# Patient Record
Sex: Male | Born: 1935 | Race: White | Hispanic: No | Marital: Married | Smoking: Former smoker
Health system: Southern US, Community
[De-identification: ages and names within clinical notes are randomized; demographics above are authoritative.]

## PROBLEM LIST (undated history)

## (undated) DIAGNOSIS — I10 Essential (primary) hypertension: Secondary | ICD-10-CM

## (undated) DIAGNOSIS — M1990 Unspecified osteoarthritis, unspecified site: Secondary | ICD-10-CM

## (undated) DIAGNOSIS — I499 Cardiac arrhythmia, unspecified: Secondary | ICD-10-CM

## (undated) DIAGNOSIS — I718 Aortic aneurysm of unspecified site, ruptured: Secondary | ICD-10-CM

## (undated) HISTORY — PX: OTHER SURGICAL HISTORY: SHX169

---

## 2021-08-22 ENCOUNTER — Emergency Department (HOSPITAL_COMMUNITY): Payer: Medicare Other

## 2021-08-22 ENCOUNTER — Inpatient Hospital Stay (HOSPITAL_COMMUNITY)
Admission: EM | Admit: 2021-08-22 | Discharge: 2021-08-24 | DRG: 065 | Disposition: A | Discharge status: Home Health | Payer: Medicare Other | Attending: Family Medicine | Admitting: Family Medicine

## 2021-08-22 ENCOUNTER — Ambulatory Visit (HOSPITAL_COMMUNITY)
Admission: EM | Admit: 2021-08-22 | Discharge: 2021-08-22 | Disposition: A | Discharge status: Home / Self Care | Payer: Medicare Other | Attending: Family Medicine | Admitting: Family Medicine

## 2021-08-22 ENCOUNTER — Encounter (HOSPITAL_COMMUNITY): Payer: Self-pay | Admitting: Emergency Medicine

## 2021-08-22 ENCOUNTER — Other Ambulatory Visit: Payer: Self-pay

## 2021-08-22 DIAGNOSIS — I6340 Cerebral infarction due to embolism of unspecified cerebral artery: Principal | ICD-10-CM | POA: Diagnosis present

## 2021-08-22 DIAGNOSIS — I10 Essential (primary) hypertension: Secondary | ICD-10-CM | POA: Diagnosis present

## 2021-08-22 DIAGNOSIS — E785 Hyperlipidemia, unspecified: Secondary | ICD-10-CM | POA: Diagnosis present

## 2021-08-22 DIAGNOSIS — R29701 NIHSS score 1: Secondary | ICD-10-CM | POA: Diagnosis present

## 2021-08-22 DIAGNOSIS — Z8673 Personal history of transient ischemic attack (TIA), and cerebral infarction without residual deficits: Secondary | ICD-10-CM

## 2021-08-22 DIAGNOSIS — Z7901 Long term (current) use of anticoagulants: Secondary | ICD-10-CM

## 2021-08-22 DIAGNOSIS — N400 Enlarged prostate without lower urinary tract symptoms: Secondary | ICD-10-CM | POA: Diagnosis present

## 2021-08-22 DIAGNOSIS — Z886 Allergy status to analgesic agent status: Secondary | ICD-10-CM

## 2021-08-22 DIAGNOSIS — I639 Cerebral infarction, unspecified: Secondary | ICD-10-CM | POA: Diagnosis present

## 2021-08-22 DIAGNOSIS — R479 Unspecified speech disturbances: Secondary | ICD-10-CM | POA: Diagnosis not present

## 2021-08-22 DIAGNOSIS — Z9109 Other allergy status, other than to drugs and biological substances: Secondary | ICD-10-CM

## 2021-08-22 DIAGNOSIS — Y92009 Unspecified place in unspecified non-institutional (private) residence as the place of occurrence of the external cause: Secondary | ICD-10-CM

## 2021-08-22 DIAGNOSIS — W010XXA Fall on same level from slipping, tripping and stumbling without subsequent striking against object, initial encounter: Secondary | ICD-10-CM | POA: Diagnosis present

## 2021-08-22 DIAGNOSIS — G252 Other specified forms of tremor: Secondary | ICD-10-CM | POA: Diagnosis present

## 2021-08-22 DIAGNOSIS — I480 Paroxysmal atrial fibrillation: Secondary | ICD-10-CM | POA: Diagnosis present

## 2021-08-22 DIAGNOSIS — R4701 Aphasia: Secondary | ICD-10-CM | POA: Diagnosis present

## 2021-08-22 DIAGNOSIS — S0990XA Unspecified injury of head, initial encounter: Secondary | ICD-10-CM

## 2021-08-22 DIAGNOSIS — Z8679 Personal history of other diseases of the circulatory system: Secondary | ICD-10-CM

## 2021-08-22 DIAGNOSIS — H02401 Unspecified ptosis of right eyelid: Secondary | ICD-10-CM | POA: Diagnosis present

## 2021-08-22 DIAGNOSIS — M170 Bilateral primary osteoarthritis of knee: Secondary | ICD-10-CM | POA: Diagnosis present

## 2021-08-22 DIAGNOSIS — G8191 Hemiplegia, unspecified affecting right dominant side: Secondary | ICD-10-CM | POA: Diagnosis present

## 2021-08-22 DIAGNOSIS — R296 Repeated falls: Secondary | ICD-10-CM | POA: Diagnosis present

## 2021-08-22 DIAGNOSIS — Z79899 Other long term (current) drug therapy: Secondary | ICD-10-CM

## 2021-08-22 HISTORY — DX: Aortic aneurysm of unspecified site, ruptured: I71.8

## 2021-08-22 HISTORY — DX: Unspecified osteoarthritis, unspecified site: M19.90

## 2021-08-22 HISTORY — DX: Essential (primary) hypertension: I10

## 2021-08-22 HISTORY — DX: Cardiac arrhythmia, unspecified: I49.9

## 2021-08-22 LAB — CBC WITH DIFFERENTIAL/PLATELET
Abs Immature Granulocytes: 0.03 10*3/uL (ref 0.00–0.07)
Basophils Absolute: 0.1 10*3/uL (ref 0.0–0.1)
Basophils Relative: 1 %
Eosinophils Absolute: 0.2 10*3/uL (ref 0.0–0.5)
Eosinophils Relative: 3 %
HCT: 39.8 % (ref 39.0–52.0)
Hemoglobin: 13.1 g/dL (ref 13.0–17.0)
Immature Granulocytes: 1 %
Lymphocytes Relative: 16 %
Lymphs Abs: 1.1 10*3/uL (ref 0.7–4.0)
MCH: 31.9 pg (ref 26.0–34.0)
MCHC: 32.9 g/dL (ref 30.0–36.0)
MCV: 96.8 fL (ref 80.0–100.0)
Monocytes Absolute: 0.6 10*3/uL (ref 0.1–1.0)
Monocytes Relative: 9 %
Neutro Abs: 4.6 10*3/uL (ref 1.7–7.7)
Neutrophils Relative %: 70 %
Platelets: 192 10*3/uL (ref 150–400)
RBC: 4.11 MIL/uL — ABNORMAL LOW (ref 4.22–5.81)
RDW: 13.1 % (ref 11.5–15.5)
WBC: 6.5 10*3/uL (ref 4.0–10.5)
nRBC: 0 % (ref 0.0–0.2)

## 2021-08-22 LAB — BASIC METABOLIC PANEL
Anion gap: 11 (ref 5–15)
BUN: 27 mg/dL — ABNORMAL HIGH (ref 8–23)
CO2: 27 mmol/L (ref 22–32)
Calcium: 10.8 mg/dL — ABNORMAL HIGH (ref 8.9–10.3)
Chloride: 102 mmol/L (ref 98–111)
Creatinine, Ser: 1.23 mg/dL (ref 0.61–1.24)
GFR, Estimated: 55 mL/min — ABNORMAL LOW (ref >60)
Glucose, Bld: 104 mg/dL — ABNORMAL HIGH (ref 70–99)
Potassium: 4.7 mmol/L (ref 3.5–5.1)
Sodium: 140 mmol/L (ref 135–145)

## 2021-08-22 NOTE — ED Notes (Signed)
Patient is being discharged from the Urgent Care and sent to the Emergency Department via wheelchair . Per Dr Tracie Harrier, patient is in need of higher level of care due to neuro symptoms. Patient is aware and verbalizes understanding of plan of care.  ?Vitals:  ? 08/22/21 1500  ?BP: (!) 129/53  ?Pulse: (!) 58  ?Resp: 18  ?Temp: (!) 97.5 ?F (36.4 ?C)  ?SpO2: 100%  ?  ?

## 2021-08-22 NOTE — ED Triage Notes (Signed)
Pt just moved here from [name] and does not have complete medical records as of yet.  Does take a pill for "fluid" and takes xarelto.  Had a fall approximately 5 days ago hitting head and 3 days ago began having expressive aphasia per family.   ?

## 2021-08-22 NOTE — ED Provider Notes (Signed)
?[facility] ?Provider Note ? ? ?CSN: [id number] ?Arrival date & time: 08/22/21  1533 ? ?  ? ?History ? ?Chief Complaint  ?Patient presents with  ? Fall  ? ? ?Zachary Underwood is a 88 y.o. male. ? ?88 yo M with a cc of episodes where he speaks gibberish and has some difficulty with coordination with his right hand.  This been going on for about 4 to 5 days now.  Has had multiple episodes.  His wife thinks that it is due to increased stress and states that usually it happens when he is in a stressful moment.  The difficulty with speech usually lasts for about a minute or so and then resolves.  He is also noticed that he has trouble with right hand coordination that last a bit longer has some trouble with gripping things and is dropped a few things.  Had an episode the day after the fall and then since then has had them a little bit more frequently.  Not having any symptoms currently. ? ? ?Fall ? ? ?  ? ?Home Medications ?Prior to Admission medications   ?Medication Sig Start Date End Date Taking? Authorizing Provider  ?CALCIUM PO Take 300 mg by mouth daily.   Yes [provider]  ?diazepam (VALIUM) 5 MG tablet Take 2.5 mg by mouth daily.   Yes [provider]  ?Ferrous Sulfate (IRON PO) Take 25 mg by mouth daily.   Yes [provider]  ?ibuprofen (ADVIL) 200 MG tablet Take 200 mg by mouth every 6 (six) hours as needed for mild pain (or "to thin the blood").   Yes [provider]  ?metolazone (ZAROXOLYN) 2.5 MG tablet Take 2.5 mg by mouth daily.   Yes [provider]  ?OVER THE COUNTER MEDICATION Take 5 mg by mouth See admin instructions. Stator 5 mg - Take 5 mg by mouth at bedtime   Yes [provider]  ?OVER THE COUNTER MEDICATION Take 8 mg by mouth See admin instructions. Candor 8 mg- Take 8 mg by mouth once a day   Yes [provider]  ?OVER THE COUNTER MEDICATION Take 10 mg by mouth See admin instructions. Lercapress  10 mg- Take 10 mg by mouth once a day   Yes [provider]  ?tamsulosin (FLOMAX) 0.4 MG CAPS capsule Take 0.4 mg by mouth at bedtime.   Yes [provider]  ?XARELTO 15 MG TABS tablet Take 15 mg by mouth in the morning.   Yes [provider]  ?   ? ?Allergies    ?Other and Tylenol [acetaminophen]   ? ?Review of Systems   ?Review of Systems ? ?Physical Exam ?Updated Vital Signs ?BP (!) 147/72   Pulse (!) 45   Temp 98 ?F (36.7 ?C) (Oral)   Resp 16   SpO2 97%  ?Physical Exam ?Vitals and nursing note reviewed.  ?Constitutional:   ?   Appearance: He is well-developed.  ?HENT:  ?   Head: Normocephalic and atraumatic.  ?Eyes:  ?   Pupils: Pupils are equal, round, and reactive to light.  ?Neck:  ?   Vascular: No JVD.  ?Cardiovascular:  ?   Rate and Rhythm: Normal rate and regular rhythm.  ?   Heart sounds: No murmur heard. ?  No friction rub. No gallop.  ?Pulmonary:  ?   Effort: No respiratory distress.  ?   Breath sounds: No wheezing.  ?Abdominal:  ?   General: There  is no distension.  ?   Tenderness: There is no abdominal tenderness. There is no guarding or rebound.  ?Musculoskeletal:     ?   General: Normal range of motion.  ?   Cervical back: Normal range of motion and neck supple.  ?Skin: ?   Coloration: Skin is not pale.  ?   Findings: No rash.  ?Neurological:  ?   Mental Status: He is alert and oriented to person, place, and time.  ?   GCS: GCS eye subscore is 4. GCS verbal subscore is 5. GCS motor subscore is 6.  ?   Sensory: Sensation is intact.  ?   Motor: Motor function is intact.  ?   Coordination: Coordination is intact.  ?   Comments: Anisocoria.  Left lower extremity weakness compared to right likely secondary due to pain.  Otherwise benign neurologic exam.  ?Psychiatric:     ?   Behavior: Behavior normal.  ? ? ?ED Results / Procedures / Treatments   ?Labs ?(all labs ordered are listed, but only abnormal results are displayed) ?Labs Reviewed  ?BASIC METABOLIC PANEL - Abnormal;  Notable for the following components:  ?    Result Value  ? Glucose, Bld 104 (*)   ? BUN 27 (*)   ? Calcium 10.8 (*)   ? GFR, Estimated 55 (*)   ? All other components within normal limits  ?CBC WITH DIFFERENTIAL/PLATELET - Abnormal; Notable for the following components:  ? RBC 4.11 (*)   ? All other components within normal limits  ? ? ?EKG ?None ? ?Radiology ?CT Head Wo Contrast ? ?Result Date: 08/22/2021 ?CLINICAL DATA:  [place] 5 days ago, anticoagulated, altered level of consciousness EXAM: CT HEAD WITHOUT CONTRAST TECHNIQUE: Contiguous axial images were obtained from the base of the skull through the vertex without intravenous contrast. RADIATION DOSE REDUCTION: This exam was performed according to the departmental dose-optimization program which includes automated exposure control, adjustment of the mA and/or kV according to patient size and/or use of iterative reconstruction technique. COMPARISON:  None Available. FINDINGS: Brain: No acute infarct or hemorrhage. Encephalomalacia left cerebellar hemisphere consistent with prior infarct. Lateral ventricles and midline structures are unremarkable. No acute extra-axial fluid collections. No mass effect. Diffuse cerebral atrophy is age appropriate. Vascular: No hyperdense vessel or unexpected calcification. Skull: Normal. Negative for fracture or focal lesion. Sinuses/Orbits: No acute finding. Other: None. IMPRESSION: 1. No acute intracranial process. Electronically Signed   By: Randa Ngo M.D.   On: 08/22/2021 17:00  ? ?MR BRAIN WO CONTRAST ? ?Result Date: 08/22/2021 ?CLINICAL DATA:  Fall.  Altered mental status. EXAM: MRI HEAD WITHOUT CONTRAST TECHNIQUE: Multiplanar, multiecho pulse sequences of the brain and surrounding structures were obtained without intravenous contrast. COMPARISON:  None Available. FINDINGS: Brain: Multiple small foci of acute/early subacute ischemia within the anterior left parietal lobe. There is an old left cerebellar infarct. No acute or  chronic hemorrhage. Normal white matter signal. Generalized volume loss. The midline structures are normal. Vascular: Major flow voids are preserved. Skull and upper cervical spine: Normal calvarium and skull base. Visualized upper cervical spine and soft tissues are normal. Sinuses/Orbits:No paranasal sinus fluid levels or advanced mucosal thickening. No mastoid or middle ear effusion. Normal orbits. IMPRESSION: 1. Multiple small foci of acute/early subacute ischemia within the anterior left parietal lobe. No hemorrhage or mass effect. 2. Old left cerebellar infarct. Electronically Signed   By: Ulyses Jarred M.D.   On: 08/22/2021 21:50   ? ?Procedures ?Procedures  ? ? ?  Medications Ordered in ED ?Medications - No data to display ? ?ED Course/ Medical Decision Making/ A&P ?  ?                        ?Medical Decision Making ?Amount and/or Complexity of Data Reviewed ?Radiology: ordered. ? ?Risk ?Decision regarding hospitalization. ? ? ?88 yo M with a chief complaints of difficulty speaking and right upper extremity coordination difficulties.  This has been off and on since he fell about 5 days ago.  Basic blood work performed here without significant anemia no significant electrolyte abnormality.  CT of the head negative for acute intracranial hemorrhage. ? ?Patient is recently moved from [name].  Has a history of hypertension hyperlipidemia.  Has a history of A-fib is on Xarelto.  He has had an echocardiogram reportedly recently that was unremarkable. ? ?His story is somewhat concerning for TIA.  I discussed case with Dr.  Leonel Ramsay, neurology he recommended a CT angiogram of the head and neck and an MRI of the brain.  If these are negative for acute pathology then he thought the patient could likely follow-up as an outpatient. ? ?MRI is resulted and shows multiple acute to subacute infarcts. ? ?We will discuss with medicine for admission. ? ?The patients results and plan were reviewed and discussed.   ?Any x-rays  performed were independently reviewed by myself.  ? ?Differential diagnosis were considered with the presenting HPI. ? ?Medications - No data to display ? ?Vitals:  ? 08/22/21 1850 08/22/21 1945 05/02/2

## 2021-08-22 NOTE — ED Provider Triage Note (Signed)
Emergency Medicine Provider Triage Evaluation Note ? ?Zachary Underwood , a 88 y.o. male  was evaluated in triage.  Family member.  Pt complains of recent fall 7 days ago, hit head, no LOC.  Intermittent expressive aphasia starting 2 days after the fall.  No other neurodeficits or complaints at this time.  States patient understands he is experiencing the aphasia and becomes frustrated.  Patient's family member notes that with administration of small dose of Xanax, the patient's symptoms seem to relieve themselves.  Is concerned about a intracranial bleed.  Denies fever, facial asymmetry, abnormal eye movements.  Notes chronic disequilibrium. ? ?Review of Systems  ?Positive: As above ?Negative: As above ? ?Physical Exam  ?BP 126/75 (BP Location: Right Arm)   Pulse (!) 51   Temp 98 ?F (36.7 ?C) (Oral)   Resp 14   SpO2 98%  ?Gen:   Awake, no distress   ?Resp:  Normal effort CTAB ?MSK:   Moves extremities without difficulty ?Other:  Strength, coordination, sensation appears grossly intact.  Vision grossly aligned appropriately.  Radial and PT pulses 2+ bilaterally.  Overall neuro exam unremarkable.  No facial asymmetry.  No expressive aphasia. ? ?Medical Decision Making  ?Medically screening exam initiated at 3:57 PM.  Appropriate orders placed.  Zachary Underwood was informed that the remainder of the evaluation will be completed by another provider, this initial triage assessment does not replace that evaluation, and the importance of remaining in the ED until their evaluation is complete. ? ?Labs, imaging ordered ?  ?Prince Rome, PA-C ?[id number] ? ?

## 2021-08-22 NOTE — ED Provider Notes (Addendum)
?  [facility] ? ? ?[id number] ?08/22/21 Arrival Time: [id number] ? ?ASSESSMENT & PLAN: ? ?1. Speech disturbance, unspecified type   ?2. Acute head injury without loss of consciousness, initial encounter   ? ?Cannot r/o intracranial insult here. Speech is normal here. Discussed. Agrees to ED evaluation. By private vehicle. Stable at d/c. ? ?Reviewed expectations re: course of current medical issues. Questions answered. ?Outlined signs and symptoms indicating need for more acute intervention. ?Patient verbalized understanding. ?After Visit Summary given. ? ? ?SUBJECTIVE: ?History from patient and family. ?Zachary Underwood is a 88 y.o. male who reports fall approx 4 d ago; did hit head; witnessed; no LOC. Two days later he and wife report him "speaking gibberish"; transient; no specific aphasia. Lasts sev minutes then resolves. Otherwise no neurological symptoms. No HA or visual changes. Wife reports speech abnormality has become more frequent yest and today. Ambulatory. No extremity sensation changes or weakness. No h/o stroke or TIA. ?Normal PO intake without n/v/d. ? ?Recently moved here from [name]. ? ?Does take "low-dose" Xanax for stress; is out.  ? ?Social History  ? ?Substance and Sexual Activity  ?Alcohol Use Never  ? ?Social History  ? ?Tobacco Use  ?Smoking Status Never  ?Smokeless Tobacco Never  ? ? ? ?OBJECTIVE: ? ?Vitals:  ? 08/22/21 1500  ?BP: (!) 129/53  ?Pulse: (!) 58  ?Resp: 18  ?Temp: (!) 97.5 ?F (36.4 ?C)  ?TempSrc: Oral  ?SpO2: 100%  ?  ?General appearance: alert; no distress ?Eyes: PERRLA; EOMI; conjunctiva normal ?HENT: normocephalic; atraumatic;  oral mucosa normal ?Neck: supple with FROM ?Lungs: unlabored ?Heart: regular ?Extremities: no cyanosis or edema; symmetrical with no gross deformities ?Skin: warm and dry ?Neurologic: normal gait; CT 2-12 grossly intact; normal extremity strength and sensation; speech is clear with slurring of words ?Psychological: alert and cooperative; normal mood and  affect ? ?No Known Allergies ? ?Past Medical History:  ?Diagnosis Date  ? Arthritis   ? Cardiac arrhythmia   ? Hypertension   ? 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 ?Social History  ? ?Socioeconomic History  ? Marital status: Married  ?  Spouse name: Not on file  ? Number of children: Not on file  ? Years of education: Not on file  ? Highest education level: Not on file  ?Occupational History  ? Not on file  ?Tobacco Use  ? Smoking status: Never  ? Smokeless tobacco: Never  ?Vaping Use  ? Vaping Use: Never used  ?Substance and Sexual Activity  ? Alcohol use: Never  ? Drug use: Never  ? Sexual activity: Not on file  ?Other Topics Concern  ? Not on file  ?Social History Narrative  ? Not on file  ? ?Social Determinants of Health  ? ?Financial Resource Strain: Not on file  ?Food Insecurity: Not on file  ?Transportation Needs: Not on file  ?Physical Activity: Not on file  ?Stress: Not on file  ?Social Connections: Not on file  ?Intimate Partner Violence: Not on file  ? ?History reviewed. No pertinent family history. ?Past Surgical History:  ?Procedure Laterality Date  ? open heart surgery    ? ruptured aortic aneurysm repair    ? ? ? ?  ?Vanessa Kick, MD ?08/22/21 1540 ? ?  ?Vanessa Kick, MD ?08/22/21 1542 ? ?

## 2021-08-22 NOTE — ED Triage Notes (Addendum)
Onset 4 days ago of noticing he is speaking "gibberish".  Patient states he is fully aware when he is doing this.  Patient has moved here approximately one week ago.  Have not secured pcp in area as of yet.  Patient has noticed he has been dropping items recently.  Feeling weaker than usual ? ?Patient had a fall one week ago.  Family member rep[orts patient did hit his head.  Patient is on blood thinners-xarelto ?

## 2021-08-22 NOTE — H&P (Addendum)
Family Medicine Teaching Service ?Hospital Admission History and Physical ?Service Pager: [phone] ? ?Patient name: Zachary Underwood Medical record number: [id number] ?Date of birth: May 10, 1927 Age: 88 y.o. Gender: male ? ?Primary Care Provider: Pcp, No ?Consultants: Neurology ?Code Status: Full code ?Preferred Emergency Contact: spouse, Dr. Mikeal Hawthorne, [phone]  ? ?Chief Complaint: aphasia and right hand weakness ? ?Zachary Underwood is a 88 y.o. male presenting with aphasia and right hand weakness, found to have acute/subacute strokes. PMH is significant for atrial fibrillation, h/o AAA s/p repair, osteoarthritis, HTN, HLD, and possibly CHF. ? ?Acute/ Subacute Strokes ?Patient presents with aphasia and weakness that began after fall at home. On admission, labs were unremarkable with a GFR 55, BUN of 27, WBC 6.5, Hgb 13.1. Patient had CT head that showed no acute intracranial process. MRI brain showing multiple small foci of acute/early subacute ischemia within the anterior left parietal lobe, old left cerebellar infarct, and no hemorrhage or mass effect. CT angiogram showed no intracranial arterial occlusion or high-grade stenosis, with bilateral carotid bifurcation atherosclerosis resulting in 50 to 65% stenosis of the proximal internal carotid arteries bilaterally.  On physical exam patient had mild right eye ptosis, reported as chronic, intact sensation and strength grossly, with no focal neurological deficits appreciated.  Given presentation and MRI findings patient has suffered a stroke.  Etiology of stroke unknown at this time, though he does have risk factors such as a h/o HLD, HTN, and atrial fibrillation, though on full dose anticoagulation.  Neurology consulted in the ED and following. ?- Admit to FPTS, attending Dr. Sherren Mocha McDiarmid ?- Neurology following appreciate recs ?- Vitals per unit ?- Neurochecks every 4 hours ?- Continuous cardiac monitoring ?- AM TSH, A1c, lipid panel,  BMP ?- Echocardiogram ?- EKG ?- Fall precautions ?- Up with assistance ?- PT, OT, SLP ?- Diet vegetarian or Kosher ?- DVT ppx with full dose anticoagulation xarelto ? ?Atrial fibrillation ?Patient with history of atrial fibrillation, treated with Xarelto.  Patient heart rate normal and regular on exam, low concern for A-fib at this time. Patient reports that he has not missed any doses of xarelto. We will continue home meds. ?- Continue Xarelto  ?- AM EKG ? ?Possible Heart Failure ?Patient with history of hospitalization for pitting leg edema and follows with cardiology. History from patient unclear, but appears as though patient may have heart failure, as at one point he took Lasix equivalent diuretic and is now taking metolazone. Patient has notable 3/6 murmur in RUSB with crescendo, concern for AS, though reports no prodrome or dizziness with exertion.  ?- Echocardiogram ?- Continue metolazone ?- Continuous cardiac monitoring ? ?HTN, H/o AAA ?Patient with hx of ruptured AAA in 2011, and HTN. Patient taking calcium channel blocker called lercanidipine, ARB called candesartan, and combined ACE inhibitor/statin called stator.  Will consider starting hospital formulary alternatives, with CCB, and either ACE or ARB. Will allow for permissive hypertension at this time and re-evaluate the use of home antihypertensives later.  ?-Consider starting formulary alternative to home meds ?-Continue to monitor BP ? ?HLD ?Patient on combined statin/ACE for home med. Unsure ASCVD risk, will obtain lipid panel ?-Lipid panel ?-Consider statin formulary alternative ? ?BPH ?Unclear medical history, however patient taking Flomax as home med. ?-Continue Flomax ? ?OA ?Patient with hx of OA in right knee. Will continue to monitor.  ? ?FEN/GI: Vegetarian diet ?Prophylaxis: xarelto full anticoagulation ? ?Disposition: Admit to FPTS, med telemetry ? ?History of Present Illness:  Zachary Remme  Underwood is a 88 y.o. male presenting with aphasia and  right hand weakness, found to have acute/subacute strokes. He is right hand dominant. ? ?6 days ago patient was carrying too many items across the threshold and lost his balance and fell. He denied any dizziness or symptoms prior to fall. He hit his head and injured his left shoulder and right leg, soreness has gone away. Two days after the fall his wife noted he slept a lot. The fourth day he started having aphasia and speaking gibberish for about 10-30 seconds, then was able to speak normally. He was aware his speech was not normal. He was coherent and then began having word searching difficulty again. 10 minutes later he returned to his normal self. On Day 5 (yesterday), he had a repeat of the limited episode of gibberish, he was again aware of the abnormality. He also noticed yesterday that his right hand was weak and he had trouble gripping objects in his right hand. In the last two days he has had bilateral leg weakness that is new that made it difficult for him to climb stairs. He has left knee OA that was noticeable for about the last 6 months. He uses betamethasone cream for his left knee OA.  ? 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 He and his wife moved from [name] to [place] in the last week. They are vegetarian/Kosher. They do not yet have a car, they have an appointment to establish care on May 11th at [facility].  ? ?He is not a smoker, he drinks 1 to 2 sips of wine at shabbas dinner, no recreational drugs. No n/v/d, no chest pain, no fevers.  ? ?Review Of Systems: Per HPI with the following additions:  ? ?Review of Systems  ?Constitutional:  Negative for fever.  ?Cardiovascular:  Negative for chest pain.  ?Gastrointestinal:  Positive for constipation. Negative for diarrhea, nausea and vomiting.  ?All other systems reviewed and are negative.  ? ?There are no problems to display for this patient. ? ? ?Past Medical History: ?Past Medical History:  ?Diagnosis Date   ? Arthritis   ? Cardiac arrhythmia   ? Hypertension   ? 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 ? ?Past Surgical History: ?Past Surgical History:  ?Procedure Laterality Date  ? open heart surgery    ? ruptured aortic aneurysm repair    ? ?Surgery: tonsillectomy, b/l hernia repair, 2011 ruptured AAA,  ?Social History: ?Social History  ? ?Tobacco Use  ? Smoking status: Never  ? Smokeless tobacco: Never  ?Vaping Use  ? Vaping Use: Never used  ?Substance Use Topics  ? Alcohol use: Never  ? Drug use: Never  ? ?Additional social history: see above   ?Please also refer to relevant sections of EMR. ? ?Family History: ?No family history on file. ? ? ?Allergies and Medications: ?Allergies  ?Allergen Reactions  ? Other Other (See Comments) and Hypertension  ?  Fusid- Legs became very swollen  ? Tylenol [Acetaminophen] Hives  ? ?No current facility-administered medications on file prior to encounter.  ? ?Current Outpatient Medications on File Prior to Encounter  ?Medication Sig Dispense Refill  ? CALCIUM PO Take 300 mg by mouth daily.    ? diazepam (VALIUM) 5 MG tablet Take 2.5 mg by mouth daily.    ? Ferrous Sulfate (IRON PO) Take 25 mg by mouth daily.    ? ibuprofen (ADVIL) 200 MG tablet Take 200 mg by mouth every 6 (  six) hours as needed for mild pain (or "to thin the blood").    ? metolazone (ZAROXOLYN) 2.5 MG tablet Take 2.5 mg by mouth daily.    ? OVER THE COUNTER MEDICATION Take 5 mg by mouth See admin instructions. Stator 5 mg - Take 5 mg by mouth at bedtime    ? OVER THE COUNTER MEDICATION Take 8 mg by mouth See admin instructions. Candor 8 mg- Take 8 mg by mouth once a day    ? OVER THE COUNTER MEDICATION Take 10 mg by mouth See admin instructions. Lercapress 10 mg- Take 10 mg by mouth once a day    ? tamsulosin (FLOMAX) 0.4 MG CAPS capsule Take 0.4 mg by mouth at bedtime.    ? XARELTO 15 MG TABS tablet Take 15 mg by mouth in the morning.    ? ? ?Objective: ?BP (!) 139/58   Pulse 75   Temp 98 ?F (36.7 ?C) (Oral)   Resp  13   SpO2 100%  ?Exam: ?General: Elderly, frail, polite, NAD, white male ?Eyes: EOMI, clear conjunctivo- ?ENTM: MMM ?Neck: Soft, no cervical lymphadenopathy ?Cardiovascular: Regular rate, 3 out of 6 syst

## 2021-08-23 ENCOUNTER — Emergency Department (HOSPITAL_COMMUNITY): Payer: Medicare Other

## 2021-08-23 ENCOUNTER — Observation Stay (HOSPITAL_BASED_OUTPATIENT_CLINIC_OR_DEPARTMENT_OTHER): Payer: Medicare Other

## 2021-08-23 ENCOUNTER — Encounter (HOSPITAL_COMMUNITY): Payer: Self-pay | Admitting: Family Medicine

## 2021-08-23 DIAGNOSIS — I6389 Other cerebral infarction: Secondary | ICD-10-CM | POA: Diagnosis not present

## 2021-08-23 DIAGNOSIS — I6340 Cerebral infarction due to embolism of unspecified cerebral artery: Secondary | ICD-10-CM

## 2021-08-23 DIAGNOSIS — I4891 Unspecified atrial fibrillation: Secondary | ICD-10-CM | POA: Diagnosis not present

## 2021-08-23 DIAGNOSIS — I639 Cerebral infarction, unspecified: Secondary | ICD-10-CM | POA: Diagnosis not present

## 2021-08-23 DIAGNOSIS — I700 Atherosclerosis of aorta: Secondary | ICD-10-CM | POA: Insufficient documentation

## 2021-08-23 DIAGNOSIS — I4819 Other persistent atrial fibrillation: Secondary | ICD-10-CM | POA: Insufficient documentation

## 2021-08-23 HISTORY — DX: Cerebral infarction, unspecified: I63.9

## 2021-08-23 LAB — BASIC METABOLIC PANEL
Anion gap: 10 (ref 5–15)
BUN: 25 mg/dL — ABNORMAL HIGH (ref 8–23)
CO2: 24 mmol/L (ref 22–32)
Calcium: 10 mg/dL (ref 8.9–10.3)
Chloride: 102 mmol/L (ref 98–111)
Creatinine, Ser: 1.22 mg/dL (ref 0.61–1.24)
GFR, Estimated: 56 mL/min — ABNORMAL LOW (ref >60)
Glucose, Bld: 98 mg/dL (ref 70–99)
Potassium: 3.5 mmol/L (ref 3.5–5.1)
Sodium: 136 mmol/L (ref 135–145)

## 2021-08-23 LAB — LIPID PANEL
Cholesterol: 110 mg/dL (ref 0–200)
HDL: 36 mg/dL — ABNORMAL LOW (ref >40)
LDL Cholesterol: 54 mg/dL (ref 0–99)
Total CHOL/HDL Ratio: 3.1 RATIO
Triglycerides: 100 mg/dL (ref <150)
VLDL: 20 mg/dL (ref 0–40)

## 2021-08-23 LAB — ECHOCARDIOGRAM COMPLETE
AR max vel: 2.3 cm2
AV Area VTI: 2.25 cm2
AV Area mean vel: 2.09 cm2
AV Mean grad: 21.6 mmHg
AV Peak grad: 40.6 mmHg
Ao pk vel: 3.19 m/s
Area-P 1/2: 3.03 cm2
Calc EF: 76.7 %
MV VTI: 2.96 cm2
P 1/2 time: 802 msec
S' Lateral: 1.5 cm
Single Plane A2C EF: 68.4 %
Single Plane A4C EF: 80.1 %

## 2021-08-23 LAB — HEMOGLOBIN A1C
Hgb A1c MFr Bld: 5.4 % (ref 4.8–5.6)
Mean Plasma Glucose: 108.28 mg/dL

## 2021-08-23 LAB — TSH: TSH: 3.062 u[IU]/mL (ref 0.350–4.500)

## 2021-08-23 MED ORDER — ATORVASTATIN CALCIUM 40 MG PO TABS
40.0000 mg | ORAL_TABLET | Freq: Every day | ORAL | Status: DC
  Administered 2021-08-23: 40 mg via ORAL
  Filled 2021-08-23: qty 1

## 2021-08-23 MED ORDER — APIXABAN 5 MG PO TABS
5.0000 mg | ORAL_TABLET | Freq: Two times a day (BID) | ORAL | Status: DC
  Administered 2021-08-24: 5 mg via ORAL
  Filled 2021-08-23: qty 1

## 2021-08-23 MED ORDER — METOLAZONE 2.5 MG PO TABS
2.5000 mg | ORAL_TABLET | Freq: Every day | ORAL | Status: DC
  Administered 2021-08-23: 2.5 mg via ORAL
  Filled 2021-08-23: qty 1

## 2021-08-23 MED ORDER — IOHEXOL 350 MG/ML SOLN
75.0000 mL | Freq: Once | INTRAVENOUS | Status: AC | PRN
  Administered 2021-08-23: 75 mL via INTRAVENOUS

## 2021-08-23 MED ORDER — TAMSULOSIN HCL 0.4 MG PO CAPS
0.4000 mg | ORAL_CAPSULE | Freq: Every day | ORAL | Status: DC
  Administered 2021-08-23: 0.4 mg via ORAL
  Filled 2021-08-23: qty 1

## 2021-08-23 MED ORDER — DIAZEPAM 2 MG PO TABS: 2.0000 mg | ORAL_TABLET | Freq: Every evening | ORAL | Status: DC | PRN

## 2021-08-23 MED ORDER — RIVAROXABAN 15 MG PO TABS
15.0000 mg | ORAL_TABLET | Freq: Every day | ORAL | Status: DC
  Administered 2021-08-23: 15 mg via ORAL
  Filled 2021-08-23 (×2): qty 1

## 2021-08-23 MED ORDER — DIAZEPAM 5 MG PO TABS: 2.5000 mg | ORAL_TABLET | Freq: Every day | ORAL | Status: DC

## 2021-08-23 NOTE — Progress Notes (Signed)
STROKE TEAM PROGRESS NOTE  ? ?SUBJECTIVE (INTERVAL HISTORY) ?His male friend is at the bedside.  Overall his condition is completely resolved.  Patient stated that 2 years ago he had 2 episodes of expressive aphasia lasting 5 minutes each.  Yesterday he had again 2 episode of expressive aphasia lasting 45 minutes to 1-hour.  Episodes associate with right hand weakness and clumsiness lasted about the same time period.  Now all resolved.  He takes Xarelto daily without missing doses. ? ? ?OBJECTIVE ?Temp:  [98 ?F (36.7 ?C)-98.2 ?F (36.8 ?C)] 98.2 ?F (36.8 ?C) (05/03 1347) ?Pulse Rate:  [43-83] 61 (05/03 1400) ?Resp:  [11-22] 14 (05/03 1400) ?BP: (109-152)/(48-96) 124/57 (05/03 1400) ?SpO2:  [85 %-100 %] 97 % (05/03 1400) ?Weight:  [83.9 kg] 83.9 kg (05/03 1400) ? ?No results for input(s): GLUCAP in the last 168 hours. ?Recent Labs  ?Lab 08/22/21 ?1616 08/23/21 ?5027  ?NA 140 136  ?K 4.7 3.5  ?CL 102 102  ?CO2 27 24  ?GLUCOSE 104* 98  ?BUN 27* 25*  ?CREATININE 1.23 1.22  ?CALCIUM 10.8* 10.0  ? ?No results for input(s): AST, ALT, ALKPHOS, BILITOT, PROT, ALBUMIN in the last 168 hours. ?Recent Labs  ?Lab 08/22/21 ?1616  ?WBC 6.5  ?NEUTROABS 4.6  ?HGB 13.1  ?HCT 39.8  ?MCV 96.8  ?PLT 192  ? ?No results for input(s): CKTOTAL, CKMB, CKMBINDEX, TROPONINI in the last 168 hours. ?No results for input(s): LABPROT, INR in the last 72 hours. ?No results for input(s): COLORURINE, LABSPEC, PHURINE, GLUCOSEU, HGBUR, BILIRUBINUR, KETONESUR, PROTEINUR, UROBILINOGEN, NITRITE, LEUKOCYTESUR in the last 72 hours. ? ?Invalid input(s): APPERANCEUR  ?   ?Component Value Date/Time  ? CHOL 110 08/23/2021 0552  ? TRIG 100 08/23/2021 0552  ? HDL 36 (L) 08/23/2021 0552  ? CHOLHDL 3.1 08/23/2021 0552  ? VLDL 20 08/23/2021 0552  ? LDLCALC 54 08/23/2021 0552  ? ?Lab Results  ?Component Value Date  ? HGBA1C 5.4 08/23/2021  ? ?No results found for: LABOPIA, COCAINSCRNUR, LABBENZ, AMPHETMU, THCU, LABBARB  ?No results for input(s): ETH in the last 168  hours. ? ?I have personally reviewed the radiological images below and agree with the radiology interpretations. ? ?CT ANGIO HEAD NECK W WO CM ? ?Result Date: 08/23/2021 ?CLINICAL DATA:  Recent fall.  Expressive aphasia. EXAM: CT ANGIOGRAPHY HEAD AND NECK TECHNIQUE: Multidetector CT imaging of the head and neck was performed using the standard protocol during bolus administration of intravenous contrast. Multiplanar CT image reconstructions and MIPs were obtained to evaluate the vascular anatomy. Carotid stenosis measurements (when applicable) are obtained utilizing NASCET criteria, using the distal internal carotid diameter as the denominator. RADIATION DOSE REDUCTION: This exam was performed according to the departmental dose-optimization program which includes automated exposure control, adjustment of the mA and/or kV according to patient size and/or use of iterative reconstruction technique. CONTRAST:  58mL OMNIPAQUE IOHEXOL 350 MG/ML SOLN COMPARISON:  Brain MRI 08/22/2021 FINDINGS: CTA NECK FINDINGS SKELETON: There is no bony spinal canal stenosis. No lytic or blastic lesion. OTHER NECK: Normal pharynx, larynx and major salivary glands. No cervical lymphadenopathy. Unremarkable thyroid gland. UPPER CHEST: No pneumothorax or pleural effusion. No nodules or masses. AORTIC ARCH: There is calcific atherosclerosis of the aortic arch. Ascending thoracic aorta measures 3.9 cm. Conventional 3 vessel aortic branching pattern. The visualized proximal subclavian arteries are widely patent. RIGHT CAROTID SYSTEM: No dissection, occlusion or aneurysm. There is calcified atherosclerosis extending into the proximal ICA, resulting in 50% stenosis. LEFT CAROTID SYSTEM: No dissection, occlusion  or aneurysm. There is calcified atherosclerosis extending into the proximal ICA, resulting in 65% stenosis. VERTEBRAL ARTERIES: Right dominant configuration. Both origins are clearly patent. There is no dissection, occlusion or flow-limiting  stenosis to the skull base (V1-V3 segments). CTA HEAD FINDINGS POSTERIOR CIRCULATION: --Vertebral arteries: Normal V4 segments. --Inferior cerebellar arteries: Normal. --Basilar artery: Normal. --Superior cerebellar arteries: Normal. --Posterior cerebral arteries (PCA): Normal. ANTERIOR CIRCULATION: --Intracranial internal carotid arteries: Atherosclerotic calcification of the internal carotid arteries at the skull base without hemodynamically significant stenosis. --Anterior cerebral arteries (ACA): Normal. Both A1 segments are present. Patent anterior communicating artery (a-comm). --Middle cerebral arteries (MCA): Normal. VENOUS SINUSES: As permitted by contrast timing, patent. ANATOMIC VARIANTS: None Review of the MIP images confirms the above findings. IMPRESSION: 1. No intracranial arterial occlusion or high-grade stenosis. 2. Bilateral carotid bifurcation atherosclerosis resulting in 50-65% stenosis of the proximal internal carotid arteries bilaterally. Aortic Atherosclerosis (ICD10-I70.0). Electronically Signed   By: Deatra Robinson M.D.   On: 08/23/2021 00:51  ? ?CT Head Wo Contrast ? ?Result Date: 08/22/2021 ?CLINICAL DATA:  Larey Seat 5 days ago, anticoagulated, altered level of consciousness EXAM: CT HEAD WITHOUT CONTRAST TECHNIQUE: Contiguous axial images were obtained from the base of the skull through the vertex without intravenous contrast. RADIATION DOSE REDUCTION: This exam was performed according to the departmental dose-optimization program which includes automated exposure control, adjustment of the mA and/or kV according to patient size and/or use of iterative reconstruction technique. COMPARISON:  None Available. FINDINGS: Brain: No acute infarct or hemorrhage. Encephalomalacia left cerebellar hemisphere consistent with prior infarct. Lateral ventricles and midline structures are unremarkable. No acute extra-axial fluid collections. No mass effect. Diffuse cerebral atrophy is age appropriate. Vascular:  No hyperdense vessel or unexpected calcification. Skull: Normal. Negative for fracture or focal lesion. Sinuses/Orbits: No acute finding. Other: None. IMPRESSION: 1. No acute intracranial process. Electronically Signed   By: Sharlet Salina M.D.   On: 08/22/2021 17:00  ? ?MR BRAIN WO CONTRAST ? ?Result Date: 08/22/2021 ?CLINICAL DATA:  Fall.  Altered mental status. EXAM: MRI HEAD WITHOUT CONTRAST TECHNIQUE: Multiplanar, multiecho pulse sequences of the brain and surrounding structures were obtained without intravenous contrast. COMPARISON:  None Available. FINDINGS: Brain: Multiple small foci of acute/early subacute ischemia within the anterior left parietal lobe. There is an old left cerebellar infarct. No acute or chronic hemorrhage. Normal white matter signal. Generalized volume loss. The midline structures are normal. Vascular: Major flow voids are preserved. Skull and upper cervical spine: Normal calvarium and skull base. Visualized upper cervical spine and soft tissues are normal. Sinuses/Orbits:No paranasal sinus fluid levels or advanced mucosal thickening. No mastoid or middle ear effusion. Normal orbits. IMPRESSION: 1. Multiple small foci of acute/early subacute ischemia within the anterior left parietal lobe. No hemorrhage or mass effect. 2. Old left cerebellar infarct. Electronically Signed   By: Deatra Robinson M.D.   On: 08/22/2021 21:50  ? ?ECHOCARDIOGRAM COMPLETE ? ?Result Date: 08/23/2021 ?   ECHOCARDIOGRAM REPORT   Patient Name:   Zachary Underwood Date of Exam: 08/23/2021 Medical Rec #:  [id number]       Height: Accession #:    [id number]      Weight: Date of Birth:  February 03, 1928       BSA: Patient Age:    88 years        BP:           142/96 mmHg Patient Gender: M               HR:  57 bpm. Exam Location:  Inpatient Procedure: 2D Echo, 3D Echo, Color Doppler and Cardiac Doppler Indications:    Stroke  History:        Patient has no prior history of Echocardiogram examinations.                 Abnormal  ECG, Arrythmias:Atrial Fibrillation,                 Signs/Symptoms:Edema; Risk Factors:Hypertension and                 Dyslipidemia. Ruptured AAA.  Sonographer:    Sheralyn Boatmanina West RDCS Referring Phys: [id number]

## 2021-08-23 NOTE — ED Notes (Signed)
Called 3W to give report, no answer. Will call back in 5 min  ?

## 2021-08-23 NOTE — Progress Notes (Signed)
?  Echocardiogram ?2D Echocardiogram has been performed. ? ?Zachary Underwood ?08/23/2021, 2:38 PM ?

## 2021-08-23 NOTE — Evaluation (Signed)
Physical Therapy Evaluation ?Patient Details ?Name: Zachary Underwood ?MRN: [id number] ?DOB: [birth year] ?Today's Date: 08/23/2021 ? ?History of Present Illness ? KRISTON BOKER is a 88 y.o. male presenting with aphasia and right hand weakness, found to have acute/subacute strokes who remains hemodynamically stable without any residual deficits. PMH is significant for atrial fibrillation, h/o AAA s/p repair, osteoarthritis, HTN, HLD, and possibly CHF.  ?Clinical Impression ? Pt was seen for progressing gait from room to the hallway, assessing his use of SPC after mult recent falls.  Pt is tending to let [facility] lag behind him, and may be more benefited by using rollator for mobility.  Will reassess gait tomorrow with both devices, and will practice stairs for entering the home to leave probably tomorrow.  Follow up for goals of acute PT and instruct pt on best version for safety and comfort.  Pt is home with his wife, who will be able to assist as needed.  Recommend HHPT for evaluation of home and to work on [employer] of stairs in and out of the house.   ?   ? ?Recommendations for follow up therapy are one component of a multi-disciplinary discharge planning process, led by the attending physician.  Recommendations may be updated based on patient status, additional functional criteria and insurance authorization. ? ?Follow Up Recommendations Home health PT ? ?  ?Assistance Recommended at Discharge Intermittent Supervision/Assistance  ?Patient can return home with the following ? A little help with walking and/or transfers;A little help with bathing/dressing/bathroom;Assistance with cooking/housework;Assist for transportation;Help with stairs or ramp for entrance ? ?  ?Equipment Recommendations Rollator (4 wheels)  ?Recommendations for Other Services ?    ?  ?Functional Status Assessment Patient has had a recent decline in their functional status and demonstrates the ability to make significant improvements in function in  a reasonable and predictable amount of time.  ? ?  ?Precautions / Restrictions Precautions ?Precautions: Fall ?Precaution Comments: has SPC now ?Restrictions ?Weight Bearing Restrictions: No  ? ?  ? ?Mobility ? Bed Mobility ?Overal bed mobility: Needs Assistance ?Bed Mobility: Supine to Sit, Sit to Supine ?  ?  ?Supine to sit: Min assist ?Sit to supine: Min guard ?  ?  ?  ? ?Transfers ?Overall transfer level: Needs assistance ?Equipment used: Straight cane ?Transfers: Sit to/from Stand ?Sit to Stand: Min assist ?  ?  ?  ?  ?  ?General transfer comment: pt is walking and able to scoot back to gurney without help ?  ? ?Ambulation/Gait ?Ambulation/Gait assistance: Min guard ?Gait Distance (Feet): 120 Feet ?Assistive device: 1 person hand held assist, Straight cane ?Gait Pattern/deviations: Step-through pattern, Wide base of support, Decreased stride length ?Gait velocity: reduced ?Gait velocity interpretation: <1.31 ft/sec, indicative of household ambulator ?Pre-gait activities: standing balance ck ?General Gait Details: pt is somewhat minimizing WB on L knee but is more malaligned than R Knee ? ?Stairs ?  ?  ?  ?  ?  ? ?Wheelchair Mobility ?  ? ?Modified Rankin (Stroke Patients Only) ?  ? ?  ? ?Balance Overall balance assessment: Needs assistance ?Sitting-balance support: Feet unsupported ?Sitting balance-Leahy Scale: Good ?  ?  ?Standing balance support: Single extremity supported, During functional activity ?Standing balance-Leahy Scale: Fair ?Standing balance comment: pt is barely using SPC to walk, tends to lag behind him ?  ?  ?  ?  ?  ?  ?  ?  ?  ?  ?  ?   ? ? ? ?Pertinent  Vitals/Pain Pain Assessment ?Pain Assessment: No/denies pain  ? ? ?Home Living Family/patient expects to be discharged to:: Private residence ?Living Arrangements: Spouse/significant other ?Available Help at Discharge: Available 24 hours/day ?Type of Home: House ?Home Access: Stairs to enter ?Entrance Stairs-Rails: None ?Entrance Stairs-Number  of Steps: 2 from garage ?Alternate Level Stairs-Number of Steps: 16 ?Home Layout: Two level;1/2 bath on main level;Bed/bath upstairs ?Home Equipment: Gilmer MorCane - single point ?Additional Comments: recent use of AD  ?  ?Prior Function Prior Level of Function : Independent/Modified Independent ?  ?  ?  ?  ?  ?  ?Mobility Comments: SPC was assist on steps ?  ?  ? ? ?Hand Dominance  ? Dominant Hand: Right ? ?  ?Extremity/Trunk Assessment  ? Upper Extremity Assessment ?Upper Extremity Assessment: Defer to OT evaluation ?  ? ?Lower Extremity Assessment ?Lower Extremity Assessment: Generalized weakness (specifically knees with OA changes of joint alignment) ?  ? ?Cervical / Trunk Assessment ?Cervical / Trunk Assessment: Kyphotic  ?Communication  ? Communication: HOH  ?Cognition Arousal/Alertness: Awake/alert ?Behavior [facility]: [facility] for tasks assessed/performed ?Overall Cognitive Status: Within Functional Limits for tasks assessed ?  ?  ?  ?  ?  ?  ?  ?  ?  ?  ?  ?  ?  ?  ?  ?  ?  ?  ?  ? ?  ?General Comments General comments (skin integrity, edema, etc.): pt is maybe more suited to a rollator due to his joint issues, not painful but contributing to limited mobility ? ?  ?Exercises    ? ?Assessment/Plan  ?  ?PT Assessment Patient needs continued PT services  ?PT Problem List Decreased strength;Decreased activity tolerance;Decreased balance;Decreased coordination;Decreased knowledge of use of DME ? ?   ?  ?PT Treatment Interventions DME instruction;Gait training;Stair training;Functional mobility training;Therapeutic activities;Therapeutic exercise;Balance training;Neuromuscular re-education;Patient/family education   ? ?PT Goals (Current goals can be found in the Care Plan section)  ?Acute Rehab PT Goals ?Patient Stated Goal: to get home and feel better ?PT Goal Formulation: With patient ?Time For Goal Achievement: 08/30/21 ?Potential to Achieve Goals: Good ? ?  ?Frequency Min 3X/week ?  ? ? ?Co-evaluation   ?  ?  ?  ?   ? ? ?  ?AM-PAC PT "6 Clicks" Mobility  ?Outcome Measure Help needed turning from your back to your side while in a flat bed without using bedrails?: None ?Help needed moving from lying on your back to sitting on the side of a flat bed without using bedrails?: A Little ?Help needed moving to and from a bed to a chair (including a wheelchair)?: A Little ?Help needed standing up from a chair using your arms (e.g., wheelchair or bedside chair)?: A Little ?Help needed to walk in hospital room?: A Little ?Help needed climbing 3-5 steps with a railing? : A Little ?6 Click Score: 19 ? ?  ?End of Session Equipment Utilized During Treatment: Gait belt ?Activity Tolerance: Other (comment) (weakness and joint alignment of knees) ?Patient left: in bed;with call bell/phone within reach ?Nurse Communication: Mobility status ?PT Visit Diagnosis: Unsteadiness on feet (R26.81);Muscle weakness (generalized) (M62.81) ?  ? ?Time: [id number] ?PT Time Calculation (min) (ACUTE ONLY): 37 min ? ? ?Charges:   PT Evaluation ?$PT Eval Moderate Complexity: 1 Mod ?PT Treatments ?$Gait Training: 8-22 mins ?  ?   ? ?Ivar Drapeuth E Tawan Corkern ?08/23/2021, 5:52 PM ? ?Samul Dadauth Prezley Qadir, PT PhD ?[facility]hab [facility]. [facility] [phone] and [facility] [phone] ? ?

## 2021-08-23 NOTE — ED Notes (Signed)
Provider at bedside

## 2021-08-23 NOTE — ED Notes (Signed)
Ambulatory with assistance  2 to restroom. Pt refused to use urinal  ?

## 2021-08-23 NOTE — Consult Note (Addendum)
?                    NEURO HOSPITALIST CONSULT NOTE  ? ?Requestig physician: Dr. McDiarmid ? ?Reason for Consult: Transient spells of expressive aphasia ? ?History obtained from:  Patient, Wife and Chart    ? ?HPI:                                                                                                                                         ? Zachary Underwood is an 88 y.o. male with a history of HTN, atrial fibrillation and ruptured aortic aneurysm who presented to the ED yesterday after having 3-4 spells of speaking "gibberish" witnessed by his wife. The patient was aware of his difficulty speaking correctly during the spells. Wife describes it as an expressive aphasia, as patient's understanding of what was being said to him during the spells was completely intact. His first spell occurred on Saturday. Shortest duration of a spell was 5 seconds, with the longest spell lasting 30 seconds. After one of the spells he endorsed new onset of right hand clumsiness. He has not had any vision changes, facial droop, sensory loss or ataxia. Also without headache. He recently moved to the [place] from [name] and has been under a lot of stress due to the move. He also has had recent falls, the last one being one week ago during which he hit his head without LOC. He is on Xarelto for his atrial fibrillation. He also takes Xanax for symptoms of stress. He states that he has no prior history of stroke. He had a recent echocardiogram that was unremarkable. ? ?Past Medical History:  ?Diagnosis Date  ? Arthritis   ? Cardiac arrhythmia   ? Hypertension   ? Ruptured aortic aneurysm ([place])   ? ? ?Past Surgical History:  ?Procedure Laterality Date  ? open heart surgery    ? ruptured aortic aneurysm repair    ? ? ?No family history on file.           ? ?Social History:  reports that he has never smoked. He has never used smokeless tobacco. He reports that he does not drink alcohol and does not use drugs. ? ?Allergies  ?Allergen  Reactions  ? Other Other (See Comments) and Hypertension  ?  Fusid- Legs became very swollen  ? Tylenol [Acetaminophen] Hives  ? ? ?MEDICATIONS:                                                                                                                     ?  No current facility-administered medications on file prior to encounter.  ? ?Current Outpatient Medications on File Prior to Encounter  ?Medication Sig Dispense Refill  ? CALCIUM PO Take 300 mg by mouth daily.    ? diazepam (VALIUM) 5 MG tablet Take 2.5 mg by mouth daily.    ? Ferrous Sulfate (IRON PO) Take 25 mg by mouth daily.    ? ibuprofen (ADVIL) 200 MG tablet Take 200 mg by mouth every 6 (six) hours as needed for mild pain (or "to thin the blood").    ? metolazone (ZAROXOLYN) 2.5 MG tablet Take 2.5 mg by mouth daily.    ? OVER THE COUNTER MEDICATION Take 5 mg by mouth See admin instructions. Stator 5 mg - Take 5 mg by mouth at bedtime    ? OVER THE COUNTER MEDICATION Take 8 mg by mouth See admin instructions. Candor 8 mg- Take 8 mg by mouth once a day    ? OVER THE COUNTER MEDICATION Take 10 mg by mouth See admin instructions. Lercapress 10 mg- Take 10 mg by mouth once a day    ? tamsulosin (FLOMAX) 0.4 MG CAPS capsule Take 0.4 mg by mouth at bedtime.    ? XARELTO 15 MG TABS tablet Take 15 mg by mouth in the morning.    ? ? ? ?ROS:                                                                                                                                       ?Has some chronic right knee pain. Does not endorse any visual changes, sensory loss, motor weakness or ataxia. Does have a gradually worsening problem with gait instability. No CP or SOB. Other ROS as per HPI.  ? ? ?Blood pressure (!) 147/72, pulse (!) 45, temperature 98 ?F (36.7 ?C), temperature source Oral, resp. rate 16, SpO2 97 %. ? ? ?General Examination:                                                                                                      ? ?Physical Exam  ?HEENT-  [place]/AT    ?Lungs- Respirations unlabored ?Extremities- Warm and well perfused ? ?Neurological Examination ?Mental Status: Awake and alert. Speech fluent with intact naming, comprehension and repetition. Did manifest with slight/subtle dysarthria at the end of the exam that then improved. Oriented x 5. Pleasant and cooperative. Good insight. No hemineglect.  ?Cranial Nerves: ?II: Temporal visual fields intact with no extinction to DSS. Right pupil  3 mm >> 2 mm, left pupil 4 mm >> 3 mm.   ?III,IV, VI: EOMI. No nystagmus. Mild right sided ptosis (chronic) ?V: Temp sensation intact bilaterally ?VII: Smile symmetric ?VIII: HOH  ?IX,X: No hoarseness or hypophonia ?XI: Symmetric shoulder shrug ?XII: Midline tongue extension ?Motor: ?Right : Upper extremity   5/5    Left:     Upper extremity   5/5 ? Lower extremity   5/5     Lower extremity   5/5 ?Sensory: Temp and light touch intact throughout, bilaterally. No extinction to DSS.  ?Deep Tendon Reflexes: 2+ and symmetric bilateral brachioradialis and patellae ?Plantars: Right: downgoing   Left: downgoing ?Cerebellar: No ataxia with FNF bilaterally. Subtle intention tremor.  ?Gait: Deferred ? ?NIHSS: 1 ?  ?Lab Results: ?Basic Metabolic Panel: ?Recent Labs  ?Lab 08/22/21 ?1616  ?NA 140  ?K 4.7  ?CL 102  ?CO2 27  ?GLUCOSE 104*  ?BUN 27*  ?CREATININE 1.23  ?CALCIUM 10.8*  ? ? ?CBC: ?Recent Labs  ?Lab 08/22/21 ?1616  ?WBC 6.5  ?NEUTROABS 4.6  ?HGB 13.1  ?HCT 39.8  ?MCV 96.8  ?PLT 192  ? ? ?Cardiac Enzymes: ?No results for input(s): CKTOTAL, CKMB, CKMBINDEX, TROPONINI in the last 168 hours. ? ?Lipid Panel: ?No results for input(s): CHOL, TRIG, HDL, CHOLHDL, VLDL, LDLCALC in the last 168 hours. ? ?Imaging: ?CT Head Wo Contrast ? ?Result Date: 08/22/2021 ?CLINICAL DATA:  [place] 5 days ago, anticoagulated, altered level of consciousness EXAM: CT HEAD WITHOUT CONTRAST TECHNIQUE: Contiguous axial images were obtained from the base of the skull through the vertex without intravenous contrast.  RADIATION DOSE REDUCTION: This exam was performed according to the departmental dose-optimization program which includes automated exposure control, adjustment of the mA and/or kV according to patient size and/or use of iterative reconstruction technique. COMPARISON:  None Available. FINDINGS: Brain: No acute infarct or hemorrhage. Encephalomalacia left cerebellar hemisphere consistent with prior infarct. Lateral ventricles and midline structures are unremarkable. No acute extra-axial fluid collections. No mass effect. Diffuse cerebral atrophy is age appropriate. Vascular: No hyperdense vessel or unexpected calcification. Skull: Normal. Negative for fracture or focal lesion. Sinuses/Orbits: No acute finding. Other: None. IMPRESSION: 1. No acute intracranial process. Electronically Signed   By: Randa Ngo M.D.   On: 08/22/2021 17:00  ? ?MR BRAIN WO CONTRAST ? ?Result Date: 08/22/2021 ?CLINICAL DATA:  Fall.  Altered mental status. EXAM: MRI HEAD WITHOUT CONTRAST TECHNIQUE: Multiplanar, multiecho pulse sequences of the brain and surrounding structures were obtained without intravenous contrast. COMPARISON:  None Available. FINDINGS: Brain: Multiple small foci of acute/early subacute ischemia within the anterior left parietal lobe. There is an old left cerebellar infarct. No acute or chronic hemorrhage. Normal white matter signal. Generalized volume loss. The midline structures are normal. Vascular: Major flow voids are preserved. Skull and upper cervical spine: Normal calvarium and skull base. Visualized upper cervical spine and soft tissues are normal. Sinuses/Orbits:No paranasal sinus fluid levels or advanced mucosal thickening. No mastoid or middle ear effusion. Normal orbits. IMPRESSION: 1. Multiple small foci of acute/early subacute ischemia within the anterior left parietal lobe. No hemorrhage or mass effect. 2. Old left cerebellar infarct. Electronically Signed   By: Ulyses Jarred M.D.   On: 08/22/2021 21:50    ? ? ?Assessment: 88 year old male presenting with spells of expressive aphasia ?- Exam reveals chronic mild right sided ptosis and mild diffuse weakness consistent with the patient's advanced age. No dysphasia or

## 2021-08-23 NOTE — ED Notes (Signed)
Breakfast order placed ?

## 2021-08-23 NOTE — Progress Notes (Signed)
ANTICOAGULATION CONSULT NOTE - Initial Consult ? ?Pharmacy Consult for apixaban ?Indication: atrial fibrillation ? ?Allergies  ?Allergen Reactions  ? Other Other (See Comments) and Hypertension  ?  Fusid- Legs became very swollen  ? Tylenol [Acetaminophen] Hives  ? ? ?Patient Measurements: ?Weight: 83.9 kg (185 lb) (patient reported) ?Heparin Dosing Weight:  ? ?Vital Signs: ?Temp: 98.2 ?F (36.8 ?C) (05/03 1347) ?BP: 124/57 (05/03 1400) ?Pulse Rate: 61 (05/03 1400) ? ?Labs: ?Recent Labs  ?  08/22/21 ?1616 08/23/21 ?0552  ?HGB 13.1  --   ?HCT 39.8  --   ?PLT 192  --   ?CREATININE 1.23 1.22  ? ? ?CrCl cannot be calculated (Unknown ideal weight.). ? ? ?Medical History: ?Past Medical History:  ?Diagnosis Date  ? Arthritis   ? Cardiac arrhythmia   ? Hypertension   ? 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 ?Assessment: ?55 yom presented to the ED with aphasia and weakness. He is on chronic xarelto for afib. Found to have an acute CVA and now switching to apixaban. Pt received his dose of xarelto today.  ? ?Goal of Therapy:  ?Stroke prevention ?Monitor platelets by anticoagulation protocol: Yes ?  ?Plan:  ?Discontinue xarelto ?Start apixaban 5mg  PO BID tomorrow ?F/u renal fxn, S&S of bleeding ? ?Chantele Corado, Rande Lawman ?08/23/2021,2:52 PM ? ? ?

## 2021-08-23 NOTE — Progress Notes (Signed)
PT Cancellation Note ? ?Patient Details ?Name: ZIV WELCHEL ?MRN: [id number] ?DOB: 02-18-1928 ? ? ?Cancelled Treatment:    Reason Eval/Treat Not Completed: Other (comment).  With another therapist, will retry at another time. ? ? ?Ivar Drape ?08/23/2021, 12:03 PM ? ?Samul Dada, PT PhD ?Acute Rehab Dept. Number: [facility] [phone] and [facility] [phone] ? ?

## 2021-08-23 NOTE — Evaluation (Signed)
Occupational Therapy Evaluation ?Patient Details ?Name: Zachary Underwood ?MRN: [id number] ?DOB: [birth year] ?Today's Date: 08/23/2021 ? ? ?History of Present Illness Zachary Huntsmanorman A Lavine is a 88 y.o. male presenting with aphasia and right hand weakness, found to have acute/subacute strokes who remains hemodynamically stable without any residual deficits. PMH is significant for atrial fibrillation, h/o AAA s/p repair, osteoarthritis, HTN, HLD, and possibly CHF.  ? ?Clinical Impression ?  ?Pt overall min assist level for simulated selfcare tasks and functional transfers using his single point cane.  No noted residual deficits from CVA when tested, mostly just orthopedic in his knees and left shoulder.  Feel he will benefit from acute care OT to address current deficits and increase overall independence back to a modified independent level for home.  His wife can provide 24 hr supervision currently as well.   ?   ? ?Recommendations for follow up therapy are one component of a multi-disciplinary discharge planning process, led by the attending physician.  Recommendations may be updated based on patient status, additional functional criteria and insurance authorization.  ? ?Follow Up Recommendations ? Home health OT  ?  ?Assistance Recommended at Discharge Frequent or constant Supervision/Assistance  ?Patient can return home with the following A little help with walking and/or transfers;A little help with bathing/dressing/bathroom;Assist for transportation;Assistance with cooking/housework ? ?  ?Functional Status Assessment ? Patient has had a recent decline in their functional status and demonstrates the ability to make significant improvements in function in a reasonable and predictable amount of time.  ?Equipment Recommendations ? BSC/3in1  ?  ?   ?Precautions / Restrictions Precautions ?Precautions: Fall ?Restrictions ?Weight Bearing Restrictions: No  ? ?  ? ?Mobility Bed Mobility ?Overal bed mobility: Needs Assistance ?Bed  Mobility: Supine to Sit, Sit to Supine ?  ?  ?Supine to sit: Min guard ?Sit to supine: Min guard ?  ?  ?  ? ?Transfers ?Overall transfer level: Needs assistance ?Equipment used: Straight cane ?Transfers: Sit to/from Stand, Bed to chair/wheelchair/BSC ?Sit to Stand: Min assist ?  ?  ?Step pivot transfers: Min assist ?  ?  ?General transfer comment: Pt ambulated approximately 50' at min asssit with his single point cane. ?  ? ?  ?Balance Overall balance assessment: Needs assistance ?Sitting-balance support: Feet unsupported ?Sitting balance-Leahy Scale: Fair ?  ?  ?Standing balance support: During functional activity, Reliant on assistive device for balance ?Standing balance-Leahy Scale: Fair ?  ?  ?  ?  ?  ?  ?  ?  ?  ?  ?  ?  ?   ? ?ADL either performed or assessed with clinical judgement  ? ?ADL Overall ADL's : Needs assistance/impaired ?Eating/Feeding: Independent ?Eating/Feeding Details (indicate cue type and reason): simulated ?Grooming: Wash/dry hands ?Grooming Details (indicate cue type and reason): simulated ?Upper Body Bathing: Set up ?  ?Lower Body Bathing: Minimal assistance ?Lower Body Bathing Details (indicate cue type and reason): simulated sit to stand ?Upper Body Dressing : Minimal assistance;Standing ?Upper Body Dressing Details (indicate cue type and reason): to donn gown like a robe ?Lower Body Dressing: Minimal assistance ?  ?Toilet Transfer: Minimal assistance ?  ?Toileting- Clothing Manipulation and Hygiene: Sit to/from stand;Minimal assistance ?Toileting - Clothing Manipulation Details (indicate cue type and reason): simulated ?  ?  ?Functional mobility during ADLs: Minimal assistance;Cane ?General ADL Comments: Pt initially needing min asssit for balance with standing and mobility, progressing to min guard with use of the single point cane.  ? ? ? ?Vision Baseline  Vision/History: 0 No visual deficits ?Ability to See in Adequate Light: 0 Adequate ?Patient Visual Report: No change from  baseline ?Vision Assessment?: Yes ?Ocular Range of Motion: Within Functional Limits ?Alignment/Gaze Preference: Within Defined Limits ?Tracking/Visual Pursuits: Able to track stimulus in all quads without difficulty ?Saccades: Within functional limits ?Convergence: Within functional limits ?Visual Fields: No apparent deficits  ?   ?Perception Perception ?Perception: Within Functional Limits ?  ?Praxis Praxis ?Praxis: Intact ?  ? ?Pertinent Vitals/Pain Pain Assessment ?Pain Assessment: No/denies pain  ? ? ? ?Hand Dominance Right ?  ?Extremity/Trunk Assessment Upper Extremity Assessment ?Upper Extremity Assessment: Overall WFL for tasks assessed ?  ?Lower Extremity Assessment ?Lower Extremity Assessment: Defer to PT evaluation ?  ?Cervical / Trunk Assessment ?Cervical / Trunk Assessment: Kyphotic ?  ?Communication Communication ?Communication: HOH ?  ?Cognition Arousal/Alertness: Awake/alert ?Behavior During Therapy: [facility] for tasks assessed/performed ?Overall Cognitive Status: Within Functional Limits for tasks assessed ?  ?  ?  ?  ?  ?  ?  ?  ?  ?  ?  ?  ?  ?  ?  ?  ?General Comments: Pt with 2/3 word recall after 5 minute delay. ?  ?  ?   ?   ?   ? ? ?Home Living Family/patient expects to be discharged to:: Private residence ?Living Arrangements: Spouse/significant other ?Available Help at Discharge: Available 24 hours/day ?Type of Home: House ?Home Access: Stairs to enter ?Entrance Stairs-Number of Steps: 2 from garage ?Entrance Stairs-Rails: None ?Home Layout: Two level;1/2 bath on main level;Bed/bath upstairs ?Alternate Level Stairs-Number of Steps: 16 ?  ?Bathroom Shower/Tub: Tub/shower unit;Curtain ?  ?Bathroom Toilet: Standard ?  ?  ?Home Equipment: Gilmer Mor - single point ?  ?  ?  ? ?  ?Prior Functioning/Environment Prior Level of Function : Independent/Modified Independent ?  ?  ?  ?  ?  ?  ?Mobility Comments: used cane sometimes especially on the steps ?  ?  ? ?  ?  ?OT Problem List: Decreased strength;Impaired  balance (sitting and/or standing);Decreased knowledge of use of DME or AE ?  ?   ?OT Treatment/Interventions: Self-care/ADL training;Balance training;Therapeutic activities;DME and/or AE instruction;Patient/family education  ?  ?OT Goals(Current goals can be found in the care plan section) Acute Rehab OT Goals ?Patient Stated Goal: Pt agreeable to OT session, wants to get back to his normal basline and return home. ?OT Goal Formulation: With patient ?Time For Goal Achievement: 09/06/21 ?Potential to Achieve Goals: Good  ?OT Frequency: Min 2X/week ?  ? ?   ?AM-PAC OT "6 Clicks" Daily Activity     ?Outcome Measure Help from another person eating meals?: None ?Help from another person taking care of personal grooming?: None ?Help from another person toileting, which includes using toliet, bedpan, or urinal?: A Little ?Help from another person bathing (including washing, rinsing, drying)?: A Little ?Help from another person to put on and taking off regular upper body clothing?: None ?Help from another person to put on and taking off regular lower body clothing?: A Little ?6 Click Score: 21 ?  ?End of Session Equipment Utilized During Treatment: Gait belt ? ?Activity Tolerance: Patient tolerated treatment well ?Patient left: in bed;with call bell/phone within reach ? ?OT Visit Diagnosis: Unsteadiness on feet (R26.81);Muscle weakness (generalized) (M62.81)  ?              ?Time: 6301-6010 ?OT Time Calculation (min): 41 min ?Charges:  OT General Charges ?$OT Visit: 1 Visit ?OT Evaluation ?$OT Eval Moderate Complexity:  1 Mod ?OT Treatments ?$Self Care/Home Management : 23-37 mins ? ? ?Rita Vialpando OTR/L ?08/23/2021, 2:32 PM ?

## 2021-08-23 NOTE — Hospital Course (Addendum)
Zachary Underwood is a 88 y.o. male with a history of hypertension, paroxysmal atrial fibrillation on Xarelto, hyperlipidemia who presented with right sided weakness and aphasia. Hospital course outlined by problem below: ? ?Acute/subacute CVA ?Patient presented with 2 days of resolved right-sided weakness and aphasia that started after a fall at home. CT head negative. MRI brain showed multiple foci of acute/early subacute ischemia in left parietal lobe with old cerebellar infarct, but no hemorrhage or mass effect.  CT angiogram showed no intracranial arterial Echocardiogram revealed LVEF 70-75%, no regional wall motion abnormalities, mild LVH, moderate thickening of aortic valve, moderate aortic stenosis. Neurology was consulted and thought the parietal infarcts to be secondary to atrial-fibrillation failed Xarelto and switched the patient to Eliquis with outpatient follow-up one month after discharge.  ? ?Paroxysmal atrial fibrillation ?Patient remained in normal sinus rhythm throughout admission. His anticoagulation was changed to Eliquis stated above. ? ?Hyperlipidemia ?Lipid panel showed LDL 54, cholesterol 110, TG 100. Atorvostatin 5mg  daily continued. ? ?BPH ?No urinary symptoms while inpatient. Home Flomax continued. ? ?Discharge follow up / PCP items: ?Resumed Atorvastatin 5mg  daily for secondary prevention despite LDL at goal. PCP to follow up. Consider lipid clinic follow up ?Patient on ACE, ARB, and thiazide prior to admission. Blood pressure normotensive (120s-130s/60s-60s) throughout hospitalization while off anti-hypertensives. These meds were continued at discharge at the request of his wife, with the caveat that they will monitor BP closely at home and reach out with any low readings. ?Neuropsych follow up for ADHD vs dementia concerns ?Assess financial barriers regarding Eliquis ?

## 2021-08-23 NOTE — Progress Notes (Addendum)
 Family Medicine Teaching Service Daily Progress Note Intern Pager: [phone]  Patient name: Zachary Underwood Medical record number: [id number] Date of birth: Nov 02, 1927 Age: 88 y.o. Gender: male  Primary Care Provider: Pcp, No Consultants: Neurology Code Status: Full  Pt Overview and Major Events to Date:  5/2: Admitted  Assessment and Plan: Zachary Underwood is a 88 y.o. male presenting with aphasia and right hand weakness, found to have acute/subacute strokes who remains hemodynamically stable without any residual deficits. PMH is significant for atrial fibrillation, h/o AAA s/p repair, osteoarthritis, HTN, HLD, and possibly CHF.  Multiple small foci of acute/early subacute ischemia in L parietal lobe Stable this AM. No residual weakness/aphasia. Risk stratification labs significant for A1c wnl at 5.4, TSH wnl at 3.062, and lipid panel mostly wnl. Etiology still unknown but certainly has risk factors including age, h/o HTN, HLD and atrial fibrillation. Watching BP and will treat as appropriate if too elevated- nothing required as of yet.For now, his diastolic BP remain low so will  monitor. - Awaiting neurology recommendations, appreciate care - Echocardiogram today - Fall precautions - Up with assistance - PT, OT, SLP eval and treat  Paroxysmal atrial fibrillation In NSR this am. - Continue Xarelto  15 mg daily - Continuous cardiac monitoring  Concern for aortic stenosis/possible heart failure 3/6 murmur on exam. Euvolemic on exam. History unclear if he has heart failure, but on metolazone  - Echocardiogram today - Hold metolazone   HLD Lipid panel showed LDL 54, cholesterol 110, TG 100.  - For secondary prevention, starting Atorvastatin  40mg  daily  BPH No urinary sx. - Continue Tamsulosin  0.4mg  daily  Other conditions chronic and stable: OA  FEN/GI: Vegetarian diet PPx: Xarelto , full-dose anticoagulation at 15mg  daily Dispo:Pending PT recommendations  pending clinical  improvement . Barriers include continued inpatient workup.   Subjective:  Patient denies any complaints this morning. No headache, chest pain, abdominal pain. No weakness or difficulty with speech.  Objective: Temp:  [97.5 F (36.4 C)-98 F (36.7 C)] 98 F (36.7 C) (05/02 1540) Pulse Rate:  [43-80] 51 (05/03 0600) Resp:  [13-22] 20 (05/03 0600) BP: (109-152)/(48-83) 118/61 (05/03 0600) SpO2:  [93 %-100 %] 95 % (05/03 0600) Physical Exam: General: Very pleasant elderly male, laying in bed in no distress Cardiovascular: Regular rate, 3/6 systolic crescendo decrescendo murmur Respiratory: Clear in all fields. Normal work of breathing on room air with SpO2 96% Abdomen: Soft, thin, NTND, BS x4 Neuro: A&O x4. Speech clear and fluent. No word finding difficulty. Sensation and strength grossly intact. Mild right eye ptosis. Wears hearing aids.  Extremities: Warm, well-perfused  Laboratory: Recent Labs  Lab 08/22/21 1616  WBC 6.5  HGB 13.1  HCT 39.8  PLT 192   Recent Labs  Lab 08/22/21 1616 08/23/21 0552  NA 140 136  K 4.7 3.5  CL 102 102  CO2 27 24  BUN 27* 25*  CREATININE 1.23 1.22  CALCIUM  10.8* 10.0  GLUCOSE 104* 98     Imaging/Diagnostic Tests: CT ANGIO HEAD NECK W WO CM  Result Date: 08/23/2021 CLINICAL DATA:  Recent fall.  Expressive aphasia. EXAM: CT ANGIOGRAPHY HEAD AND NECK TECHNIQUE: Multidetector CT imaging of the head and neck was performed using the standard protocol during bolus administration of intravenous contrast. Multiplanar CT image reconstructions and MIPs were obtained to evaluate the vascular anatomy. Carotid stenosis measurements (when applicable) are obtained utilizing NASCET criteria, using the distal internal carotid diameter as the denominator. RADIATION DOSE REDUCTION: This exam was performed according to the  departmental dose-optimization program which includes automated exposure control, adjustment of the mA and/or kV according to patient size  and/or use of iterative reconstruction technique. CONTRAST:  75mL OMNIPAQUE  IOHEXOL  350 MG/ML SOLN COMPARISON:  Brain MRI 08/22/2021 FINDINGS: CTA NECK FINDINGS SKELETON: There is no bony spinal canal stenosis. No lytic or blastic lesion. OTHER NECK: Normal pharynx, larynx and major salivary glands. No cervical lymphadenopathy. Unremarkable thyroid gland. UPPER CHEST: No pneumothorax or pleural effusion. No nodules or masses. AORTIC ARCH: There is calcific atherosclerosis of the aortic arch. Ascending thoracic aorta measures 3.9 cm. Conventional 3 vessel aortic branching pattern. The visualized proximal subclavian arteries are widely patent. RIGHT CAROTID SYSTEM: No dissection, occlusion or aneurysm. There is calcified atherosclerosis extending into the proximal ICA, resulting in 50% stenosis. LEFT CAROTID SYSTEM: No dissection, occlusion or aneurysm. There is calcified atherosclerosis extending into the proximal ICA, resulting in 65% stenosis. VERTEBRAL ARTERIES: Right dominant configuration. Both origins are clearly patent. There is no dissection, occlusion or flow-limiting stenosis to the skull base (V1-V3 segments). CTA HEAD FINDINGS POSTERIOR CIRCULATION: --Vertebral arteries: Normal V4 segments. --Inferior cerebellar arteries: Normal. --Basilar artery: Normal. --Superior cerebellar arteries: Normal. --Posterior cerebral arteries (PCA): Normal. ANTERIOR CIRCULATION: --Intracranial internal carotid arteries: Atherosclerotic calcification of the internal carotid arteries at the skull base without hemodynamically significant stenosis. --Anterior cerebral arteries (ACA): Normal. Both A1 segments are present. Patent anterior communicating artery (a-comm). --Middle cerebral arteries (MCA): Normal. VENOUS SINUSES: As permitted by contrast timing, patent. ANATOMIC VARIANTS: None Review of the MIP images confirms the above findings. IMPRESSION: 1. No intracranial arterial occlusion or high-grade stenosis. 2. 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 Aortic Atherosclerosis (ICD10-I70.0). Electronically Signed   By: Franky Stanford M.D.   On: 08/23/2021 00:51   CT Head Wo Contrast  Result Date: 08/22/2021 CLINICAL DATA:  Zachary Underwood 5 days ago, anticoagulated, altered level of consciousness EXAM: CT HEAD WITHOUT CONTRAST TECHNIQUE: Contiguous axial images were obtained from the base of the skull through the vertex without intravenous contrast. RADIATION DOSE REDUCTION: This exam was performed according to the departmental dose-optimization program which includes automated exposure control, adjustment of the mA and/or kV according to patient size and/or use of iterative reconstruction technique. COMPARISON:  None Available. FINDINGS: Brain: No acute infarct or hemorrhage. Encephalomalacia left cerebellar hemisphere consistent with prior infarct. Lateral ventricles and midline structures are unremarkable. No acute extra-axial fluid collections. No mass effect. Diffuse cerebral atrophy is age appropriate. Vascular: No hyperdense vessel or unexpected calcification. Skull: Normal. Negative for fracture or focal lesion. Sinuses/Orbits: No acute finding. Other: None. IMPRESSION: 1. No acute intracranial process. Electronically Signed   By: Ozell Daring M.D.   On: 08/22/2021 17:00   MR BRAIN WO CONTRAST  Result Date: 08/22/2021 CLINICAL DATA:  Fall.  Altered mental status. EXAM: MRI HEAD WITHOUT CONTRAST TECHNIQUE: Multiplanar, multiecho pulse sequences of the brain and surrounding structures were obtained without intravenous contrast. COMPARISON:  None Available. FINDINGS: Brain: Multiple small foci of acute/early subacute ischemia within the anterior left parietal lobe. There is an old left cerebellar infarct. No acute or chronic hemorrhage. Normal white matter signal. Generalized volume loss. The midline structures are normal. Vascular: Major flow  voids are preserved. Skull and upper cervical spine: Normal calvarium and skull base. Visualized upper cervical spine and soft tissues are normal. Sinuses/Orbits:No paranasal sinus fluid levels or advanced mucosal thickening. No mastoid or middle ear effusion. Normal orbits. IMPRESSION: 1. Multiple small foci of acute/early subacute  ischemia within the anterior left parietal lobe. No hemorrhage or mass effect. 2. Old left cerebellar infarct. Electronically Signed   By: Franky Stanford M.D.   On: 08/22/2021 21:50     Dartha Geralds, DO 08/23/2021, 7:20 AM PGY-1, [facility] FPTS Intern pager: [phone], text pages welcome

## 2021-08-23 NOTE — ED Notes (Signed)
Ambulatory with assistance to restroom  

## 2021-08-24 ENCOUNTER — Other Ambulatory Visit (HOSPITAL_COMMUNITY): Payer: Self-pay

## 2021-08-24 DIAGNOSIS — N400 Enlarged prostate without lower urinary tract symptoms: Secondary | ICD-10-CM | POA: Diagnosis present

## 2021-08-24 DIAGNOSIS — H02401 Unspecified ptosis of right eyelid: Secondary | ICD-10-CM | POA: Diagnosis present

## 2021-08-24 DIAGNOSIS — I480 Paroxysmal atrial fibrillation: Secondary | ICD-10-CM | POA: Diagnosis present

## 2021-08-24 DIAGNOSIS — Z8679 Personal history of other diseases of the circulatory system: Secondary | ICD-10-CM | POA: Diagnosis not present

## 2021-08-24 DIAGNOSIS — W010XXA Fall on same level from slipping, tripping and stumbling without subsequent striking against object, initial encounter: Secondary | ICD-10-CM | POA: Diagnosis present

## 2021-08-24 DIAGNOSIS — Z79899 Other long term (current) drug therapy: Secondary | ICD-10-CM | POA: Diagnosis not present

## 2021-08-24 DIAGNOSIS — I639 Cerebral infarction, unspecified: Secondary | ICD-10-CM | POA: Diagnosis present

## 2021-08-24 DIAGNOSIS — R29701 NIHSS score 1: Secondary | ICD-10-CM | POA: Diagnosis present

## 2021-08-24 DIAGNOSIS — Z9109 Other allergy status, other than to drugs and biological substances: Secondary | ICD-10-CM | POA: Diagnosis not present

## 2021-08-24 DIAGNOSIS — I6340 Cerebral infarction due to embolism of unspecified cerebral artery: Secondary | ICD-10-CM | POA: Diagnosis present

## 2021-08-24 DIAGNOSIS — Z886 Allergy status to analgesic agent status: Secondary | ICD-10-CM | POA: Diagnosis not present

## 2021-08-24 DIAGNOSIS — Z7901 Long term (current) use of anticoagulants: Secondary | ICD-10-CM | POA: Diagnosis not present

## 2021-08-24 DIAGNOSIS — M170 Bilateral primary osteoarthritis of knee: Secondary | ICD-10-CM | POA: Diagnosis present

## 2021-08-24 DIAGNOSIS — R4701 Aphasia: Secondary | ICD-10-CM | POA: Diagnosis present

## 2021-08-24 DIAGNOSIS — Z8673 Personal history of transient ischemic attack (TIA), and cerebral infarction without residual deficits: Secondary | ICD-10-CM | POA: Diagnosis not present

## 2021-08-24 DIAGNOSIS — G252 Other specified forms of tremor: Secondary | ICD-10-CM | POA: Diagnosis present

## 2021-08-24 DIAGNOSIS — R296 Repeated falls: Secondary | ICD-10-CM | POA: Diagnosis present

## 2021-08-24 DIAGNOSIS — I10 Essential (primary) hypertension: Secondary | ICD-10-CM | POA: Diagnosis present

## 2021-08-24 DIAGNOSIS — E785 Hyperlipidemia, unspecified: Secondary | ICD-10-CM | POA: Diagnosis present

## 2021-08-24 DIAGNOSIS — G8191 Hemiplegia, unspecified affecting right dominant side: Secondary | ICD-10-CM | POA: Diagnosis present

## 2021-08-24 DIAGNOSIS — Y92009 Unspecified place in unspecified non-institutional (private) residence as the place of occurrence of the external cause: Secondary | ICD-10-CM | POA: Diagnosis not present

## 2021-08-24 MED ORDER — METOLAZONE 2.5 MG PO TABS
2.5000 mg | ORAL_TABLET | Freq: Every day | ORAL | 0 refills | Status: DC
  Filled 2021-08-24: qty 30, 30d supply, fill #0

## 2021-08-24 MED ORDER — ATORVASTATIN CALCIUM 10 MG PO TABS: 5.0000 mg | ORAL_TABLET | Freq: Every day | ORAL | Status: DC

## 2021-08-24 MED ORDER — ATORVASTATIN CALCIUM 10 MG PO TABS
5.0000 mg | ORAL_TABLET | Freq: Every day | ORAL | 0 refills | Status: DC
  Filled 2021-08-24: qty 30, 60d supply, fill #0

## 2021-08-24 MED ORDER — DIAZEPAM 5 MG PO TABS: 2.5000 mg | ORAL_TABLET | Freq: Every day | ORAL | 0 refills | Status: DC | PRN

## 2021-08-24 MED ORDER — APIXABAN 5 MG PO TABS
5.0000 mg | ORAL_TABLET | Freq: Two times a day (BID) | ORAL | 0 refills | Status: DC
  Filled 2021-08-24: qty 60, 30d supply, fill #0

## 2021-08-24 NOTE — Progress Notes (Signed)
Occupational Therapy Treatment ?Patient Details ?Name: Zachary Underwood ?MRN: [id number] ?DOB: [birth year] ?Today's Date: 08/24/2021 ? ? ?History of present illness Zachary Underwood is a 88 y.o. male presenting with aphasia and right hand weakness, found to have acute/subacute strokes who remains hemodynamically stable without any residual deficits. PMH is significant for atrial fibrillation, h/o AAA s/p repair, osteoarthritis, HTN, HLD, and possibly CHF. ?  ?OT comments ? Pt currently improved to supervision level for dressing tasks and transfers with use of the cane.  Noted pt discharging today so recommend continued OT via HHOT services to further progress back to a modified independent level.   ? ?Recommendations for follow up therapy are one component of a multi-disciplinary discharge planning process, led by the attending physician.  Recommendations may be updated based on patient status, additional functional criteria and insurance authorization. ?   ?Follow Up Recommendations ? Home health OT  ?  ?Assistance Recommended at Discharge Frequent or constant Supervision/Assistance  ?Patient can return home with the following ? A little help with walking and/or transfers;A little help with bathing/dressing/bathroom;Assist for transportation;Assistance with cooking/housework ?  ?Equipment Recommendations ? BSC/3in1  ?  ?   ?Precautions / Restrictions Precautions ?Precautions: Fall ?Precaution Comments: has SPC now ?Restrictions ?Weight Bearing Restrictions: No  ? ? ?  ? ?Mobility Bed Mobility ?  ?  ?  ?  ?  ?  ?  ?  ?  ? ?Transfers ?Overall transfer level: Needs assistance ?Equipment used: Quad cane ?Transfers: Sit to/from Stand ?Sit to Stand: Supervision ?  ?  ?Step pivot transfers: Supervision ?  ?  ?  ?  ?  ?Balance Overall balance assessment: Needs assistance ?Sitting-balance support: Feet unsupported ?Sitting balance-Leahy Scale: Good ?  ?  ?Standing balance support: Reliant on assistive device for balance ?Standing  balance-Leahy Scale: Fair ?Standing balance comment: He needs assistive device for UE support. ?  ?  ?  ?  ?  ?  ?  ?  ?  ?  ?  ?   ? ?ADL either performed or assessed with clinical judgement  ? ?ADL   ?  ?  ?  ?  ?  ?  ?  ?  ?Upper Body Dressing : Supervision/safety;Standing ?  ?Lower Body Dressing: Supervision/safety;Sit to/from stand ?  ?Toilet Transfer: Supervision/safety;Ambulation ?Toilet Transfer Details (indicate cue type and reason): simulated with quad cane ?Toileting- Clothing Manipulation and Hygiene: Sit to/from stand;Supervision/safety ?Toileting - Clothing Manipulation Details (indicate cue type and reason): simulated ?  ?  ?Functional mobility during ADLs: Cane (supervision) ?General ADL Comments: Pt's spouse present for session with discharge expected soon.  Discussed benefit and need of 3:1 for use over the toilet at home and they both agree to use.  Also discussed recommendation for a tub bench as well to assist with transfer into the bathtub without having to step over the edge.  Both voice understanding as well and will look to purchase outside of the hospital.  Recommend continued HHOT at discharge. ?  ? ? ?   ?   ?   ? ?Cognition Arousal/Alertness: Awake/alert ?Behavior During Therapy: [facility] for tasks assessed/performed ?Overall Cognitive Status: Within Functional Limits for tasks assessed ?  ?  ?  ?  ?  ?  ?  ?  ?  ?  ?  ?  ?  ?  ?  ?  ?General Comments: Pt with age appropriate cognition with some decreased overall awareness.  Needs mod instructional cueing for use of  the quadcane as he likes to move without it. ?  ?  ?   ?   ?   ?   ? ? ?Pertinent Vitals/ Pain       Pain Assessment ?Pain Assessment: No/denies pain ? ?Home Living   ?  ?Available Help at Discharge: Family;Available 24 hours/day ?Type of Home: House ?  ?  ?  ?  ?  ?  ?  ?  ?  ?  ?  ?  ?  ?  ? Lives With: Spouse ? ?  ?Prior Functioning/Environment    ?  ?  ?  ?   ? ?Frequency ? Min 2X/week  ? ? ? ? ?  ?Progress Toward Goals ? ?OT  Goals(current goals can now be found in the care plan section) ? Progress towards OT goals: Progressing toward goals ? ?Acute Rehab OT Goals ?OT Goal Formulation: With patient ?Time For Goal Achievement: 09/06/21 ?Potential to Achieve Goals: Good  ?Plan Discharge plan remains appropriate   ? ?   ?AM-PAC OT "6 Clicks" Daily Activity     ?Outcome Measure ? ? Help from another person eating meals?: None ?Help from another person taking care of personal grooming?: None ?Help from another person toileting, which includes using toliet, bedpan, or urinal?: A Little ?Help from another person bathing (including washing, rinsing, drying)?: A Little ?Help from another person to put on and taking off regular upper body clothing?: None ?Help from another person to put on and taking off regular lower body clothing?: A Little ?6 Click Score: 21 ? ?  ?End of Session   ? ?OT Visit Diagnosis: Unsteadiness on feet (R26.81);Muscle weakness (generalized) (M62.81) ?  ?Activity Tolerance Patient tolerated treatment well ?  ?Patient Left in chair;with call bell/phone within reach;with family/visitor present ?  ?Nurse Communication Other (comment) (Pt ready to leave) ?  ? ?   ? ?Time: 1450-1530 ?OT Time Calculation (min): 40 min ? ?Charges: OT General Charges ?$OT Visit: 1 Visit ?OT Treatments ?$Self Care/Home Management : 38-52 mins ? ?Tayjah Lobdell OTR/L ?08/24/2021, 4:49 PM ?

## 2021-08-24 NOTE — Progress Notes (Signed)
Family Medicine Teaching Service ?Hospital Discharge Summary ? ?Patient name: Zachary Underwood Medical record number: [id number] ?Date of birth: 28-Dec-1927 Age: 88 y.o. Gender: male ?Date of Admission: 08/22/2021  Date of Discharge: 08/24/2020 ?Admitting Physician: Blane Ohara McDiarmid, MD ? ?Primary Care Provider: Pcp, No ?Consultants: Neurology ? ?Indication for Hospitalization: Right-sided weakness, aphasia ? ?Discharge Diagnoses/Problem List:  ?Principal Problem: ?  Stroke [facility]) ? ?Disposition: Home ? ?Discharge Condition: Stable ? ?Discharge Exam:  ?Blood pressure 139/74, pulse 85, temperature 98.6 ?F (37 ?C), temperature source Oral, resp. rate 18, height 5\' 6"  (1.676 m), weight 76.4 kg, SpO2 98 %.  ? ?General: Well-appearing, elderly male in no distress ?CV: RRR, no murmurs ?Resp: Normal work of breathing on room air. CTAB ?Abdomen: Soft, NTND, BS x4 ?Ext: Warm, well-perfused ?Neuro: A&O x4. Speech clear and fluent. No focal neuro deficits ? ?Brief Hospital Course:  ?TRUEN SCHAUL is a 88 y.o. male with a history of hypertension, paroxysmal atrial fibrillation on Xarelto, hyperlipidemia who presented with right sided weakness and aphasia. Hospital course outlined by problem below: ? ?Acute/subacute CVA ?Patient presented with 2 days of resolved right-sided weakness and aphasia that started after a fall at home. CT head negative. MRI brain showed multiple foci of acute/early subacute ischemia in left parietal lobe with old cerebellar infarct, but no hemorrhage or mass effect.  CT angiogram showed no intracranial arterial Echocardiogram revealed LVEF 70-75%, no regional wall motion abnormalities, mild LVH, moderate thickening of aortic valve, moderate aortic stenosis. Neurology was consulted and thought the parietal infarcts to be secondary to atrial-fibrillation failed Xarelto and switched the patient to Eliquis with outpatient follow-up one month after discharge.  ? ?Paroxysmal atrial fibrillation ?Patient remained in  normal sinus rhythm throughout admission. His anticoagulation was changed to Eliquis stated above. ? ?Hyperlipidemia ?Lipid panel showed LDL 54, cholesterol 110, TG 100. Atorvostatin 5mg  daily continued. ? ?BPH ?No urinary symptoms while inpatient. Home Flomax continued. ? ?Discharge follow up / PCP items: ?Resumed Atorvostatin 5mg  daily for secondary prevention despite LDL at goal. PCP to follow up. Consider lipid clinic follow up ?Patient on ACE, ARB, Calcium channel blocker and thiazide prior to admission. Blood pressure normotensive (120s-130s/60s-60s) throughout hospitalization and normal at time of discharge. Diastolic blood pressures low so did not resume any antihypertensives ?Neuropsych follow up for ADHD vs dementia concerns ?Assess financial barriers regarding Eliquis ? ? ?Significant Procedures: None ? ?Significant Labs and Imaging:  ?Recent Labs  ?Lab 08/22/21 ?1616  ?WBC 6.5  ?HGB 13.1  ?HCT 39.8  ?PLT 192  ? ?Recent Labs  ?Lab 08/22/21 ?1616 08/23/21 ?0552  ?NA 140 136  ?K 4.7 3.5  ?CL 102 102  ?CO2 27 24  ?GLUCOSE 104* 98  ?BUN 27* 25*  ?CREATININE 1.23 1.22  ?CALCIUM 10.8* 10.0  ? ? ?Results/Tests Pending at Time of Discharge: None ? ?Discharge Medications:  ?Allergies as of 08/24/2021   ? ?   Reactions  ? Beef-derived Products   ? Patient does not eat beef   ? Other Other (See Comments), Hypertension  ? Fusid- Legs became very swollen  ? Tylenol [acetaminophen] Hives  ? ?  ? ?  ?Medication List  ?  ? ?STOP taking these medications   ? ?ibuprofen 200 MG tablet ?Commonly known as: ADVIL ?  ?metolazone 2.5 MG tablet ?Commonly known as: ZAROXOLYN ?  ?OVER THE COUNTER MEDICATION ?  ?OVER THE COUNTER MEDICATION ?  ?OVER THE COUNTER MEDICATION ?  ?Xarelto 15 MG Tabs tablet ?Generic drug: Rivaroxaban ?  ? ?  ? ?  TAKE these medications   ? ?apixaban 5 MG Tabs tablet ?Commonly known as: ELIQUIS ?Take 1 tablet (5 mg total) by mouth 2 (two) times daily. ?  ?atorvastatin 10 MG tablet ?Commonly known as:  LIPITOR ?Take 1/2 tablet (5 mg total) by mouth daily. ?Start taking on: Aug 25, 2021 ?  ?CALCIUM PO ?Take 300 mg by mouth daily. ?  ?diazepam 5 MG tablet ?Commonly known as: VALIUM ?Take 0.5 tablets (2.5 mg total) by mouth daily as needed for anxiety. ?What changed:  ?when to take this ?reasons to take this ?  ?IRON PO ?Take 25 mg by mouth daily. ?  ?tamsulosin 0.4 MG Caps capsule ?Commonly known as: FLOMAX ?Take 0.4 mg by mouth at bedtime. ?  ? ?  ? ?  ?  ? ? ?  ?Durable Medical Equipment  ?(From admission, onward)  ?  ? ? ?  ? ?  Start     Ordered  ? 08/24/21 1134  For home use only DME Cane  Once       ?Comments: Quad cane  ? 08/24/21 1134  ? ?  ?  ? ?  ? ? ?Discharge Instructions: Please refer to Patient Instructions section of EMR for full details.  Patient was counseled important signs and symptoms that should prompt return to medical care, changes in medications, dietary instructions, activity restrictions, and follow up appointments.  ? ?Follow-Up Appointments: ? Follow-up Information   ? ? [facility]. Schedule an appointment as soon as possible for a visit in 1 month(s).   ?Specialty: Neurology ?Why: stroke clinic ?Contact information: ?[place] ?[phone] ? ?  ?  ? ? [facility]. Schedule an appointment as soon as possible for a visit in 1 week(s).   ?Specialty: Family Medicine ?Why: Call and make a follow up appointment with your new primary care office for hospital follow up. ?Contact information: ?[address] ?[place] [address] ?[phone] ? ? ?  ?  ? ? [facility] Follow up.   ?Specialty: Home Health Services ?Why: The home health agency will contact you for the first home visit. ?Contact information: ?[address] ?[address] ?[place] [address] ?[phone] ? ? ?  ?  ? ?  ?  ? ?  ? ? ?Orvis Brill, DO ?08/24/2021, 1:43 PM ?PGY-1, [place]  ?

## 2021-08-24 NOTE — TOC Benefit Eligibility Note (Signed)
Patient Advocate Encounter ? ?Insurance verification completed.   ? ?The patient is currently admitted and upon discharge could be taking Pradaxa 150mg  capsules. ? ?The current 30 day co-pay is, $80.44.  ? ? ?The patient is currently admitted and upon discharge could be taking Xarelto 20mg  tablets. ? ?The current 30 day co-pay is, $103.99. ? ? ?The patient is currently admitted and upon discharge could be taking Savaysa 30mg  tablets. ? ?Prior authorization is required. ? ? ?The patient is insured through D  ? ? ? , CPhT-Adv ?Pharmacy Patient Advocate Specialist ?[facility] Pharmacy Patient Advocate Team ?Direct Number: [phone]  Fax: [phone] ? ? ? ? ? ? ? ?

## 2021-08-24 NOTE — Progress Notes (Signed)
Physical Therapy Treatment ?Patient Details ?Name: Zachary Underwood ?MRN: [id number] ?DOB: Jan 24, 1928 ?Today's Date: 08/24/2021 ? ? ?History of Present Illness LEMARCUS CHRISTINE is a 88 y.o. male presenting with aphasia and right hand weakness, found to have acute/subacute strokes who remains hemodynamically stable without any residual deficits. PMH is significant for atrial fibrillation, h/o AAA s/p repair, osteoarthritis, HTN, HLD, and possibly CHF. ? ?  ?PT Comments  ? ? Pt trialed RW, SPC, and quad cane today to determine pt's level of safety during ambulation with each AD per wife's request. Pt and wife decided on a quad cane. Spoke extensively with patient about using the quad cane and how to acquire one. Pt's wife reports they just moved here from [name] and don't have family or a car. Case management notified and will address situation. Pt was indep with use of SPC prior to fall and this hospital stay. Recommending HHPT to address decreased strength, impaired balance, and increased falls risk. Acute PT to cont to follow. ?   ?Recommendations for follow up therapy are one component of a multi-disciplinary discharge planning process, led by the attending physician.  Recommendations may be updated based on patient status, additional functional criteria and insurance authorization. ? ?Follow Up Recommendations ? Home health PT ?  ?  ?Assistance Recommended at Discharge Intermittent Supervision/Assistance  ?Patient can return home with the following A little help with walking and/or transfers;A little help with bathing/dressing/bathroom;Assistance with cooking/housework;Assist for transportation;Help with stairs or ramp for entrance ?  ?Equipment Recommendations ?  (quad cane - pt to purchase on own)  ?  ?Recommendations for Other Services   ? ? ?  ?Precautions / Restrictions Precautions ?Precautions: Fall ?Restrictions ?Weight Bearing Restrictions: No  ?  ? ?Mobility ? Bed Mobility ?Overal bed mobility: Needs  Assistance ?Bed Mobility: Supine to Sit ?  ?  ?Supine to sit: Modified independent (Device/Increase time) ?  ?  ?General bed mobility comments: HOB flat, pt brought self to EOB, use of bed rail ?  ? ?Transfers ?Overall transfer level: Needs assistance ?Equipment used: Straight cane, Rolling walker (2 wheels), Quad cane ?Transfers: Sit to/from Stand ?Sit to Stand: Min assist ?  ?  ?  ?  ?  ?General transfer comment: minA to power up with bed in lowest setting, verbal cues when using RW at AD for ambulation to not pull up on RW, verbal cues to reach back for chair when sitting ?  ? ?Ambulation/Gait ?Ambulation/Gait assistance: Min guard ?Gait Distance (Feet): 100 Feet (x4) ?Assistive device: Straight cane, Rolling walker (2 wheels), Quad cane ?Gait Pattern/deviations: Step-through pattern, Wide base of support, Decreased stride length, Knee flexed in stance - left, Antalgic ?Gait velocity: reduced ?Gait velocity interpretation: <1.31 ft/sec, indicative of household ambulator ?  ?General Gait Details: pt trialed SPC, quad cane, and rolling walker per pt's wife's request to verify the safest AD for pt. Based on observation pt most safe with RW however pt reports feeling most comfortable with SPC, pt often dragging SPC and not using SPC properly, when given quad cane pt demo'd consistent sequencing and more stability with increased fluidity of gait pattern ? ? ?Stairs ?Stairs: Yes ?Stairs assistance: Min guard ?Stair Management: One rail Right, With cane ?Number of Stairs: 2 (x3 reps) ?General stair comments: pt educated on how to use cane in L hand when ascending his stairs at home and then switching it to the R when coming down. pt appreciative of education on leaving cane with where the L  LE is for optimal support ? ? ?Wheelchair Mobility ?  ? ?Modified Rankin (Stroke Patients Only) ?Modified Rankin (Stroke Patients Only) ?Pre-Morbid Rankin Score: Slight disability ?Modified Rankin: Moderate disability ? ? ?  ?Balance  Overall balance assessment: Needs assistance ?Sitting-balance support: Feet unsupported ?Sitting balance-Leahy Scale: Good ?  ?  ?Standing balance support: Single extremity supported, During functional activity ?Standing balance-Leahy Scale: Fair ?Standing balance comment: benefits from some support ?  ?  ?  ?  ?  ?  ?  ?  ?  ?  ?  ?  ? ?  ?Cognition Arousal/Alertness: Awake/alert ?Behavior During Therapy: [facility] for tasks assessed/performed ?Overall Cognitive Status: Within Functional Limits for tasks assessed ?  ?  ?  ?  ?  ?  ?  ?  ?  ?  ?  ?  ?  ?  ?  ?  ?General Comments: pt set in his ways, mildly impulsive but believe this is to be his baseline, pt oriented ?  ?  ? ?  ?Exercises   ? ?  ?General Comments General comments (skin integrity, edema, etc.): pt assisted to the restroom, verbal cues required for safety ?  ?  ? ?Pertinent Vitals/Pain Pain Assessment ?Pain Assessment: No/denies pain (but reports progressive R knee pain t/o the day)  ? ? ?Home Living   ?  ?  ?  ?  ?  ?  ?  ?  ?  ?   ?  ?Prior Function    ?  ?  ?   ? ?PT Goals (current goals can now be found in the care plan section) Acute Rehab PT Goals ?PT Goal Formulation: With patient ?Time For Goal Achievement: 08/30/21 ?Potential to Achieve Goals: Good ?Progress towards PT goals: Progressing toward goals ? ?  ?Frequency ? ? ? Min 3X/week ? ? ? ?  ?PT Plan Current plan remains appropriate  ? ? ?Co-evaluation   ?  ?  ?  ?  ? ?  ?AM-PAC PT "6 Clicks" Mobility   ?Outcome Measure ? Help needed turning from your back to your side while in a flat bed without using bedrails?: None ?Help needed moving from lying on your back to sitting on the side of a flat bed without using bedrails?: A Little ?Help needed moving to and from a bed to a chair (including a wheelchair)?: A Little ?Help needed standing up from a chair using your arms (e.g., wheelchair or bedside chair)?: A Little ?Help needed to walk in hospital room?: A Little ?Help needed climbing 3-5 steps with  a railing? : A Little ?6 Click Score: 19 ? ?  ?End of Session Equipment Utilized During Treatment: Gait belt ?Activity Tolerance: Patient tolerated treatment well ?Patient left: with call bell/phone within reach;in chair;with chair alarm set;with family/visitor present ?Nurse Communication: Mobility status ?PT Visit Diagnosis: Unsteadiness on feet (R26.81);Muscle weakness (generalized) (M62.81) ?  ? ? ?Time: [id number] ?PT Time Calculation (min) (ACUTE ONLY): 41 min ? ?Charges:  $Gait Training: 23-37 mins ?$Therapeutic Activity: 8-22 mins          ?          ? ?Kittie Plater, PT, DPT ?Acute Rehabilitation Services ?Secure chat preferred ?Office #: [phone] ? ? ? ?Francene Mcerlean M Shayda Kalka ?08/24/2021, 11:25 AM ? ?

## 2021-08-24 NOTE — Plan of Care (Signed)
?  Problem: Education: ?Goal: Knowledge of disease or condition will improve ?Outcome: Adequate for Discharge ?Goal: Understanding of medication regimen will improve ?Outcome: Adequate for Discharge ?Goal: Individualized Educational Video(s) ?Outcome: Adequate for Discharge ?  ?Problem: Activity: ?Goal: Ability to tolerate increased activity will improve ?Outcome: Adequate for Discharge ?  ?Problem: Cardiac: ?Goal: Ability to achieve and maintain adequate cardiopulmonary perfusion will improve ?Outcome: Adequate for Discharge ?  ?Problem: Health Behavior/Discharge Planning: ?Goal: Ability to safely manage health-related needs after discharge will improve ?Outcome: Adequate for Discharge ?  ?Problem: Education: ?Goal: Knowledge of disease or condition will improve ?Outcome: Adequate for Discharge ?Goal: Knowledge of secondary prevention will improve (SELECT ALL) ?Outcome: Adequate for Discharge ?Goal: Knowledge of patient specific risk factors will improve (INDIVIDUALIZE FOR PATIENT) ?Outcome: Adequate for Discharge ?Goal: Individualized Educational Video(s) ?Outcome: Adequate for Discharge ?  ?Problem: Coping: ?Goal: Will verbalize positive feelings about self ?Outcome: Adequate for Discharge ?Goal: Will identify appropriate support needs ?Outcome: Adequate for Discharge ?  ?Problem: Health Behavior/Discharge Planning: ?Goal: Ability to manage health-related needs will improve ?Outcome: Adequate for Discharge ?  ?Problem: Self-Care: ?Goal: Ability to participate in self-care as condition permits will improve ?Outcome: Adequate for Discharge ?Goal: Verbalization of feelings and concerns over difficulty with self-care will improve ?Outcome: Adequate for Discharge ?Goal: Ability to communicate needs accurately will improve ?Outcome: Adequate for Discharge ?  ?Problem: Nutrition: ?Goal: Risk of aspiration will decrease ?Outcome: Adequate for Discharge ?Goal: Dietary intake will improve ?Outcome: Adequate for Discharge ?   ?Problem: Ischemic Stroke/TIA Tissue Perfusion: ?Goal: Complications of ischemic stroke/TIA will be minimized ?Outcome: Adequate for Discharge ?  ?

## 2021-08-24 NOTE — TOC Transition Note (Signed)
Transition of Care (TOC) - CM/SW Discharge Note ? ? ?Patient Details  ?Name: Zachary Underwood ?MRN: [id number] ?Date of Birth: December 26, 1927 ? ?Transition of Care (TOC) CM/SW Contact:  ?Kermit Balo, RN ?Phone Number: ?08/24/2021, 12:43 PM ? ? ?Clinical Narrative:    ?Patient is from home with his spouse. They have recently moved her from [place]. They dont currently have a vehicle. Per wife the plan is to purchase a vehicle but have been using cabs. CM has provided them with resources for transportation.  ?Recommendations for a quad cane. Pts spouse is willing to pay for this out of pocket. CM has provided information to [employer] and they will deliver the DME to the room. ?Orders for [facility] services. They have no preference on [facility] agency. CM has arranged HH with [facility]. Information on the AVS. ?Pt has not seen his PCP yet. He has an appointment on May 16th at [facility].  ?Wife states they can not afford the Eliquis at $107/ month. Cm has updated the MD. ?Wife will arrange transport home.  ? ? ?Final next level of care: Home w Home Health Services ?Barriers to Discharge: No Barriers Identified ? ? ?Patient Goals and CMS Choice ?  ?CMS Medicare.gov Compare Post Acute Care list provided to:: Patient ?Choice offered to / list presented to : Patient, Spouse ? ?Discharge Placement ?  ?           ?  ?  ?  ?  ? ?Discharge Plan and Services ?  ?  ?           ?DME Arranged: Cane (Quad cane through private pay) ?DME Agency: [employer] ?Date DME Agency Contacted: 08/24/21 ?  ?Representative spoke with at DME Agency: Lawernce Keas ?HH Arranged: PT, OT, Social Work ?HH Agency: [facility] ?Date HH Agency Contacted: 08/24/21 ?  ?Representative spoke with at [facility] Agency: Candise Bowens ? ?Social Determinants of Health (SDOH) Interventions ?  ? ? ?Readmission Risk Interventions ?   ? View : No data to display.  ?  ?  ?  ? ? ? ? ? ?

## 2021-08-24 NOTE — Evaluation (Signed)
Speech Language Pathology Evaluation ?Patient Details ?Name: Zachary Underwood ?MRN: [id number] ?DOB: 06/25/1927 ?Today's Date: 08/24/2021 ?Time: 9528-4132 ?SLP Time Calculation (min) (ACUTE ONLY): 26 min ? ?Problem List:  ?Patient Active Problem List  ? Diagnosis Date Noted  ? Stroke [facility]) 08/23/2021  ? Atrial fibrillation (HCC)   ? ?Past Medical History:  ?Past Medical History:  ?Diagnosis Date  ? Arthritis   ? Cardiac arrhythmia   ? Hypertension   ? Ruptured aortic aneurysm (HCC)   ? Stroke [facility]) 08/23/2021  ? ?Past Surgical History:  ?Past Surgical History:  ?Procedure Laterality Date  ? open heart surgery    ? ruptured aortic aneurysm repair    ? ?HPI:  ?JARRET BRINN is a 88 y.o. male presenting with aphasia and right hand weakness, found to have acute/subacute strokes who remains hemodynamically stable without any residual deficits. PMH is significant for atrial fibrillation, h/o AAA s/p repair, osteoarthritis, HTN, HLD, and possibly CHF;  ? ?Assessment / Plan / Recommendation ?Clinical Impression ? Pt seen for a speech/language cognitive evaluation with a score of 28/30 obtained on the [employer] Mental Status Examination (SLUMS) which is  considered within normal limits on this assessment.  Wife concerned with processing of information d/t hearing loss (pt currently has HAs, but question accuracy).  OME unremarkable.  Speech intelligible within complex conversation.  Verbal expression and auditory comprehension accurate.  No ST f/u in acute setting, but f/u with audiologist for further assessment of hearing acuity in outpatient setting.  ST will s/o in this setting. ?   ?SLP Assessment ? SLP Recommendation/Assessment: Patient does not need any further Speech Language Pathology Services ?SLP Visit Diagnosis: Cognitive communication deficit (R41.841)  ?  ?Recommendations for follow up therapy are one component of a multi-disciplinary discharge planning process, led by the attending physician.   Recommendations may be updated based on patient status, additional functional criteria and insurance authorization. ?   ?Follow Up Recommendations ? No SLP follow up  ?  ?Assistance Recommended at Discharge ? Intermittent Supervision/Assistance  ?Functional Status Assessment Patient has not had a recent decline in their functional status  ?Frequency and Duration    ?  ?  ?   ?SLP Evaluation ?Cognition ? Overall Cognitive Status: Within Functional Limits for tasks assessed ?Arousal/Alertness: Awake/alert ?Orientation Level: Oriented X4 ?Year: 2023 ?Month: May ?Day of Week: Correct ?Attention: Sustained ?Sustained Attention: Appears intact ?Memory: Appears intact ?Awareness: Appears intact ?Problem Solving: Appears intact ?Safety/Judgment: Appears intact  ?  ?   ?Comprehension ? Auditory Comprehension ?Overall Auditory Comprehension: Appears within functional limits for tasks assessed ?Yes/No Questions: Within Functional Limits ?Commands: Within Functional Limits ?Conversation: Complex ?Interfering Components: Hearing ?Visual Recognition/Discrimination ?Discrimination: Within Function Limits ?Reading Comprehension ?Reading Status: Within funtional limits  ?  ?Expression Expression ?Primary Mode of Expression: Verbal ?Verbal Expression ?Overall Verbal Expression: Appears within functional limits for tasks assessed ?Initiation: No impairment ?Level of Generative/Spontaneous Verbalization: Conversation ?Repetition: No impairment ?Naming: No impairment ?Pragmatics: No impairment ?Written Expression ?Dominant Hand: Right ?Written Expression: Not tested   ?Oral / Motor ? Oral Motor/Sensory Function ?Overall Oral Motor/Sensory Function: Within functional limits ?Motor Speech ?Overall Motor Speech: Appears within functional limits for tasks assessed ?Respiration: Within functional limits ?Phonation: Normal ?Resonance: Within functional limits ?Articulation: Within functional limitis ?Intelligibility: Intelligible ?Motor  Planning: Witnin functional limits ?Motor Speech Errors: Not applicable ?Interfering Components: Hearing loss ?Effective Techniques: Over-articulate;Increased vocal intensity   ?        ? ?Tressie Stalker, M.S., CCC-SLP ?08/24/2021,  3:36 PM ? ?

## 2021-08-24 NOTE — Discharge Instructions (Addendum)
Dear Zachary Underwood, ? ?Thank you for letting us participate in your care. You were hospitalized for a stroke.  ? ?POST-HOSPITAL & CARE INSTRUCTIONS ?You are being switched from Xarelto to Eliquis. Please STOP Xarelto and START Eliquis.  ?We prescribed your same home statin medication for cholesterol lowering and prevention of plaque. Please discuss the risks and benefits of continuing this medication with your primary care doctor next week. If you cannot tolerate a statin, your PCP may refer you to lipid clinic for further management.  ?Do not resume your blood pressure medications until your primary care doctor tells you to take them again. During your hospitalization, your blood pressure was normal without it. Your ECHO did not show heart failure.  ?Please make an appointment to follow up with the neurology in one month.  ?Given your concerns for ADHD and behavior changes, your primary care doctor (PCP) could refer you to a psychiatrist or neuropsychology (at [facility] or [facility] neurology) for evaluation and possible diagnosis of ADHD, ADD, or early dementia. Some PMR clinics also have neuropsychologists.  ?Go to your follow up appointments (listed below) ? ? ?DOCTOR'S APPOINTMENT   ?No future appointments. ? Follow-up Information   ? ? [facility]. Schedule an appointment as soon as possible for a visit in 1 month(s).   ?Specialty: Neurology ?Why: stroke clinic ?Contact information: ?[place] ?[phone] ? ?  ?  ? ? Physicians, [facility]. Schedule an appointment as soon as possible for a visit in 1 week(s).   ?Specialty: Family Medicine ?Why: Call and make a follow up appointment with your new primary care office for hospital follow up. ?Contact information: ?[address] ?[place] [address] ?[phone] ? ? ?  ?  ? ?  ?  ? ?  ? ? ?Take care and be well! ? ?Family Medicine Teaching Service Inpatient Team ?Res[facility]  ?[facility]  ?[address] [place], [place] [address] ?[phone] ? ? ?Information on my medicine - ELIQUIS? (apixaban) ? ?Why was Eliquis? prescribed for you? ?Eliquis? was prescribed for you to reduce the risk of a blood clot forming that can cause a stroke if you have a medical condition called atrial fibrillation (a type of irregular heartbeat). ? ?What do You need to know about Eliquis? ? ?Take your Eliquis? TWICE DAILY - one tablet in the morning and one tablet in the evening with or without food. If you have difficulty swallowing the tablet whole please discuss with your pharmacist how to take the medication safely. ? ?Take Eliquis? exactly as prescribed by your doctor and DO NOT stop taking Eliquis? without talking to the doctor who prescribed the medication.  Stopping may increase your risk of developing a stroke.  Refill your prescription before you run out. ? ?After discharge, you should have regular check-up appointments with your healthcare provider that is prescribing your Eliquis?.  In the future your dose may need to be changed if your kidney function or weight changes by a significant amount or as you get older. ? ?What do you do if you miss a dose? ?If you miss a dose, take it as soon as you remember on the same day and resume taking twice daily.  Do not take more than one dose of ELIQUIS at the same time to make up a missed dose. ? ?Important Safety Information ?A possible side effect of Eliquis? is bleeding. You should call your healthcare  provider right away if you experience any of the following: ?Bleeding from an injury or your nose that does not stop. ?Unusual colored urine (red or dark brown) or unusual colored stools (red or black). ?Unusual bruising for unknown reasons. ?A serious fall or if you hit your head (even if there is no bleeding). ? ?Some medicines may interact with Eliquis? and might increase your risk of bleeding or clotting while on Eliquis?Marland Kitchen To help avoid this,  consult your healthcare provider or pharmacist prior to using any new prescription or non-prescription medications, including herbals, vitamins, non-steroidal anti-inflammatory drugs (NSAIDs) and supplements. ? ?This website has more information on Eliquis? (apixaban): http://www.eliquis.com/eliquis/home  ?

## 2021-08-24 NOTE — Progress Notes (Signed)
Patient requires a BSC at home due to his inability to safely ambulate to the bathroom at night. He requires the 3 in 1 by the bed.  ?

## 2021-08-24 NOTE — TOC Benefit Eligibility Note (Signed)
Patient Advocate Encounter ? ?Insurance verification completed.   ? ?The patient is currently admitted and upon discharge could be taking Eliquis 5mg  tablets. ? ?The current 30 day co-pay is, $107.49.  ? ?The patient is insured through [employer]  ? ? ?Lady Deutscher, CPhT-Adv ?Pharmacy Patient Advocate Specialist ?[place] Patient Advocate Team ?Direct Number: [phone]  Fax: [phone] ? ? ? ? ? ? ? ?

## 2021-08-24 NOTE — Progress Notes (Addendum)
FPTS Brief Progress Note ? ?S:Went to bedside to check on patient, patient sleeping comfortably in bed. Did not disturb.  ? ? ?O: ?BP 124/61 (BP Location: Right Arm)   Pulse 62   Temp 98.5 ?F (36.9 ?C) (Oral)   Resp 18   Ht 5\' 6"  (1.676 m)   Wt 76.4 kg   SpO2 97%   BMI 27.19 kg/m?   ? ? ?A/P: ?- Plans per day team ?- Orders reviewed. Labs for AM not ordered ? ? , MD ?08/24/2021, 2:47 AM ?PGY-1, [facility] Night Resident  ?Please page [phone] with questions.  ? ? ?

## 2021-08-25 ENCOUNTER — Telehealth: Payer: Self-pay | Admitting: Family Medicine

## 2021-08-25 ENCOUNTER — Other Ambulatory Visit (HOSPITAL_COMMUNITY): Payer: Self-pay

## 2021-08-25 ENCOUNTER — Other Ambulatory Visit: Payer: Self-pay | Admitting: Family Medicine

## 2021-08-25 DIAGNOSIS — F411 Generalized anxiety disorder: Secondary | ICD-10-CM

## 2021-08-25 MED ORDER — DIAZEPAM 5 MG PO TABS: 2.5000 mg | ORAL_TABLET | Freq: Two times a day (BID) | ORAL | 0 refills | Status: AC | PRN

## 2021-08-25 NOTE — Progress Notes (Addendum)
I was asked to contact Mrs Zachary Underwood by Dr C. 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 He was discharged home yesterday after an embolic stroke event.  Mrs Zachary Underwood is concerned about prescription of Dr Zachary Underwood valium.  ? ?Dr and Mrs Zachary Underwood have recently moved back to the [place] from [name] where Mr Zachary Underwood has recived his care for many years.  Dr Zachary Underwood has an initial appointment on 09/04/21 with a community PCP to establish his primary care.  ? ?Dr Zachary Underwood has had a standing prescription for valium from his doctor in [name]  He has used occasional half-tablet valium (2.5 mg) as needed when there is a exacerbation of his general anxiety condition. He has recently run out of his valium since coming [place] the states. The prescription from his doctor in [name] cannot be honored in the [place].  ? ?Unfortunately, the Valium prescription sent by Dr Zachary Underwood yesterday to [facility] was unable to be filled.  ? ?A prescription for Valium 5 mg tablet, #30, RF zero sent to [facility] a moment ago. ?I spoke with the [facility] to confirm that they would be able to deliver this prescription today. ?The pharmacy agreed to contact Mrs Zachary Underwood before the delivery to discuss how payment for the prescription can be performed.  ? ?I re-contacted Mrs Zachary Underwood to tell her about my discussion with [facility] about delivering the valium prescription to their home today, and their agreement to contact Mrs Zachary Underwood by phone before the delivery.  ? ?I am available to help with other issues that may arise with Dr Zachary Underwood transition to the community.  ? ? ?

## 2021-08-25 NOTE — Telephone Encounter (Signed)
I received a call from Mrs Eustice asking how the [facility] was able to get her $4 for valium prescription that was called into [facility] on 08/24/21.  She believes that the [facility] pharmacy was able to find this lower cost from one of her insurers.  ? ?I contacted [facility] who was unable to verify the $4 cost for the valium. ? ?I will see if our embedded FMTS pharmacy can help with this patient query.  ?

## 2021-08-28 ENCOUNTER — Telehealth: Payer: Self-pay | Admitting: Family Medicine

## 2021-08-28 NOTE — Discharge Summary (Signed)
Family Medicine Teaching Service ?Hospital Discharge Summary ? ?Patient name: Zachary Underwood Medical record number: [id number] ?Date of birth: [birth year] Age: 88 y.o. Gender: male ?Date of Admission: 08/22/2021  Date of Discharge: 08/24/2020 ?Admitting Physician: Blane Ohara McDiarmid, MD ? ?Primary Care Provider: Pcp, No ?Consultants: Neurology ? ?Indication for Hospitalization: Right-sided weakness, aphasia ? ?Discharge Diagnoses/Problem List:  ?Principal Problem: ?  Stroke [facility]) ? ?Disposition: Home ? ?Discharge Condition: Stable ? ?Discharge Exam:  ?Blood pressure 139/74, pulse 85, temperature 98.6 ?F (37 ?C), temperature source Oral, resp. rate 18, height 5\' 6"  (1.676 m), weight 76.4 kg, SpO2 98 %.  ? ?General: Well-appearing, elderly male in no distress ?CV: RRR, no murmurs ?Resp: Normal work of breathing on room air. CTAB ?Abdomen: Soft, NTND, BS x4 ?Ext: Warm, well-perfused ?Neuro: A&O x4. Speech clear and fluent. No focal neuro deficits ? ?Brief Hospital Course:  ?Zachary Underwood is a 88 y.o. male with a history of hypertension, paroxysmal atrial fibrillation on Xarelto, hyperlipidemia who presented with right sided weakness and aphasia. Hospital course outlined by problem below: ? ?Acute/subacute CVA ?Patient presented with 2 days of resolved right-sided weakness and aphasia that started after a fall at home. CT head negative. MRI brain showed multiple foci of acute/early subacute ischemia in left parietal lobe with old cerebellar infarct, but no hemorrhage or mass effect.  CT angiogram showed no intracranial arterial Echocardiogram revealed LVEF 70-75%, no regional wall motion abnormalities, mild LVH, moderate thickening of aortic valve, moderate aortic stenosis. Neurology was consulted and thought the parietal infarcts to be secondary to atrial-fibrillation failed Xarelto and switched the patient to Eliquis with outpatient follow-up one month after discharge.  ? ?Paroxysmal atrial fibrillation ?Patient remained in  normal sinus rhythm throughout admission. His anticoagulation was changed to Eliquis stated above. ? ?Hyperlipidemia ?Lipid panel showed LDL 54, cholesterol 110, TG 100. Atorvostatin 5mg  daily continued. ? ?BPH ?No urinary symptoms while inpatient. Home Flomax continued. ? ?Discharge follow up / PCP items: ?Resumed Atorvastatin 5mg  daily for secondary prevention despite LDL at goal. PCP to follow up. Consider lipid clinic follow up ?Patient on ACE, ARB, and thiazide prior to admission. Blood pressure normotensive (120s-130s/60s-60s) throughout hospitalization while off anti-hypertensives. These meds were continued at discharge at the request of his wife, with the caveat that they will monitor BP closely at home and reach out with any low readings. ?Neuropsych follow up for ADHD vs dementia concerns ?Assess financial barriers regarding Eliquis ? ? ?Significant Procedures: None ? ?Significant Labs and Imaging:  ?Recent Labs  ?Lab 08/22/21 ?1616  ?WBC 6.5  ?HGB 13.1  ?HCT 39.8  ?PLT 192  ? ? ?Recent Labs  ?Lab 08/22/21 ?1616 08/23/21 ?0552  ?NA 140 136  ?K 4.7 3.5  ?CL 102 102  ?CO2 27 24  ?GLUCOSE 104* 98  ?BUN 27* 25*  ?CREATININE 1.23 1.22  ?CALCIUM 10.8* 10.0  ? ? ? ?Results/Tests Pending at Time of Discharge: None ? ?Discharge Medications:  ?Allergies as of 08/24/2021   ? ?   Reactions  ? Beef-derived Products   ? Patient does not eat beef   ? Other Other (See Comments), Hypertension  ? Fusid- Legs became very swollen  ? Tylenol [acetaminophen] Hives  ? ?  ? ?  ?Medication List  ?  ? ?STOP taking these medications   ? ?diazepam 5 MG tablet ?Commonly known as: VALIUM ?  ?ibuprofen 200 MG tablet ?Commonly known as: ADVIL ?  ?OVER THE COUNTER MEDICATION ?  ?Xarelto 15 MG Tabs  tablet ?Generic drug: Rivaroxaban ?  ? ?  ? ?TAKE these medications   ? ?atorvastatin 10 MG tablet ?Commonly known as: LIPITOR ?Take 1/2 tablet (5 mg total) by mouth daily. ?  ?CALCIUM PO ?Take 300 mg by mouth daily. ?  ?Eliquis 5 MG Tabs  tablet ?Generic drug: apixaban ?Take 1 tablet (5 mg total) by mouth 2 (two) times daily. ?  ?IRON PO ?Take 25 mg by mouth daily. ?  ?metolazone 2.5 MG tablet ?Commonly known as: ZAROXOLYN ?Take 1 tablet (2.5 mg total) by mouth daily. ?  ?OVER THE COUNTER MEDICATION ?Take 8 mg by mouth See admin instructions. Candor 8 mg- Take 8 mg by mouth once a day ?  ?OVER THE COUNTER MEDICATION ?Take 10 mg by mouth See admin instructions. Lercapress 10 mg- Take 10 mg by mouth once a day ?  ?tamsulosin 0.4 MG Caps capsule ?Commonly known as: FLOMAX ?Take 0.4 mg by mouth at bedtime. ?  ? ?  ? ? ?Discharge Instructions: Please refer to Patient Instructions section of EMR for full details.  Patient was counseled important signs and symptoms that should prompt return to medical care, changes in medications, dietary instructions, activity restrictions, and follow up appointments.  ? ?Follow-Up Appointments: ? Follow-up Information   ? ? [facility]. Schedule an appointment as soon as possible for a visit in 1 month(s).   ?Specialty: Neurology ?Why: stroke clinic ?Contact information: ?[place] ?[phone] ? ?  ?  ? ? Physicians, [facility]. Schedule an appointment as soon as possible for a visit in 1 week(s).   ?Specialty: Family Medicine ?Why: Call and make a follow up appointment with your new primary care office for hospital follow up. ?Contact information: ?[address] ?[place] [address] ?[phone] ? ? ?  ?  ? ? [facility] Follow up.   ?Specialty: Home Health Services ?Why: The home health agency will contact you for the first home visit. ?Contact information: ?[address] [place] [address] ?[address] ?[place] [address] ?[phone] ? ? ?  ?  ? ?  ?  ? ?  ? ? ?Zachary Brittle, DO ?08/28/2021, 4:36 PM ?PGY-1, [place]  ?

## 2021-08-28 NOTE — Telephone Encounter (Signed)
I received a call on my mobile phone from [name] about her husband.  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 ? ?[name] related a few concerns: ? ? 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  He was unable to get up on his own.  EMS assisted him up.  There was no loss of consciousness before or after the fall. He has been acting his usual self since the fall. ? ?      [name] had been standing at the kitchen counter for about 20 minutes when he fell.   ?      [name] is asking what could be done to prevent backward falls. ? ?      Dr Zachary Underwood was recently seen by [facility]. ? ?2.    While [name] has been measuring his BP in the 120s, She was concerned that the fall could be related to blood pressure, so she reduced each of his BP medication doses by half. ?His latest SBP is 136. ? ?3.  [name] will need to be out of the home for all day tomorrow.  She wonders how she can have her husband in a supervised setting for the day. ? ?Recommendations given ? ?- I will contact [facility] for options to prevent falls backwards ?- I did not disagree with [name] reduction in each of his antihypertensive medications' dose ?- I recommend [name] contact a [place] to arrange supervisory coverage  ? ? ? ? ? ?  ?

## 2021-08-29 ENCOUNTER — Encounter: Payer: Self-pay | Admitting: *Deleted

## 2021-08-29 ENCOUNTER — Other Ambulatory Visit: Payer: Self-pay | Admitting: *Deleted

## 2021-08-29 DIAGNOSIS — I700 Atherosclerosis of aorta: Secondary | ICD-10-CM

## 2021-08-29 NOTE — Patient Outreach (Signed)
[facility]) Care Management ?Telephonic RN Care Manager Note ? ? ?08/29/2021 ?Name:  Zachary Underwood MRN:  [id number] DOB:  10-07-1927 ? ?Summary: ?Received EMMI stroke referral for pt on 08/29/21 after a call concern reported on Monday Day #3 1000 08/25/21 related to questions/problems with medications/ Yes ?Unsuccessful outreach via phone and e-mail to wife as [phone] indicated ?No answer at [phone] as the mail box is full  ?E-mail to wife at [email or web address] ?Unable to reach [facility] letter mailed  ? ? ?Subjective: ?Zachary Underwood is an 88 y.o. year old male who is a primary patient of McDiarmid, Blane Ohara, MD. The care management team was consulted for assistance with care management and/or care coordination needs.   ? ?Telephonic RN Care Manager completed Telephone Visit today.  ? ?Objective: ? ?Medications Reviewed Today   ? ? Reviewed by Dessie Coma, CPhT (Pharmacy Technician) on 08/22/21 at 2238  Med List Status: Complete  ? ?Medication Order Taking? Sig Documenting Provider Last Dose Status Informant  ?CALCIUM PO [id number] Yes Take 300 mg by mouth daily. [provider] 08/22/2021 Active Multiple Informants  ?diazepam (VALIUM) 5 MG tablet [id number] Yes Take 2.5 mg by mouth daily. [provider] 08/22/2021 1200 Active Multiple Informants  ?         ?Med Note [name], [name]   Tue Aug 22, 2021 10:18 PM) The patient has been taking this from a his supply brought from [name]  ?Ferrous Sulfate (IRON PO) [id number] Yes Take 25 mg by mouth daily. [provider] 08/22/2021 Active Multiple Informants  ?ibuprofen (ADVIL) 200 MG tablet [id number] Yes Take 200 mg by mouth every 6 (six) hours as needed for mild pain (or "to thin the blood"). [provider] 08/21/2021 Active Multiple Informants  ?metolazone (ZAROXOLYN) 2.5 MG tablet [id number] Yes Take 2.5 mg by mouth daily. [provider] 08/22/2021 am Active Multiple Informants  ?OVER THE COUNTER MEDICATION  [id number] Yes Take 5 mg by mouth See admin instructions. Stator 5 mg - Take 5 mg by mouth at bedtime [provider] 08/21/2021 pm Active Multiple Informants  ?         ?Med Note [name], [name]   Tue Aug 22, 2021 10:35 PM) From [name]  ?OVER THE COUNTER MEDICATION [id number] Yes Take 8 mg by mouth See admin instructions. Candor 8 mg- Take 8 mg by mouth once a day [provider] 08/22/2021 Active Multiple Informants  ?         ?Med Note [name], [name]   Tue Aug 22, 2021 10:35 PM) From [name]  ?OVER THE COUNTER MEDICATION [id number] Yes Take 10 mg by mouth See admin instructions. Lercapress 10 mg- Take 10 mg by mouth once a day [provider] 08/22/2021 am Active Multiple Informants  ?         ?Med Note [name], [name]   Tue Aug 22, 2021 10:35 PM) From [name]  ?tamsulosin (FLOMAX) 0.4 MG CAPS capsule [id number] Yes Take 0.4 mg by mouth at bedtime. [provider] 08/21/2021 pm Active Multiple Informants  ?XARELTO 15 MG TABS tablet [id number] Yes Take 15 mg by mouth in the morning. [provider] 08/22/2021 0800 Active Multiple Informants  ?         ?Med Note [name], [name]   Tue Aug 22, 2021 10:37 PM) The patient stated he takes this only once a day  ? ?  ?  ? ?  ? ?Past  Medical History:  ?Diagnosis Date  ? Arthritis   ? Cardiac arrhythmia   ? Hypertension   ? Ruptured aortic aneurysm ([place])   ? Stroke [facility]) 08/23/2021  ? ? ? ?SDOH:  (Social Determinants of Health) assessments and interventions performed:  ? ? ?Care Plan ? ?Review of patient past medical history, allergies, medications, health status, including review of consultants reports, laboratory and other test data, was performed as part of comprehensive evaluation for care management services.  ? ?There are no care plans that you recently modified to display for this patient. ?  ? ?Plan: The patient has been provided with contact information for the care management team and has been advised to call with any health related questions  or concerns.  ?The care management team will reach out to the patient again over the next 7 business  days. ? ?Trebor Galdamez L. Lavina Hamman, RN, BSN, CCM ?[facility] Management Care Coordinator ?Office number [phone] ?[facility] number [phone] ?Fax number [phone] ? ? ? ? ? ?

## 2021-08-29 NOTE — Patient Outreach (Signed)
Received a red flag Emmi stroke notification for Mr. Jansma . ?I have assigned Joellyn Quails, RN to call for follow up and determine if there are any Case Management needs.  ?  ?Arville Care, CBCS, CMAA ?[place] Management Assistant ?[facility] Management ?[phone]   ?

## 2021-08-31 ENCOUNTER — Other Ambulatory Visit: Payer: Self-pay | Admitting: *Deleted

## 2021-08-31 NOTE — Patient Outreach (Signed)
Zachary Underwood) Care Management ?Telephonic RN Care Manager Note ? ? ?08/31/2021 ?Name:  Zachary Underwood MRN:  [id number] DOB:  04/12/1928 ? ?Summary: ?Received a return message from wife leaving a number for pt [phone]. The number listed in EPIC as the preferred number is wife number [phone] ? ?Follow up attempt with success at reaching wife using [phone]  ? ?EMMI ?Verified wife spoke with Dr Zachary Underwood about pt medication concerns and issues are resolved as of today ? ?Home health/stroke Pt is aphasic per wife She reports pt has only been seen by well care staff x 1 russian staff member, "Gae Gallop" for intake only, wife reports no one returns a call, RN CM notes pt was ordered PT, OT, Social Work ? ?[place] again on Tuesday 08/29/21 (prior fall at home day of admission for stroke) ?  ? ?No transportation at this time per wife ?IP CM has provided them with resources for transportation.  ?She can not drive wife recovering from 100% post disability ? 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  ?DME  Has 3n1 & cane ? ? NO PCP yet. He has an appointment on May 15 th at [facility].  ?Medicines Wife stated they can not afford the Eliquis at $107/ month. Hospital RN CM assisted ? ?Wife reported she has not received e-mail sent by this RN CM  ?Concluded call to facilitate/assist movers ? ?Recommendations/Changes  made from today's visit: ?Initial assessment started  ?Answered questions for wife  ?Allowed wife time to ventilated her feelings ? ? ? ?Subjective: ?Zachary Underwood is an 88 y.o. year old male who is a primary patient of Zachary Underwood, Zachary Ohara, MD. The care management team was consulted for assistance with care management and/or care coordination needs.   ? ?Telephonic RN Care Manager completed Telephone Visit today.  ? ?Objective: ? ?Medications Reviewed Today   ? ? Reviewed by Zachary Underwood, CPhT (Pharmacy Technician) on 08/22/21 at 2238  Med List Status: Complete  ? ?Medication Order Taking? Sig Documenting Provider Last Dose Status Informant  ?CALCIUM PO [id number] Yes Take 300 mg by mouth daily. [provider] 08/22/2021 Active Multiple Informants  ?diazepam (VALIUM) 5 MG tablet [id number] Yes Take 2.5 mg by mouth daily. [provider] 08/22/2021 1200 Active Multiple Informants  ?         ?Med Note [name], [name]   Tue Aug 22, 2021 10:18 PM) The patient has been taking this from a his supply brought from [name]  ?Ferrous Sulfate (IRON PO) [id number] Yes Take 25 mg by mouth daily. [provider] 08/22/2021 Active Multiple Informants  ?ibuprofen (ADVIL) 200 MG tablet [id number] Yes Take 200 mg by mouth every 6 (six) hours as needed for mild pain (or "to thin the blood"). [provider] 08/21/2021 Active Multiple Informants  ?metolazone (ZAROXOLYN) 2.5 MG tablet [id number] Yes  Take 2.5 mg by mouth daily. [provider] 08/22/2021 am Active Multiple Informants  ?OVER THE COUNTER MEDICATION [id number] Yes Take 5 mg by mouth See admin instructions. Stator 5 mg - Take 5 mg by mouth at bedtime [provider] 08/21/2021 pm Active Multiple Informants  ?         ?Med Note [name], [name]   Tue Aug 22, 2021 10:35 PM) From [name]  ?OVER THE COUNTER MEDICATION [id number] Yes Take 8 mg by mouth See admin instructions. Candor 8 mg- Take 8 mg by mouth once a day [provider]  08/22/2021 Active Multiple Informants  ?         ?Med Note [name], [name]   Tue Aug 22, 2021 10:35 PM) From [name]  ?OVER THE COUNTER MEDICATION [id number] Yes Take 10 mg by mouth See admin instructions. Lercapress 10 mg- Take 10 mg by mouth once a day [provider] 08/22/2021 am Active Multiple Informants  ?         ?Med Note [name], [name]   Tue Aug 22, 2021 10:35 PM) From [name]  ?tamsulosin (FLOMAX) 0.4 MG CAPS capsule [id number] Yes Take 0.4 mg by mouth at bedtime. [provider] 08/21/2021 pm Active Multiple Informants  ?XARELTO 15 MG TABS tablet [id number] Yes Take 15 mg by mouth in the morning. [provider] 08/22/2021 0800 Active Multiple Informants  ?         ?Med Note [name], [name]   Tue Aug 22, 2021 10:37 PM) The patient stated he takes this only once a day  ? ?  ?  ? ?  ? ?Past Medical History:  ?Diagnosis Date  ? Arthritis   ? Cardiac arrhythmia   ? Hypertension   ? 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 Stroke [facility]) 08/23/2021  ? ? ? ?SDOH:  (Social Determinants of Health) assessments and interventions performed:  ? ? ?Care Plan ? ?Review of patient past medical history, allergies, medications, health status, including review of consultants reports, laboratory and other test data, was performed as part of comprehensive evaluation for care management services.  ? ?There are no care plans that you recently modified to display for this patient. ?  ? ?Plan: The patient has been provided with contact information for the care management team and has been advised to call with any health related questions or concerns.  ?The care management team will reach out to the patient again over the next 7+ business days. ? ?Zachary Puzzo L. Lavina Hamman, RN, BSN, CCM ?[place] Management Care Coordinator ?Office number [phone] ?Main [facility] number [phone] ?Fax number [phone] ? ? ? ? ? ?

## 2021-09-01 ENCOUNTER — Other Ambulatory Visit: Payer: Self-pay | Admitting: *Deleted

## 2021-09-01 NOTE — Patient Outreach (Addendum)
[facility]) Care Management ? ?09/01/2021 ? ?Zachary Underwood ?May 24, 1927 ?[id number] ? ? ?[place] coordination- collaboration with home health  ? ?Spoke with Zachary Underwood of [employer] well care home care private duty division  ?Confirms wellcare is not able to provide private duty home services for [place] patients at this time ? ?Left message at [phone] [facility] prompt 1 requesting a return call ?Wife has also reported leaving messages with pending return calls ? ?Outreach to inpatient RN CM to locate a Well care staff connection to assist ? ?Sent an e-mail via wellcare online site requesting return calls to wife and RN CM  ? ?Returned a call to [employer] with wellcare [phone]) after a voice message was left  ?Zachary Underwood states wife keeps cancelling visits when staff call, nor was the door answered during a home visit this week (possibly on Tuesday 08/30/21) ?Zachary Underwood tried calling her before calling me on all numbers available without an answer ?Zachary Underwood and well care staff will continue to attempt to engage wife and patient for services ?Private duty services discussed. [facility] may be an option to be offered.  Well care SW not scheduled for a visit until next week.   ?RN CM discussed RN CM interventions to have various private duty agencies to outreach to attempt to offer assist to wife so she will be able to make her choice of preferred agency ? ?Spoke with Zachary Underwood at [place] at [phone] who offered [facility] [phone] as a resource for private pay for pt ?Spoke with Zachary Underwood left a message for Zachary Underwood the liaison of [facility] [phone] ?Spoke with Zachary Underwood at [facility] who recommends Zachary Underwood, new owner at [phone] ?Outreach to [place] at [place] to leave a message as the voice mail box is full ?Outreach to [facility] 336 [phone] Spoke with Zachary Underwood and client liaison, Zachary Underwood to discuss pt needs  reported by wife, private pay cost options ?Coordinated a conference call with wife and Zachary Underwood  ?Wife reports pt can do his  Activities of daily living (ADLs) but now she needs today assist with "moving boxes", cleaning the home and preventing it from being "dangerous" for the patient,  to make home safe   ?Wife ventilate her feelings  ?$28/hr $500+/week was agreed upon by the wife for [facility] services ?A scheduled Monday Sep 05 2021 1030 appointment with Zachary Underwood was made ? ? ?Received a return call from Zachary Underwood liaison [phone]. Updated her on wife choice of services Zachary Underwood discussed availability if needed in future  ? ?Plan ?[facility] RN CM will follow up with patient within the next 7-14 business days ? ? ? ?Sira Adsit L. Lavina Hamman, RN, BSN, CCM ?[place] Management Care Coordinator ?Office number [phone] ? ?

## 2021-09-04 ENCOUNTER — Other Ambulatory Visit: Payer: Self-pay | Admitting: *Deleted

## 2021-09-04 ENCOUNTER — Encounter: Payer: Self-pay | Admitting: *Deleted

## 2021-09-04 NOTE — Patient Outreach (Signed)
[facility]) Care Management ?Telephonic RN Care Manager Note ? ? ?09/04/2021 ?Name:  Zachary [facility]wood MRN:  [id number] DOB:  10-05-1927 ? ?Summary: ?Received a call from patient's wife who inquired  about the number for [facility] and home safety for the patient. Wife reports their international moving group left items stack and blocking garage entrances- concern with an increase risk for injury for the patient ?Pending the arrival of [facility] staff. Wife had called the [facility] office earlier and spoke with staff whom she reports "were very pleasant" ?[facility] staff Drenda Freeze arrived during this outreach with the wife (close to [phone]) Wife disconnected ? ?Recommendations/Changes made from today's visit: ?Provided contact numbers for well care, [facility] ?Verified the appointment time for [facility] visit for Monday Sep 04 2021 at 1030 Today ?Allowed wife to ventilate her feelings  ?Offered suggestion of obtaining further assistance to help reduce the stacked items to decrease the risk of an injury to the patient ?1250 Confirmed [facility] meeting went well in pt home and pt plus wife are at new pcp office. Discussed with wife the availability of SW services at the pcp office She voiced her appreciation ?Outreached to new pcp office [employer] [phone] and spoke with staff to confirm pt initiated care with Dr Brayton El today ? ?Subjective: ?Zachary Underwood is an 88 y.o. year old male who is a primary patient of McDiarmid, Leighton Roach, MD. The care management team was consulted for assistance with care management and/or care coordination needs.   ? ?Telephonic RN Care Manager completed Telephone Visit today.  ? ?Objective: ? ?Medications Reviewed Today   ? ? Reviewed by Salvatore Marvel, CPhT (Pharmacy Technician) on 08/22/21 at 2238  Med List Status: Complete  ? ?Medication Order Taking? Sig Documenting Provider Last Dose Status Informant  ?CALCIUM PO [id number] Yes Take  300 mg by mouth daily. [provider] 08/22/2021 Active Multiple Informants  ?diazepam (VALIUM) 5 MG tablet [id number] Yes Take 2.5 mg by mouth daily. [provider] 08/22/2021 1200 Active Multiple Informants  ?         ?Med Note Antony Madura, Arn Medal   Tue Aug 22, 2021 10:18 PM) The patient has been taking this from a his supply brought from [place]  ?Ferrous Sulfate (IRON PO) [id number] Yes Take 25 mg by mouth daily. [provider] 08/22/2021 Active Multiple Informants  ?ibuprofen (ADVIL) 200 MG tablet [id number] Yes Take 200 mg by mouth every 6 (six) hours as needed for mild pain (or "to thin the blood"). [provider] 08/21/2021 Active Multiple Informants  ?metolazone (ZAROXOLYN) 2.5 MG tablet [id number] Yes Take 2.5 mg by mouth daily. [provider] 08/22/2021 am Active Multiple Informants  ?OVER THE COUNTER MEDICATION [id number] Yes Take 5 mg by mouth See admin instructions. Stator 5 mg - Take 5 mg by mouth at bedtime [provider] 08/21/2021 pm Active Multiple Informants  ?         ?Med Note Antony Madura, Arn Medal   Tue Aug 22, 2021 10:35 PM) From [place]  ?OVER THE COUNTER MEDICATION [id number] Yes Take 8 mg by mouth See admin instructions. Candor 8 mg- Take 8 mg by mouth once a day [provider] 08/22/2021 Active Multiple Informants  ?         ?Med Note Antony Madura, Arn Medal   Tue Aug 22, 2021 10:35 PM) From [place]  ?OVER THE COUNTER MEDICATION [id number] Yes Take 10 mg  by mouth See admin instructions. Lercapress 10 mg- Take 10 mg by mouth once a day [provider] 08/22/2021 am Active Multiple Informants  ?         ?Med Note [name], [name]   Tue Aug 22, 2021 10:35 PM) From [name]  ?tamsulosin (FLOMAX) 0.4 MG CAPS capsule [id number] Yes Take 0.4 mg by mouth at bedtime. [provider] 08/21/2021 pm Active Multiple Informants  ?XARELTO 15 MG TABS tablet [id number] Yes Take 15 mg by mouth in the morning. [provider] 08/22/2021 0800 Active Multiple Informants  ?          ?Med Note [name], [name]   Tue Aug 22, 2021 10:37 PM) The patient stated he takes this only once a day  ? ?  ?  ? ?  ? ? ? ?SDOH:  (Social Determinants of Health) assessments and interventions performed:  ? ? ?Care Plan ? ?Review of patient past medical history, allergies, medications, health status, including review of consultants reports, laboratory and other test data, was performed as part of comprehensive evaluation for care management services.  ? ?There are no care plans that you recently modified to display for this patient. ?  ? ?Plan: The patient has been provided with contact information for the care management team and has been advised to call with any health related questions or concerns.  ?The care management team will reach out to the patient again over the next 30+ business days. ? ?Sahvanna Mcmanigal L. Lavina Hamman, RN, BSN, CCM ?[place] Management Care Coordinator ?Office number [phone] ?Main [facility] number [phone] ?Fax number [phone] ? ? ? ? ? ?

## 2021-09-08 ENCOUNTER — Other Ambulatory Visit: Payer: Self-pay | Admitting: *Deleted

## 2021-09-08 NOTE — Patient Outreach (Signed)
[facility]) Care Management Telephonic RN Care Manager Note   09/08/2021 Name:  Zachary [facility] MRN:  [id number] DOB:  03/09/1928  Summary: Follow on 09/08/21 received EMMI red alert from Thursday 09/07/21 1000 Day #13  Feeling worse overall? Yes New problems walking/talking/speaking/seeing? Yes  Recommendations/Changes made from today's visit: Initial outreach to listed preferred number in EPIC, wife Barbara's number [phone] indicates the mailbox is full and a message to seen a e-mail to [email or web address] RN CM sent an e-mail   Second outreach attempt to [phone] the number Mrs Vista Deck reported as Mr Bos number. No answer. Mail box is full but allow a SMS message with RN CM office number to be sent      Subjective: Zachary Underwood is an 88 y.o. year old male who is a primary patient of Ellyn Hack, MD. The care management team was consulted for assistance with care management and/or care coordination needs.    Telephonic RN Care Manager completed Telephone Visit today.   Objective:  Medications Reviewed Today     Reviewed by Clinton Gallant, RN (Registered Nurse) on 09/04/21 at 1254  Med List Status: <None>   Medication Order Taking? Sig Documenting Provider Last Dose Status Informant  apixaban (ELIQUIS) 5 MG TABS tablet [id number]  Take 1 tablet (5 mg total) by mouth 2 (two) times daily. Maury Dus, MD  Active   atorvastatin (LIPITOR) 10 MG tablet [id number]  Take 1/2 tablet (5 mg total) by mouth daily. Maury Dus, MD  Active   CALCIUM PO [id number] No Take 300 mg by mouth daily. [provider] 08/22/2021 Active Multiple Informants  diazepam (VALIUM) 5 MG tablet [id number]  Take 0.5 tablets (2.5 mg total) by mouth every 12 (twelve) hours as needed for anxiety. McDiarmid, Leighton Roach, MD  Active   Ferrous Sulfate (IRON PO) [id number] No Take 25 mg by mouth daily. [provider] 08/22/2021 Active Multiple Informants   metolazone (ZAROXOLYN) 2.5 MG tablet [id number]  Take 1 tablet (2.5 mg total) by mouth daily. Maury Dus, MD  Active   OVER THE COUNTER MEDICATION [id number] No Take 8 mg by mouth See admin instructions. Candor 8 mg- Take 8 mg by mouth once a day [provider] 08/22/2021 Active Multiple Informants           Med Note Salvatore Marvel   Tue Aug 22, 2021 10:35 PM) From [place]  OVER THE COUNTER MEDICATION [id number] No Take 10 mg by mouth See admin instructions. Lercapress 10 mg- Take 10 mg by mouth once a day [provider] 08/22/2021 am Active Multiple Informants           Med Note Antony Madura, Arn Medal   Tue Aug 22, 2021 10:35 PM) From [place]  tamsulosin (FLOMAX) 0.4 MG CAPS capsule [id number] No Take 0.4 mg by mouth at bedtime. [provider] 08/21/2021 pm Active Multiple Informants             SDOH:  (Social Determinants of Health) assessments and interventions performed:    Care Plan  Review of patient past medical history, allergies, medications, health status, including review of consultants reports, laboratory and other test data, was performed as part of comprehensive evaluation for care management services.   There are no care plans that you recently modified to display for this patient.    Plan: The patient has been provided with contact information for the care management team and has been advised to call  with any health related questions or concerns.  The care management team will reach out to the patient again over the next 7+ business days.  Virgil Lightner L. Noelle Penner, RN, BSN, CCM [facility] Telephonic Care Management Care Coordinator Office number [phone] Main [facility] number [phone] Fax number [phone]

## 2021-09-11 ENCOUNTER — Other Ambulatory Visit: Payer: Self-pay | Admitting: *Deleted

## 2021-09-11 NOTE — Patient Outreach (Addendum)
[facility]) Care Management Telephonic RN Care Manager Note   09/26/2021 Name:  Zachary Underwood MRN:  [id number] DOB:  1927/11/07  Summary: Successful EMMI stroke follow outreach  Mrs Zachary Underwood updated RN Cm that the Zachary Underwood is now Zachary Underwood not Zachary Underwood Sudden joint pain resolved from last week But today voices concern of hypotension with a SBP of 107 average Ranging from 100-110  BP dropping now Reports Zachary Underwood is no longer on BP medicine and the only new medicine added to the regimen was the [place] reports Zachary Underwood seen on Friday but is not aware of this hypotension symptom   With assessment he was confirmed to be Staying hydrated He initially stated he was taking in 12 glasses of water then 10 No falls except the one he had on the day of the stroke and the one 2 days post Underwood discharge Today denies dizziness,  no bruising, no frank bleeding Appetite good & now with an intake of ensure  no chapped lips but reports an ongoing history of dry skin "always"    [facility] are active and has arrived to the home  SW visited today and were "terrific", PT visited last week and were "terrific " and  OT was reported as "okay "  With reported difficulty with reaching pt and wife at intervals they confirm the Pt's phone is a flip They have verizon and t mobile but have not been able to get out of the home to make upgrades to their phones. They request that all medical providers place frequent repeated calls to them as they do not always carry their phones everywhere they go in the home.  Mobility Zachary Underwood is using a Quad cane for ambulation with supervision and remains unsteady Preference continues for him not to complete activity of daily living tasks alone His e-mail address was provided to add to Demographics [email or web address]  Transportation  Used uber previously in [place] but reports "stood him up" Familiar  with seniors of [place] and has contacted them - Zachary Underwood reports she was informed they have not volunteer drivers at this time Agrees to a list of [employer] transportation resources to be e-mail to Zachary Underwood and voiced understanding that all services will require a charge depending on distance/company   They voice interest in hiring and paying Someone to assist to move boxes  Permission given to outreach to companies to assist them  Recommendations/Changes made from today's visit: Follow up outreach, found RN CM needing to re orient Zachary Underwood to Zachary Underwood and RN CM, reviewing previous outreaches Assessed for medical social determinants of health (SDOH) concerns/needs/hypotension, Encouraged hydration, s/s to report to pcp-permission given to update pt pcp  Outreach to [phone] Left a message for Zachary Underwood with Zachary Underwood requesting a return call to pt, wife or RN CM for possible move management assistance Left numbers for pt, wife or RN CM Outreach to [address] with [employer] that special Touch requesting a return call to pt, wife or RN CM for possible move management assistance Left numbers for pt, wife or RN CM Sent e-mails to [email or web address] Previous message left at [phone] requesting a possible outreach to Zachary Underwood [phone]) or wife Zachary Underwood [phone]) for move management assistance  Interested in unpacking and setting up of new home  E-mail sent to Zachary Underwood as requested by  wife with [place] transportation resources for elderly and to provided the names of the 2 move management companies that may outreach to them  Outreach to pcp office to request a message be sent to pcp on pt reported symptoms today   Subjective: Zachary Underwood is an 88 y.o. year old male who is a primary patient of Zachary Hatchet, NP. The care management team was consulted for assistance with care management  and/or care coordination needs.    Telephonic RN Care Manager completed Telephone Visit today.   Objective:  Medications Reviewed Today     Reviewed by Zachary Bombard, MD (Physician) on 09/20/21 at [facility] List Status: <None>   Medication Order Taking? Sig Documenting Provider Last Dose Status Informant  apixaban (ELIQUIS) 5 MG TABS tablet [id number]  Take 1 tablet (5 mg total) by mouth 2 (two) times daily. Zachary Dad, MD  Active   atorvastatin (LIPITOR) 10 MG tablet [id number] Yes Take 1/2 tablet (5 mg total) by mouth daily. Zachary Dad, MD Taking Active   diazepam (VALIUM) 5 MG tablet [id number] Yes Take 0.5 tablets (2.5 mg total) by mouth every 12 (twelve) hours as needed for anxiety. Zachary Underwood, Zachary Ohara, MD Taking Active   Ferrous Sulfate (IRON PO) [id number] Yes Take 25 mg by mouth daily. [provider] Taking Active Multiple Informants  metolazone (ZAROXOLYN) 2.5 MG tablet [id number] Yes Take 1 tablet (2.5 mg total) by mouth daily. Zachary Dad, MD Taking Active   OVER THE COUNTER MEDICATION [id number] Yes Take 8 mg by mouth See admin instructions. Candor 8 mg- Take 8 mg by mouth once a day [provider] Taking Active Multiple Informants           Med Note Zachary Underwood, Zachary Underwood   Tue Aug 22, 2021 10:35 PM) From [name]  OVER THE COUNTER MEDICATION [id number] Yes Take 10 mg by mouth See admin instructions. Lercapress 10 mg- Take 10 mg by mouth once a day [provider] Taking Active Multiple Informants           Med Note Zachary Underwood, Zachary Underwood   Tue Aug 22, 2021 10:35 PM) From [name]  Rivaroxaban (XARELTO) 15 MG TABS tablet [id number] Yes Take 15 mg by mouth 2 (two) times daily with a meal. [provider] Taking Active   tamsulosin (FLOMAX) 0.4 MG CAPS capsule [id number] Yes Take 0.4 mg by mouth at bedtime. [provider] Taking Active Multiple Informants             SDOH:  (Social Determinants of Health) assessments and interventions  performed:  SDOH Interventions    Flowsheet Row Most Recent Value  SDOH Interventions   Social Connections Interventions Intervention Not Indicated       Care Plan  Review of patient past medical history, allergies, medications, health status, including review of consultants reports, laboratory and other test data, was performed as part of comprehensive evaluation for care management services.   Care Plan : RN Care Manager Plan of Care  Updates made by [name], RN since 09/26/2021 12:00 AM     Problem: Complex Care Coordination Needs and disease management in patient with stroke,  falls, community resources   Priority: High  Onset Date: 08/31/2021     Goal: Establish Plan of Care for Management Complex SDOH Barriers, disease management and Care Coordination Needs in patient with strke, falls, community resources   Start Date: 08/31/2021  This Visit's Progress: Not on track  Priority: High  Note:   Current Barriers:  Knowledge Deficits related to plan of care for management of stroke, falls, community resources  Care Coordination needs related to Limited social support, Transportation, and Lacks knowledge of community resource: home care, move Teacher, early years/pre caregiver support Transportation barriers  RN CM Clinical Goal(s):  Patient will verbalize understanding of plan for management of stroke, fall prevention, use of community resources as evidenced by verbalization of decrease stroke/fall symptoms, and increase use/access to community resources through collaboration with Consulting civil engineer, provider, and care team.   Interventions: Outreaches for care coordination, disease management, resources, home care/education needs Inter-disciplinary care team collaboration (see longitudinal plan of care) Evaluation of current treatment plan related to  self management and patient's adherence to plan as established by provider   Falls Interventions:  (Status:  Goal Met) Short  Term Goal 09/11/21 No falls except the one he had on the day of the stroke and the one 2 days post Underwood discharge Today denies dizziness,  no bruising, no frank bleeding Reviewed medications and discussed potential side effects of medications such as dizziness and frequent urination Advised patient of importance of notifying provider of falls Assessed for signs and symptoms of orthostatic hypotension Assessed for falls since last encounter Assessed patients knowledge of fall risk prevention secondary to previously provided education Screening for signs and symptoms of depression related to chronic disease state  Assessed social determinant of health barriers   Stroke:  (Status:Goal on track:  Yes.) Long Term Goal 09/11/21 voices concern of hypotension with a SBP of 107 average Ranging from 100-110  BP dropping now Reports Zachary Underwood is no longer on BP medicine and the only new medicine added to the regimen was the [employer] reports Zachary Underwood seen on Friday but is not aware of this hypotension symptom   With assessment he was confirmed to be Staying hydrated He initially stated he was taking in 12 glasses of water then 10 No falls except the one he had on the day of the stroke and the one 2 days post Underwood discharge Today denies dizziness,  no bruising, no frank bleeding Appetite good & now with an intake of ensure  no chapped lips but reports an ongoing history of dry skin "always"  Reviewed Importance of attending all scheduled provider appointments Assessed social determinant of health barriers Assessed for signs and symptoms of stroke Reviewed referrals to home health Reviewed referrals to outpatient therapy Assessed for cognitive impairment Assessed for fall status and safety in the home 09/11/21 Outreach to pcp office to request a message be sent to pcp on pt reported symptoms today    Interdisciplinary Collaboration Interventions:  (Status: Goal on track:  Yes.)  Short Term Goal   Update: 09/11/21  [facility] pcp is now Zachary Underwood not Zachary Underwood Sudden joint pain resolved from last week [facility] are active and has arrived to the home  SW visited today and were "terrific", PT visited last week and were "terrific " and  OT was reported as "okay " Collaborated with RN CM/local community resources/pcp SW+ to initiate plan of care to address needs related to Limited social support, Transportation, and Lacks knowledge of community resource: home care, move management services, transportation services in patient with stroke, falls  09/11/21 Outreach to [phone] Left a message for Zachary Underwood with Zachary Underwood requesting a return call to pt, wife or RN CM for possible move management assistance Left numbers  for pt, wife or RN CM Outreach to [address] with [employer] that special Touch requesting a return call to pt, wife or RN CM for possible move management assistance Left numbers for pt, wife or RN CM Sent e-mails to [email or web address] Previous message left at [phone] requesting a possible outreach to Zachary Underwood [phone]) or wife Zachary Underwood [phone]) for move management assistance  Interested in unpacking and setting up of new home  E-mail sent to Zachary Underwood as requested by wife with [place] transportation resources for elderly and to provided the names of the 2 move management companies that may outreach to them   Patient Goals/Self-Care Activities: Take all medications as prescribed Attend all scheduled provider appointments Perform all self care activities independently  Call provider office for new concerns or questions  Work with the social worker to address care coordination needs and will continue to work with the clinical team to address health care and disease management related needs  Follow Up Plan:  The patient has been provided  with contact information for the care management team and has been advised to call with any health related questions or concerns.  The care management team will reach out to the patient again over the next 30 business  days.    Problem: error Resolved 09/26/2021  Priority: High  Onset Date: 08/31/2021  Note:   Error in opening second problem Learta Codding RN CM       Plan: The patient has been provided with contact information for the care management team and has been advised to call with any health related questions or concerns.  The care management team will reach out to the patient again over the next 30+ business days. To prepare for case closure   Lahna Nath L. Lavina Hamman, RN, BSN, [place] Coordinator Office number [phone] [facility] number [phone] Fax number [phone]

## 2021-09-12 ENCOUNTER — Other Ambulatory Visit (HOSPITAL_BASED_OUTPATIENT_CLINIC_OR_DEPARTMENT_OTHER): Payer: Self-pay

## 2021-09-12 ENCOUNTER — Telehealth (HOSPITAL_BASED_OUTPATIENT_CLINIC_OR_DEPARTMENT_OTHER): Payer: Self-pay

## 2021-09-12 NOTE — Telephone Encounter (Signed)
Pharmacy Transitions of Care Follow-up Telephone Call  Date of discharge: 08/24/21  Discharge Diagnosis: Stroke  How have you been since you were released from the hospital? Patient has been good since leaving the hospital. His doctor's at [facility] have recommended he stop his Eliquis and restart Xarelto starting tomorrow, his blood work has also indicated high calcium, so he is stopping the supplement.    Medication changes made at discharge: START taking these medications  START taking these medications  atorvastatin 10 MG tablet Commonly known as: LIPITOR Take 1/2 tablet (5 mg total) by mouth daily.  Eliquis 5 MG Tabs tablet Generic drug: apixaban Take 1 tablet (5 mg total) by mouth 2 (two) times daily.   CONTINUE taking these medications  CONTINUE taking these medications  CALCIUM PO  IRON PO  metolazone 2.5 MG tablet Commonly known as: ZAROXOLYN Take 1 tablet (2.5 mg total) by mouth daily.  OVER THE COUNTER MEDICATION  OVER THE COUNTER MEDICATION  tamsulosin 0.4 MG Caps capsule Commonly known as: FLOMAX   STOP taking these medications  STOP taking these medications  diazepam 5 MG tablet Commonly known as: VALIUM  ibuprofen 200 MG tablet Commonly known as: ADVIL  OVER THE COUNTER MEDICATION  Xarelto 15 MG Tabs tablet Generic drug: Rivaroxaban    Medication changes verified by the patient? Yes  Medication Review: APIXABAN (ELIQUIS)  Apixaban 5 mg BID   - Discussed importance of taking medication around the same time everyday  - Reviewed potential DDIs with patient  - Advised patient of medications to avoid (NSAIDs, ASA)  - Educated that Tylenol (acetaminophen) will be the preferred analgesic to prevent risk of bleeding  - Emphasized importance of monitoring for signs and symptoms of bleeding (abnormal bruising, prolonged bleeding, nose bleeds, bleeding from gums, discolored urine, black tarry stools)  - Advised patient to alert all providers of  anticoagulation therapy prior to starting a new medication or having a procedure   Follow-up Appointments: Date Visit Type Length Department    09/20/2021  8:00 AM HOSPITAL FU 30 min [facility] [id number]    If their condition worsens, is the pt aware to call PCP or go to the Emergency Dept.? Yes  Final Patient Assessment: Patient has been good since leaving the hospital. His doctor's at [facility] have recommended he stop his Eliquis and restart Xarelto starting tomorrow, his blood work has also indicated high calcium, so he is stopping the supplement.    Darcus Austin, [place] 09/12/2021 12:18 PM

## 2021-09-20 ENCOUNTER — Ambulatory Visit (INDEPENDENT_AMBULATORY_CARE_PROVIDER_SITE_OTHER): Payer: Medicare Other | Admitting: Diagnostic Neuroimaging

## 2021-09-20 ENCOUNTER — Inpatient Hospital Stay: Payer: Medicare Other | Admitting: Family Medicine

## 2021-09-20 ENCOUNTER — Encounter: Payer: Self-pay | Admitting: Diagnostic Neuroimaging

## 2021-09-20 VITALS — BP 133/75 | HR 59 | Ht 67.0 in | Wt 179.6 lb

## 2021-09-20 DIAGNOSIS — I63412 Cerebral infarction due to embolism of left middle cerebral artery: Secondary | ICD-10-CM

## 2021-09-20 DIAGNOSIS — I6340 Cerebral infarction due to embolism of unspecified cerebral artery: Secondary | ICD-10-CM

## 2021-09-20 NOTE — Progress Notes (Signed)
[facility]  PATIENT: Zachary Underwood DOB: Aug 25, 1927  REFERRING CLINICIAN: Rosalin Hawking, MD HISTORY FROM: patient REASON FOR VISIT: new consult   HISTORICAL  CHIEF COMPLAINT:  Chief Complaint  Patient presents with   Cerebrovascular Accident    Rm [place]  wife- Zachary Underwood "getting therapy weekly, want to review MRI, having balance issues; fell 4 days before stroke and hit head"    HISTORY OF PRESENT ILLNESS:   UPDATE (09/20/21, VRP): here for hospital follow up from stroke.  88 year old male with history of paroxysmal atrial fibrillation on Xarelto, hyperlipidemia, hypertension, presented to hospital with right hand weakness and speech difficulty. Patient presented to hospital 08/22/2021 for the symptoms.  Several days prior patient had fallen down and hit his head on the left side.  He had some intermittent speech and language difficulties.  Patient was under high stress related to recently moving from [name] to [place].  Patient was admitted for stroke evaluation.  Xarelto was changed to Eliquis.  Since discharge patient had some itching sensation with Eliquis and therefore went back to Xarelto.  Having some ongoing issues with comprehension, hearing, blood pressure fluctuations.    REVIEW OF SYSTEMS: Full 14 system review of systems performed and negative with exception of: as per HPI.  ALLERGIES: Allergies  Allergen Reactions   Beef-Derived Products     Patient does not eat beef    Other Other (See Comments) and Hypertension    Fusid- Legs became very swollen   Tylenol [Acetaminophen] Hives    HOME MEDICATIONS: Outpatient Medications Prior to Visit  Medication Sig Dispense Refill   atorvastatin (LIPITOR) 10 MG tablet Take 1/2 tablet (5 mg total) by mouth daily. 30 tablet 0   diazepam (VALIUM) 5 MG tablet Take 0.5 tablets (2.5 mg total) by mouth every 12 (twelve) hours as needed for anxiety. 30 tablet 0   Ferrous Sulfate (IRON PO) Take 25 mg  by mouth daily.     metolazone (ZAROXOLYN) 2.5 MG tablet Take 1 tablet (2.5 mg total) by mouth daily. 30 tablet 0   OVER THE COUNTER MEDICATION Take 8 mg by mouth See admin instructions. Candor 8 mg- Take 8 mg by mouth once a day     OVER THE COUNTER MEDICATION Take 10 mg by mouth See admin instructions. Lercapress 10 mg- Take 10 mg by mouth once a day     Rivaroxaban (XARELTO) 15 MG TABS tablet Take 15 mg by mouth 2 (two) times daily with a meal.     tamsulosin (FLOMAX) 0.4 MG CAPS capsule Take 0.4 mg by mouth at bedtime.     apixaban (ELIQUIS) 5 MG TABS tablet Take 1 tablet (5 mg total) by mouth 2 (two) times daily. 60 tablet 0   CALCIUM PO Take 300 mg by mouth daily.     No facility-administered medications prior to visit.      PHYSICAL EXAM  GENERAL EXAM/CONSTITUTIONAL: Vitals:  Vitals:   09/20/21 1048  BP: 133/75  Pulse: (!) 59  Weight: 179 lb 9.6 oz (81.5 kg)  Height: 5\' 7"  (1.702 m)   Body mass index is 28.13 kg/m. Wt Readings from Last 3 Encounters:  09/20/21 179 lb 9.6 oz (81.5 kg)  08/23/21 168 lb 6.9 oz (76.4 kg)   Patient is in no distress; well developed, nourished and groomed; neck is supple  CARDIOVASCULAR: Examination of carotid arteries is normal; no carotid bruits Regular rate and rhythm, no murmurs Examination of peripheral vascular system by observation and  palpation is normal  EYES: Ophthalmoscopic exam of optic discs and posterior segments is normal; no papilledema or hemorrhages No results found.  MUSCULOSKELETAL: Gait, strength, tone, movements noted in Neurologic exam below  NEUROLOGIC: MENTAL STATUS:      View : No data to display.         awake, alert, oriented to person, place and time recent and remote memory intact normal attention and concentration language fluent, comprehension intact, naming intact fund of knowledge appropriate  CRANIAL NERVE:  2nd - no papilledema on fundoscopic exam 2nd, 3rd, 4th, 6th - pupils equal and  reactive to light, visual fields full to confrontation, extraocular muscles intact, no nystagmus 5th - facial sensation symmetric 7th - facial strength symmetric 8th - hearing intact 9th - palate elevates symmetrically, uvula midline 11th - shoulder shrug symmetric 12th - tongue protrusion midline  MOTOR:  normal bulk and tone, full strength in the BUE, BLE  SENSORY:  normal and symmetric to light touch, temperature, vibration; SLIGHTLY DECR IN TOES  COORDINATION:  finger-nose-finger, fine finger movements normal  REFLEXES:  deep tendon reflexes TRACE and symmetric  GAIT/STATION:  narrow based gait; USE CANE     DIAGNOSTIC DATA (LABS, IMAGING, TESTING) - I reviewed patient records, labs, notes, testing and imaging myself where available.  Lab Results  Component Value Date   WBC 6.5 08/22/2021   HGB 13.1 08/22/2021   HCT 39.8 08/22/2021   MCV 96.8 08/22/2021   PLT 192 08/22/2021      Component Value Date/Time   NA 136 08/23/2021 0552   K 3.5 08/23/2021 0552   CL 102 08/23/2021 0552   CO2 24 08/23/2021 0552   GLUCOSE 98 08/23/2021 0552   BUN 25 (H) 08/23/2021 0552   CREATININE 1.22 08/23/2021 0552   CALCIUM 10.0 08/23/2021 0552   GFRNONAA 56 (L) 08/23/2021 0552   Lab Results  Component Value Date   CHOL 110 08/23/2021   HDL 36 (L) 08/23/2021   LDLCALC 54 08/23/2021   TRIG 100 08/23/2021   CHOLHDL 3.1 08/23/2021   Lab Results  Component Value Date   HGBA1C 5.4 08/23/2021   No results found for: VITAMINB12 Lab Results  Component Value Date   TSH 3.062 08/23/2021      08/23/21 TTE  No intracardiac source of embolism  detected on this transthoracic study. Consider a transesophageal  echocardiogram to exclude cardiac source of embolism if clinically  indicated.   08/22/21 MRI brain [I reviewed images myself and agree with interpretation. -VRP]  1. Multiple small foci of acute/early subacute ischemia within the anterior left parietal lobe. No  hemorrhage or mass effect. 2. Old left cerebellar infarct.  08/23/21 CTA head / neck  1. No intracranial arterial occlusion or high-grade stenosis. 2. Bilateral carotid bifurcation atherosclerosis resulting in 50-65% stenosis of the proximal internal carotid arteries bilaterally.    ASSESSMENT AND PLAN  88 y.o. year old male here with:   Dx:  1. Cerebrovascular accident (CVA) due to embolism of left middle cerebral artery (HCC)      PLAN:  Stroke:  left parietal 3-4 punctate infarcts embolic likely secondary to A-fib failed Xarelto CT no acute abnormality CTA head and neck bilateral ICA 50 to 65% stenosis MRI old cerebellar infarct, acute punctate left parietal 3-4 infarcts 2D Echo 70 to 75% LDL 54 HgbA1c 5.4 Xarelto (rivaroxaban) daily prior to admission, tried eliquis but had itching side effect, so now back on xarelto; follow up with cardiology Ongoing aggressive stroke risk factor  management   Hypertension Long term BP goal normotensive   Hyperlipidemia Home meds: None LDL 54, goal < 70 Continue atorvastatin 5mg  daily    Return for return to PCP.    Penni Bombard, MD [id number], [id number] AM Certified in Neurology, Neurophysiology and Neuroimaging  [facility] [address], [place], [place] [address] [phone]

## 2021-09-26 ENCOUNTER — Other Ambulatory Visit: Payer: Self-pay | Admitting: *Deleted

## 2021-09-26 NOTE — Patient Outreach (Signed)
Zachary Underwood) Care Management Telephonic RN Care Manager Note   09/26/2021 Name:  Zachary Underwood MRN:  [id number] DOB:  06-05-1927  Summary: Attempt to reach pt/wife to get updated on status community resources, resolution for hypotension/Eliquis concern and for EMMI case closure [facility] Unsuccessful outreach Outreach attempt to the listed at the preferred outreach number in EPIC [phone] 2 and [phone] x [phone]  No answer. Unable to leave voice messages as [phone] voice mail box is full and [phone] calls can not go through  Outreach to [name] spoke with Cary at [phone] to review possible correct numbers to reach this patient. She confirms RN CM has dialed all correct numbers for the patient and wife . She also confirmed the wife has called the office in the last 1-2 weeks and has used the above numbers as outreach numbers for the pt  Attempted to dial [phone] the second time with Constance Holster in a conference call   Subjective: Zachary Underwood is an 88 y.o. year old male who is a primary patient of [name], NP. The care management team was consulted for assistance with care management and/or care coordination needs.    Telephonic RN Care Manager completed Telephone Visit today.   Objective:  Medications Reviewed Today     Reviewed by Penni Bombard, MD (Physician) on 09/20/21 at [place] List Status: <None>   Medication Order Taking? Sig Documenting Provider Last Dose Status Informant  apixaban (ELIQUIS) 5 MG TABS tablet [id number]  Take 1 tablet (5 mg total) by mouth 2 (two) times daily. [name], MD  Active   atorvastatin (LIPITOR) 10 MG tablet [id number] Yes Take 1/2 tablet (5 mg total) by mouth daily. [name], MD Taking Active   diazepam (VALIUM) 5 MG tablet [id number] Yes Take 0.5 tablets (2.5 mg total) by mouth every 12 (twelve) hours as needed for anxiety. McDiarmid, Blane Ohara, MD Taking Active   Ferrous Sulfate (IRON  PO) [id number] Yes Take 25 mg by mouth daily. [provider] Taking Active Multiple Informants  metolazone (ZAROXOLYN) 2.5 MG tablet [id number] Yes Take 1 tablet (2.5 mg total) by mouth daily. [name], MD Taking Active   OVER THE COUNTER MEDICATION [id number] Yes Take 8 mg by mouth See admin instructions. Candor 8 mg- Take 8 mg by mouth once a day [provider] Taking Active Multiple Informants           Med Note [name], [name]   Tue Aug 22, 2021 10:35 PM) From [name]  OVER THE COUNTER MEDICATION [id number] Yes Take 10 mg by mouth See admin instructions. Lercapress 10 mg- Take 10 mg by mouth once a day [provider] Taking Active Multiple Informants           Med Note [name], [name]   Tue Aug 22, 2021 10:35 PM) From [name]  Rivaroxaban (XARELTO) 15 MG TABS tablet [id number] Yes Take 15 mg by mouth 2 (two) times daily with a meal. [provider] Taking Active   tamsulosin (FLOMAX) 0.4 MG CAPS capsule [id number] Yes Take 0.4 mg by mouth at bedtime. [provider] Taking Active Multiple Informants             SDOH:  (Social Determinants of Health) assessments and interventions performed:    Care Plan  Review of patient past medical history, allergies, medications, health status, including review of consultants reports, laboratory  and other test data, was performed as part of comprehensive evaluation for care management services.   Care Plan : RN Care Manager Plan of Care  Updates made by [name], RN since 09/26/2021 12:00 AM     Problem: Complex Care Coordination Needs and disease management in patient with stroke,  falls, community resources   Priority: High  Onset Date: 08/31/2021     Goal: Establish Plan of Care for Management Complex SDOH Barriers, disease management and Care Coordination Needs in patient with strke, falls, community resources   Start Date: 08/31/2021  Recent Progress: Not on track  Priority: High  Note:    Current Barriers:  Knowledge Deficits related to plan of care for management of stroke, falls, community resources  Care Coordination needs related to Limited social support, Transportation, and Lacks knowledge of community resource: home care, move management  Lacks caregiver support Transportation barriers Outreach engagement barriers- Wife phone with full voice mail box, Pt does not answer his phone, Wife does not check emails  09/26/21 unsuccessful outreach attempts, call to pcp office to verify numbers and to update office on pending EMMI closure Encouraged office SW referral as needed   RN CM Clinical Goal(s):  Patient will verbalize understanding of plan for management of stroke, fall prevention, use of community resources as evidenced by verbalization of decrease stroke/fall symptoms, and increase use/access to community resources through collaboration with Consulting civil engineer, provider, and care team.   Interventions: Outreaches for care coordination, disease management, resources, home care/education needs Inter-disciplinary care team collaboration (see longitudinal plan of care) Evaluation of current treatment plan related to  self management and patient's adherence to plan as established by provider   Falls Interventions:  (Status:  Goal Met) Short Term Goal 09/11/21 No falls except the one he had on the day of the stroke and the one 2 days post hospital discharge Today denies dizziness,  no bruising, no frank bleeding Reviewed medications and discussed potential side effects of medications such as dizziness and frequent urination Advised patient of importance of notifying provider of falls Assessed for signs and symptoms of orthostatic hypotension Assessed for falls since last encounter Assessed patients knowledge of fall risk prevention secondary to previously provided education Screening for signs and symptoms of depression related to chronic disease state  Assessed social  determinant of health barriers   Stroke:  (Status:Goal on track:  Yes.) Long Term Goal 09/11/21 voices concern of hypotension with a SBP of 107 average Ranging from 100-110  BP dropping now Reports [name] is no longer on BP medicine and the only new medicine added to the regimen was the [employer] reports [name] seen on Friday but is not aware of this hypotension symptom   With assessment he was confirmed to be Staying hydrated He initially stated he was taking in 12 glasses of water then 10 No falls except the one he had on the day of the stroke and the one 2 days post hospital discharge Today denies dizziness,  no bruising, no frank bleeding Appetite good & now with an intake of ensure  no chapped lips but reports an ongoing history of dry skin "always"  Reviewed Importance of attending all scheduled provider appointments Assessed social determinant of health barriers Assessed for signs and symptoms of stroke Reviewed referrals to home health Reviewed referrals to outpatient therapy Assessed for cognitive impairment Assessed for fall status and safety in the home 09/11/21 Outreach to pcp office to request a message  be sent to pcp on pt reported symptoms today    Interdisciplinary Collaboration Interventions:  (Status: Goal on track:  Yes.) Short Term Goal   Update: 09/11/21  [place] is now [name] not [name] Sudden joint pain resolved from last week [facility] are active and has arrived to the home  SW visited today and were "terrific", PT visited last week and were "terrific " and  OT was reported as "okay " Collaborated with RN CM/local community resources/pcp SW+ to initiate plan of care to address needs related to Limited social support, Transportation, and Lacks knowledge of community resource: home care, move management services, transportation services in patient with stroke, falls  09/11/21 Outreach to [phone] Left a  message for [name] with [name] requesting a return call to pt, wife or RN CM for possible move management assistance Left numbers for pt, wife or RN CM Outreach to [address] [place] with [employer] that special Touch requesting a return call to pt, wife or RN CM for possible move management assistance Left numbers for pt, wife or RN CM Sent e-mails to [email or web address] Previous message left at [phone] requesting a possible outreach to [name] [phone]) or wife [name] [phone]) for move management assistance  Interested in unpacking and setting up of new home  E-mail sent to [name] as requested by wife with [place] transportation resources for elderly and to provided the names of the 2 move management companies that may outreach to them   Patient Goals/Self-Care Activities: Take all medications as prescribed Attend all scheduled provider appointments Perform all self care activities independently  Call provider office for new concerns or questions  Work with the social worker to address care coordination needs and will continue to work with the clinical team to address health care and disease management related needs  Follow Up Plan:  The patient has been provided with contact information for the care management team and has been advised to call with any health related questions or concerns.  The care management team will reach out to the patient again over the next 30 business  days.    Problem: error Resolved 09/26/2021  Priority: High  Onset Date: 08/31/2021  Note:   Error in opening second problem Learta Codding RN CM       Plan: The patient has been provided with contact information for the care management team and has been advised to call with any health related questions or concerns.  Mailed an unsuccessful letter indicating EMMI services ending and inquiring if any further identified  needs exist Pending possible call from wife/pt If no return call case will be closed per EMMI program criteria  Joelene Millin L. Lavina Hamman, RN, BSN, [place] Coordinator Office number [phone] Main [facility] number [phone] Fax number [phone]

## 2021-10-02 ENCOUNTER — Other Ambulatory Visit (HOSPITAL_COMMUNITY): Payer: Self-pay

## 2021-10-03 ENCOUNTER — Inpatient Hospital Stay: Payer: Medicare Other | Admitting: Adult Health

## 2021-10-04 ENCOUNTER — Other Ambulatory Visit: Payer: Self-pay | Admitting: *Deleted

## 2021-10-04 NOTE — Patient Outreach (Signed)
[facility]) Care Management Telephonic RN Care Manager Note   10/09/2021 Name:  Zachary Underwood MRN:  [id number] DOB:  [birth year]  Summary: Unsuccessful call outreaches x 2 to [phone] after pt left voice messages after he received RN Cm's 09/26/21 letter after unsuccessful outreaches to him and his wife for final EMMI stroke program follow up care. Case closure on 09/29/21  Eventually had a successful Outreach to his wife number [phone] today Updated wife and pt that [facility] EMMI services concluded on 09/29/21 and RN CM had been attempting to follow up without success on previous transportation and move management resources. Updated them that RN CM outreached to the pcp office after not being able to reach them to request pt be connected or referred to his pcp Education officer, museum (SW) for any further social determinants of health (SDOH) need Wife spoke with pcp SW already but reports no assistance at this time   Pt and wife voiced understanding that [facility] EMMI program has concluded and that further services can be obtained via pcp SW RN CM discussed 09/26/21 unsuccessful pt outreaches but successful outreach to pcp office to [place] [phone] first to confirm outreach numbers and then to inquire about pcp connection for pt They agreed to speak with the SW at pcp office prn   He reports continued issues with his phone- Stage manager) They confirmed success with the use of one of the move management services RN CM provided care coordination for   Recommendations/Changes made from today's visit: Case closure after speaking with pt and wife when was successfully able to reach them. Reviewed the goals met/resources RN CM assisted with. Referred them to their external care management/SW vendor (pcp) for further services   Subjective: Zachary Underwood is an 88 y.o. year old male who is a primary patient of [name], NP. The care management team was consulted for assistance with care  management and/or care coordination needs.    Telephonic RN Care Manager completed Telephone Visit today.   Objective:  Medications Reviewed Today     Reviewed by Penni Bombard, MD (Physician) on 09/20/21 at [place] List Status: <None>   Medication Order Taking? Sig Documenting Provider Last Dose Status Informant  apixaban (ELIQUIS) 5 MG TABS tablet [id number]  Take 1 tablet (5 mg total) by mouth 2 (two) times daily. [name], MD  Active   atorvastatin (LIPITOR) 10 MG tablet [id number] Yes Take 1/2 tablet (5 mg total) by mouth daily. [name], MD Taking Active   diazepam (VALIUM) 5 MG tablet [id number] Yes Take 0.5 tablets (2.5 mg total) by mouth every 12 (twelve) hours as needed for anxiety. McDiarmid, Blane Ohara, MD Taking Active   Ferrous Sulfate (IRON PO) [id number] Yes Take 25 mg by mouth daily. [provider] Taking Active Multiple Informants  metolazone (ZAROXOLYN) 2.5 MG tablet [id number] Yes Take 1 tablet (2.5 mg total) by mouth daily. [name], MD Taking Active   OVER THE COUNTER MEDICATION [id number] Yes Take 8 mg by mouth See admin instructions. Candor 8 mg- Take 8 mg by mouth once a day [provider] Taking Active Multiple Informants           Med Note [name], [name]   Tue Aug 22, 2021 10:35 PM) From [name]  OVER THE COUNTER MEDICATION [id number] Yes Take 10 mg by mouth See admin instructions. Lercapress 10 mg- Take 10 mg by mouth once a day [provider]  Taking Active Multiple Informants           Med Note [name], [name]   Tue Aug 22, 2021 10:35 PM) From [name]  Rivaroxaban (XARELTO) 15 MG TABS tablet [id number] Yes Take 15 mg by mouth 2 (two) times daily with a meal. [provider] Taking Active   tamsulosin (FLOMAX) 0.4 MG CAPS capsule [id number] Yes Take 0.4 mg by mouth at bedtime. [provider] Taking Active Multiple Informants             SDOH:  (Social Determinants of Health) assessments and  interventions performed:    Care Plan  Review of patient past medical history, allergies, medications, health status, including review of consultants reports, laboratory and other test data, was performed as part of comprehensive evaluation for care management services.   Care Plan : RN Care Manager Plan of Care  Updates made by [name], RN since 10/09/2021 12:00 AM  Completed 10/09/2021   Problem: Complex Care Coordination Needs and disease management in patient with stroke,  falls, community resources Resolved 09/29/2021  Priority: High  Onset Date: 08/31/2021     Goal: Establish Plan of Care for Management Complex SDOH Barriers, disease management and Care Coordination Needs in patient with strke, falls, community resources Completed 10/04/2021  Start Date: 08/31/2021  This Visit's Progress: On track  Recent Progress: Not on track  Priority: High  Note:   Current Barriers:  Knowledge Deficits related to plan of care for management of stroke, falls, community resources  Care Coordination needs related to Limited social support, Transportation, and Lacks knowledge of community resource: home care, move Teacher, early years/pre caregiver support Transportation barriers Outreach engagement barriers- Wife phone with full voice mail box, Pt does not answer his phone, Wife does not check emails  09/26/21 unsuccessful outreach attempts, call to pcp office to verify numbers and to update office on pending EMMI closure (limited 30 EMMI program) Encouraged office SW referral as needed   Unsuccessful call outreaches x 2 to [phone] after pt left voice messages after he received RN Cm's 09/26/21 letter after unsuccessful outreaches to him and his wife for final EMMI stroke program follow up care.   RN CM Clinical Goal(s):  Patient will verbalize understanding of plan for management of stroke, fall prevention, use of community resources as evidenced by verbalization of decrease stroke/fall symptoms,  and increase use/access to community resources through collaboration with Consulting civil engineer, provider, and care team.   Interventions: Outreaches for care coordination, disease management, resources, home care/education needs Inter-disciplinary care team collaboration (see longitudinal plan of care) Evaluation of current treatment plan related to  self management and patient's adherence to plan as established by provider 10/04/21 Updated wife and pt that [facility] EMMI services concluded on 09/29/21 and RN CM had been attempting to follow up without success on previous transportation and move management resources. Updated them that RN CM outreached to the pcp office after not being able to reach them to request pt be connected or referred to his pcp Education officer, museum (SW) for any further social determinants of health (SDOH) need Wife spoke with pcp SW already but reports no assistance at this time  Pt and wife voiced understanding that [facility] EMMI program has concluded and that further services can be obtained via pcp SW They agreed to speak with the SW at pcp office prn    Falls Interventions:  (Status:  Goal Met) Short Term Goal 09/11/21 No falls except the one  he had on the day of the stroke and the one 2 days post hospital discharge Today denies dizziness,  no bruising, no frank bleeding Reviewed medications and discussed potential side effects of medications such as dizziness and frequent urination Advised patient of importance of notifying provider of falls Assessed for signs and symptoms of orthostatic hypotension Assessed for falls since last encounter Assessed patients knowledge of fall risk prevention secondary to previously provided education Screening for signs and symptoms of depression related to chronic disease state  Assessed social determinant of health barriers   Stroke:  (Status:Goal on track:  Yes.) Long Term Goal 09/11/21 voices concern of hypotension with a SBP of 107 average Ranging from  100-110  BP dropping now Reports [name] is no longer on BP medicine and the only new medicine added to the regimen was the [place] reports [name] seen on Friday but is not aware of this hypotension symptom   With assessment he was confirmed to be Staying hydrated He initially stated he was taking in 12 glasses of water then 10 No falls except the one he had on the day of the stroke and the one 2 days post hospital discharge Today denies dizziness,  no bruising, no frank bleeding Appetite good & now with an intake of ensure  no chapped lips but reports an ongoing history of dry skin "always"  Reviewed Importance of attending all scheduled provider appointments Assessed social determinant of health barriers Assessed for signs and symptoms of stroke Reviewed referrals to home health Reviewed referrals to outpatient therapy Assessed for cognitive impairment Assessed for fall status and safety in the home 09/11/21 Outreach to pcp office to request a message be sent to pcp on pt reported symptoms today    Interdisciplinary Collaboration Interventions:  (Status: Goal met) Short Term Goal   Update: 09/11/21  [facility] p[facility] is now [name] not [name] Sudden joint pain resolved from last week [facility] are active and has arrived to the home  SW visited today and were "terrific", PT visited last week and were "terrific " and  OT was reported as "okay " Collaborated with RN CM/local community resources/pcp SW+ to initiate plan of care to address needs related to Limited social support, Transportation, and Lacks knowledge of community resource: home care, move management services, transportation services in patient with stroke, falls  09/11/21 Outreach to [phone] Left a message for [name] with [name] requesting a return call to pt, wife or RN CM for possible move management assistance Left numbers for pt, wife or RN CM Outreach to [address] with [employer] that special Touch requesting a return call to pt, wife or RN CM for possible move management assistance Left numbers for pt, wife or RN CM Sent e-mails to [email or web address] Previous message left at [phone] requesting a possible outreach to [name] [phone]) or wife [name] [phone]) for move management assistance  Interested in unpacking and setting up of new home  E-mail sent to [name] as requested by wife with [place] transportation resources for elderly and to provided the names of the 2 move management companies that may outreach to them  10/04/21 He reports continued issues with his phone- Stage manager) They confirmed success with the use of one of the move management services RN CM provided care coordination for  Patient Goals/Self-Care Activities: Take  all medications as prescribed Attend all scheduled provider appointments Perform all self care activities independently  Call provider office for new concerns or questions  Work with the social worker to address care coordination needs and will continue to work with the clinical team to address health care and disease management related needs  Follow Up Plan:  The patient has been provided with contact information for the care management team and has been advised to call with any health related questions or concerns.       Plan: The patient has been provided with contact information for the care management team and has been advised to call with any health related questions or concerns.  No further follow up required: EMMI case closure  Oceane Fosse L. Lavina Hamman, RN, BSN, [place] Coordinator Office number [phone] Main [facility] number [phone] Fax number [phone]

## 2022-01-23 ENCOUNTER — Ambulatory Visit: Payer: Medicare Other | Admitting: Cardiovascular Disease

## 2022-02-13 ENCOUNTER — Ambulatory Visit: Payer: Medicare Other | Admitting: Cardiovascular Disease

## 2022-02-19 ENCOUNTER — Telehealth: Payer: Self-pay

## 2022-02-19 NOTE — Telephone Encounter (Signed)
We received a phone call from this patient and his wife requesting an urgent appointment at our office for a "sequela of his CVA". When I advised I could get him scheduled with Dr Leta Baptist for a follow up, they asked if he could transfer his care to Dr Krista Blue at our office. If approved, they are requesting to be seen urgently within the next few weeks.  Please advise.

## 2022-02-19 NOTE — Telephone Encounter (Signed)
error 

## 2022-02-20 NOTE — Telephone Encounter (Signed)
Wife returned call and stated patient had sudden decrease in hearing, saw audiologist who sent him to ENT. ENT prescribed cortisone which worked for a while. Her current concern is his  cognitive issues and executive functioning. He reports difficulty focusing, concentrating on auditory (not visual)  level, difficulty sequencing judgements on higher level  of cognitive function. His PCP defers to neurology to make recommendations. PCP suggested Aricept  however he has serious GI issues, so wife felt it was not a good choice. She feel he needs medication to help with cognitive function and focusing. She requested a provider switch to Dr Krista Blue, and wants him seen soon to get onto medication.  I advised her the message will be sent to both providers, and she'll get a call back. She verbalized understanding, appreciation.

## 2022-02-20 NOTE — Telephone Encounter (Signed)
Patient called back to check on this he said the best number to call is his wife's but if no answer, can call his number as well everyone at lunch, but let him know someone would be giving him a call back

## 2022-02-20 NOTE — Telephone Encounter (Signed)
Called [name], wife , no answer and voice MB full.

## 2022-02-21 ENCOUNTER — Encounter: Payer: Self-pay | Admitting: Neurology

## 2022-02-21 NOTE — Telephone Encounter (Signed)
Ok to put on my schedule for next available. 

## 2022-02-21 NOTE — Progress Notes (Signed)
Please let Dr. Stark Klein  help this patient, He should continue follow up with Dr. Stark Klein

## 2022-02-22 NOTE — Telephone Encounter (Signed)
FYI " Patient called back and was placed on Dr Rhea Belton schedule for 12/19 at 11:00

## 2022-02-28 ENCOUNTER — Ambulatory Visit: Payer: Medicare Other | Attending: Cardiovascular Disease | Admitting: Cardiovascular Disease

## 2022-02-28 ENCOUNTER — Encounter: Payer: Self-pay | Admitting: Cardiovascular Disease

## 2022-02-28 VITALS — BP 111/69 | HR 64 | Ht 68.0 in | Wt 175.2 lb

## 2022-02-28 DIAGNOSIS — E782 Mixed hyperlipidemia: Secondary | ICD-10-CM | POA: Insufficient documentation

## 2022-02-28 DIAGNOSIS — I639 Cerebral infarction, unspecified: Secondary | ICD-10-CM | POA: Insufficient documentation

## 2022-02-28 DIAGNOSIS — I4891 Unspecified atrial fibrillation: Secondary | ICD-10-CM | POA: Insufficient documentation

## 2022-02-28 DIAGNOSIS — I10 Essential (primary) hypertension: Secondary | ICD-10-CM | POA: Insufficient documentation

## 2022-02-28 DIAGNOSIS — E785 Hyperlipidemia, unspecified: Secondary | ICD-10-CM | POA: Insufficient documentation

## 2022-02-28 DIAGNOSIS — I700 Atherosclerosis of aorta: Secondary | ICD-10-CM | POA: Insufficient documentation

## 2022-02-28 DIAGNOSIS — I350 Nonrheumatic aortic (valve) stenosis: Secondary | ICD-10-CM | POA: Insufficient documentation

## 2022-02-28 NOTE — Assessment & Plan Note (Signed)
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  2D echo performed 08/23/2021 revealed his thoracic aorta measured 47 mm.

## 2022-02-28 NOTE — Assessment & Plan Note (Signed)
2D echo performed 08/23/2021 revealed normal LV systolic function with moderate aortic stenosis.  The valve area however measured 2.25 cm with a peak gradient of 40 mmHg.  We will continue to follow this on an annual basis.

## 2022-02-28 NOTE — Patient Instructions (Addendum)
Medication Instructions:  Your physician recommends that you continue on your current medications as directed. Please refer to the Current Medication list given to you today.  *If you need a refill on your cardiac medications before your next appointment, please call your pharmacy*    Testing/Procedures: Your physician has requested that you have an echocardiogram in May 2024. Echocardiography is a painless test that uses sound waves to create images of your heart. It provides your doctor with information about the size and shape of your heart and how well your heart's chambers and valves are working. This procedure takes approximately one hour. There are no restrictions for this procedure. Please do NOT wear cologne, perfume, aftershave, or lotions (deodorant is allowed). Please arrive 15 minutes prior to your appointment time. This test is done at [facility] ([address] [place], [place] [address])   Follow-Up: At [facility], you and your health needs are our priority.  As part of our continuing mission to provide you with exceptional heart care, we have created designated Provider Care Teams.  These Care Teams include your primary Cardiologist (physician) and Advanced Practice Providers (APPs -  Physician Assistants and Nurse Practitioners) who all work together to provide you with the care you need, when you need it.  We recommend signing up for the patient portal called "MyChart".  Sign up information is provided on this After Visit Summary.  MyChart is used to connect with patients for Virtual Visits (Telemedicine).  Patients are able to view lab/test results, encounter notes, upcoming appointments, etc.  Non-urgent messages can be sent to your provider as well.   To learn more about what you can do with MyChart, go to [email or web address].    Your next appointment:   6 month(s)  The format for your next appointment:   In Person  Provider:   Dr.  Allyson Sabal

## 2022-02-28 NOTE — Assessment & Plan Note (Signed)
History of essential hypertension a blood pressure measured today at 111/69.  He is on candesartan which he takes in the evening and titrates the dose based on his blood pressure.

## 2022-02-28 NOTE — Assessment & Plan Note (Signed)
History of stroke back 5/2 through 08/24/2021 at [facility].  The presumption was that this was hemorrhagic.  He was on Xarelto for chronic A-fib.

## 2022-02-28 NOTE — Progress Notes (Signed)
02/28/2022 [name]   1927/09/18  [id number]  Primary Physician Zachary Hatchet, NP Primary Cardiologist: Zachary Harp MD Zachary Underwood, [place]  HPI:  Zachary Underwood is a 88 y.o. delightful, moderately overweight married Caucasian male father of 4 children, grandfather of 5 grandchildren referred by Zachary Athens, NP to be established in my practice because of A-fib and stroke.  He is a PhD and an Chief Strategy Officer.  His wife, Zachary Underwood who accompanies him is also a PhD, lawyer and scientist.  They recently moved back from [name] in April.  They were there for 12 years prior.  The patient's father, Zachary Underwood is a renowned Materials engineer.  He has a history of treated hypertension and hyperlipidemia.  He is never had a heart attack.  He apparently had a stroke on 08/22/2021 and was hospitalized for 2 days.  It sounds like he had hemorrhagic stroke.  He does have chronic A-fib on Xarelto oral anticoagulation.  He had a thoracic aortic rupture/dissection in [name] 12 years ago and was sent surgically treated at that time.  He denies chest pain or shortness of breath.   Current Meds  Medication Sig   atorvastatin (LIPITOR) 10 MG tablet Take 1/2 tablet (5 mg total) by mouth daily.   candesartan (ATACAND) 4 MG tablet Take 4 mg by mouth daily. Take 1/2 tablet at bedtime   ibuprofen (ADVIL) 200 MG tablet Take 200 mg by mouth every 6 (six) hours as needed.   metolazone (ZAROXOLYN) 2.5 MG tablet Take 1 tablet (2.5 mg total) by mouth daily.   OVER THE COUNTER MEDICATION Take 8 mg by mouth See admin instructions. Candor 8 mg- Take 8 mg by mouth once a day   Rivaroxaban (XARELTO) 15 MG TABS tablet Take 15 mg by mouth 2 (two) times daily with a meal.   tamsulosin (FLOMAX) 0.4 MG CAPS capsule Take 0.4 mg by mouth at bedtime.     Allergies  Allergen Reactions   Beef-Derived Products     Patient does not eat beef    Other Other (See Comments) and Hypertension    Fusid- Legs became  very swollen   Tylenol [Acetaminophen] Hives    Social History   Socioeconomic History   Marital status: Married    Spouse name: Zachary Underwood   Number of children: 4   Years of education: Not on file   Highest education level: Professional school degree (e.g., MD, DDS, DVM, JD)  Occupational History   Occupation: Administrator, sports  Tobacco Use   Smoking status: Never   Smokeless tobacco: Never  Vaping Use   Vaping Use: Never used  Substance and Sexual Activity   Alcohol use: Never   Drug use: Never   Sexual activity: Not Currently  Other Topics Concern   Not on file  Social History Narrative   Lives with wife   Job Administrator, sports    Social Determinants of Health   Financial Resource Strain: Low Risk  (09/04/2021)   Overall Financial Resource Strain (CARDIA)    Difficulty of Paying Living Expenses: Not hard at all  Food Insecurity: No Food Insecurity (09/04/2021)   Hunger Vital Sign    Worried About Running Out of Food in the Last Year: Never true    Ran Out of Food in the Last Year: Never true  Transportation Needs: Not on file  Physical Activity: Not on file  Stress: No Stress Concern Present (09/04/2021)   [employer]  Stress Questionnaire    Feeling of Stress : Only a little  Social Connections: Unknown (09/11/2021)   Social Connection and Isolation Panel [NHANES]    Frequency of Communication with Friends and Family: Twice a week    Frequency of Social Gatherings with Friends and Family: Patient refused    Attends Religious Services: Patient refused    Database administrator or Organizations: Yes    Attends Banker Meetings: 1 to 4 times per year    Marital Status: Married  Catering manager Violence: Not At Risk (09/11/2021)   Humiliation, Afraid, Rape, and Kick questionnaire    Fear of Current or Ex-Partner: No    Emotionally Abused: No    Physically Abused: No    Sexually Abused: No     Review of  Systems: General: negative for chills, fever, night sweats or weight changes.  Cardiovascular: negative for chest pain, dyspnea on exertion, edema, orthopnea, palpitations, paroxysmal nocturnal dyspnea or shortness of breath Dermatological: negative for rash Respiratory: negative for cough or wheezing Urologic: negative for hematuria Abdominal: negative for nausea, vomiting, diarrhea, bright red blood per rectum, melena, or hematemesis Neurologic: negative for visual changes, syncope, or dizziness All other systems reviewed and are otherwise negative except as noted above.    Blood pressure 111/69, pulse 64, height 5\' 8"  (1.727 m), weight 175 lb 3.2 oz (79.5 kg), SpO2 99 %.  General appearance: alert and no distress Neck: no adenopathy, no carotid bruit, no JVD, supple, symmetrical, trachea midline, and thyroid not enlarged, symmetric, no tenderness/mass/nodules Lungs: clear to auscultation bilaterally Heart: irregularly irregular rhythm and 2/6 outflow tract murmur consistent with aortic stenosis. Extremities: extremities normal, atraumatic, no cyanosis or edema Pulses: 2+ and symmetric Skin: Skin color, texture, turgor normal. No rashes or lesions Neurologic: Grossly normal  EKG atrial fibrillation with a ventricular sponsor at 64 and right bundle branch block.  I personally reviewed this EKG.  ASSESSMENT AND PLAN:   Stroke [facility]) History of stroke back 5/2 through 08/24/2021 at [facility].  The presumption was that this was hemorrhagic.  He was on Xarelto for chronic A-fib.  Atrial fibrillation (HCC) History of persistent A-fib rate controlled on Xarelto oral anticoagulation.  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  2D echo performed 08/23/2021 revealed his thoracic aorta measured 47 mm.  Hyperlipidemia History of hyperlipidemia on statin therapy with lipid profile performed 08/23/2021  revealing total cholesterol 110, LDL 54 and HDL 36.  Essential hypertension History of essential hypertension a blood pressure measured today at 111/69.  He is on candesartan which he takes in the evening and titrates the dose based on his blood pressure.  Aortic stenosis, moderate 2D echo performed 08/23/2021 revealed normal LV systolic function with moderate aortic stenosis.  The valve area however measured 2.25 cm with a peak gradient of 40 mmHg.  We will continue to follow this on an annual basis.     10/23/2021 MD FACP,FACC,FAHA, [facility] 02/28/2022 11:59 AM

## 2022-02-28 NOTE — Assessment & Plan Note (Signed)
History of hyperlipidemia on statin therapy with lipid profile performed 08/23/2021 revealing total cholesterol 110, LDL 54 and HDL 36.

## 2022-02-28 NOTE — Assessment & Plan Note (Signed)
History of persistent A. fib rate controlled on Xarelto oral anticoagulation. 

## 2022-03-22 ENCOUNTER — Other Ambulatory Visit (HOSPITAL_BASED_OUTPATIENT_CLINIC_OR_DEPARTMENT_OTHER): Payer: Medicare Other

## 2022-03-27 ENCOUNTER — Encounter: Payer: Self-pay | Admitting: Neurology

## 2022-03-27 ENCOUNTER — Ambulatory Visit (INDEPENDENT_AMBULATORY_CARE_PROVIDER_SITE_OTHER): Payer: Medicare Other | Admitting: Neurology

## 2022-03-27 VITALS — BP 118/60 | HR 61 | Ht 68.0 in | Wt 182.0 lb

## 2022-03-27 DIAGNOSIS — R0683 Snoring: Secondary | ICD-10-CM | POA: Diagnosis not present

## 2022-03-27 DIAGNOSIS — G3184 Mild cognitive impairment, so stated: Secondary | ICD-10-CM

## 2022-03-27 DIAGNOSIS — I63412 Cerebral infarction due to embolism of left middle cerebral artery: Secondary | ICD-10-CM | POA: Diagnosis not present

## 2022-03-27 DIAGNOSIS — Z006 Encounter for examination for normal comparison and control in clinical research program: Secondary | ICD-10-CM | POA: Insufficient documentation

## 2022-03-27 NOTE — Progress Notes (Signed)
Chief Complaint  Patient presents with   Follow-up    Rm 12. Accompanied by wife. Pt c/o difficulty with attention and comprehension.      ASSESSMENT AND PLAN  [name] is a 88 y.o. male   Mild cognitive Impairment  MOCA 25/30,  MRI brain showed mild atrophy  Brain Amyloid PET scan  Lab to rule out treatable etiology.  Cerebral vascular event  Vascular risk factor of aging, Afib, hyperlipidemia  On Xarelto, could not tolerate Eliquis, whole body itching  At risk for obstructive sleep apnea.  Narrow oropharyngeal space,   Refer to sleep study  DIAGNOSTIC DATA (LABS, IMAGING, TESTING) - I reviewed patient records, labs, notes, testing and imaging myself where available.   MEDICAL HISTORY:  Zachary Underwood is a 88 year old male, accompanied by his wife, seen in request by primary care nurse practitioner [name] for evaluation of stroke, occasionally confusion spells, initial evaluation was on March 27, 2022   I reviewed and summarized the referring note.  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[employer], imaging still writing published article regularly  Late April 2023, he fell at the backyard, couple days later, he had an episode of feeling bad, word finding difficulties, right hand weakness, lasting for few minutes, recurrent on Aug 22, 2021, mild slurred speech, was treated at [place] reviewed MRI of the brain from Aug 22, 2021, multiple small foci of acute/early subacute ischemia within the anterior left parietal lobe, old left cerebellum infarction  CT angiogram showed no large vessel disease  Echocardiogram showed normal ejection fraction, no regional wall motion abnormality  LDL 54 A1c 5.4  He was changed to Eliquis for a while could not tolerate it,  developed whole body itching, went back to Xarelto, cardiac rhythm remained sinus on the monitoring during his hospital stay  However, since hospital admission, he fell again in May 2023 at home, he was working at the sink, no warning signs, fell on his left side, while wife attended him immediately, he was lucid, no evidence of seizure  He was also noted to have increased hearing loss, but wife also reported that he has trouble interpret auditory information sometimes, otherwise he is still proliferative, writing some new published articles recently  PHYSICAL EXAM:   Vitals:   03/27/22 1022  BP: 118/60  Pulse: 61  Weight: 182 lb (82.6 kg)  Height: 5\' 8"  (1.727 m)   Not recorded     Body mass index is 27.67 kg/m.  PHYSICAL EXAMNIATION:  Gen: NAD, conversant, well nourised, well groomed                     Cardiovascular: Regular rate rhythm, no peripheral edema, warm, nontender. Eyes: Conjunctivae clear without exudates or hemorrhage Neck: Supple, no carotid bruits. Pulmonary: Clear to auscultation bilaterally   NEUROLOGICAL EXAM:  MENTAL STATUS: Speech/cognition: Hard of hearing, oriented to history taking and casual conversation    03/27/2022   11:00 AM  Montreal Cognitive Assessment   Visuospatial/ Executive (0/5) 4  Naming (0/3) 3  Attention: Read list of digits (0/2) 2  Attention: Read list of letters (0/1) 1  Attention: Serial 7 subtraction starting at 100 (0/3) 3  Language: Repeat phrase (0/2) 2  Language : Fluency (0/1) 1  Abstraction (0/2) 2  Delayed Recall (0/5)  1  Orientation (0/6) 6  Total 25    CRANIAL NERVES: CN II: Visual fields are full to confrontation. Pupils are round equal and briskly reactive to light. CN III, IV, VI: extraocular movement are normal. No ptosis. CN V: Facial sensation is intact to light touch CN VII: Face is symmetric with normal eye closure  CN VIII: Hearing is normal to causal conversation. CN IX, X: Phonation is  normal. CN XI: Head turning and shoulder shrug are intact  MOTOR: There is no pronator drift of out-stretched arms. Muscle bulk and tone are normal. Muscle strength is normal.  REFLEXES: Reflexes are 1 and symmetric at the biceps, triceps, knees, and ankles. Plantar responses are flexor.  SENSORY: mild pitting edema, mild length dependent sensory loss  COORDINATION: There is no trunk or limb dysmetria noted.  GAIT/STANCE: need push up, cautious, mildly unsteady.  REVIEW OF SYSTEMS:  Full 14 system review of systems performed and notable only for as above All other review of systems were negative.   ALLERGIES: Allergies  Allergen Reactions   Beef-Derived Products     Patient does not eat beef    Other Other (See Comments) and Hypertension    Fusid- Legs became very swollen   Tylenol [Acetaminophen] Hives    HOME MEDICATIONS: Current Outpatient Medications  Medication Sig Dispense Refill   candesartan (ATACAND) 4 MG tablet Take 4 mg by mouth daily. Take 1/2 tablet at bedtime     Ferrous Sulfate (IRON PO) Take 25 mg by mouth daily.     ibuprofen (ADVIL) 200 MG tablet Take 200 mg by mouth every 6 (six) hours as needed.     metolazone (ZAROXOLYN) 2.5 MG tablet Take 1 tablet (2.5 mg total) by mouth daily. 30 tablet 0   Rivaroxaban (XARELTO) 15 MG TABS tablet Take 15 mg by mouth 2 (two) times daily with a meal.     tamsulosin (FLOMAX) 0.4 MG CAPS capsule Take 0.4 mg by mouth at bedtime.     UNABLE TO FIND Med Name: Stator 5 MG (rouvastatin, ramipril combination)     atorvastatin (LIPITOR) 10 MG tablet Take 1/2 tablet (5 mg total) by mouth daily. (Patient not taking: Reported on 03/27/2022) 30 tablet 0   OVER THE COUNTER MEDICATION Take 8 mg by mouth See admin instructions. Candor 8 mg- Take 8 mg by mouth once a day (Patient not taking: Reported on 03/27/2022)     OVER THE COUNTER MEDICATION Take 10 mg by mouth See admin instructions. Lercapress 10 mg- Take 10 mg by mouth once a day  (Patient not taking: Reported on 02/28/2022)     No current facility-administered medications for this visit.    PAST MEDICAL HISTORY: Past Medical History:  Diagnosis Date   Arthritis    Cardiac arrhythmia    Hypertension    Ruptured aortic aneurysm (HCC)    Stroke (HCC) 08/23/2021    PAST SURGICAL HISTORY: Past Surgical History:  Procedure Laterality Date   open heart surgery     ruptured aortic aneurysm repair      FAMILY HISTORY: History reviewed. No pertinent family history.  SOCIAL HISTORY: Social History   Socioeconomic History   Marital status: Married    Spouse name: Carollee Leitz   Number of children: 4   Years of education: Not on file   Highest education level: Professional school degree (e.g., MD, DDS, DVM, JD)  Occupational History   Occupation: economist  Tobacco Use   Smoking status: Never   Smokeless tobacco:  Never  Vaping Use   Vaping Use: Never used  Substance and Sexual Activity   Alcohol use: Never   Drug use: Never   Sexual activity: Not Currently  Other Topics Concern   Not on file  Social History Narrative   Lives with wife   Job Administrator, sports    Social Determinants of Health   Financial Resource Strain: Low Risk  (09/04/2021)   Overall Financial Resource Strain (CARDIA)    Difficulty of Paying Living Expenses: Not hard at all  Food Insecurity: No Food Insecurity (09/04/2021)   Hunger Vital Sign    Worried About Running Out of Food in the Last Year: Never true    Ran Out of Food in the Last Year: Never true  Transportation Needs: Not on file  Physical Activity: Not on file  Stress: No Stress Concern Present (09/04/2021)   [place]    Feeling of Stress : Only a little  Social Connections: Unknown (09/11/2021)   Social Connection and Isolation Panel [NHANES]    Frequency of Communication with Friends and Family: Twice a week    Frequency of Social Gatherings with  Friends and Family: Patient refused    Attends Religious Services: Patient refused    Marine scientist or Organizations: Yes    Attends Archivist Meetings: 1 to 4 times per year    Marital Status: Married  Human resources officer Violence: Not At Risk (09/11/2021)   Humiliation, Afraid, Rape, and Kick questionnaire    Fear of Current or Ex-Partner: No    Emotionally Abused: No    Physically Abused: No    Sexually Abused: No   Total time spent reviewing the chart, obtaining history, examined patient, ordering tests, documentation, consultations and family, care coordination was 46 minutes     Marcial Pacas, M.D. Ph.D.  [facility] [address], [place], [place] [address] Ph: [phone] Fax: [phone]  CC:  [name], NP [place],  [place] [address]  [name], NP

## 2022-03-28 LAB — FOLATE: Folate: 10.2 ng/mL (ref >3.0)

## 2022-03-28 LAB — VITAMIN B12: Vitamin B-12: 473 pg/mL (ref 232–1245)

## 2022-03-28 LAB — C-REACTIVE PROTEIN: CRP: 3 mg/L (ref 0–10)

## 2022-03-28 LAB — SEDIMENTATION RATE: Sed Rate: 9 mm/hr (ref 0–30)

## 2022-04-09 ENCOUNTER — Telehealth: Payer: Self-pay | Admitting: Neurology

## 2022-04-09 NOTE — Telephone Encounter (Signed)
Pt said had blood drawn on 03/27/22. Was hard for tech to get blood, she finally got blood. Have bruising on arm, can hardly move arm. Would like a call from the nurse.

## 2022-04-09 NOTE — Telephone Encounter (Signed)
I sent mychart message to the pt on this.  

## 2022-04-10 ENCOUNTER — Ambulatory Visit: Payer: Medicare Other | Admitting: Neurology

## 2022-04-17 ENCOUNTER — Emergency Department (HOSPITAL_BASED_OUTPATIENT_CLINIC_OR_DEPARTMENT_OTHER)
Admission: EM | Admit: 2022-04-17 | Discharge: 2022-04-17 | Discharge status: Against Medical Advice | Payer: Medicare Other | Attending: Emergency Medicine | Admitting: Emergency Medicine

## 2022-04-17 ENCOUNTER — Ambulatory Visit (INDEPENDENT_AMBULATORY_CARE_PROVIDER_SITE_OTHER): Payer: Medicare Other

## 2022-04-17 ENCOUNTER — Other Ambulatory Visit: Payer: Self-pay

## 2022-04-17 ENCOUNTER — Encounter (HOSPITAL_BASED_OUTPATIENT_CLINIC_OR_DEPARTMENT_OTHER): Payer: Self-pay

## 2022-04-17 DIAGNOSIS — Z5321 Procedure and treatment not carried out due to patient leaving prior to being seen by health care provider: Secondary | ICD-10-CM | POA: Insufficient documentation

## 2022-04-17 DIAGNOSIS — L7632 Postprocedural hematoma of skin and subcutaneous tissue following other procedure: Secondary | ICD-10-CM | POA: Diagnosis present

## 2022-04-17 DIAGNOSIS — I4891 Unspecified atrial fibrillation: Secondary | ICD-10-CM

## 2022-04-17 DIAGNOSIS — I639 Cerebral infarction, unspecified: Secondary | ICD-10-CM | POA: Diagnosis not present

## 2022-04-17 LAB — ECHOCARDIOGRAM COMPLETE
AR max vel: 0.96 cm2
AV Area VTI: 1.14 cm2
AV Area mean vel: 1.29 cm2
AV Mean grad: 24 mmHg
AV Peak grad: 54.1 mmHg
AV Vena cont: 0.43 cm
Ao pk vel: 3.68 m/s
Area-P 1/2: 3.28 cm2
MV VTI: 2.02 cm2
P 1/2 time: 360 msec
S' Lateral: 2.14 cm

## 2022-04-17 NOTE — ED Notes (Signed)
Patient informed RN that they would like to leave without being seen after triage. RN informed Patient to return if they would like to at any time. RN attempted to draw Blood Specimen Samples for Analysis prior to the patient leaving without Success.

## 2022-04-17 NOTE — ED Triage Notes (Signed)
Patient here POV from Home.  Endorses Right Arm Hematoma. Large in Size and is resultant from a Poor Blood Draw that occurred 3 Weeks ago. Bruising and Swelling is Large and Extends from Medial Left Wrist to Shoulder.   NAD Noted during Triage. A&Ox4. GCS 15. BIB Wheelchair.

## 2022-04-18 ENCOUNTER — Telehealth: Payer: Self-pay | Admitting: Neurology

## 2022-04-18 NOTE — Telephone Encounter (Signed)
I was able to talk with his wife, without the opportunity to talk with patient directly, she stated that his hearing is not good, not convenient for phone conversation.  She is very frustrated that patient has developed arm bruise since last lab draw at our office on Dec 5th, went to ER on Dec 26th 2023,per record for right arm hematoma, recorded also revealed that they left ER without being seen.  RN attempted to draw blood sample without success.  I have offered to see him at office today on Apr 18 2022.   She only agree to relay the message to her husband, and call us back for decision.  I called twice at [phone] phone number listed under patient's name, without success.

## 2022-04-24 ENCOUNTER — Other Ambulatory Visit: Payer: Self-pay

## 2022-04-24 ENCOUNTER — Emergency Department (HOSPITAL_BASED_OUTPATIENT_CLINIC_OR_DEPARTMENT_OTHER): Payer: Medicare Other

## 2022-04-24 ENCOUNTER — Emergency Department (HOSPITAL_BASED_OUTPATIENT_CLINIC_OR_DEPARTMENT_OTHER)
Admission: EM | Admit: 2022-04-24 | Discharge: 2022-04-25 | Disposition: A | Discharge status: Home / Self Care | Payer: Medicare Other | Attending: Emergency Medicine | Admitting: Emergency Medicine

## 2022-04-24 ENCOUNTER — Encounter (HOSPITAL_BASED_OUTPATIENT_CLINIC_OR_DEPARTMENT_OTHER): Payer: Self-pay

## 2022-04-24 DIAGNOSIS — R791 Abnormal coagulation profile: Secondary | ICD-10-CM | POA: Diagnosis not present

## 2022-04-24 DIAGNOSIS — M79602 Pain in left arm: Secondary | ICD-10-CM | POA: Diagnosis not present

## 2022-04-24 DIAGNOSIS — I4891 Unspecified atrial fibrillation: Secondary | ICD-10-CM | POA: Diagnosis not present

## 2022-04-24 DIAGNOSIS — I10 Essential (primary) hypertension: Secondary | ICD-10-CM | POA: Diagnosis not present

## 2022-04-24 LAB — CBC WITH DIFFERENTIAL/PLATELET
Abs Immature Granulocytes: 0.05 10*3/uL (ref 0.00–0.07)
Basophils Absolute: 0.1 10*3/uL (ref 0.0–0.1)
Basophils Relative: 1 %
Eosinophils Absolute: 0.2 10*3/uL (ref 0.0–0.5)
Eosinophils Relative: 2 %
HCT: 35 % — ABNORMAL LOW (ref 39.0–52.0)
Hemoglobin: 11.4 g/dL — ABNORMAL LOW (ref 13.0–17.0)
Immature Granulocytes: 1 %
Lymphocytes Relative: 13 %
Lymphs Abs: 1 10*3/uL (ref 0.7–4.0)
MCH: 30.7 pg (ref 26.0–34.0)
MCHC: 32.6 g/dL (ref 30.0–36.0)
MCV: 94.3 fL (ref 80.0–100.0)
Monocytes Absolute: 0.7 10*3/uL (ref 0.1–1.0)
Monocytes Relative: 9 %
Neutro Abs: 5.5 10*3/uL (ref 1.7–7.7)
Neutrophils Relative %: 74 %
Platelets: 237 10*3/uL (ref 150–400)
RBC: 3.71 MIL/uL — ABNORMAL LOW (ref 4.22–5.81)
RDW: 13.2 % (ref 11.5–15.5)
WBC: 7.5 10*3/uL (ref 4.0–10.5)
nRBC: 0 % (ref 0.0–0.2)

## 2022-04-24 LAB — PROTIME-INR
INR: 1.9 — ABNORMAL HIGH (ref 0.8–1.2)
Prothrombin Time: 21.6 seconds — ABNORMAL HIGH (ref 11.4–15.2)

## 2022-04-24 LAB — BASIC METABOLIC PANEL
Anion gap: 9 (ref 5–15)
BUN: 36 mg/dL — ABNORMAL HIGH (ref 8–23)
CO2: 27 mmol/L (ref 22–32)
Calcium: 9.6 mg/dL (ref 8.9–10.3)
Chloride: 100 mmol/L (ref 98–111)
Creatinine, Ser: 1.09 mg/dL (ref 0.61–1.24)
GFR, Estimated: >60 mL/min (ref >60)
Glucose, Bld: 93 mg/dL (ref 70–99)
Potassium: 3.3 mmol/L — ABNORMAL LOW (ref 3.5–5.1)
Sodium: 136 mmol/L (ref 135–145)

## 2022-04-24 LAB — LACTIC ACID, PLASMA
Lactic Acid, Venous: 1.1 mmol/L (ref 0.5–1.9)
Lactic Acid, Venous: 0.9 mmol/L (ref 0.5–1.9)

## 2022-04-24 LAB — APTT: aPTT: 57 seconds — ABNORMAL HIGH (ref 24–36)

## 2022-04-24 MED ORDER — TRAMADOL HCL 50 MG PO TABS
50.0000 mg | ORAL_TABLET | Freq: Once | ORAL | Status: AC
  Administered 2022-04-24: 50 mg via ORAL
  Filled 2022-04-24: qty 1

## 2022-04-24 MED ORDER — IOHEXOL 350 MG/ML SOLN
100.0000 mL | Freq: Once | INTRAVENOUS | Status: AC | PRN
  Administered 2022-04-24: 100 mL via INTRAVENOUS

## 2022-04-24 MED ORDER — FENTANYL CITRATE PF 50 MCG/ML IJ SOSY
12.5000 ug | PREFILLED_SYRINGE | Freq: Once | INTRAMUSCULAR | Status: AC
  Administered 2022-04-24: 12.5 ug via INTRAVENOUS
  Filled 2022-04-24: qty 1

## 2022-04-24 NOTE — ED Notes (Signed)
ED Provider at bedside. 

## 2022-04-24 NOTE — ED Provider Notes (Signed)
[facility] EMERGENCY DEPARTMENT Provider Note   CSN: [id number] Arrival date & time: 04/24/22  1735     History  Chief Complaint  Patient presents with   Arm Pain    Zachary Underwood is a 89 y.o. male.  With past medical history of atrial fibrillation, stroke, hypertension, hyperlipidemia who presents to the emergency department with arm pain.  Presents with wife who provides the majority of the history. She states that about 1 month ago the patient had a blood draw that resulted in bruising of his left upper arm. She states that since then he has had swelling of the left arm and hand. He has had ecchymosis to the axilla. She states today she noticed his left hand being cooler than the right and he began having ecchymosis initially to the ulnar side of the left hand which progressed to the entire hand. She states that he is complaining of pain to the left arm. She went to PCP who reportedly could not feel a radial pulse, so sent them to the ER.    Arm Pain       Home Medications Prior to Admission medications   Medication Sig Start Date End Date Taking? Authorizing Provider  atorvastatin (LIPITOR) 10 MG tablet Take 1/2 tablet (5 mg total) by mouth daily. 08/25/21  Yes [name], MD  candesartan (ATACAND) 4 MG tablet Take 4 mg by mouth daily. Take 1/2 tablet at bedtime   Yes [provider]  Ferrous Sulfate (IRON PO) Take 25 mg by mouth daily.   Yes [provider]  metolazone (ZAROXOLYN) 2.5 MG tablet Take 1 tablet (2.5 mg total) by mouth daily. 08/24/21  Yes [name], MD  Rivaroxaban (XARELTO) 15 MG TABS tablet Take 15 mg by mouth 2 (two) times daily with a meal.   Yes [provider]  traMADol (ULTRAM) 50 MG tablet Take 50 mg by mouth 2 (two) times daily as needed for moderate pain. 04/21/22  Yes [provider]  ibuprofen (ADVIL) 200 MG tablet Take 200 mg by mouth every 6 (six) hours as needed.    [provider]   OVER THE COUNTER MEDICATION Take 8 mg by mouth See admin instructions. Candor 8 mg- Take 8 mg by mouth once a day Patient not taking: Reported on 03/27/2022    [provider]  OVER THE COUNTER MEDICATION Take 10 mg by mouth See admin instructions. Lercapress 10 mg- Take 10 mg by mouth once a day Patient not taking: Reported on 02/28/2022    [provider]  tamsulosin (FLOMAX) 0.4 MG CAPS capsule Take 0.4 mg by mouth at bedtime.    [provider]  UNABLE TO FIND Med Name: Stator 5 MG (rouvastatin, ramipril combination)    [provider]      Allergies    Beef-derived products, Eliquis [apixaban], Lasix [furosemide], Other, and Tylenol [acetaminophen]    Review of Systems   Review of Systems  Skin:  Positive for color change.  Neurological:  Negative for weakness and numbness.  All other systems reviewed and are negative.   Physical Exam Updated Vital Signs BP 122/75   Pulse 70   Temp 98.2 F (36.8 C) (Oral)   Resp 18   Ht 5\' 8"  (1.727 m)   Wt 82.6 kg   SpO2 93%   BMI 27.67 kg/m  Physical Exam Vitals and nursing note reviewed.  Constitutional:      Appearance: Normal appearance. He is ill-appearing.  HENT:  Head: Normocephalic.  Eyes:     General: No scleral icterus. Cardiovascular:     Rate and Rhythm: Normal rate and regular rhythm.  Pulmonary:     Effort: Pulmonary effort is normal.     Breath sounds: Normal breath sounds.  Abdominal:     General: Bowel sounds are normal.     Palpations: Abdomen is soft.  Musculoskeletal:        General: Swelling and tenderness present.     Comments: Left arm with pitting edema Delayed cap refill  Extremity is cool to touch  Arm is tender to movement  Palpable radial pulse. Cannot palpate ulnar pulse   Skin:    General: Skin is warm and dry.     Capillary Refill: Capillary refill takes more than 3 seconds.     Findings: Bruising present.  Neurological:     General: No focal deficit  present.     Mental Status: He is alert and oriented to person, place, and time.     Motor: No weakness.  Psychiatric:        Mood and Affect: Mood normal.        Behavior: Behavior normal.     ED Results / Procedures / Treatments   Labs (all labs ordered are listed, but only abnormal results are displayed) Labs Reviewed  BASIC METABOLIC PANEL  CBC WITH DIFFERENTIAL/PLATELET  LACTIC ACID, PLASMA  LACTIC ACID, PLASMA   EKG None  Radiology No results found.  Procedures Procedures   Medications Ordered in ED Medications - No data to display  ED Course/ Medical Decision Making/ A&P                           Medical Decision Making Amount and/or Complexity of Data Reviewed Labs: ordered. Radiology: ordered.  Initial Impression and Ddx 89 year old male who presents to the emergency department with right arm swelling, bruising and coolness.  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  A CT angio of the left upper arm as well as a DVT study has been ordered.  Will order labs with lactic. Patient PMH that increases complexity of ED encounter: Stroke, atrial fibrillation, hypertension  Interpretation of Diagnostics I independent reviewed and interpreted the labs as followed: ***  - I independently visualized the following imaging with scope of interpretation limited to determining acute life threatening conditions related to emergency care: ***, which revealed ***  Patient Reassessment and Ultimate Disposition/Management ***  Patient management required discussion with the following services or consulting groups:  {BEROCONSULT:26841}  Complexity of Problems Addressed {BEROCOPA:26833}  Additional Data Reviewed and Analyzed Further history obtained from: {BERODATA:26834}  Patient Encounter Risk Assessment {BERORISK:26838}  Final Clinical Impression(s) / ED Diagnoses Final diagnoses:  None    Rx / DC Orders ED Discharge Orders      None

## 2022-04-24 NOTE — Discharge Instructions (Signed)
You were seen in the emergency department today for swelling, bruising and pain to the left arm and shoulder.  We did a CT angiography as well as the ultrasound deep vein thrombosis study which both had no acute findings.  It is likely that you had a vascular injury during a blood draw that caused leaking of blood into your arm.  We spoke with the vascular surgeon who is reassured by the imaging findings.  He request that we Ace bandage your arm for compression and that you elevate your arm on multiple pillows in order to promote reabsorption of fluid.  This may take weeks to resolve. Please return to the emergency department if you begin to have paleness or coolness again to the arm.  I also suggest having follow-up with your primary care provider in 1 week to have a recheck of your symptoms.

## 2022-04-24 NOTE — ED Triage Notes (Addendum)
Pt brought in by his wife who reports pain and swelling to his left hand which she reports resulted from a blood draw a month ago. There is circumferential bruising and swelling to left hand and wrist. Hand appears cool, decreased cap refill to fingers. This RN uncertain if able to feel his radial pulse. He was sent by his PCP who reports he was not able to feel a radial pulse.

## 2022-04-24 NOTE — ED Notes (Signed)
Patient transported to CT 

## 2022-06-03 DIAGNOSIS — M7542 Impingement syndrome of left shoulder: Secondary | ICD-10-CM | POA: Insufficient documentation

## 2022-07-30 ENCOUNTER — Ambulatory Visit (INDEPENDENT_AMBULATORY_CARE_PROVIDER_SITE_OTHER): Payer: Medicare Other | Admitting: Podiatry

## 2022-07-30 ENCOUNTER — Encounter: Payer: Self-pay | Admitting: Podiatry

## 2022-07-30 DIAGNOSIS — H903 Sensorineural hearing loss, bilateral: Secondary | ICD-10-CM | POA: Insufficient documentation

## 2022-07-30 DIAGNOSIS — D689 Coagulation defect, unspecified: Secondary | ICD-10-CM

## 2022-07-30 DIAGNOSIS — R2689 Other abnormalities of gait and mobility: Secondary | ICD-10-CM | POA: Insufficient documentation

## 2022-07-30 DIAGNOSIS — B351 Tinea unguium: Secondary | ICD-10-CM

## 2022-07-30 DIAGNOSIS — H9193 Unspecified hearing loss, bilateral: Secondary | ICD-10-CM | POA: Insufficient documentation

## 2022-07-30 DIAGNOSIS — N400 Enlarged prostate without lower urinary tract symptoms: Secondary | ICD-10-CM | POA: Insufficient documentation

## 2022-07-30 DIAGNOSIS — Z658 Other specified problems related to psychosocial circumstances: Secondary | ICD-10-CM | POA: Insufficient documentation

## 2022-07-30 DIAGNOSIS — H9313 Tinnitus, bilateral: Secondary | ICD-10-CM | POA: Insufficient documentation

## 2022-07-30 DIAGNOSIS — M79675 Pain in left toe(s): Secondary | ICD-10-CM

## 2022-07-30 DIAGNOSIS — M79676 Pain in unspecified toe(s): Secondary | ICD-10-CM

## 2022-07-30 NOTE — Progress Notes (Signed)
Subjective:  Patient ID: Zachary Underwood, male    DOB: May 24, 1927,  MRN: [id number] HPI Chief Complaint  Patient presents with   Debridement    Requesting trim toenail - unable to cut himself-referred by PCP   New Patient (Initial Visit)    89 y.o. male presents with the above complaint.   ROS: Denies fever chills nausea vomit muscle aches pains calf pain back pain chest pain shortness of breath.  Past Medical History:  Diagnosis Date   Arthritis    Cardiac arrhythmia    Hypertension    Ruptured aortic aneurysm    Stroke 08/23/2021   Past Surgical History:  Procedure Laterality Date   open heart surgery     ruptured aortic aneurysm repair      Current Outpatient Medications:    celecoxib (CELEBREX) 200 MG capsule, Take 200 mg by mouth 2 (two) times daily., Disp: , Rfl:    atorvastatin (LIPITOR) 10 MG tablet, Take 1/2 tablet (5 mg total) by mouth daily., Disp: 30 tablet, Rfl: 0   candesartan (ATACAND) 4 MG tablet, Take 4 mg by mouth daily. Take 1/2 tablet at bedtime, Disp: , Rfl:    ibuprofen (ADVIL) 200 MG tablet, Take 200 mg by mouth every 6 (six) hours as needed., Disp: , Rfl:    metolazone (ZAROXOLYN) 2.5 MG tablet, Take 1 tablet (2.5 mg total) by mouth daily., Disp: 30 tablet, Rfl: 0   OVER THE COUNTER MEDICATION, Take 8 mg by mouth See admin instructions. Candor 8 mg- Take 8 mg by mouth once a day (Patient not taking: Reported on 03/27/2022), Disp: , Rfl:    OVER THE COUNTER MEDICATION, Take 10 mg by mouth See admin instructions. Lercapress 10 mg- Take 10 mg by mouth once a day (Patient not taking: Reported on 02/28/2022), Disp: , Rfl:    Rivaroxaban (XARELTO) 15 MG TABS tablet, Take 15 mg by mouth 2 (two) times daily with a meal., Disp: , Rfl:    tamsulosin (FLOMAX) 0.4 MG CAPS capsule, Take 0.4 mg by mouth at bedtime., Disp: , Rfl:    traMADol (ULTRAM) 50 MG tablet, Take 50 mg by mouth 2 (two) times daily as needed for moderate pain., Disp: , Rfl:    UNABLE TO FIND, Med  Name: Stator 5 MG (rouvastatin, ramipril combination), Disp: , Rfl:   Allergies  Allergen Reactions   Beef-Derived Products     Patient does not eat beef    Eliquis [Apixaban]    Lasix [Furosemide]    Other Other (See Comments) and Hypertension    Fusid- Legs became very swollen   Tylenol [Acetaminophen] Hives   Review of Systems Objective:  There were no vitals filed for this visit.  General: Well developed, nourished, in no acute distress, alert and oriented x3   Dermatological: Skin is warm, dry and supple bilateral. Nails x 10 are thick yellow dystrophic clinically mycotic painful palpation as well as debridement.  Remaining integument appears unremarkable at this time. There are no open sores, no preulcerative lesions, no rash or signs of infection present.  Vascular: Dorsalis Pedis artery and Posterior Tibial artery pedal pulses are 2/4 bilateral with immedate capillary fill time. Pedal hair growth present. No varicosities and no lower extremity edema present bilateral.   Neruologic: Grossly intact via light touch bilateral. Vibratory intact via tuning fork bilateral. Protective threshold with Semmes Wienstein monofilament intact to all pedal sites bilateral. Patellar and Achilles deep tendon reflexes 2+ bilateral. No Babinski or clonus noted bilateral.  Musculoskeletal: No gross boney pedal deformities bilateral. No pain, crepitus, or limitation noted with foot and ankle range of motion bilateral. Muscular strength 5/5 in all groups tested bilateral.  Gait: Unassisted, Nonantalgic.    Radiographs:  None taken  Assessment & Plan:   Assessment: Pain in limb secondary to nail dystrophy and onychomycosis.  Plan: Debridement of toenails today 1 through 5 bilaterally he will follow-up with Dr. Renae Fickle T. [place], [place]

## 2022-08-21 ENCOUNTER — Telehealth: Payer: Self-pay | Admitting: Neurology

## 2022-08-21 NOTE — Telephone Encounter (Signed)
Unable to LVM, sent mychart msg informing pt of need to reschedule 09/11/22 appt - MD out

## 2022-09-04 ENCOUNTER — Ambulatory Visit: Payer: Medicare Other | Admitting: Cardiovascular Disease

## 2022-09-11 ENCOUNTER — Ambulatory Visit: Payer: Medicare Other | Admitting: Neurology

## 2022-10-23 ENCOUNTER — Ambulatory Visit (INDEPENDENT_AMBULATORY_CARE_PROVIDER_SITE_OTHER): Payer: Medicare Other | Admitting: Podiatry

## 2022-10-23 DIAGNOSIS — M79675 Pain in left toe(s): Secondary | ICD-10-CM | POA: Diagnosis not present

## 2022-10-23 DIAGNOSIS — M79676 Pain in unspecified toe(s): Secondary | ICD-10-CM | POA: Diagnosis not present

## 2022-10-23 DIAGNOSIS — B351 Tinea unguium: Secondary | ICD-10-CM | POA: Diagnosis not present

## 2022-10-23 DIAGNOSIS — D689 Coagulation defect, unspecified: Secondary | ICD-10-CM | POA: Diagnosis not present

## 2022-10-23 NOTE — Progress Notes (Signed)
Presents today chief complaint of painful elongated toenails.  Objective: Toenails are long thick yellow dystrophic onychomycotic painful palpation as well as debridement.  Assessment: Pain in limb secondary to onychomycosis.  Plan: Debridement of toenails 1 through 5 bilateral

## 2023-01-28 ENCOUNTER — Other Ambulatory Visit: Payer: Self-pay | Admitting: Cardiovascular Disease

## 2023-01-28 ENCOUNTER — Ambulatory Visit: Payer: Medicare Other | Attending: Cardiovascular Disease | Admitting: Cardiovascular Disease

## 2023-01-28 ENCOUNTER — Encounter: Payer: Self-pay | Admitting: *Deleted

## 2023-01-28 ENCOUNTER — Encounter: Payer: Self-pay | Admitting: Cardiovascular Disease

## 2023-01-28 VITALS — BP 122/68 | HR 59 | Ht 68.0 in | Wt 187.6 lb

## 2023-01-28 DIAGNOSIS — I10 Essential (primary) hypertension: Secondary | ICD-10-CM

## 2023-01-28 DIAGNOSIS — I350 Nonrheumatic aortic (valve) stenosis: Secondary | ICD-10-CM

## 2023-01-28 DIAGNOSIS — I452 Bifascicular block: Secondary | ICD-10-CM

## 2023-01-28 DIAGNOSIS — R55 Syncope and collapse: Secondary | ICD-10-CM | POA: Diagnosis not present

## 2023-01-28 DIAGNOSIS — E782 Mixed hyperlipidemia: Secondary | ICD-10-CM | POA: Diagnosis not present

## 2023-01-28 DIAGNOSIS — I4891 Unspecified atrial fibrillation: Secondary | ICD-10-CM | POA: Diagnosis not present

## 2023-01-28 NOTE — Progress Notes (Signed)
01/28/2023 Zachary Underwood   03-06-1928  [id number]  Primary Physician Estevan Oaks, NP Primary Cardiologist: Runell Gess MD [facility], [place]  HPI:  Zachary Underwood is a 89 y.o.   delightful, moderately overweight married Caucasian male father of 4 children, grandfather of 5 grandchildren referred by Debria Garret, NP to be established in my practice because of A-fib and stroke.  He is a PhD and an Chartered loss adjuster.  His wife, Consuelo Pandy who accompanies him is also a PhD, lawyer and scientist.  They recently moved back from [place] in April 2023.  They were there for 12 years prior.  The patient's father, Azim Gillingham is a renowned Scientist, water quality.  I last saw him in the office 02/28/2022.  He has a history of treated hypertension and hyperlipidemia.  He is never had a heart attack.  He apparently had a stroke on 08/22/2021 and was hospitalized for 2 days.  It sounds like he had hemorrhagic stroke.  He does have chronic A-fib on Xarelto oral anticoagulation.  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  He denies chest pain or shortness of breath.   Since I saw him a year ago he has had 5 episodes of what sounds like syncope.  He is fairly unsteady on his feet as well walks with a cane.  He otherwise denies chest pain or shortness of breath.   Current Meds  Medication Sig   atorvastatin (LIPITOR) 10 MG tablet Take 1/2 tablet (5 mg total) by mouth daily.   candesartan (ATACAND) 4 MG tablet Take 4 mg by mouth daily. Take 1/2 tablet at bedtime   ibuprofen (ADVIL) 200 MG tablet Take 200 mg by mouth every 6 (six) hours as needed.   metolazone (ZAROXOLYN) 2.5 MG tablet Take 1 tablet (2.5 mg total) by mouth daily.   Rivaroxaban (XARELTO) 15 MG TABS tablet Take 15 mg by mouth daily with supper.   tamsulosin (FLOMAX) 0.4 MG CAPS capsule Take 0.4 mg by mouth at bedtime.   UNABLE TO FIND Med Name: Stator 5 MG  (rouvastatin, ramipril combination)     Allergies  Allergen Reactions   Beef-Derived Products     Patient does not eat beef    Eliquis [Apixaban]    Lasix [Furosemide]    Other Other (See Comments) and Hypertension    Fusid- Legs became very swollen   Tylenol [Acetaminophen] Hives    Social History   Socioeconomic History   Marital status: Married    Spouse name: Carollee Leitz   Number of children: 4   Years of education: Not on file   Highest education level: Professional school degree (e.g., MD, DDS, DVM, JD)  Occupational History   Occupation: Publishing copy  Tobacco Use   Smoking status: Never   Smokeless tobacco: Never  Vaping Use   Vaping status: Never Used  Substance and Sexual Activity   Alcohol use: Never   Drug use: Never   Sexual activity: Not Currently  Other Topics Concern   Not on file  Social History Narrative   Lives with wife   Job Publishing copy    Social Determinants of Health   Financial Resource Strain: Low Risk  (09/04/2021)   Overall Financial Resource Strain (CARDIA)    Difficulty of Paying Living Expenses: Not hard at all  Food Insecurity: No Food Insecurity (09/04/2021)   Hunger Vital Sign    Worried About Running Out  of Food in the Last Year: Never true    Ran Out of Food in the Last Year: Never true  Transportation Needs: Not on file  Physical Activity: Not on file  Stress: No Stress Concern Present (09/04/2021)   [employer] of Occupational Health - Occupational Stress Questionnaire    Feeling of Stress : Only a little  Social Connections: Unknown (09/11/2021)   Social Connection and Isolation Panel [NHANES]    Frequency of Communication with Friends and Family: Twice a week    Frequency of Social Gatherings with Friends and Family: Patient declined    Attends Religious Services: Patient declined    Database administrator or Organizations: Yes    Attends Banker Meetings: 1 to 4 times per year    Marital Status: Married   Catering manager Violence: Not At Risk (09/11/2021)   Humiliation, Afraid, Rape, and Kick questionnaire    Fear of Current or Ex-Partner: No    Emotionally Abused: No    Physically Abused: No    Sexually Abused: No     Review of Systems: General: negative for chills, fever, night sweats or weight changes.  Cardiovascular: negative for chest pain, dyspnea on exertion, edema, orthopnea, palpitations, paroxysmal nocturnal dyspnea or shortness of breath Dermatological: negative for rash Respiratory: negative for cough or wheezing Urologic: negative for hematuria Abdominal: negative for nausea, vomiting, diarrhea, bright red blood per rectum, melena, or hematemesis Neurologic: negative for visual changes, syncope, or dizziness All other systems reviewed and are otherwise negative except as noted above.    Blood pressure 122/68, pulse (!) 59, height 5\' 8"  (1.727 m), weight 187 lb 9.6 oz (85.1 kg), SpO2 98%.  General appearance: alert and no distress Neck: no adenopathy, no JVD, supple, symmetrical, trachea midline, thyroid not enlarged, symmetric, no tenderness/mass/nodules, and soft bilateral carotid bruits versus transmitted murmur Lungs: clear to auscultation bilaterally Heart: irregularly irregular rhythm Extremities: extremities normal, atraumatic, no cyanosis or edema Pulses: 2+ and symmetric Skin: Skin color, texture, turgor normal. No rashes or lesions Neurologic: Grossly normal  EKG EKG Interpretation Date/Time:  Monday January 28 2023 14:46:50 EDT Ventricular Rate:  59 PR Interval:    QRS Duration:  150 QT Interval:  428 QTC Calculation: 423 R Axis:   -38  Text Interpretation: Atrial fibrillation with slow ventricular response Left axis deviation Right bundle branch block Septal infarct , age undetermined Inferior infarct , age undetermined When compared with ECG of 24-Apr-2022 19:32, PREVIOUS ECG IS PRESENT Confirmed by Nanetta Batty [phone]) on 01/28/2023 3:08:02 PM     ASSESSMENT AND PLAN:   Atrial fibrillation (HCC) History of persistent A-fib rate controlled on Xarelto oral anticoagulation.  Hyperlipidemia History of hyperlipidemia on atorvastatin with lipid profile performed 08/23/2021 revealing total cholesterol 110, LDL 54 and HDL of 36.  Essential hypertension History of essential hypertension blood pressure measured today at 122/68.  He is on Atacand.  Aortic stenosis, moderate History of moderate aortic stenosis with 2D echo performed 04/17/2022 revealing normal LV systolic function with an aortic valve area of 1.14 cm with a mean gradient of 24 mmHg.  This has remained stable.  His ascending thoracic aorta measures 44 mm.  This will be repeated on an annual basis.  Syncope and collapse Mr. Castille has had 5 episodes of syncope since I last saw him.  His episodes occur suddenly without warning.  It is unclear the etiology.  He does have moderate aortic stenosis but no other symptoms associated with this.  It  certainly possible this is arrhythmogenically mediated.  I am going to place a 2-week Zio patch.  If this is unrevealing he may be a candidate for a loop recorder implantation.     Runell Gess MD FACP,FACC,FAHA, [facility] 01/28/2023 3:28 PM

## 2023-01-28 NOTE — Assessment & Plan Note (Signed)
History of hyperlipidemia on atorvastatin with lipid profile performed 08/23/2021 revealing total cholesterol 110, LDL 54 and HDL of 36.

## 2023-01-28 NOTE — Progress Notes (Unsigned)
Patient enrolled for [employer]/ [employer] to ship a 30 day cardiac event monitor to his address on file.

## 2023-01-28 NOTE — Assessment & Plan Note (Signed)
History of persistent A. fib rate controlled on Xarelto oral anticoagulation. 

## 2023-01-28 NOTE — Assessment & Plan Note (Signed)
History of essential hypertension blood pressure measured today at 122/68.  He is on Atacand.

## 2023-01-28 NOTE — Assessment & Plan Note (Signed)
History of moderate aortic stenosis with 2D echo performed 04/17/2022 revealing normal LV systolic function with an aortic valve area of 1.14 cm with a mean gradient of 24 mmHg.  This has remained stable.  His ascending thoracic aorta measures 44 mm.  This will be repeated on an annual basis.

## 2023-01-28 NOTE — Assessment & Plan Note (Signed)
Mr. Haugen has had 5 episodes of syncope since I last saw him.  His episodes occur suddenly without warning.  It is unclear the etiology.  He does have moderate aortic stenosis but no other symptoms associated with this.  It certainly possible this is arrhythmogenically mediated.  I am going to place a 2-week Zio patch.  If this is unrevealing he may be a candidate for a loop recorder implantation.

## 2023-01-28 NOTE — Patient Instructions (Signed)
Medication Instructions:  Your physician recommends that you continue on your current medications as directed. Please refer to the Current Medication list given to you today.  *If you need a refill on your cardiac medications before your next appointment, please call your pharmacy*   Testing/Procedures: Your physician has requested that you have a carotid duplex. This test is an ultrasound of the carotid arteries in your neck. It looks at blood flow through these arteries that supply the brain with blood. Allow one hour for this exam. There are no restrictions or special instructions. This will take place at [address] [facility], [address].   Your physician has requested that you have an echocardiogram. Echocardiography is a painless test that uses sound waves to create images of your heart. It provides your doctor with information about the size and shape of your heart and how well your heart's chambers and valves are working. This procedure takes approximately one hour. There are no restrictions for this procedure. Please do NOT wear cologne, perfume, aftershave, or lotions (deodorant is allowed). Please arrive 15 minutes prior to your appointment time. This will take place at [address] **To do in December**    [employer] Cardiac Event Monitor Instructions  Your physician has requested you wear your cardiac event monitor for 30 days. [employer] may call or text to confirm a shipping address. The monitor will be sent to a land address via UPS. [employer] will not ship a monitor to a PO BOX. It typically takes 3-5 days to receive your monitor after it has been enrolled. [employer] will assist with USPS tracking if your package is delayed. The telephone number for [employer] is [phone]. Once you have received your monitor, please review the enclosed instructions. Instruction tutorials can also be viewed under help and settings on the enclosed cell phone. Your monitor has  already been registered assigning a specific monitor serial # to you.  Billing and Self Pay Discount Information  [employer] has been provided the insurance information we had on file for you.  If your insurance has been updated, please call [employer] at [phone] to provide them with your updated insurance information.   [employer] offers a discounted Self Pay option for patients who have insurance that does not cover their cardiac event monitor or patients without insurance.  The discounted cost of a Self Pay Cardiac Event Monitor would be $225.00 , if the patient contacts [employer] at [phone] within 7 days of applying the monitor to make payment arrangements.  If the patient does not contact [employer] within 7 days of applying the monitor, the cost of the cardiac event monitor will be $350.00.  Applying the monitor  Remove cell phone from case and turn it on. The cell phone works as IT consultant and needs to be within [employer] of you at all times. The cell phone will need to be charged on a daily basis. We recommend you plug the cell phone into the enclosed charger at your bedside table every night.  Monitor batteries: You will receive two monitor batteries labelled #1 and #2. These are your recorders. Plug battery #2 onto the second connection on the enclosed charger. Keep one battery on the charger at all times. This will keep the monitor battery deactivated. It will also keep it fully charged for when you need to switch your monitor batteries. A small light will be blinking on the battery emblem when it is charging. The light on the battery emblem will remain  on when the battery is fully charged.  Open package of a Monitor strip. Insert battery #1 into black hood on strip and gently squeeze monitor battery onto connection as indicated in instruction booklet. Set aside while preparing skin.  Choose location for your strip, vertical or horizontal, as indicated in the  instruction booklet. Shave to remove all hair from location. There cannot be any lotions, oils, powders, or colognes on skin where monitor is to be applied. Wipe skin clean with enclosed Saline wipe. Dry skin completely.  Peel paper labeled #1 off the back of the Monitor strip exposing the adhesive. Place the monitor on the chest in the vertical or horizontal position shown in the instruction booklet. One arrow on the monitor strip must be pointing upward. Carefully remove paper labeled #2, attaching remainder of strip to your skin. Try not to create any folds or wrinkles in the strip as you apply it.  Firmly press and release the circle in the center of the monitor battery. You will hear a small beep. This is turning the monitor battery on. The heart emblem on the monitor battery will light up every 5 seconds if the monitor battery in turned on and connected to the patient securely. Do not push and hold the circle down as this turns the monitor battery off. The cell phone will locate the monitor battery. A screen will appear on the cell phone checking the connection of your monitor strip. This may read poor connection initially but change to good connection within the next minute. Once your monitor accepts the connection you will hear a series of 3 beeps followed by a climbing crescendo of beeps. A screen will appear on the cell phone showing the two monitor strip placement options. Touch the picture that demonstrates where you applied the monitor strip.  Your monitor strip and battery are waterproof. You are able to shower, bathe, or swim with the monitor on. They just ask you do not submerge deeper than 3 feet underwater. We recommend removing the monitor if you are swimming in a lake, river, or ocean.  Your monitor battery will need to be switched to a fully charged monitor battery approximately once a week. The cell phone will alert you of an action which needs to be made.  On the cell  phone, tap for details to reveal connection status, monitor battery status, and cell phone battery status. The green dots indicates your monitor is in good status. A red dot indicates there is something that needs your attention.  To record a symptom, click the circle on the monitor battery. In 30-60 seconds a list of symptoms will appear on the cell phone. Select your symptom and tap save. Your monitor will record a sustained or significant arrhythmia regardless of you clicking the button. Some patients do not feel the heart rhythm irregularities. [employer] will notify us of any serious or critical events.  Refer to instruction booklet for instructions on switching batteries, changing strips, the Do not disturb or Pause features, or any additional questions.  Call [employer] at [phone], to confirm your monitor is transmitting and record your baseline. They will answer any questions you may have regarding the monitor instructions at that time.  Returning the monitor to [employer]  Place all equipment back into blue box. Peel off strip of paper to expose adhesive and close box securely. There is a prepaid UPS shipping label on this box. Drop in a UPS drop box, or at a UPS facility like [employer].  You may also contact [employer] to arrange UPS to pick up monitor package at your home.    Follow-Up: At [facility], you and your health needs are our priority.  As part of our continuing mission to provide you with exceptional heart care, we have created designated Provider Care Teams.  These Care Teams include your primary Cardiologist (physician) and Advanced Practice Providers (APPs -  Physician Assistants and Nurse Practitioners) who all work together to provide you with the care you need, when you need it.  We recommend signing up for the patient portal called "MyChart".  Sign up information is provided on this After Visit Summary.  MyChart is used to connect with patients for  Virtual Visits (Telemedicine).  Patients are able to view lab/test results, encounter notes, upcoming appointments, etc.  Non-urgent messages can be sent to your provider as well.   To learn more about what you can do with MyChart, go to [email or web address].    Your next appointment:   12 month(s)  Provider:   Nanetta Batty, MD

## 2023-01-30 ENCOUNTER — Ambulatory Visit: Payer: Medicare Other | Admitting: Podiatry

## 2023-02-12 ENCOUNTER — Ambulatory Visit (HOSPITAL_COMMUNITY)
Admission: RE | Admit: 2023-02-12 | Discharge: 2023-02-12 | Disposition: A | Discharge status: Home / Self Care | Payer: Medicare Other | Source: Ambulatory Visit | Attending: Cardiovascular Disease | Admitting: Cardiovascular Disease

## 2023-02-12 DIAGNOSIS — I4891 Unspecified atrial fibrillation: Secondary | ICD-10-CM | POA: Insufficient documentation

## 2023-02-12 DIAGNOSIS — I10 Essential (primary) hypertension: Secondary | ICD-10-CM | POA: Diagnosis present

## 2023-02-12 DIAGNOSIS — R55 Syncope and collapse: Secondary | ICD-10-CM | POA: Insufficient documentation

## 2023-02-12 DIAGNOSIS — I350 Nonrheumatic aortic (valve) stenosis: Secondary | ICD-10-CM | POA: Diagnosis present

## 2023-02-12 DIAGNOSIS — E782 Mixed hyperlipidemia: Secondary | ICD-10-CM | POA: Insufficient documentation

## 2023-02-19 ENCOUNTER — Ambulatory Visit: Payer: Medicare Other | Attending: Cardiovascular Disease

## 2023-02-19 ENCOUNTER — Telehealth: Payer: Self-pay | Admitting: Cardiovascular Disease

## 2023-02-19 DIAGNOSIS — I452 Bifascicular block: Secondary | ICD-10-CM | POA: Diagnosis present

## 2023-02-19 DIAGNOSIS — R55 Syncope and collapse: Secondary | ICD-10-CM

## 2023-02-19 DIAGNOSIS — I4891 Unspecified atrial fibrillation: Secondary | ICD-10-CM | POA: Diagnosis present

## 2023-02-19 DIAGNOSIS — I350 Nonrheumatic aortic (valve) stenosis: Secondary | ICD-10-CM

## 2023-02-19 NOTE — Telephone Encounter (Signed)
Received a call from Marisue Ivan at [employer] to report patient's baseline EKG  revealed Afib rate 60.She will upload for Dr.Berry to review.

## 2023-02-19 NOTE — Progress Notes (Unsigned)
[employer] [id number] mailed to patient 01/28/23, applied in office.

## 2023-02-19 NOTE — Telephone Encounter (Signed)
Calling with abn results

## 2023-02-26 ENCOUNTER — Encounter: Payer: Self-pay | Admitting: Podiatry

## 2023-02-26 ENCOUNTER — Ambulatory Visit (INDEPENDENT_AMBULATORY_CARE_PROVIDER_SITE_OTHER): Payer: Medicare Other | Admitting: Podiatry

## 2023-02-26 DIAGNOSIS — D689 Coagulation defect, unspecified: Secondary | ICD-10-CM | POA: Diagnosis not present

## 2023-02-26 DIAGNOSIS — M79676 Pain in unspecified toe(s): Secondary | ICD-10-CM

## 2023-02-26 DIAGNOSIS — B351 Tinea unguium: Secondary | ICD-10-CM | POA: Diagnosis not present

## 2023-03-01 NOTE — Progress Notes (Signed)
  Subjective:  Patient ID: Zachary Underwood, male    DOB: [birth year],  MRN: [id number]  89 y.o. male presents painful elongated mycotic toenails 1-5 bilaterally which are tender when wearing enclosed shoe gear. Pain is relieved with periodic professional debridement. Chief Complaint  Patient presents with   RFC    RFC   New problem(s): None   PCP is Estevan Oaks, NP.  Allergies  Allergen Reactions   Short Ragweed Pollen Ext     Other Reaction(s): other   Beef-Derived Products     Patient does not eat beef    Eliquis [Apixaban]    Lasix [Furosemide]    Other Other (See Comments) and Hypertension    Fusid- Legs became very swollen   Tylenol [Acetaminophen] Hives   Review of Systems: Negative except as noted in the HPI.   Objective:  Zachary Underwood is a pleasant 89 y.o. male WD, WN in NAD. AAO x 3.  Vascular Examination: Vascular status intact b/l with palpable pedal pulses. CFT immediate b/l. Pedal hair present. No edema. No pain with calf compression b/l. Skin temperature gradient WNL b/l. No varicosities noted. No cyanosis or clubbing noted.  Neurological Examination: Sensation grossly intact b/l with 10 gram monofilament. Vibratory sensation intact b/l.  Dermatological Examination: Pedal skin with normal turgor, texture and tone b/l. No open wounds nor interdigital macerations noted. Toenails 1-5 b/l thick, discolored, elongated with subungual debris and pain on dorsal palpation. No hyperkeratotic lesions noted b/l.   Musculoskeletal Examination: Muscle strength 5/5 to b/l LE.  No pain, crepitus noted b/l. No gross pedal deformities. Patient ambulates independently without assistive aids.   Radiographs: None  Last A1c:       No data to display           Assessment:   1. Pain due to onychomycosis of toenail   2. Coagulation defect [facility])    Plan:  Patient was evaluated and treated. All patient's and/or POA's questions/concerns addressed on today's visit.  Mycotic toenails 1-5 debrided in length and girth without incident. Continue soft, supportive shoe gear daily. Report any pedal injuries to medical professional. Call office if there are any quesitons/concerns. -Patient/POA to call should there be question/concern in the interim.  Return in about 9 weeks (around 04/30/2023).  Freddie Breech, DPM

## 2023-03-13 ENCOUNTER — Telehealth: Payer: Self-pay

## 2023-03-13 NOTE — Telephone Encounter (Signed)
Received critical results from [employer] reporting 4 sec pause Afib w/VCD. Reviewed by Dr Royann Shivers with no changes and continue with monitor. Spoke to patient he was asleep when event occurred and asymptomatic.

## 2023-03-18 ENCOUNTER — Telehealth: Payer: Self-pay | Admitting: Cardiovascular Disease

## 2023-03-18 NOTE — Telephone Encounter (Signed)
Patient dropped off some information for provider In box

## 2023-03-25 NOTE — Telephone Encounter (Signed)
Received 2 EKG strips from [employer].EKG on 11/9 at 11:22:08 pm revealed atrial fib rate 20.EKG on 11/17 at 10:13:59 pm revealed atrial fib rate 20.Spoke to patient he was unaware of both episodes.DOD Dr.Tobb reviewed both strips.She advised continue same medications,ok to wait for final report. Patient stated he brought in a article for Dr.Berry to read last week.Advised I will send message to Dr.Berry's RN.

## 2023-03-28 ENCOUNTER — Other Ambulatory Visit: Payer: Self-pay

## 2023-03-28 DIAGNOSIS — I4892 Unspecified atrial flutter: Secondary | ICD-10-CM

## 2023-03-28 DIAGNOSIS — I4891 Unspecified atrial fibrillation: Secondary | ICD-10-CM

## 2023-03-29 ENCOUNTER — Encounter (HOSPITAL_COMMUNITY): Payer: Self-pay | Admitting: Internal Medicine

## 2023-03-29 ENCOUNTER — Ambulatory Visit (HOSPITAL_COMMUNITY)
Admission: RE | Admit: 2023-03-29 | Discharge: 2023-03-29 | Disposition: A | Discharge status: Home / Self Care | Payer: Medicare Other | Source: Ambulatory Visit | Attending: Internal Medicine | Admitting: Internal Medicine

## 2023-03-29 VITALS — BP 110/60 | HR 58 | Ht 68.0 in | Wt 188.8 lb

## 2023-03-29 DIAGNOSIS — D6869 Other thrombophilia: Secondary | ICD-10-CM | POA: Diagnosis not present

## 2023-03-29 DIAGNOSIS — I350 Nonrheumatic aortic (valve) stenosis: Secondary | ICD-10-CM | POA: Diagnosis not present

## 2023-03-29 DIAGNOSIS — I4821 Permanent atrial fibrillation: Secondary | ICD-10-CM

## 2023-03-29 DIAGNOSIS — I4892 Unspecified atrial flutter: Secondary | ICD-10-CM | POA: Insufficient documentation

## 2023-03-29 DIAGNOSIS — I10 Essential (primary) hypertension: Secondary | ICD-10-CM | POA: Insufficient documentation

## 2023-03-29 DIAGNOSIS — Z8673 Personal history of transient ischemic attack (TIA), and cerebral infarction without residual deficits: Secondary | ICD-10-CM | POA: Insufficient documentation

## 2023-03-29 DIAGNOSIS — I4891 Unspecified atrial fibrillation: Secondary | ICD-10-CM

## 2023-03-29 DIAGNOSIS — Z7901 Long term (current) use of anticoagulants: Secondary | ICD-10-CM | POA: Insufficient documentation

## 2023-03-29 NOTE — Progress Notes (Signed)
Primary Care Physician: Estevan Oaks, NP Primary Cardiologist: None Electrophysiologist: None     Referring Physician: Dr. Phil Dopp is a 89 y.o. male with a history of CVA, aortic atherosclerosis, HLD, HTN, aortic stenosis, and permanent atrial fibrillation who presents for consultation in the [facility]. Patient is on Xarelto 15 mg daily for a CHADS2VASC score of 6.  On evaluation today, he is currently in rate controlled Afib. Seen by Dr. Allyson Sabal in October and monitor placed due to report of multiple episodes of possible syncope. He reports to me today as well as his wife that they don't believe his episodes were necessarily syncopal in nature. He reports that he was aware while he was falling. He is on Xarelto 15 mg daily.   Today, he denies symptoms of palpitations, chest pain, shortness of breath, orthopnea, PND, lower extremity edema, dizziness, presyncope, syncope, snoring, daytime somnolence, bleeding, or neurologic sequela. The patient is tolerating medications without difficulties and is otherwise without complaint today.   he has a BMI of Body mass index is 28.71 kg/m.Marland Kitchen Filed Weights   03/29/23 1155  Weight: 85.6 kg    Current Outpatient Medications  Medication Sig Dispense Refill   atorvastatin (LIPITOR) 10 MG tablet Take 1/2 tablet (5 mg total) by mouth daily. 30 tablet 0   candesartan (ATACAND) 4 MG tablet Take 4 mg by mouth daily. Take 1/2 tablet at bedtime     ibuprofen (ADVIL) 200 MG tablet Take 200 mg by mouth every 6 (six) hours as needed.     loratadine (CLARITIN) 10 MG tablet Take 1 tablet by mouth daily.     metolazone (ZAROXOLYN) 2.5 MG tablet Take 1 tablet (2.5 mg total) by mouth daily. 30 tablet 0   Rivaroxaban (XARELTO) 15 MG TABS tablet Take 15 mg by mouth daily with supper.     tamsulosin (FLOMAX) 0.4 MG CAPS capsule Take 0.4 mg by mouth at bedtime.     No current facility-administered medications for  this encounter.    Atrial Fibrillation Management history:  Previous antiarrhythmic drugs: none Previous cardioversions:  Previous ablations:  Anticoagulation history: Xarelto 15 mg daily   ROS- All systems are reviewed and negative except as per the HPI above.  Physical Exam: BP 110/60   Pulse (!) 58   Ht 5\' 8"  (1.727 m)   Wt 85.6 kg   BMI 28.71 kg/m   GEN: Well nourished, well developed in no acute distress NECK: No JVD; No carotid bruits CARDIAC: Irregularly irregular rate and rhythm, no murmurs, rubs, gallops RESPIRATORY:  Clear to auscultation without rales, wheezing or rhonchi  ABDOMEN: Soft, non-tender, non-distended EXTREMITIES:  No edema; No deformity   EKG today demonstrates  Vent. rate 58 BPM PR interval * ms QRS duration 106 ms QT/QTcB 418/410 ms P-R-T axes * 75 -26 Atrial fibrillation with slow ventricular response with premature ventricular or aberrantly conducted complexes Incomplete right bundle branch block Nonspecific ST and T wave abnormality Abnormal ECG When compared with ECG of 28-Jan-2023 14:46, PREVIOUS ECG IS PRESENT  Echo 04/17/22 demonstrated: 1. Left ventricular ejection fraction by 3D volume is 60 %. The left  ventricle has normal function. The left ventricle has no regional wall  motion abnormalities. There is mild concentric left ventricular  hypertrophy. Left ventricular diastolic function  could not be evaluated.   2. Right ventricular systolic function is normal. The right ventricular  size is normal.   3. Left atrial  size was severely dilated.   4. Right atrial size was severely dilated.   5. The mitral valve is degenerative. Mild mitral valve regurgitation.  Mild mitral stenosis. The mean mitral valve gradient is 4.0 mmHg. Moderate  mitral annular calcification.   6. The aortic valve is calcified. There is moderate calcification of the  aortic valve. Aortic valve regurgitation is mild. Moderate aortic valve  stenosis. Aortic  valve area, by VTI measures 1.14 cm. Aortic valve mean  gradient measures 24.0 mmHg. Aortic   valve Vmax measures 3.68 m/s.   7. Aortic dilatation noted. There is dilatation of the aortic root,  measuring 40 mm. There is dilatation of the ascending aorta, measuring 44  mm.   8. The inferior vena cava is normal in size with greater than 50%  respiratory variability, suggesting right atrial pressure of 3 mmHg.   Comparison(s): EF 70%, mild LVH, moderate AI, moderate AS mean 21.6, peak  40.6 mmHg, asc aor 47mm.   ASSESSMENT & PLAN CHA2DS2-VASc Score = 6  The patient's score is based upon: CHF History: 0 HTN History: 1 Diabetes History: 0 Stroke History: 2 Vascular Disease History: 1 Age Score: 2 Gender Score: 0       ASSESSMENT AND PLAN: Permanent Atrial Fibrillation (ICD10:  I48.11) The patient's CHA2DS2-VASc score is 6, indicating a 9.7% annual risk of stroke.    He is currently in permanent Afib. Cardiac monitor worn showed 100% Afib/flutter with overall controlled rates. Multiple pauses noted appear to be consistently nocturnal and not patient triggered. This is typically not an indication for pacemaker but will refer to EP to determine if a loop recorder would be reasonable in patient's case.   Secondary Hypercoagulable State (ICD10:  D68.69) The patient is at significant risk for stroke/thromboembolism based upon his CHA2DS2-VASc Score of 6.  Continue Rivaroxaban (Xarelto).      Will refer to EP to discuss possibility of loop placement.   Lake Bells, PA-C  Afib Clinic [facility] [address] [place], [place] [address] [phone]

## 2023-04-05 ENCOUNTER — Ambulatory Visit (HOSPITAL_COMMUNITY): Payer: Medicare Other | Attending: Cardiovascular Disease

## 2023-04-05 DIAGNOSIS — I4891 Unspecified atrial fibrillation: Secondary | ICD-10-CM | POA: Diagnosis present

## 2023-04-05 DIAGNOSIS — I10 Essential (primary) hypertension: Secondary | ICD-10-CM | POA: Diagnosis not present

## 2023-04-05 DIAGNOSIS — I350 Nonrheumatic aortic (valve) stenosis: Secondary | ICD-10-CM | POA: Diagnosis present

## 2023-04-05 DIAGNOSIS — E782 Mixed hyperlipidemia: Secondary | ICD-10-CM | POA: Diagnosis not present

## 2023-04-05 DIAGNOSIS — R55 Syncope and collapse: Secondary | ICD-10-CM | POA: Insufficient documentation

## 2023-04-05 LAB — ECHOCARDIOGRAM COMPLETE
AR max vel: 1.43 cm2
AV Area VTI: 1.57 cm2
AV Area mean vel: 1.54 cm2
AV Mean grad: 27 mm[Hg]
AV Peak grad: 49 mm[Hg]
Ao pk vel: 3.5 m/s
Area-P 1/2: 3.02 cm2
P 1/2 time: 652 ms
S' Lateral: 1.9 cm

## 2023-04-05 NOTE — Telephone Encounter (Signed)
Monitor resulted on 12/4. Pt is aware of results and referrals/follow up has been addressed. See result note for more information.

## 2023-04-09 ENCOUNTER — Other Ambulatory Visit (HOSPITAL_COMMUNITY): Payer: Self-pay

## 2023-04-09 DIAGNOSIS — I10 Essential (primary) hypertension: Secondary | ICD-10-CM

## 2023-04-09 DIAGNOSIS — E782 Mixed hyperlipidemia: Secondary | ICD-10-CM

## 2023-04-09 DIAGNOSIS — I4821 Permanent atrial fibrillation: Secondary | ICD-10-CM

## 2023-04-09 DIAGNOSIS — I350 Nonrheumatic aortic (valve) stenosis: Secondary | ICD-10-CM

## 2023-05-07 ENCOUNTER — Ambulatory Visit: Payer: Medicare Other | Admitting: Podiatry

## 2023-05-14 ENCOUNTER — Ambulatory Visit: Payer: Medicare Other | Admitting: Podiatry

## 2023-05-23 ENCOUNTER — Encounter: Payer: Self-pay | Admitting: Podiatry

## 2023-05-23 ENCOUNTER — Ambulatory Visit (INDEPENDENT_AMBULATORY_CARE_PROVIDER_SITE_OTHER): Payer: Medicare Other | Admitting: Podiatry

## 2023-05-23 DIAGNOSIS — D2371 Other benign neoplasm of skin of right lower limb, including hip: Secondary | ICD-10-CM

## 2023-05-23 DIAGNOSIS — B351 Tinea unguium: Secondary | ICD-10-CM | POA: Diagnosis not present

## 2023-05-23 DIAGNOSIS — D689 Coagulation defect, unspecified: Secondary | ICD-10-CM | POA: Diagnosis not present

## 2023-05-23 DIAGNOSIS — M79676 Pain in unspecified toe(s): Secondary | ICD-10-CM | POA: Diagnosis not present

## 2023-05-23 NOTE — Progress Notes (Signed)
He presents today with his cane and chief complaint of painful elongated toenails.  Objective: Vitals are stable alert oriented x 3.  Toenails are long thick yellow dystrophic with mycotic multiple benign skin lesions particular porokeratotic lesion plantar aspect of his right heel.  No open lesions or wounds.  Assessment: Pain limb secondary to onychomycosis and benign skin lesion.  Plan: Debridement of benign skin lesions and mycotic nails 1 through 5 bilateral.

## 2023-07-24 ENCOUNTER — Observation Stay (HOSPITAL_COMMUNITY)

## 2023-07-24 ENCOUNTER — Emergency Department (HOSPITAL_BASED_OUTPATIENT_CLINIC_OR_DEPARTMENT_OTHER)

## 2023-07-24 ENCOUNTER — Emergency Department (HOSPITAL_COMMUNITY)

## 2023-07-24 ENCOUNTER — Other Ambulatory Visit: Payer: Self-pay

## 2023-07-24 ENCOUNTER — Inpatient Hospital Stay (HOSPITAL_BASED_OUTPATIENT_CLINIC_OR_DEPARTMENT_OTHER)
Admission: EM | Admit: 2023-07-24 | Discharge: 2023-07-29 | DRG: 067 | Disposition: A | Discharge status: Home / Self Care | Attending: Internal Medicine | Admitting: Internal Medicine

## 2023-07-24 ENCOUNTER — Encounter (HOSPITAL_BASED_OUTPATIENT_CLINIC_OR_DEPARTMENT_OTHER): Payer: Self-pay | Admitting: *Deleted

## 2023-07-24 DIAGNOSIS — R4701 Aphasia: Secondary | ICD-10-CM | POA: Diagnosis present

## 2023-07-24 DIAGNOSIS — G459 Transient cerebral ischemic attack, unspecified: Secondary | ICD-10-CM | POA: Diagnosis not present

## 2023-07-24 DIAGNOSIS — I6523 Occlusion and stenosis of bilateral carotid arteries: Principal | ICD-10-CM | POA: Diagnosis present

## 2023-07-24 DIAGNOSIS — Z886 Allergy status to analgesic agent status: Secondary | ICD-10-CM

## 2023-07-24 DIAGNOSIS — R4781 Slurred speech: Secondary | ICD-10-CM | POA: Diagnosis present

## 2023-07-24 DIAGNOSIS — I700 Atherosclerosis of aorta: Secondary | ICD-10-CM | POA: Diagnosis present

## 2023-07-24 DIAGNOSIS — Z7901 Long term (current) use of anticoagulants: Secondary | ICD-10-CM

## 2023-07-24 DIAGNOSIS — Z87891 Personal history of nicotine dependence: Secondary | ICD-10-CM

## 2023-07-24 DIAGNOSIS — N400 Enlarged prostate without lower urinary tract symptoms: Secondary | ICD-10-CM | POA: Diagnosis present

## 2023-07-24 DIAGNOSIS — S8002XA Contusion of left knee, initial encounter: Secondary | ICD-10-CM | POA: Diagnosis present

## 2023-07-24 DIAGNOSIS — R42 Dizziness and giddiness: Secondary | ICD-10-CM | POA: Diagnosis present

## 2023-07-24 DIAGNOSIS — Z7982 Long term (current) use of aspirin: Secondary | ICD-10-CM

## 2023-07-24 DIAGNOSIS — M1711 Unilateral primary osteoarthritis, right knee: Secondary | ICD-10-CM | POA: Diagnosis present

## 2023-07-24 DIAGNOSIS — R471 Dysarthria and anarthria: Secondary | ICD-10-CM | POA: Diagnosis present

## 2023-07-24 DIAGNOSIS — I350 Nonrheumatic aortic (valve) stenosis: Secondary | ICD-10-CM | POA: Diagnosis present

## 2023-07-24 DIAGNOSIS — R200 Anesthesia of skin: Secondary | ICD-10-CM | POA: Diagnosis not present

## 2023-07-24 DIAGNOSIS — E785 Hyperlipidemia, unspecified: Secondary | ICD-10-CM | POA: Diagnosis present

## 2023-07-24 DIAGNOSIS — S8001XA Contusion of right knee, initial encounter: Secondary | ICD-10-CM | POA: Diagnosis present

## 2023-07-24 DIAGNOSIS — W19XXXA Unspecified fall, initial encounter: Secondary | ICD-10-CM | POA: Diagnosis present

## 2023-07-24 DIAGNOSIS — I110 Hypertensive heart disease with heart failure: Secondary | ICD-10-CM | POA: Diagnosis present

## 2023-07-24 DIAGNOSIS — Z79899 Other long term (current) drug therapy: Secondary | ICD-10-CM

## 2023-07-24 DIAGNOSIS — Z5982 Transportation insecurity: Secondary | ICD-10-CM

## 2023-07-24 DIAGNOSIS — I4892 Unspecified atrial flutter: Secondary | ICD-10-CM | POA: Diagnosis present

## 2023-07-24 DIAGNOSIS — I5032 Chronic diastolic (congestive) heart failure: Secondary | ICD-10-CM | POA: Diagnosis present

## 2023-07-24 DIAGNOSIS — Z888 Allergy status to other drugs, medicaments and biological substances status: Secondary | ICD-10-CM

## 2023-07-24 DIAGNOSIS — R202 Paresthesia of skin: Secondary | ICD-10-CM

## 2023-07-24 DIAGNOSIS — I4821 Permanent atrial fibrillation: Secondary | ICD-10-CM | POA: Diagnosis present

## 2023-07-24 DIAGNOSIS — R001 Bradycardia, unspecified: Secondary | ICD-10-CM | POA: Diagnosis present

## 2023-07-24 DIAGNOSIS — I4510 Unspecified right bundle-branch block: Secondary | ICD-10-CM | POA: Diagnosis present

## 2023-07-24 DIAGNOSIS — S72431A Displaced fracture of medial condyle of right femur, initial encounter for closed fracture: Secondary | ICD-10-CM | POA: Diagnosis present

## 2023-07-24 DIAGNOSIS — I711 Thoracic aortic aneurysm, ruptured, unspecified: Secondary | ICD-10-CM | POA: Diagnosis present

## 2023-07-24 DIAGNOSIS — G8929 Other chronic pain: Secondary | ICD-10-CM | POA: Diagnosis present

## 2023-07-24 DIAGNOSIS — Z8673 Personal history of transient ischemic attack (TIA), and cerebral infarction without residual deficits: Secondary | ICD-10-CM

## 2023-07-24 LAB — COMPREHENSIVE METABOLIC PANEL WITH GFR
ALT: 14 U/L (ref 0–44)
AST: 19 U/L (ref 15–41)
Albumin: 4.3 g/dL (ref 3.5–5.0)
Alkaline Phosphatase: 98 U/L (ref 38–126)
Anion gap: 8 (ref 5–15)
BUN: 27 mg/dL — ABNORMAL HIGH (ref 8–23)
CO2: 28 mmol/L (ref 22–32)
Calcium: 10.3 mg/dL (ref 8.9–10.3)
Chloride: 104 mmol/L (ref 98–111)
Creatinine, Ser: 1.2 mg/dL (ref 0.61–1.24)
GFR, Estimated: 56 mL/min — ABNORMAL LOW (ref >60)
Glucose, Bld: 91 mg/dL (ref 70–99)
Potassium: 3.9 mmol/L (ref 3.5–5.1)
Sodium: 140 mmol/L (ref 135–145)
Total Bilirubin: 1 mg/dL (ref 0.0–1.2)
Total Protein: 7 g/dL (ref 6.5–8.1)

## 2023-07-24 LAB — URINALYSIS, ROUTINE W REFLEX MICROSCOPIC
Bilirubin Urine: NEGATIVE
Glucose, UA: NEGATIVE mg/dL
Hgb urine dipstick: NEGATIVE
Ketones, ur: NEGATIVE mg/dL
Leukocytes,Ua: NEGATIVE
Nitrite: NEGATIVE
Protein, ur: NEGATIVE mg/dL
Specific Gravity, Urine: 1.018 (ref 1.005–1.030)
pH: 5.5 (ref 5.0–8.0)

## 2023-07-24 LAB — DIFFERENTIAL
Abs Immature Granulocytes: 0.04 10*3/uL (ref 0.00–0.07)
Basophils Absolute: 0.1 10*3/uL (ref 0.0–0.1)
Basophils Relative: 1 %
Eosinophils Absolute: 0.2 10*3/uL (ref 0.0–0.5)
Eosinophils Relative: 2 %
Immature Granulocytes: 1 %
Lymphocytes Relative: 10 %
Lymphs Abs: 0.7 10*3/uL (ref 0.7–4.0)
Monocytes Absolute: 0.5 10*3/uL (ref 0.1–1.0)
Monocytes Relative: 7 %
Neutro Abs: 5.6 10*3/uL (ref 1.7–7.7)
Neutrophils Relative %: 79 %

## 2023-07-24 LAB — ETHANOL: Alcohol, Ethyl (B): <10 mg/dL (ref <10)

## 2023-07-24 LAB — CBC
HCT: 40.9 % (ref 39.0–52.0)
Hemoglobin: 13.7 g/dL (ref 13.0–17.0)
MCH: 32.4 pg (ref 26.0–34.0)
MCHC: 33.5 g/dL (ref 30.0–36.0)
MCV: 96.7 fL (ref 80.0–100.0)
Platelets: 156 10*3/uL (ref 150–400)
RBC: 4.23 MIL/uL (ref 4.22–5.81)
RDW: 13.9 % (ref 11.5–15.5)
WBC: 6.9 10*3/uL (ref 4.0–10.5)
nRBC: 0 % (ref 0.0–0.2)

## 2023-07-24 LAB — RAPID URINE DRUG SCREEN, HOSP PERFORMED
Amphetamines: NOT DETECTED
Barbiturates: NOT DETECTED
Benzodiazepines: NOT DETECTED
Cocaine: NOT DETECTED
Opiates: NOT DETECTED
Tetrahydrocannabinol: NOT DETECTED

## 2023-07-24 LAB — PROTIME-INR
INR: 1.6 — ABNORMAL HIGH (ref 0.8–1.2)
Prothrombin Time: 19.6 s — ABNORMAL HIGH (ref 11.4–15.2)

## 2023-07-24 LAB — APTT: aPTT: 43 s — ABNORMAL HIGH (ref 24–36)

## 2023-07-24 LAB — CBG MONITORING, ED: Glucose-Capillary: 109 mg/dL — ABNORMAL HIGH (ref 70–99)

## 2023-07-24 MED ORDER — MELATONIN 5 MG PO TABS: 5.0000 mg | ORAL_TABLET | Freq: Every evening | ORAL | Status: DC | PRN

## 2023-07-24 MED ORDER — OXYCODONE HCL 5 MG PO TABS: 5.0000 mg | ORAL_TABLET | Freq: Four times a day (QID) | ORAL | Status: DC | PRN

## 2023-07-24 MED ORDER — IOHEXOL 350 MG/ML SOLN
100.0000 mL | Freq: Once | INTRAVENOUS | Status: AC | PRN
  Administered 2023-07-24: 75 mL via INTRAVENOUS

## 2023-07-24 MED ORDER — STROKE: EARLY STAGES OF RECOVERY BOOK
Freq: Once | Status: AC
Start: 2023-07-25 — End: 2023-07-25
  Filled 2023-07-24: qty 1

## 2023-07-24 MED ORDER — LIDOCAINE 5 % EX PTCH
1.0000 | MEDICATED_PATCH | CUTANEOUS | Status: DC
  Administered 2023-07-25 – 2023-07-28 (×5): 1 via TRANSDERMAL
  Filled 2023-07-24 (×5): qty 1

## 2023-07-24 MED ORDER — TAMSULOSIN HCL 0.4 MG PO CAPS
0.4000 mg | ORAL_CAPSULE | Freq: Every day | ORAL | Status: DC
  Administered 2023-07-25 – 2023-07-28 (×5): 0.4 mg via ORAL
  Filled 2023-07-24 (×5): qty 1

## 2023-07-24 MED ORDER — ATORVASTATIN CALCIUM 40 MG PO TABS
40.0000 mg | ORAL_TABLET | Freq: Every day | ORAL | Status: DC
  Administered 2023-07-25: 40 mg via ORAL
  Filled 2023-07-24: qty 1

## 2023-07-24 MED ORDER — ATORVASTATIN CALCIUM 10 MG PO TABS: 20.0000 mg | ORAL_TABLET | Freq: Every day | ORAL | Status: DC

## 2023-07-24 MED ORDER — PROCHLORPERAZINE EDISYLATE 10 MG/2ML IJ SOLN: 5.0000 mg | Freq: Four times a day (QID) | INTRAMUSCULAR | Status: DC | PRN

## 2023-07-24 MED ORDER — LABETALOL HCL 5 MG/ML IV SOLN: 5.0000 mg | INTRAVENOUS | Status: DC | PRN

## 2023-07-24 MED ORDER — ASPIRIN 81 MG PO CHEW
81.0000 mg | CHEWABLE_TABLET | Freq: Once | ORAL | Status: AC
  Administered 2023-07-25: 81 mg via ORAL
  Filled 2023-07-24: qty 1

## 2023-07-24 MED ORDER — LACTATED RINGERS IV SOLN: INTRAVENOUS | Status: DC

## 2023-07-24 MED ORDER — POLYETHYLENE GLYCOL 3350 17 G PO PACK: 17.0000 g | PACK | Freq: Every day | ORAL | Status: DC | PRN

## 2023-07-24 NOTE — ED Provider Notes (Signed)
 [place] EMERGENCY DEPARTMENT AT [facility] Provider Note   CSN: [id number] Arrival date & time: 07/24/23  1220     History  Chief Complaint  Patient presents with   Aphasia   Dizziness    Zachary Underwood is a 89 y.o. 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  Patient went to bed at 11 PM last night and woke up at 8 AM feeling dizzy in which he feels unstable but has not fallen.  Wife states that he was reportedly slurring his words as well at 8:00 however this is since resolved.  Patient did take his medications without missing doses.  Patient states that he still feels dizzy and states that both of his hands feel numb but otherwise has no other symptoms.  Patient has any chest pain or shortness of breath or neck pain.  Patient denies any vision changes.  Patient also notes that he has chronic right knee pain however his right knee is flaring up as he did fall 2 weeks ago and landed on his right knee.  Patient has been able to walk since then at his baseline with a walker but denies any skin color changes.    Home Medications Prior to Admission medications   Medication Sig Start Date End Date Taking? Authorizing Provider  atorvastatin (LIPITOR) 10 MG tablet Take 10 mg by mouth at bedtime. 06/07/23  Yes [provider]  candesartan (ATACAND) 4 MG tablet Take 0.5 tablets by mouth daily. 06/07/23  Yes [provider]  HYDROcodone-acetaminophen (NORCO/VICODIN) 5-325 MG tablet Take 1 tablet by mouth 3 (three) times daily as needed. 07/23/23  Yes [provider]  atorvastatin (LIPITOR) 10 MG tablet Take 1/2 tablet (5 mg total) by mouth daily. 08/25/21   Maury Dus, MD  candesartan (ATACAND) 4 MG tablet Take 4 mg by mouth daily. Take 1/2 tablet at bedtime    [provider]  ibuprofen  (ADVIL) 200 MG tablet Take 200 mg by mouth every 6 (six) hours as needed.    [provider]  metolazone (ZAROXOLYN) 2.5 MG tablet Take 1 tablet (2.5 mg total) by mouth daily. 08/24/21   Maury Dus, MD  Rivaroxaban (XARELTO) 15 MG TABS tablet Take 15 mg by mouth daily with supper.    [provider]  tamsulosin (FLOMAX) 0.4 MG CAPS capsule Take 0.4 mg by mouth at bedtime.    [provider]      Allergies    Short ragweed pollen ext, Beef-derived drug products, Eliquis [apixaban], Lasix [furosemide], Other, and Tylenol [acetaminophen]    Review of Systems   Review of Systems  Neurological:  Positive for dizziness.    Physical Exam Updated Vital Signs BP (!) 151/78   Pulse (!) 42   Temp 98.9 F (37.2 C)   Resp 19   SpO2 96%  Physical Exam Vitals reviewed.  Constitutional:      General: He is not in acute distress. HENT:     Head: Normocephalic and atraumatic.  Eyes:     Extraocular Movements: Extraocular movements intact.     Conjunctiva/sclera: Conjunctivae normal.     Pupils: Pupils are equal, round, and reactive to light.  Cardiovascular:     Rate and Rhythm: Bradycardia present. Rhythm irregular.     Pulses: Normal pulses.     Heart sounds: Normal heart  sounds.     Comments: 2+ bilateral radial/dorsalis pedis pulses with regular rate Pulmonary:     Effort: Pulmonary effort is normal. No respiratory distress.     Breath sounds: Normal breath sounds.  Abdominal:     Palpations: Abdomen is soft.     Tenderness: There is no abdominal tenderness. There is no guarding or rebound.  Musculoskeletal:        General: Normal range of motion.     Cervical back: Normal range of motion and neck supple.     Comments: 5 out of 5 bilateral grip/leg extension strength  Skin:    General: Skin is warm and dry.     Capillary Refill: Capillary refill takes less than 2 seconds.  Neurological:     General: No focal deficit present.     Mental Status: He is  alert and oriented to person, place, and time.     Sensory: Sensation is intact.     Motor: Motor function is intact.     Coordination: Coordination is intact.     Comments: Sensation intact in all 4 limbs Cranial nerves III through XII intact Vision gross intact No nystagmus noted Visual fields appear intact  Psychiatric:        Mood and Affect: Mood normal.     ED Results / Procedures / Treatments   Labs (all labs ordered are listed, but only abnormal results are displayed) Labs Reviewed  PROTIME-INR - Abnormal; Notable for the following components:      Result Value   Prothrombin Time 19.6 (*)    INR 1.6 (*)    All other components within normal limits  APTT - Abnormal; Notable for the following components:   aPTT 43 (*)    All other components within normal limits  COMPREHENSIVE METABOLIC PANEL WITH GFR - Abnormal; Notable for the following components:   BUN 27 (*)    GFR, Estimated 56 (*)    All other components within normal limits  CBG MONITORING, ED - Abnormal; Notable for the following components:   Glucose-Capillary 109 (*)    All other components within normal limits  ETHANOL  CBC  DIFFERENTIAL  RAPID URINE DRUG SCREEN, HOSP PERFORMED  URINALYSIS, ROUTINE W REFLEX MICROSCOPIC    EKG EKG Interpretation Date/Time:  Wednesday July 24 2023 12:35:29 EDT Ventricular Rate:  61 PR Interval:    QRS Duration:  148 QT Interval:  466 QTC Calculation: 518 R Axis:   78  Text Interpretation: Atrial fibrillation Right bundle branch block No significant change since last tracing Confirmed by Elayne Snare ([id number]) on 07/24/2023 12:55:59 PM  Radiology No results found.  Procedures .Critical Care  Performed by: Netta Corrigan, PA-C Authorized by: Netta Corrigan, PA-C   Critical care provider statement:    Critical care time (minutes):  40   Critical care time was exclusive of:  Separately billable procedures and treating other patients   Critical care was  necessary to treat or prevent imminent or life-threatening deterioration of the following conditions: tPA consideration.   Critical care was time spent personally by me on the following activities:  Blood draw for specimens, development of treatment plan with patient or surrogate, discussions with consultants, evaluation of patient's response to treatment, examination of patient, obtaining history from patient or surrogate, review of old charts, re-evaluation of patient's condition, pulse oximetry, ordering and review of radiographic studies, ordering and review of laboratory studies and ordering and performing treatments and interventions   I assumed direction  of critical care for this patient from another provider in my specialty: no     Care discussed with: admitting provider       Medications Ordered in ED Medications  iohexol (OMNIPAQUE) 350 MG/ML injection 100 mL (75 mLs Intravenous Contrast Given 07/24/23 1431)    ED Course/ Medical Decision Making/ A&P                                 Medical Decision Making Amount and/or Complexity of Data Reviewed Labs: ordered. Radiology: ordered.  Risk Prescription drug management. Decision regarding hospitalization.   Darrick Huntsman 89 y.o. presented today for dizziness. Working DDx that I considered at this time includes, but not limited to, CVA/TIA, arrhythmia, CNS lesion, BPPV, vestibular labyrinthitis, Meniere's, Ramsey-Hunt, polypharmacy, orthostatic hypotension, electrolyte abnormalities.  R/o DDx: CNS lesion, BPPV, vestibular labyrinthitis, Meniere's, Ramsey-Hunt, polypharmacy, orthostatic hypotension, electrolyte abnormalities: These are considered less likely due to history of present illness, physical exam, lab/imaging findings  Review of prior external notes: 07/11/2023 office visit  Unique Tests and My Independent Interpretation:  EKG: A-fib 61 bpm, no obvious signs of ischemia CBG: 109 CBC: Unremarkable BMP:  Unremarkable UA: Unremarkable PT/INR: Unremarkable aPTT: Unremarkable Ethanol: Unremarkable UDS: Unremarkable CTA head and neck: Pending Right knee x-ray: Arthritis noted  Social Determinants of Health: none  Discussion with Independent Historian:  Caregiver  Discussion of Management of Tests:  Otelia Limes, MD Neurology ; Katrinka Blazing, MD Hospitalist  Risk: High: hospitalization or escalation of hospital-level care  Risk Stratification Score: None  Staffed with Theresia Lo, DO  Plan: On exam patient was no acute distress but was noted to be in A-fib with slowed response.  Patient's been taking his Xarelto without missing any doses. Physical exam showed general tenderness to the right knee however no obvious deformities.  Patient is neurovascular tact in all 4 extremities but does state that he feels persistently unstable and is endorsing numbness in both of his hands.  Patient has no neck pain or tenderness on exam.. The cardiac monitor was ordered secondary to the patient's history of dizziness and to monitor the patient for dysrhythmia. Cardiac monitor by my independent interpretation showed A-fib with slow response.  Given patient's history of stroke I do suspect that he may have had a TIA as symptoms reportedly included aphasia with dizziness that eventually got better.  Will get CT head along with CTA head and neck and consult neurology as I do suspect patient will need to be admitted.  At this facility we do not have MRI.  Will also get a right knee x-ray as well but do suspect that this is arthritis this patient states this been going on for quite some months.  In regards to patient's dizziness patient is on labetalol and normally is in the 50s however when I evaluated him his heart rate is in the 40s and will periodically drop into the 30s and so I do question if his dizziness is related to the A-fib with slowed response from the beta-blocker.  Code stroke was not initiated as patient woke up with  the symptoms that 8 AM and has no signs of LVO at this time.  I spoke to the neurologist and he agrees the patient will need to be admitted to [facility].  Will consult hospitalist.  I spoke to the hospitalist and patient was accepted for admission.  This chart was dictated using voice recognition software.  Despite best efforts  to proofread,  errors can occur which can change the documentation meaning.        Final Clinical Impression(s) / ED Diagnoses Final diagnoses:  TIA (transient ischemic attack)    Rx / DC Orders ED Discharge Orders     None         Netta Corrigan, PA-C 07/24/23 1434    Elayne Snare K, DO 07/24/23 1445

## 2023-07-24 NOTE — ED Notes (Signed)
 Called Karen at [facility] for transport 18:49

## 2023-07-24 NOTE — Progress Notes (Signed)
 Pt arrives from [facility] with stroke like symptoms, alert and oriented, denies any discomfort at this time, settled in bed with family at bedside, safety concern explained and initiated, admit Doc paged and notified on pt's arrival, was however reassured and will continue to monitor, v/s stable. Obasogie-Asidi, Bristyn Kulesza Efe

## 2023-07-24 NOTE — ED Triage Notes (Signed)
 Pt woke up this am around 8am and noticed that he had dizziness and slurred speech.  Pt went to bed around 11pm last pm and did not have any dficits.  Pt also notes that he has been having right knee pain since march 9th, pain is worse today than it has been for a while. Pt is alert and oriented.

## 2023-07-24 NOTE — ED Notes (Signed)
 Patient having brief periods of bradycardia dow to mid 30's   Provider notified

## 2023-07-24 NOTE — H&P (Addendum)
 History and Physical  LEMON STERNBERG ZOX:[id number] DOB: April 01, 1928 DOA: 07/24/2023  Referring physician: Accepted by Dr. Katrinka Blazing, [facility], Hospitalist service  PCP: Estevan Oaks, NP  Outpatient Specialists: Neurology, cardiology, orthopedic surgery. Patient coming from: Home  Chief Complaint: R knee pain and slurred speech.   HPI: Zachary Underwood is a 89 y.o. 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    States he wanted to be seen in the ER mainly because of the pain in his knees that caused him to fall 2 weeks ago.  And his slurred speech was another symptom that came on unexpectedly sometimes today.  States he had a cortisone shot in his right knee around that time, did not make much of a difference.  He has been compliant with his home Xarelto.  Last known well was last night around 11 PM prior to going to bed.  Also endorses dizziness, worse with movement.  In the ER, vital signs are stable.  CTA head and neck with and without contrast revealed no large vessel occlusion and no CT evidence of acute intracranial abnormality.  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  Approximately 70% stenosis of the proximal left cervical ICA, overall similar to prior.  Focal moderate stenosis of the right supraclinoid ICA increased from prior.  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  EDP discussed the case with neurology, who recommended admission for TIA/stroke workup.  Admitted by [facility], hospitalist  service.  At the time of this visit, the patient is accompanied in the room by his wife.  His slurred speech has resolved.  He is in no acute distress.  Endorses pain in his knees bilaterally right greater than left.  Right knee x-ray showing moderate degenerative joint disease.  No acute abnormality seen.  ED Course: Temperature 97.9.  BP 174/73, pulse 55, respiration 18, O2 saturation 99% on room air.  Review of Systems: Review of systems as noted in the HPI. All other systems reviewed and are negative.   Past Medical History:  Diagnosis Date   Arthritis    Cardiac arrhythmia    Hypertension    Ruptured aortic aneurysm [facility])    Stroke (HCC) 08/23/2021   Past Surgical History:  Procedure Laterality Date   open heart surgery     ruptured aortic aneurysm repair      Social History:  reports that he has quit smoking. His smoking use included cigarettes. He has never used smokeless tobacco. He reports that he does not drink alcohol and does not use drugs.   Allergies  Allergen Reactions   Short Ragweed Pollen Ext     Other Reaction(s): other   Beef-Derived Drug Products     Patient does not eat beef    Eliquis [Apixaban]    Lasix [Furosemide]    Other Other (See Comments) and Hypertension    Fusid- Legs became very swollen   Tylenol [Acetaminophen] Hives    Family history: None reported.  Prior to Admission medications   Medication Sig Start Date End Date Taking? Authorizing Provider  atorvastatin (LIPITOR) 10 MG tablet Take 10 mg by mouth at bedtime. 06/07/23  Yes [provider]  candesartan (ATACAND) 4 MG tablet Take 0.5 tablets by mouth daily. 06/07/23  Yes [provider]  HYDROcodone-acetaminophen (NORCO/VICODIN) 5-325 MG tablet Take 1 tablet by mouth 3 (three) times daily as needed. 07/23/23  Yes [provider]  atorvastatin (LIPITOR) 10 MG tablet Take 1/2 tablet (5 mg total) by mouth daily. 08/25/21   Maury Dus, MD  candesartan  (ATACAND) 4 MG tablet Take 4 mg by mouth daily. Take 1/2 tablet at bedtime    [provider]  ibuprofen (ADVIL) 200 MG tablet Take 200 mg by mouth every 6 (six) hours as needed.    [provider]  metolazone (ZAROXOLYN) 2.5 MG tablet Take 1 tablet (2.5 mg total) by mouth daily. 08/24/21   Maury Dus, MD  Rivaroxaban (XARELTO) 15 MG TABS tablet Take 15 mg by mouth daily with supper.    [provider]  tamsulosin (FLOMAX) 0.4 MG CAPS capsule Take 0.4 mg by mouth at bedtime.    [provider]    Physical Exam: BP (!) 174/73 (BP Location: Left Arm)   Pulse (!) 55   Temp 97.9 F (36.6 C) (Oral)   Resp 18   Ht 5\' 8"  (1.727 m)   Wt 83.9 kg   SpO2 99%   BMI 28.13 kg/m   General: 89 y.o. year-old male well developed well nourished in no acute distress.  Alert and oriented x3. Cardiovascular: Irregular rate and rhythm with no rubs or gallops.  No thyromegaly or JVD noted.  No lower extremity edema. 2/4 pulses in all 4 extremities. Respiratory: Clear to auscultation with no wheezes or rales. Good inspiratory effort. Abdomen: Soft nontender nondistended with normal bowel sounds x4 quadrants. Muskuloskeletal: No cyanosis, clubbing or edema noted bilaterally Neuro: CN II-XII intact, strength, sensation, reflexes Skin: No ulcerative lesions noted or rashes Psychiatry: Judgement and insight appear normal. Mood is appropriate for condition and setting          Labs on Admission:  Basic Metabolic Panel: Recent Labs  Lab 07/24/23 1242  NA 140  K 3.9  CL 104  CO2 28  GLUCOSE 91  BUN 27*  CREATININE 1.20  CALCIUM 10.3   Liver Function Tests: Recent Labs  Lab 07/24/23 1242  AST 19  ALT 14  ALKPHOS 98  BILITOT 1.0  PROT 7.0  ALBUMIN 4.3   No results for input(s): "LIPASE", "AMYLASE" in the last 168 hours. No results for input(s): "AMMONIA" in the last 168 hours. CBC: Recent Labs  Lab 07/24/23 1242  WBC 6.9  NEUTROABS 5.6  HGB 13.7   HCT 40.9  MCV 96.7  PLT 156   Cardiac Enzymes: No results for input(s): "CKTOTAL", "CKMB", "CKMBINDEX", "TROPONINI" in the last 168 hours.  BNP (last 3 results) No results for input(s): "BNP" in the last 8760 hours.  ProBNP (last 3 results) No results for input(s): "PROBNP" in the last 8760 hours.  CBG: Recent Labs  Lab 07/24/23 1226  GLUCAP 109*    Radiological Exams on Admission: CT Angio Head Neck W WO CM Result Date: 07/24/2023 CLINICAL DATA:  Transient ischemic attack, dizziness and slurred speech around 8 a.m. this morning. EXAM: CT ANGIOGRAPHY HEAD AND NECK WITH AND WITHOUT CONTRAST TECHNIQUE: Multidetector CT imaging of the head and neck was performed using the standard protocol during bolus administration of intravenous contrast. Multiplanar CT image reconstructions and MIPs were obtained to evaluate the vascular  anatomy. Carotid stenosis measurements (when applicable) are obtained utilizing NASCET criteria, using the distal internal carotid diameter as the denominator. RADIATION DOSE REDUCTION: This exam was performed according to the departmental dose-optimization program which includes automated exposure control, adjustment of the mA and/or kV according to patient size and/or use of iterative reconstruction technique. CONTRAST:  75mL OMNIPAQUE IOHEXOL 350 MG/ML SOLN COMPARISON:  CTA head and neck 08/23/2021, MRI head 08/22/2021. FINDINGS: CT HEAD FINDINGS Brain: No acute intracranial hemorrhage. No CT evidence of acute infarct. 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 Nonspecific hypoattenuation in the periventricular and subcortical white matter favored to reflect chronic microvascular ischemic changes. No edema, mass effect, or midline shift. The basilar cisterns are patent. Ventricles: Prominence of the ventricles suggestive of underlying parenchymal volume loss. Vascular: No hyperdense vessel. Intracranial atherosclerotic calcifications noted. Skull: No acute or aggressive  finding. Sinuses/orbits: The visualized paranasal sinuses are clear. Orbits are symmetric. Other: Mastoid air cells are clear. CTA NECK FINDINGS Aortic arch: Standard configuration of the aortic arch. Moderate atherosclerosis of the visualized aortic arch. The distal ascending aorta/proximal aortic arch measures of 4.1 cm in diameter. Imaged portion shows no evidence of dissection. No significant stenosis of the major arch vessel origins. Pulmonary arteries: As permitted by contrast timing, there are no filling defects in the visualized pulmonary arteries. Subclavian arteries: The subclavian arteries are patent bilaterally. Right carotid system: Patent from the origin to the skull base. Mild tortuosity of the proximal common carotid artery. Bulky calcified atherosclerosis at the carotid bifurcation extending into the proximal cervical ICA with increased circumferential calcification compared to the prior study. There is approximately 70% stenosis of the proximal right cervical ICA increased from prior. Left carotid system: Patent from the origin to the skull base. Mild tortuosity of the common carotid artery. Bulky calcified atherosclerosis at the carotid bifurcation resulting in approximately 70% stenosis of the proximal left cervical ICA overall similar to prior. Vertebral arteries: Right vertebral artery is dominant. No evidence of dissection, stenosis (50% or greater), or occlusion. Atherosclerosis involving the bilateral vertebral artery origins resulting in mild stenosis similar to prior. Skeleton: No acute findings. Degenerative changes in the cervical spine. Trace anterolisthesis of C5 on C6. Sequelae of median sternotomy. Other neck: The visualized airway is patent. No cervical lymphadenopathy. Upper chest: Visualized lung apices are clear. Review of the MIP images confirms the above findings CTA HEAD FINDINGS ANTERIOR CIRCULATION: The intracranial ICAs are patent bilaterally. Atherosclerosis of the carotid  siphons resulting in mild stenosis. There is additional moderate stenosis of the right supraclinoid ICA which appears slightly increased from prior. No significant stenosis, proximal occlusion, aneurysm, or vascular malformation. MCAs: The middle cerebral arteries are patent bilaterally. ACAs: The anterior cerebral arteries are patent bilaterally. POSTERIOR CIRCULATION: No significant stenosis, proximal occlusion, aneurysm, or vascular malformation. PCAs: Patent bilaterally.  Fetal origin of the left PCA. Pcomm: Visualized on the left. SCAs: The superior cerebellar arteries are patent bilaterally. Basilar artery: Patent AICAs: Patent PICAs: Patent Vertebral arteries: Patent bilaterally. Atherosclerosis of the right V4 segment resulting in mild stenosis. Venous sinuses: As permitted by contrast timing, patent. Anatomic variants: Fetal origin of the left PCA. Review of the MIP images confirms the above findings IMPRESSION: No large vessel occlusion. No CT evidence of acute intracranial abnormality. Multifocal atherosclerosis as above, slightly increased since 2023. 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 Approximately 70% stenosis of the proximal left cervical ICA, overall similar to prior. Focal moderate stenosis of the right supraclinoid ICA,  increased from prior. Moderate atherosclerosis of the aortic arch. Prominence of the distal ascending aorta/proximal arch measuring up to 4.1 cm, similar to prior. Chronic microvascular ischemic changes. 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 Electronically Signed   By: Emily Filbert M.D.   On: 07/24/2023 15:15   DG Knee Right Port Result Date: 07/24/2023 CLINICAL DATA:  Right knee pain for several weeks. EXAM: PORTABLE RIGHT KNEE - 1-2 VIEW COMPARISON:  None Available. FINDINGS: No evidence of fracture, dislocation, or joint effusion. Moderate narrowing of medial joint space is noted as well as patellofemoral space. Soft tissues are  unremarkable. IMPRESSION: Moderate degenerative joint disease as noted above. No acute abnormality seen. Electronically Signed   By: Lupita Raider M.D.   On: 07/24/2023 14:43    EKG: I independently viewed the EKG done and my findings are as followed: Atrial fibrillation rate of 61.  Nonspecific QTC changes.  QTc 515.  Assessment/Plan Present on Admission:  TIA (transient ischemic attack)  Principal Problem:   TIA (transient ischemic attack)  Slurred speech, resolved, rule out TIA versus CVA History of prior CVA CT angio head and neck showed evidence of prior left cerebellum CVA.  No LVO. Permissive hypertension, treat SBP greater than 220 or DBP greater than 120. Labetalol as needed with parameters PT/OT/speech therapist eval LDL, A1c, MRI brain, 2D echo Frequent neurochecks Per neurology, hold Xarelto.  If evidence of new stroke, consider switching to Pradaxa. Stroke protocol in place  Permanent A-fib on Xarelto Rate controlled Monitor on telemetry Per neurology, hold Xarelto.  If evidence of new stroke, consider switching to Pradaxa.  Hyperlipidemia Lipitor 40 mg daily Goal LDL less than 70  Hypertension Ongoing permissive hypertension Continue to closely monitor vital signs Treat SBP greater than 220 or DBP greater than 120  BPH Resume home tamsulosin Monitor urine output Monitor for urinary retention  Bilateral knee pain, right greater than left Follows with orthopedic surgery Right x-ray was non-acute As needed analgesics  Dilatation of the ascending aorta measuring 44 mm Follows with cardiology outpatient  QTc prolongation Admission 12 lead EKG with QTc of 515 Avoid QTc prolonging agents Optimize magnesium and potassium levels Monitor on telemetry   Time: 75 minutes   DVT prophylaxis: Subcu Lovenox daily  Code Status: Full code  Family Communication: Wife at bedside  Disposition Plan: Admitted to telemetry medical unit  Consults called:  Neurology.  Admission status: Observation status   Status is: Observation    Darlin Drop MD [facility] Pager [phone]  If 7PM-7AM, please contact night-coverage [email or web address] Password [facility]  07/24/2023, 9:44 PM

## 2023-07-24 NOTE — ED Notes (Signed)
Blood taken to lab.

## 2023-07-24 NOTE — Consult Note (Addendum)
 NEUROLOGY CONSULT NOTE   Date of service: July 24, 2023 Patient Name: Zachary Underwood MRN:  [id number] DOB:  07-01-1927 Chief Complaint: "Slurred speech tingling numbness" Requesting Provider: Clydie Braun, MD  History of Present Illness  Zachary Underwood is a 89 y.o. male with hx of atrial fibrillation on Xarelto, prior left hemispheric infarct likely cardioembolic, ruptured aortic aneurysm s/p repair, hypertension, arthritis who has been experiencing trouble with both his knees and has had falls due to knee pain, was noted to be feeling dizzy since some point yesterday evening-last known well unclear and had tingling and numbness in both his fingers as well as slurred speech.  Wife reports that initial thought was that his blood pressures might have been too high from the pain that he is experiencing due to his knee issues.  His slurred speech remained persistent for a while and the dizziness that he complained of was something new. They came in for evaluation to the ER, case was discussed with the on-call neurologist during daytime who recommended admission for stroke/TIA evaluation. No report of excessively high blood pressures per the family.  Compliant to Xarelto which she takes every night  Noted to be bradycardic in the outside ER  LKW: Sometime in the evening of 07/23/2023-exact time unclear Modified rankin score: 2-Slight disability-UNABLE to perform all activities but does not need assistance IV Thrombolysis: Outside the window, unclear last known well EVT: Clinical exam not consistent with LVO  NIHSS components Score: Comment  1a Level of Conscious 0[x]  1[]  2[]  3[]      1b LOC Questions 0[x]  1[]  2[]       1c LOC Commands 0[x]  1[]  2[]       2 Best Gaze 0[x]  1[]  2[]       3 Visual 0[x]  1[]  2[]  3[]      4 Facial Palsy 0[x]  1[]  2[]  3[]      5a Motor Arm - left 0[x]  1[]  2[]  3[]  4[]  UN[]    5b Motor Arm - Right 0[x]  1[]  2[]  3[]  4[]  UN[]    6a Motor Leg - Left 0[x]  1[]  2[]  3[]  4[]  UN[]     6b Motor Leg - Right 0[x]  1[]  2[]  3[]  4[]  UN[]    7 Limb Ataxia 0[x]  1[]  2[]  3[]  UN[]     8 Sensory 0[x]  1[]  2[]  UN[]      9 Best Language 0[x]  1[]  2[]  3[]      10 Dysarthria 0[x]  1[]  2[]  UN[]      11 Extinct. and Inattention 0[x]  1[]  2[]       TOTAL: 0      ROS  Comprehensive ROS performed and pertinent positives documented in HPI    Past History   Past Medical History:  Diagnosis Date   Arthritis    Cardiac arrhythmia    Hypertension    Ruptured aortic aneurysm (HCC)    Stroke (HCC) 08/23/2021    Past Surgical History:  Procedure Laterality Date   open heart surgery     ruptured aortic aneurysm repair      Family History: History reviewed. No pertinent family history.  Social History  reports that he has quit smoking. His smoking use included cigarettes. He has never used smokeless tobacco. He reports that he does not drink alcohol and does not use drugs.  Allergies  Allergen Reactions   Short Ragweed Pollen Ext     Other Reaction(s): other   Beef-Derived Drug Products     Patient does not eat beef    Eliquis [Apixaban]    Lasix [Furosemide]  Other Other (See Comments) and Hypertension    Fusid- Legs became very swollen   Tylenol [Acetaminophen] Hives    Medications  No current facility-administered medications for this encounter.  Vitals   Vitals:   07/28/23 1615 July 28, 2023 1643 July 28, 2023 1845 Jul 28, 2023 2028  BP: (!) 168/82  (!) 183/76 (!) 174/73  Pulse: (!) 38  (!) 58 (!) 55  Resp: (!) 21  15 18   Temp:  97.6 F (36.4 C)  97.9 F (36.6 C)  TempSrc:  Oral  Oral  SpO2: 98%  98% 99%    There is no height or weight on file to calculate BMI.  Physical Exam  General: Well-developed well-nourished elderly gentleman in no acute distress HEENT: Normocephalic atraumatic Lungs: Clear Cardiovascular: Regular rhythm Abdomen nondistended nontender Neurological exam Awake alert oriented x 3 Able to provide clear history Speech is not dysarthric No clear  evidence of aphasia Cranial nerves II to XII intact Motor examination with no drift in any of the 4 extremities-lower extremity examination of individual muscles somewhat limited by knee pain bilaterally. Sensation intact light touch No gross dysmetria  Labs/Imaging/Neurodiagnostic studies   CBC:  Recent Labs  Lab 28-Jul-2023 1242  WBC 6.9  NEUTROABS 5.6  HGB 13.7  HCT 40.9  MCV 96.7  PLT 156   Basic Metabolic Panel:  Lab Results  Component Value Date   NA 140 07/28/2023   K 3.9 Jul 28, 2023   CO2 28 07-28-2023   GLUCOSE 91 07-28-23   BUN 27 (H) July 28, 2023   CREATININE 1.20 28-Jul-2023   CALCIUM 10.3 07-28-2023   GFRNONAA 56 (L) 2023-07-28   Lipid Panel:  Lab Results  Component Value Date   LDLCALC 54 08/23/2021   HgbA1c:  Lab Results  Component Value Date   HGBA1C 5.4 08/23/2021   Urine Drug Screen:     Component Value Date/Time   LABOPIA NONE DETECTED 07/28/2023 1355   COCAINSCRNUR NONE DETECTED 28-Jul-2023 1355   LABBENZ NONE DETECTED 28-Jul-2023 1355   AMPHETMU NONE DETECTED July 28, 2023 1355   THCU NONE DETECTED July 28, 2023 1355   LABBARB NONE DETECTED Jul 28, 2023 1355    Alcohol Level     Component Value Date/Time   ETH <10 2023/07/28 1242   INR  Lab Results  Component Value Date   INR 1.6 (H) 07-28-23   APTT  Lab Results  Component Value Date   APTT 43 (H) 07-28-23   Imaging personally reviewed CT angiography head and neck including noncontrast CT head-no acute findings on the CT head.  CTA of the head and neck reveals multifocal atherosclerosis likely increases 2023.  Approximately 70% stenosis of the proximal cervical right ICA.  This was previously 50%. Approximately 70% stenosis of the proximal left cervical ICA-overall similar to prior.  Focal moderate stenosis of the right supraclinoid ICA-increased from prior. Moderate atherosclerosis of the aortic arch-prominence of the distal ascending aorta proximal arch measuring up to 4.1 cm-unchanged.   No abnormalities noted in the posterior circulation-left fetal PCA.  ASSESSMENT   Zachary Underwood is a 89 y.o. male with above past medical history presenting for evaluation of slurred speech and dizziness along with tingling and numbness in both arms.  Overall symptoms are not very concerning for a stroke or TIA but a posterior circulation TIA can present with the symptoms and given his multiple risk factors, risk factor workup should be undertaken.  Given the bradycardia, might have had an embolic event.  Impression: Evaluate for posterior circulation TIA versus stroke  RECOMMENDATIONS  Admit  to hospitalist Frequent neurochecks Telemetry Hold Xarelto for now.  If strong suspicion for TIA or MRI shows a stroke, anticoagulation should be changed-to Pradaxa.  He has failed Eliquis-has allergy to Eliquis. Aspirin 81 for today. High intensity statin for goal LDL less than 70 Check A1c-goal A1c less than 7 MRI brain without contrast 2D echo PT OT Speech therapy N.p.o. until cleared by bedside swallow evaluation Blood pressure goal: Avoid hypotension and allow for permissive hypertension.  Treat high blood pressure only if systolic is greater than 220, on a as needed basis.  After about 24 to 48 hours, start normalizing blood pressure with goal blood pressure upon discharge of normotension  Plan was relayed to Dr. Margo Aye, admitting hospitalist. Stroke team to follow Plan was also discussed with the patient's wife at bedside in detail ______________________________________________________________________    Signed, Milon Dikes, MD [facility]

## 2023-07-25 ENCOUNTER — Observation Stay (HOSPITAL_COMMUNITY)

## 2023-07-25 DIAGNOSIS — M25561 Pain in right knee: Secondary | ICD-10-CM

## 2023-07-25 DIAGNOSIS — I6523 Occlusion and stenosis of bilateral carotid arteries: Secondary | ICD-10-CM

## 2023-07-25 DIAGNOSIS — Z8673 Personal history of transient ischemic attack (TIA), and cerebral infarction without residual deficits: Secondary | ICD-10-CM

## 2023-07-25 DIAGNOSIS — E785 Hyperlipidemia, unspecified: Secondary | ICD-10-CM | POA: Diagnosis not present

## 2023-07-25 DIAGNOSIS — I4891 Unspecified atrial fibrillation: Secondary | ICD-10-CM

## 2023-07-25 DIAGNOSIS — Z9889 Other specified postprocedural states: Secondary | ICD-10-CM

## 2023-07-25 DIAGNOSIS — R471 Dysarthria and anarthria: Secondary | ICD-10-CM | POA: Diagnosis not present

## 2023-07-25 DIAGNOSIS — R001 Bradycardia, unspecified: Secondary | ICD-10-CM | POA: Diagnosis not present

## 2023-07-25 DIAGNOSIS — Z7901 Long term (current) use of anticoagulants: Secondary | ICD-10-CM

## 2023-07-25 DIAGNOSIS — Z8679 Personal history of other diseases of the circulatory system: Secondary | ICD-10-CM

## 2023-07-25 DIAGNOSIS — I10 Essential (primary) hypertension: Secondary | ICD-10-CM

## 2023-07-25 DIAGNOSIS — G459 Transient cerebral ischemic attack, unspecified: Secondary | ICD-10-CM

## 2023-07-25 DIAGNOSIS — Z95828 Presence of other vascular implants and grafts: Secondary | ICD-10-CM

## 2023-07-25 DIAGNOSIS — M25461 Effusion, right knee: Secondary | ICD-10-CM

## 2023-07-25 LAB — LIPID PANEL
Cholesterol: 107 mg/dL (ref 0–200)
HDL: 42 mg/dL (ref >40)
LDL Cholesterol: 50 mg/dL (ref 0–99)
Total CHOL/HDL Ratio: 2.5 ratio
Triglycerides: 74 mg/dL (ref <150)
VLDL: 15 mg/dL (ref 0–40)

## 2023-07-25 LAB — BASIC METABOLIC PANEL WITH GFR
Anion gap: 5 (ref 5–15)
BUN: 25 mg/dL — ABNORMAL HIGH (ref 8–23)
CO2: 27 mmol/L (ref 22–32)
Calcium: 9.8 mg/dL (ref 8.9–10.3)
Chloride: 107 mmol/L (ref 98–111)
Creatinine, Ser: 1.03 mg/dL (ref 0.61–1.24)
GFR, Estimated: >60 mL/min (ref >60)
Glucose, Bld: 89 mg/dL (ref 70–99)
Potassium: 3.7 mmol/L (ref 3.5–5.1)
Sodium: 139 mmol/L (ref 135–145)

## 2023-07-25 LAB — ECHOCARDIOGRAM LIMITED
Est EF: >75
Height: 68 in
S' Lateral: 1.7 cm
Weight: 2960 [oz_av]

## 2023-07-25 LAB — MAGNESIUM: Magnesium: 1.8 mg/dL (ref 1.7–2.4)

## 2023-07-25 LAB — HEMOGLOBIN A1C
Hgb A1c MFr Bld: 4.9 % (ref 4.8–5.6)
Mean Plasma Glucose: 93.93 mg/dL

## 2023-07-25 MED ORDER — ATORVASTATIN CALCIUM 10 MG PO TABS
5.0000 mg | ORAL_TABLET | Freq: Every day | ORAL | Status: DC
  Administered 2023-07-25 – 2023-07-28 (×4): 5 mg via ORAL
  Filled 2023-07-25 (×4): qty 1

## 2023-07-25 MED ORDER — RIVAROXABAN 15 MG PO TABS
15.0000 mg | ORAL_TABLET | Freq: Every day | ORAL | Status: DC
  Administered 2023-07-25 – 2023-07-28 (×4): 15 mg via ORAL
  Filled 2023-07-25 (×4): qty 1

## 2023-07-25 MED ORDER — ENOXAPARIN SODIUM 40 MG/0.4ML IJ SOSY
40.0000 mg | PREFILLED_SYRINGE | INTRAMUSCULAR | Status: DC
Start: 2023-07-25 — End: 2023-07-25
  Administered 2023-07-25: 40 mg via SUBCUTANEOUS
  Filled 2023-07-25: qty 0.4

## 2023-07-25 NOTE — Evaluation (Signed)
 Occupational Therapy Evaluation Patient Details Name: Zachary Underwood MRN: [id number] DOB: May 04, 1927 Today's Date: 07/25/2023   History of Present Illness   Pt is a 89 y/o male presenting on 07/24/23 with slurred speech, falls, and bilat hand numbness. MRI negative for acute abnormalities, suspect TIA. PMH includes: afib, h/o AAA s/p repair, OA, HTN, CHF, hx of CVA.     Clinical Impressions PTA pt lived with spouse and reports being mod I in mobility using RW and mostly ind with ADLs with the exception of assist for LBD and his spouse helps him with stairs. Pt currently ambulatory with CGA + RW and continues to c/o R knee pain, no sensation deficits identified upon testing. Pt completing ADLs with Max A to setup A, most assist needed with LB ADLs. OT to continue following pt acutely to progress pt as able and help transition to next level of care. Recommend pt continue with current HH routine.      If plan is discharge home, recommend the following:   Assistance with cooking/housework;A little help with bathing/dressing/bathroom     Functional Status Assessment   Patient has had a recent decline in their functional status and demonstrates the ability to make significant improvements in function in a reasonable and predictable amount of time.     Equipment Recommendations   None recommended by OT (pt has rec DME)     Recommendations for Other Services         Precautions/Restrictions   Precautions Precautions: Fall Recall of Precautions/Restrictions: Intact Restrictions Weight Bearing Restrictions Per Provider Order: No     Mobility Bed Mobility Overal bed mobility: Needs Assistance Bed Mobility: Supine to Sit     Supine to sit: Min assist     General bed mobility comments: min A to raise trunk    Transfers Overall transfer level: Needs assistance Equipment used: Rolling walker (2 wheels) Transfers: Sit to/from Stand Sit to Stand: Contact guard assist            General transfer comment: Pt ambulatory to toilet and recliner with CGA      Balance Overall balance assessment: Needs assistance Sitting-balance support: No upper extremity supported, Feet supported Sitting balance-Leahy Scale: Good       Standing balance-Leahy Scale: Fair Standing balance comment: static stands no AD                           ADL either performed or assessed with clinical judgement   ADL Overall ADL's : Needs assistance/impaired Eating/Feeding: Independent;Sitting   Grooming: Standing;Oral care;Contact guard assist   Upper Body Bathing: Set up;Sitting   Lower Body Bathing: Sitting/lateral leans;Minimal assistance   Upper Body Dressing : Sitting;Set up   Lower Body Dressing: Sitting/lateral leans;Maximal assistance   Toilet Transfer: Ambulation;Rolling walker (2 wheels);Contact guard assist   Toileting- Clothing Manipulation and Hygiene: Sit to/from stand;Contact guard assist       Functional mobility during ADLs: Contact guard assist;Rolling walker (2 wheels)       Vision Baseline Vision/History: 0 No visual deficits Ability to See in Adequate Light: 0 Adequate Patient Visual Report: No change from baseline Vision Assessment?: Yes Eye Alignment: Within Functional Limits Ocular Range of Motion: Within Functional Limits Alignment/Gaze Preference: Within Defined Limits Tracking/Visual Pursuits: Able to track stimulus in all quads without difficulty Additional Comments: Pt reading items on menu, scanning room without challenge     Perception Perception: Within Functional Limits  Praxis Praxis: [facility]       Pertinent Vitals/Pain Pain Assessment Pain Assessment: Faces Faces Pain Scale: Hurts a little bit Pain Location: R knee Pain Descriptors / Indicators: Aching, Sore Pain Intervention(s): Limited activity within patient's tolerance, Monitored during session     Extremity/Trunk Assessment Upper Extremity  Assessment Upper Extremity Assessment: Overall WFL for tasks assessed   Lower Extremity Assessment Lower Extremity Assessment: Defer to PT evaluation;RLE deficits/detail RLE Deficits / Details: INcreased edema along R calf   Cervical / Trunk Assessment Cervical / Trunk Assessment: Kyphotic   Communication Communication Communication: Impaired Factors Affecting Communication: Hearing impaired   Cognition Arousal: Alert Behavior During Therapy: WFL for tasks assessed/performed Cognition: No apparent impairments, Difficult to assess Difficult to assess due to: Hard of hearing/deaf           OT - Cognition Comments: Pt HOH even with hearing aides.                 Following commands: Intact       Cueing  General Comments   Cueing Techniques: Verbal cues;Gestural cues      Exercises     Shoulder Instructions      Home Living Family/patient expects to be discharged to:: Private residence Living Arrangements: Spouse/significant other Available Help at Discharge: Family;Available 24 hours/day Type of Home: House Home Access: Stairs to enter [employer] of Steps: 3 steps Entrance Stairs-Rails: Left;Right;Can reach both Home Layout: Bed/bath upstairs     Bathroom Shower/Tub: Chief Strategy Officer: Standard     Home Equipment: Agricultural consultant (2 wheels);Cane - quad;Shower seat;BSC/3in1;Rollator (4 wheels)   Additional Comments: Has home health OT 1x/wk, and HHPT HHSLP. Sleeping on safas since fall in march  Lives With: Spouse    Prior Functioning/Environment Prior Level of Function : Needs assist;History of Falls (last six months)             Mobility Comments: Amb using quad cane, wife assists with stairs. Has been using RW with onset of R knee pain ADLs Comments: WIfe assists wtih LBD at times, pt overall ind    OT Problem List: Pain;Impaired balance (sitting and/or standing)   OT Treatment/Interventions: Self-care/ADL  training;Balance training;Therapeutic exercise;Therapeutic activities;Patient/family education      OT Goals(Current goals can be found in the care plan section)   Acute Rehab OT Goals Patient Stated Goal: To go home OT Goal Formulation: With patient Time For Goal Achievement: 08/08/23 Potential to Achieve Goals: Good ADL Goals Pt Will Perform Grooming: with modified independence;standing Pt Will Perform Lower Body Bathing: with modified independence;sitting/lateral leans Pt Will Perform Lower Body Dressing: with modified independence;sit to/from stand Pt Will Transfer to Toilet: with modified independence;ambulating Pt Will Perform Tub/Shower Transfer: with modified independence;Tub transfer;shower seat   OT Frequency:  Min 2X/week    Co-evaluation              AM-PAC OT "6 Clicks" Daily Activity     Outcome Measure Help from another person eating meals?: None Help from another person taking care of personal grooming?: A Little Help from another person toileting, which includes using toliet, bedpan, or urinal?: A Little Help from another person bathing (including washing, rinsing, drying)?: A Little Help from another person to put on and taking off regular upper body clothing?: A Little Help from another person to put on and taking off regular lower body clothing?: A Lot 6 Click Score: 18   End of Session Equipment Utilized During Treatment: Gait belt;Rolling  walker (2 wheels) Nurse Communication: Mobility status  Activity Tolerance: Patient tolerated treatment well Patient left: in chair;with call bell/phone within reach;with chair alarm set  OT Visit Diagnosis: History of falling (Z91.81);Unsteadiness on feet (R26.81);Pain Pain - Right/Left: Right Pain - part of body: Knee                Time: 0830-0908 OT Time Calculation (min): 38 min Charges:  OT General Charges $OT Visit: 1 Visit OT Evaluation $OT Eval Low Complexity: 1 Low OT Treatments $Self Care/Home  Management : 8-22 mins $Therapeutic Activity: 8-22 mins  07/25/2023  AB, OTR/L  Acute Rehabilitation Services  Office: [phone]   Tristan Schroeder 07/25/2023, 10:00 AM

## 2023-07-25 NOTE — Progress Notes (Signed)
  Echocardiogram 2D Echocardiogram has been performed.  Leda Roys RDCS 07/25/2023, 1:38 PM

## 2023-07-25 NOTE — Evaluation (Signed)
 Speech Language Pathology Evaluation Patient Details Name: Zachary Underwood MRN: [id number] DOB: 03/13/1928 Today's Date: 07/25/2023 Time: 7253-6644 SLP Time Calculation (min) (ACUTE ONLY): 14 min  Problem List:  Patient Active Problem List   Diagnosis Date Noted   TIA (transient ischemic attack) 07/24/2023   Syncope and collapse 01/28/2023   Benign enlargement of prostate 07/30/2022   Bilateral hearing loss 07/30/2022   Other abnormalities of gait and mobility 07/30/2022   Other specified problems related to psychosocial circumstances 07/30/2022   Sensorineural hearing loss, bilateral 07/30/2022   Tinnitus, bilateral 07/30/2022   Impingement syndrome of left shoulder region 06/03/2022   Mild cognitive impairment of uncertain or unknown etiology 03/27/2022   Snores 03/27/2022   Encounter for examination for normal comparison and control in clinical research program 03/27/2022   Hyperlipidemia 02/28/2022   Essential hypertension 02/28/2022   Aortic stenosis, moderate 02/28/2022   Stroke (HCC) 08/23/2021   Atherosclerosis of aorta (HCC) 08/23/2021   Atrial fibrillation (HCC)    Past Medical History:  Past Medical History:  Diagnosis Date   Arthritis    Cardiac arrhythmia    Hypertension    Ruptured aortic aneurysm (HCC)    Stroke (HCC) 08/23/2021   Past Surgical History:  Past Surgical History:  Procedure Laterality Date   open heart surgery     ruptured aortic aneurysm repair     HPI:  Zachary Underwood is a 89 y.o. 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 CTA  head and neck with and without contrast revealed no large vessel occlusion and no CT evidence of acute intracranial abnormality. MRI no acute intracranial abnormality. 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  Approximately 70% stenosis of the proximal left cervical ICA, overall similar to prior. Had SLE 2023 and scored 28/30, no further ST was recommended   Assessment / Plan / Recommendation Clinical Impression  Pt's dysarthria has resolved and pt in agreement. He denies deficits in cognition with this hospitalization but does note that he is receiving home health ST and working on "puzzles".  No oromotor weakness appreciated. Was given the SLUMS and received a score of 26/30 with 27/30 considered to be within normal limits. The only points he missed was on word recall where he recalled 1/5 words but able to state accurately given semantic cues indicative of difficulty with retrieval. Pt states he uses his calendar and carries appointment cards with him to recall important information. Suspect he is using these strategies to compensate for memory impairments.  Language abilities were within functional limits. No further ST is needed at this time and will defer to pt's home healh SLP for continued therapy.    SLP Assessment  SLP Recommendation/Assessment: Patient does not need any further Speech Lanaguage Pathology Services SLP Visit Diagnosis: Cognitive communication deficit (R41.841)    Recommendations for follow up therapy are one component of a multi-disciplinary discharge planning process, led by the attending physician.  Recommendations may be updated based on patient status, additional functional criteria and insurance authorization.    Follow Up Recommendations  No SLP follow up  Assistance Recommended at Discharge  Set up Supervision/Assistance  Functional Status Assessment Patient has not had a recent decline in  their functional status  Frequency and Duration           SLP Evaluation Cognition  Overall Cognitive Status: Impaired/Different from baseline (for memory subtest only) Arousal/Alertness: Awake/alert Orientation Level: Oriented X4 Year: 2025 Day of Week: Correct Attention: Sustained Sustained Attention: Appears intact Memory: Impaired (recalled 1/5 words independently) Memory Impairment: Retrieval deficit Awareness: Appears intact Problem Solving: Appears intact Safety/Judgment: Appears intact       Comprehension  Auditory Comprehension Overall Auditory Comprehension: Appears within functional limits for tasks assessed Visual Recognition/Discrimination Discrimination: Not tested Reading Comprehension Reading Status: Not tested    Expression Expression Primary Mode of Expression: Verbal Verbal Expression Overall Verbal Expression: Appears within functional limits for tasks assessed Initiation: No impairment Level of Generative/Spontaneous Verbalization: Conversation Repetition:  (NT) Naming: No impairment Pragmatics: No impairment Written Expression Written Expression: Not tested   Oral / Motor  Oral Motor/Sensory Function Overall Oral Motor/Sensory Function: Within functional limits Motor Speech Overall Motor Speech: Appears within functional limits for tasks assessed Respiration: Within functional limits Phonation: Normal Resonance: Within functional limits Articulation: Within functional limitis Intelligibility: Intelligible Motor Planning: Witnin functional limits Motor Speech Errors: Not applicable            Royce Macadamia 07/25/2023, 9:58 AM

## 2023-07-25 NOTE — Progress Notes (Addendum)
 STROKE TEAM PROGRESS NOTE   INTERIM HISTORY/SUBJECTIVE  Patient has remained hemodynamically stable and afebrile overnight, and his neurological exam is within normal limits.  He is only complaint at the moment is right knee pain.  OBJECTIVE  CBC    Component Value Date/Time   WBC 6.9 07/24/2023 1242   RBC 4.23 07/24/2023 1242   HGB 13.7 07/24/2023 1242   HCT 40.9 07/24/2023 1242   PLT 156 07/24/2023 1242   MCV 96.7 07/24/2023 1242   MCH 32.4 07/24/2023 1242   MCHC 33.5 07/24/2023 1242   RDW 13.9 07/24/2023 1242   LYMPHSABS 0.7 07/24/2023 1242   MONOABS 0.5 07/24/2023 1242   EOSABS 0.2 07/24/2023 1242   BASOSABS 0.1 07/24/2023 1242    BMET    Component Value Date/Time   NA 139 07/25/2023 0657   K 3.7 07/25/2023 0657   CL 107 07/25/2023 0657   CO2 27 07/25/2023 0657   GLUCOSE 89 07/25/2023 0657   BUN 25 (H) 07/25/2023 0657   CREATININE 1.03 07/25/2023 0657   CALCIUM 9.8 07/25/2023 0657   GFRNONAA >60 07/25/2023 0657    IMAGING past 24 hours MR BRAIN WO CONTRAST Result Date: 07/25/2023 CLINICAL DATA:  Transient ischemic attack EXAM: MRI HEAD WITHOUT CONTRAST TECHNIQUE: Multiplanar, multiecho pulse sequences of the brain and surrounding structures were obtained without intravenous contrast. COMPARISON:  08/22/2021 FINDINGS: Brain: No acute infarct, mass effect or extra-axial collection. No acute or chronic hemorrhage. Normal white matter signal, parenchymal volume and CSF spaces. The midline structures are normal. Old left cerebellar infarct. Vascular: Normal flow voids. Skull and upper cervical spine: Normal calvarium and skull base. Visualized upper cervical spine and soft tissues are normal. Sinuses/Orbits:No paranasal sinus fluid levels or advanced mucosal thickening. No mastoid or middle ear effusion. Normal orbits. IMPRESSION: 1. No acute intracranial abnormality. 2. Old left cerebellar infarct. Electronically Signed   By: Deatra Robinson M.D.   On: 07/25/2023 00:02   CT  Angio Head Neck W WO CM Result Date: 07/24/2023 CLINICAL DATA:  Transient ischemic attack, dizziness and slurred speech around 8 a.m. this morning. EXAM: CT ANGIOGRAPHY HEAD AND NECK WITH AND WITHOUT CONTRAST TECHNIQUE: Multidetector CT imaging of the head and neck was performed using the standard protocol during bolus administration of intravenous contrast. Multiplanar CT image reconstructions and MIPs were obtained to evaluate the vascular anatomy. Carotid stenosis measurements (when applicable) are obtained utilizing NASCET criteria, using the distal internal carotid diameter as the denominator. RADIATION DOSE REDUCTION: This exam was performed according to the departmental dose-optimization program which includes automated exposure control, adjustment of the mA and/or kV according to patient size and/or use of iterative reconstruction technique. CONTRAST:  75mL OMNIPAQUE IOHEXOL 350 MG/ML SOLN COMPARISON:  CTA head and neck 08/23/2021, MRI head 08/22/2021. FINDINGS: CT HEAD FINDINGS Brain: No acute intracranial hemorrhage. No CT evidence of acute infarct. 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 Nonspecific hypoattenuation in the periventricular and subcortical white matter favored to reflect chronic microvascular ischemic changes. No edema, mass effect, or midline shift. The basilar cisterns are patent. Ventricles: Prominence of the ventricles suggestive of underlying parenchymal volume loss. Vascular: No hyperdense vessel. Intracranial atherosclerotic calcifications noted. Skull: No acute or aggressive finding. Sinuses/orbits: The visualized paranasal sinuses are clear. Orbits are symmetric. Other: Mastoid air cells are clear. CTA NECK FINDINGS Aortic arch: Standard configuration of the aortic arch. Moderate atherosclerosis of the visualized aortic arch. The distal ascending aorta/proximal aortic arch measures of 4.1 cm in diameter. Imaged portion shows no evidence  of dissection. No significant stenosis of the  major arch vessel origins. Pulmonary arteries: As permitted by contrast timing, there are no filling defects in the visualized pulmonary arteries. Subclavian arteries: The subclavian arteries are patent bilaterally. Right carotid system: Patent from the origin to the skull base. Mild tortuosity of the proximal common carotid artery. Bulky calcified atherosclerosis at the carotid bifurcation extending into the proximal cervical ICA with increased circumferential calcification compared to the prior study. There is approximately 70% stenosis of the proximal right cervical ICA increased from prior. Left carotid system: Patent from the origin to the skull base. Mild tortuosity of the common carotid artery. Bulky calcified atherosclerosis at the carotid bifurcation resulting in approximately 70% stenosis of the proximal left cervical ICA overall similar to prior. Vertebral arteries: Right vertebral artery is dominant. No evidence of dissection, stenosis (50% or greater), or occlusion. Atherosclerosis involving the bilateral vertebral artery origins resulting in mild stenosis similar to prior. Skeleton: No acute findings. Degenerative changes in the cervical spine. Trace anterolisthesis of C5 on C6. Sequelae of median sternotomy. Other neck: The visualized airway is patent. No cervical lymphadenopathy. Upper chest: Visualized lung apices are clear. Review of the MIP images confirms the above findings CTA HEAD FINDINGS ANTERIOR CIRCULATION: The intracranial ICAs are patent bilaterally. Atherosclerosis of the carotid siphons resulting in mild stenosis. There is additional moderate stenosis of the right supraclinoid ICA which appears slightly increased from prior. No significant stenosis, proximal occlusion, aneurysm, or vascular malformation. MCAs: The middle cerebral arteries are patent bilaterally. ACAs: The anterior cerebral arteries are patent bilaterally. POSTERIOR CIRCULATION: No significant stenosis, proximal  occlusion, aneurysm, or vascular malformation. PCAs: Patent bilaterally.  Fetal origin of the left PCA. Pcomm: Visualized on the left. SCAs: The superior cerebellar arteries are patent bilaterally. Basilar artery: Patent AICAs: Patent PICAs: Patent Vertebral arteries: Patent bilaterally. Atherosclerosis of the right V4 segment resulting in mild stenosis. Venous sinuses: As permitted by contrast timing, patent. Anatomic variants: Fetal origin of the left PCA. Review of the MIP images confirms the above findings IMPRESSION: No large vessel occlusion. No CT evidence of acute intracranial abnormality. Multifocal atherosclerosis as above, slightly increased since 2023. 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 Approximately 70% stenosis of the proximal left cervical ICA, overall similar to prior. Focal moderate stenosis of the right supraclinoid ICA, increased from prior. Moderate atherosclerosis of the aortic arch. Prominence of the distal ascending aorta/proximal arch measuring up to 4.1 cm, similar to prior. Chronic microvascular ischemic changes. 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 Electronically Signed   By: Emily Filbert M.D.   On: 07/24/2023 15:15    Vitals:   07/24/23 2128 07/24/23 2356 07/25/23 0325 07/25/23 0742  BP:  127/63 (!) 101/56 128/73  Pulse:  (!) 57 80 (!) 46  Resp:  18 18 16   Temp:  97.7 F (36.5 C) (!) 97.5 F (36.4 C) 97.7 F (36.5 C)  TempSrc:  Oral Axillary Oral  SpO2:  96% 96% 96%  Weight: 83.9 kg     Height: 5\' 8"  (1.727 m)        PHYSICAL EXAM General:  Alert, well-nourished, well-developed patient in no acute distress Psych:  Mood and affect appropriate for situation Respiratory:  Regular, unlabored respirations on room air  NEURO:  Mental Status: AA&Ox3, patient is able to give clear and coherent history Speech/Language: speech is without dysarthria or aphasia.    Cranial Nerves:  II: PERRL. Visual fields full.  III, IV,  VI:  EOMI. Eyelids elevate symmetrically.  V: Sensation is intact to light touch and symmetrical to face.  VII: Face is symmetrical resting and smiling VIII: hearing intact to voice. IX, X: Palate elevates symmetrically. Phonation is normal.  UE:[id number] shrug 5/5. XII: tongue is midline without fasciculations. Motor: 5/5 strength to all muscle groups tested, right leg slightly weaker than left due to knee pain.  Tone: is normal and bulk is normal Sensation- Intact to light touch bilaterally. Extinction absent to light touch to DSS.   Coordination: FTN intact bilaterally Gait- deferred  Most Recent NIH 0   ASSESSMENT/PLAN  Mr. RAVEN HARMES is a 89 y.o. 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  MRI was negative for stroke, and symptoms were thought to be due to TIA.  CT angiogram revealed 70% stenosis of bilateral carotid arteries, and vascular surgery was consulted to determine if intervention would be appropriate for this patient.  NIH on Admission 0  Dizziness and slurry speech in the setting of excruciating knee pain and narcotic use, not convinced for TIA Pt had intermittent right foot and knee pain since 3/9 after working with PT. Had extensive workup ruled out DVT or fracture.  3/16 had fall due to leg pain, [facility] negative. Prescribed steroids but not working 3/23 still pain with bruising of the knee but also felt b/l hand numbness tingling 4/2 he woke up in the morning -> went to bathroom, felt vertigo, lasted a couple of minutes -> went back to bed feeling excruciating pain, and started to have slurry speech -> moved to downstairs, BP 160s which was high for him, given tramadol and pain got better -> however, still had slurry speech till afternoon ->  now resolved but still has right knee pain CT head No acute abnormality.  Chronic microvascular ischemic changes and remote infarct in left cerebellum CTA head & neck no LVO, multifocal atherosclerosis with 70% stenosis of proximal right cervical ICA and proximal left cervical ICA 70% stenosis as well.  Focal moderate stenosis right supraclinoid ICA, increased from prior MRI no acute abnormality 2D Echo EF more than 75% LDL 50 HgbA1c 4.9 VTE prophylaxis -Xarelto Xarelto (rivaroxaban) daily prior to admission, will resume Xarelto Therapy recommendations:  Home Health OT Disposition: Pending   Hx of Stroke/TIA 08/2021 admitted for 3-4 punctate left parietal infarcts.  CTA head and neck showed bilateral ICA 50 to 65% stenosis.  EF 70 to 75%.  LDL 54, A1c 5.4.  Recommend change Xarelto to Eliquis.  Bilateral carotid artery stenosis CT showed 70% stenosis of bilateral carotid arteries However, patient symptoms not convinced for stroke/TIA Vascular on board, also considered asymptomatic, continue outpatient follow-up.  If more than 80%, may consider carotid intervention.  Bradycardia History of syncope Patient heart rate overnight high 30s Follow-up with cardiology in 03/2023, reported some syncope/hypoxic episodes.  Cardiac monitoring showed permanent A-fib with multiple pauses.  Plan to refer to EP to determine if a loop recorder would be reasonable.  However, EP has not seen yet.  Patient symptoms may be possible in the setting of bradycardia with bilateral carotid stenosis. Recommend EP consult in a.m.  Atrial fibrillation Home Meds: Xarelto 15 mg daily Continue telemetry monitoring Resume Xarelto  Hypertension Home meds: Candesartan 2 mg daily Stable Maintain normotension Check orthostatic vitals since  orthostatic hypotension may explain patient's symptoms in the setting of bilateral carotid stenosis.  Hyperlipidemia Home meds: Atorvastatin 5 mg daily LDL 50, goal < 70 Continue  Lipitor 5 High intensity statin not indicated as LDL below goal Continue statin at discharge  Other Stroke Risk Factors Advanced age   Other Active Problems Knee pain - managed by primary team  Hospital day # 0  Patient seen by NP and then by MD, MD to edit note as needed. Cortney E Ernestina Columbia , MSN, AGACNP-BC [facility] See Amion for schedule and pager information 07/25/2023 1:20 PM   ATTENDING NOTE: I reviewed above note and agree with the assessment and plan. Pt was seen and examined.   Wife at the bedside, patient sitting in chair having dinner.  Neurologically intact, no focal deficit.  Still complaining right knee pain.  Wife recounted detailed HPI with me, please see above in the assessment and plan.  From what she told me, I am not convinced that patient had stroke/TIA.  However, could be associated with bradycardia or hypotension in the setting of bilateral carotid stenosis.  He did have pulses on the cardio monitoring in 03/2023 and recommended EP consult for loop recorder but not done yet.  Will recommend EP consult in a.m., as well as to check orthostatic vitals.  Now resumed Xarelto and statin.  Agree with vascular surgery that current carotid stenosis likely to be asymptomatic, and requires outpatient monitoring.  PT and OT recommend home health.  Will follow.  Discussed with Dr. Blake Divine.  For detailed assessment and plan, please refer to above as I have made changes wherever appropriate.   Marvel Plan, MD PhD Stroke Neurology 07/25/2023 6:20 PM  I discussed with Dr. Blake Divine. I spent extensive face-to-face time with the patient, more than 50% of which was spent in counseling and coordination of care, reviewing test results, images and medication, and discussing the diagnosis, treatment plan and potential prognosis. This patient's care requiresreview of multiple databases, neurological assessment, discussion with family, other specialists and medical decision making  of high complexity.      To contact Stroke Continuity provider, please refer to [email or web address]. After hours, contact General Neurology

## 2023-07-25 NOTE — Evaluation (Signed)
 Physical Therapy Evaluation Patient Details Name: Zachary Underwood MRN: [id number] DOB: 02/27/1928 Today's Date: 07/25/2023  History of Present Illness  Pt is a 89 y/o male presenting on 07/24/23 with slurred speech, falls, and bilat hand numbness. MRI negative for acute abnormalities, suspect TIA. PMH includes: afib, h/o AAA s/p repair, OA, HTN, CHF, hx of CVA.  Clinical Impression  Pt is presenting close to baseline level of functioning. Pt is Mod I for sit to stand, gait and stairs with quad cane. Pt has a RW at home if needed. Pt has assist from spouse and an aide 13 hours/week. Due to pt current functional status, home set up and available assistance at home recommending skilled physical therapy services 3x/week in order to address strength, balance and functional mobility to decrease risk for falls, injury and re-hospitalization. Pt will be discharged at this time from acute care skilled physical therapy services; please re-consult if further needs arise.           If plan is discharge home, recommend the following: Assistance with cooking/housework;Assist for transportation     Equipment Recommendations None recommended by PT     Functional Status Assessment Patient has had a recent decline in their functional status and/or demonstrates limited ability to make significant improvements in function in a reasonable and predictable amount of time     Precautions / Restrictions Precautions Precautions: Fall Recall of Precautions/Restrictions: Intact Restrictions Weight Bearing Restrictions Per Provider Order: No      Mobility  Bed Mobility     General bed mobility comments: Pt in recliner at beginning and end of session    Transfers Overall transfer level: Modified independent Equipment used: Quad cane Transfers: Sit to/from Stand Sit to Stand: Modified independent (Device/Increase time)    Ambulation/Gait Ambulation/Gait assistance: Modified independent (Device/Increase  time) Gait Distance (Feet): 200 Feet Assistive device: Quad cane Gait Pattern/deviations: Step-through pattern, Decreased stance time - left Gait velocity: decreased Gait velocity interpretation: 1.31 - 2.62 ft/sec, indicative of limited community ambulator      Stairs Stairs: Yes Stairs assistance: Modified independent (Device/Increase time) Stair Management: Alternating pattern, Step to pattern, Forwards, One rail Left Number of Stairs: 4 General stair comments: per home set up with alternating ascending and step to gait pattern descending.     Balance Overall balance assessment: Mild deficits observed, not formally tested Sitting-balance support: No upper extremity supported, Feet supported Sitting balance-Leahy Scale: Good       Standing balance-Leahy Scale: Fair Standing balance comment: no overt LOB         Pertinent Vitals/Pain Pain Assessment Pain Assessment: No/denies pain    Home Living Family/patient expects to be discharged to:: Private residence Living Arrangements: Spouse/significant other Available Help at Discharge: Family;Available 24 hours/day Type of Home: House Home Access: Stairs to enter Entrance Stairs-Rails: Left;Right;Can reach both Entrance Stairs-Number of Steps: 3 steps   Home Layout: Bed/bath upstairs Home Equipment: Agricultural consultant (2 wheels);Cane - quad;Shower seat;BSC/3in1;Rollator (4 wheels) Additional Comments: Has home health OT 1x/wk, and HHPT HHSLP. Sleeping on safas since fall in march    Prior Function Prior Level of Function : Needs assist;History of Falls (last six months)             Mobility Comments: Amb using quad cane, wife assists with stairs. Has been using RW with onset of R knee pain ADLs Comments: WIfe assists wtih LBD at times, pt overall ind     Extremity/Trunk Assessment   Upper Extremity Assessment Upper Extremity Assessment:  Defer to OT evaluation    Lower Extremity Assessment Lower Extremity  Assessment: Overall WFL for tasks assessed RLE Deficits / Details: Increased edema along R calf    Cervical / Trunk Assessment Cervical / Trunk Assessment: Kyphotic  Communication   Communication Communication: Impaired Factors Affecting Communication: Hearing impaired    Cognition Arousal: Alert Behavior During Therapy: WFL for tasks assessed/performed     Following commands: Intact       Cueing Cueing Techniques: Verbal cues     General Comments General comments (skin integrity, edema, etc.): No noted skin issues outsid eof gown. No signs/symptoms of cardiac/respiratory distress with activity. Orthostatics per MD Supine; 136/47, Sitting 138/71  Standing 142/66 standing after 3 min 148/63        Assessment/Plan    PT Assessment All further PT needs can be met in the next venue of care  PT Problem List Decreased balance;Decreased strength       PT Treatment Interventions      PT Goals (Current goals can be found in the Care Plan section)  Acute Rehab PT Goals PT Goal Formulation: All assessment and education complete, DC therapy            AM-PAC PT "6 Clicks" Mobility  Outcome Measure Help needed turning from your back to your side while in a flat bed without using bedrails?: A Little Help needed moving from lying on your back to sitting on the side of a flat bed without using bedrails?: A Little Help needed moving to and from a bed to a chair (including a wheelchair)?: None Help needed standing up from a chair using your arms (e.g., wheelchair or bedside chair)?: None Help needed to walk in hospital room?: None Help needed climbing 3-5 steps with a railing? : None 6 Click Score: 22    End of Session Equipment Utilized During Treatment: Gait belt Activity Tolerance: Patient tolerated treatment well Patient left: in chair;with call bell/phone within reach;with chair alarm set Nurse Communication: Mobility status PT Visit Diagnosis: Unsteadiness on feet  (R26.81);Other abnormalities of gait and mobility (R26.89)    Time: 1610-9604 PT Time Calculation (min) (ACUTE ONLY): 33 min   Charges:   PT Evaluation $PT Eval Low Complexity: 1 Low PT Treatments $Therapeutic Activity: 8-22 mins PT General Charges $$ ACUTE PT VISIT: 1 Visit         Harrel Carina, DPT, CLT  Acute Rehabilitation Services Office: [phone] (Secure chat preferred)   Claudia Desanctis 07/25/2023, 4:52 PM

## 2023-07-25 NOTE — TOC Initial Note (Addendum)
 Transition of Care [facility]) - Initial/Assessment Note    Patient Details  Name: Zachary Underwood MRN: [id number] Date of Birth: 02-03-1928  Transition of Care [facility]) CM/SW Contact:    Kermit Balo, RN Phone Number: 07/25/2023, 12:02 PM  Clinical Narrative:                  Pt is from home with his spouse and he has a caregiver through the [place] for 13 hours a week. They usually split this between 2 days a week.  Caregiver or his spouse provides needed transportation. Pt manages his own medications and denies any issues.  Pt is active with [facility] for home health PT/OT/ST. CM will have this re-ordered for home at d/c.  TOC following.  [facility]: Dr Terrilee Files SWFernand Parkins [phone] ext [phone] [facility] aware [facility]  Expected Discharge Plan: Home w Home Health Services Barriers to Discharge: Continued Medical Work up   Patient Goals and CMS Choice   CMS Medicare.gov Compare Post Acute Care list provided to:: Patient Choice offered to / list presented to : Patient      Expected Discharge Plan and Services   Discharge Planning Services: CM Consult Post Acute Care Choice: Home Health Living arrangements for the past 2 months: Single Family Home                           HH Arranged: PT, Speech Therapy, OT HH Agency: [facility] Date HH Agency Contacted: 07/25/23   Representative spoke with at [facility] Agency: Adele Dan  Prior Living Arrangements/Services Living arrangements for the past 2 months: Single Family Home Lives with:: Spouse Patient language and need for interpreter reviewed:: Yes Do you feel safe going back to the place where you live?: Yes        Care giver support system in place?: Yes (comment) Current home services: DME, Home OT, Home PT (shower seat/ BSC/ cane/ Psychologist, educational) Criminal Activity/Legal Involvement Pertinent to Current Situation/Hospitalization: No - Comment as needed  Activities of Daily Living   ADL  Screening (condition at time of admission) Independently performs ADLs?: Yes (appropriate for developmental age) Is the patient deaf or have difficulty hearing?: Yes (wears bilateral hearing aids) Does the patient have difficulty seeing, even when wearing glasses/contacts?: No Does the patient have difficulty concentrating, remembering, or making decisions?: No  Permission Sought/Granted                  Emotional Assessment Appearance:: Appears stated age Attitude/Demeanor/Rapport: Engaged Affect (typically observed): Accepting Orientation: : Oriented to Self, Oriented to Place, Oriented to  Time, Oriented to Situation   Psych Involvement: No (comment)  Admission diagnosis:  TIA (transient ischemic attack) [G45.9] Patient Active Problem List   Diagnosis Date Noted   TIA (transient ischemic attack) 07/24/2023   Syncope and collapse 01/28/2023   Benign enlargement of prostate 07/30/2022   Bilateral hearing loss 07/30/2022   Other abnormalities of gait and mobility 07/30/2022   Other specified problems related to psychosocial circumstances 07/30/2022   Sensorineural hearing loss, bilateral 07/30/2022   Tinnitus, bilateral 07/30/2022   Impingement syndrome of left shoulder region 06/03/2022   Mild cognitive impairment of uncertain or unknown etiology 03/27/2022   Snores 03/27/2022   Encounter for examination for normal comparison and control in clinical research program 03/27/2022   Hyperlipidemia 02/28/2022   Essential hypertension 02/28/2022   Aortic stenosis, moderate 02/28/2022   Stroke (HCC) 08/23/2021  Atherosclerosis of aorta (HCC) 08/23/2021   Atrial fibrillation (HCC)    PCP:  Estevan Oaks, NP Pharmacy:   [facility] [place], [place] - [address] [place] [address] Phone: [phone] Fax: [phone]     Social Drivers of Health (SDOH) Social History: SDOH Screenings   Food Insecurity: No Food Insecurity  (07/24/2023)  Housing: Low Risk  (07/24/2023)  Transportation Needs: Unmet Transportation Needs (07/24/2023)  Utilities: Not At Risk (07/24/2023)  Depression (PHQ2-9): Low Risk  (09/04/2021)  Financial Resource Strain: Low Risk  (09/04/2021)  Social Connections: Unknown (07/24/2023)  Stress: No Stress Concern Present (09/04/2021)  Tobacco Use: Medium Risk (07/24/2023)   SDOH Interventions:     Readmission Risk Interventions     No data to display

## 2023-07-25 NOTE — Consult Note (Addendum)
 Hospital Consult    Reason for Consult:  bilateral ICA stenosis Requesting Physician:  Ernestina Columbia, NP MRN #:  [id number]  History of Present Illness: This is a 89 y.o. male who presented to the hospital with right knee pain and was also found to have slurred speech.    Pt's wife at bedside.  She gives most of the history.  She states that pt was having some right knee pain.  He had taken Robaxin and was working with PT.  He did have a fall and has bruising on the left knee and toes on the left.  Pt's wife feels that his right knee pain causes his blood pressure to increase.   Pt states he has not had any one sided weakness or numbness but did get some bilateral stiffness in his hands.  He does not feel he has arthritis.    Pt did have carotid duplex in October that revealed 1-39% bilateral ICA stenosis.  He had CTA here yesterday that revealed bilateral 70% ICA stenosis.  MRI revealed no acute abnormality.  Pt has hx of ascending rupture aneurysm repair in [place] about 15 years ago.  Pt is on Xarelto for Afib.    Per the chart, pt was found to have resting HR yesterday in the 40's-50's with some in the 30's and his BP remained stable.  It is felt that bradycardial could have contributed to the TIA.     The pt is on a statin for cholesterol management.  The pt is not on a daily aspirin.   Other AC:  Xarelto The pt is  on medication for hypertension.   The pt is not on medication for diabetes PTA. Tobacco hx:  former  Past Medical History:  Diagnosis Date   Arthritis    Cardiac arrhythmia    Hypertension    Ruptured aortic aneurysm (HCC)    Stroke (HCC) 08/23/2021    Past Surgical History:  Procedure Laterality Date   open heart surgery     ruptured aortic aneurysm repair      Allergies  Allergen Reactions   Short Ragweed Pollen Ext Other (See Comments)    Unknown    Beef-Derived Drug Products Other (See Comments)    Patient does not eat beef    Eliquis [Apixaban] Itching    Lasix [Furosemide] Itching   Other Other (See Comments) and Hypertension    Fusid- Legs became very swollen   Tylenol [Acetaminophen] Hives    Prior to Admission medications   Medication Sig Start Date End Date Taking? Authorizing Provider  atorvastatin (LIPITOR) 10 MG tablet Take 5 mg by mouth at bedtime. 06/07/23  Yes [provider]  candesartan (ATACAND) 4 MG tablet Take 0.5 tablets by mouth at bedtime. 06/07/23  Yes [provider]  ferrous sulfate 325 (65 FE) MG EC tablet Take 325 mg by mouth daily.   Yes [provider]  ibuprofen (ADVIL) 200 MG tablet Take 800 mg by mouth daily as needed for mild pain (pain score 1-3) or moderate pain (pain score 4-6).   Yes [provider]  lidocaine (LIDODERM) 5 % Place 1 patch onto the skin daily. Remove & Discard patch within 12 hours or as directed by MD apply to knee   Yes [provider]  metolazone (ZAROXOLYN) 2.5 MG tablet Take 1 tablet (2.5 mg total) by mouth daily. Patient taking differently: Take 2.5 mg by mouth in the morning. 08/24/21  Yes Maury Dus, MD  Multiple Vitamins-Minerals (  MULTIVITAMIN WITH MINERALS) tablet Take 0.5 tablets by mouth daily.   Yes [provider]  Rivaroxaban (XARELTO) 15 MG TABS tablet Take 15 mg by mouth daily with supper.   Yes [provider]  tamsulosin (FLOMAX) 0.4 MG CAPS capsule Take 0.4 mg by mouth at bedtime.   Yes [provider]  traMADol (ULTRAM) 50 MG tablet Take 50 mg by mouth daily as needed for moderate pain (pain score 4-6) or severe pain (pain score 7-10). 07/04/23  Yes [provider]  HYDROcodone-acetaminophen (NORCO/VICODIN) 5-325 MG tablet Take 1 tablet by mouth 3 (three) times daily as needed. Patient not taking: Reported on 07/25/2023 07/23/23   [provider]    Social History   Socioeconomic History   Marital status: Married    Spouse name: Carollee Leitz   Number of children: 4   Years of  education: Not on file   Highest education level: Professional school degree (e.g., MD, DDS, DVM, JD)  Occupational History   Occupation: Publishing copy  Tobacco Use   Smoking status: Former    Types: Cigarettes   Smokeless tobacco: Never   Tobacco comments:    Former smoker 03/29/23  Vaping Use   Vaping status: Never Used  Substance and Sexual Activity   Alcohol use: Never   Drug use: Never   Sexual activity: Not Currently  Other Topics Concern   Not on file  Social History Narrative   Lives with wife   Job Publishing copy    Social Drivers of Health   Financial Resource Strain: Low Risk  (09/04/2021)   Overall Financial Resource Strain (CARDIA)    Difficulty of Paying Living Expenses: Not hard at all  Food Insecurity: No Food Insecurity (07/24/2023)   Hunger Vital Sign    Worried About Running Out of Food in the Last Year: Never true    Ran Out of Food in the Last Year: Never true  Transportation Needs: Unmet Transportation Needs (07/24/2023)   PRAPARE - Administrator, Civil Service (Medical): Yes    Lack of Transportation (Non-Medical): No  Physical Activity: Not on file  Stress: No Stress Concern Present (09/04/2021)   [employer] of Occupational Health - Occupational Stress Questionnaire    Feeling of Stress : Only a little  Social Connections: Unknown (07/24/2023)   Social Connection and Isolation Panel [NHANES]    Frequency of Communication with Friends and Family: Twice a week    Frequency of Social Gatherings with Friends and Family: Patient declined    Attends Religious Services: Patient declined    Database administrator or Organizations: Yes    Attends Banker Meetings: 1 to 4 times per year    Marital Status: Married  Catering manager Violence: Patient Unable To Answer (07/24/2023)   Humiliation, Afraid, Rape, and Kick questionnaire    Fear of Current or Ex-Partner: Patient unable to answer    Emotionally Abused: Patient unable to answer     Physically Abused: Patient unable to answer    Sexually Abused: Patient unable to answer     ROS: [x]  Positive   [ ]  Negative   [ ]  All sytems reviewed and are negative  Cardiac: [x]  afib [x]  bradycardia [x]  HTN   Vascular: []  pain in legs while walking []  pain in legs at rest []  pain in legs at night []  non-healing ulcers []  hx of DVT []  swelling in legs  Pulmonary: []  asthma/wheezing []  home O2  Neurologic: [x]  hx of CVA [  x] mini stroke   Hematologic: []  hx of cancer  Endocrine:   []  diabetes []  thyroid disease  GI []  GERD  GU: [x]  BPH   Psychiatric: []  anxiety []  depression  Musculoskeletal: [x]  right knee pain  Integumentary: []  rashes []  ulcers  Constitutional: []  fever  []  chills  Physical Examination  Vitals:   07/25/23 0325 07/25/23 0742  BP: (!) 101/56 128/73  Pulse: 80 (!) 46  Resp: 18 16  Temp: (!) 97.5 F (36.4 C) 97.7 F (36.5 C)  SpO2: 96% 96%   Body mass index is 28.13 kg/m.  General:  WDWN in NAD Gait: Not observed HENT: WNL, normocephalic Pulmonary: normal non-labored breathing Skin: bruising left knee and toes left foot Vascular Exam/Pulses: Unable to palpate pedal pulses Bilateral radial pulses are palpable Extremities: no ulcerations on feet Musculoskeletal: no muscle wasting or atrophy  Neurologic: A&O X 3 Psychiatric:  The pt has Normal affect.   CBC    Component Value Date/Time   WBC 6.9 07/24/2023 1242   RBC 4.23 07/24/2023 1242   HGB 13.7 07/24/2023 1242   HCT 40.9 07/24/2023 1242   PLT 156 07/24/2023 1242   MCV 96.7 07/24/2023 1242   MCH 32.4 07/24/2023 1242   MCHC 33.5 07/24/2023 1242   RDW 13.9 07/24/2023 1242   LYMPHSABS 0.7 07/24/2023 1242   MONOABS 0.5 07/24/2023 1242   EOSABS 0.2 07/24/2023 1242   BASOSABS 0.1 07/24/2023 1242    BMET    Component Value Date/Time   NA 139 07/25/2023 0657   K 3.7 07/25/2023 0657   CL 107 07/25/2023 0657   CO2 27 07/25/2023 0657   GLUCOSE 89  07/25/2023 0657   BUN 25 (H) 07/25/2023 0657   CREATININE 1.03 07/25/2023 0657   CALCIUM 9.8 07/25/2023 0657   GFRNONAA >60 07/25/2023 0657    COAGS: Lab Results  Component Value Date   INR 1.6 (H) 07/24/2023   INR 1.9 (H) 04/24/2022     Non-Invasive Vascular Imaging:   Carotid duplex 02/12/2023 1-39% bilateral ICA stenosis  CTA 07/24/2023:  IMPRESSION: No large vessel occlusion.   No CT evidence of acute intracranial abnormality.   Multifocal atherosclerosis as above, slightly increased since 2023. 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 Approximately 70% stenosis of the proximal left cervical ICA, overall similar to prior.   Focal moderate stenosis of the right supraclinoid ICA, increased from prior.   Moderate atherosclerosis of the aortic arch. Prominence of the distal ascending aorta/proximal arch measuring up to 4.1 cm, similar to prior.   Chronic microvascular ischemic changes. 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   ASSESSMENT/PLAN: This is a 89 y.o. male who presented to the hospital for right knee pain and did have slurred speech and workup for TIA   -pt found to have bilateral ICA stenosis.  Most likely asymptomatic.  It is felt by neurology that an episode of bradycardia could be reason for TIA -Dr. Lenell Antu will review CTA and then be by to evaluate pt and determine further plan  Pt with hx of ascending aortic aneurysm repair around 15 years ago in [place].  He may benefit from AAA duplex to evaluate for AAA as outpatient.  Pt was taken downstairs to MRI before I could ask if he had been evaluated.   As far as his right knee pain, he was being taken down to MRI of right knee.  Prior to their arrival, pt's wife was concerned about his right knee pain  and that was reason for admission.  Hopefully they will have some answers about this after MRI.   Doreatha Massed, PA-C [facility] [phone]   VASCULAR STAFF ADDENDUM: I have independently interviewed and examined the patient. I agree with the above.  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 TIA workup showed ~70% bilateral carotid artery stenosis on CT angiogram. Neurology evaluation last night did not have clear source for TIA symptoms. If left carotid artery was felt to be culprit for TIA symptoms by Neurology, we could consider proceeding with intervention to reduce risk of stroke. Would not recommend asymptomatic intervention at this time as he is below the typical 80% threshold used to recommend these interventions.  Will check back in tomorrow.   Rande Brunt. Lenell Antu, MD [facility] Phone Number: [phone] 07/25/2023 3:58 PM

## 2023-07-25 NOTE — Progress Notes (Signed)
 [facility]                                                                               Zachary Underwood, is a 89 y.o. male, DOB - 02/26/1928, VHQ:[id number] Admit date - 07/24/2023    Outpatient Primary MD for the patient is Manson Passey, Burman Nieves, NP  LOS - 0  days    Brief summary    Zachary Underwood is a 89 y.o. 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  Admitted for evaluation of acute stroke MRI brain without contrast is negative for acute stroke.  Echocardiogram is pending.  LDL is  A1c is  CT angiogram showed. evidence of prior left cerebellum CVA.  No LVO  Plan to resume xarelto if no interventions this admission.    Bilateral ICA stenosis 70% Vascular surgery consulted.     Right knee pain:  Worse than last week.  In the setting of fall.  Patient reports that he went to see an orthopedics and underwent intraarticular cortisol injection.    Permanent atrial fib on xarelto Rate controlled.   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    Hypertension:  Well controlled.    BPH resume home meds.  Estimated body mass index is 28.13 kg/m as calculated from the following:   Height as of this encounter: 5\' 8"  (1.727 m).   Weight as of this encounter: 83.9 kg.  Code Status: full code.  DVT Prophylaxis:  xarelto.    Level of Care: Level of care: Telemetry Medical Family  Communication: none at bedside.   Disposition Plan:     Remains inpatient appropriate:  pending further work up for right knee  Procedures:  MRI brain Echo Mri of the right knee  Consultants:   Neurology Vascular surgery.   Antimicrobials:   Anti-infectives (From admission, onward)    None        Medications  Scheduled Meds:  atorvastatin  40 mg Oral QHS   lidocaine  1 patch Transdermal Q24H   tamsulosin  0.4 mg Oral QHS   Continuous Infusions:  lactated ringers 50 mL/hr at 07/25/23 0012   PRN Meds:.labetalol, melatonin, oxyCODONE, polyethylene glycol, prochlorperazine    Subjective:   Zachary Underwood was seen and examined today.  Persistent right knee pain .   Objective:   Vitals:   07/24/23 2128 07/24/23 2356 07/25/23 0325 07/25/23 0742  BP:  127/63 (!) 101/56 128/73  Pulse:  (!) 57 80 (!) 46  Resp:  18 18 16   Temp:  97.7 F (36.5 C) (!) 97.5 F (36.4 C) 97.7 F (36.5 C)  TempSrc:  Oral Axillary  Oral  SpO2:  96% 96% 96%  Weight: 83.9 kg     Height: 5\' 8"  (1.727 m)       Intake/Output Summary (Last 24 hours) at 07/25/2023 1540 Last data filed at 07/25/2023 1400 Gross per 24 hour  Intake 714.76 ml  Output --  Net 714.76 ml   Filed Weights   07/24/23 2128  Weight: 83.9 kg     Exam General: Alert and oriented x 3, NAD Cardiovascular: S1 S2 auscultated, no murmurs, RRR Respiratory: Clear to auscultation bilaterally, no wheezing, rales or rhonchi Gastrointestinal: Soft, nontender, nondistended, + bowel sounds Ext: right knee swelling and tenderness.  Neuro: AAOx3, Cr N's II- XII. Strength 5/5 upper and lower extremities bilaterally Skin: No rashes Psych: Normal affect and demeanor, alert and oriented x3    Data Reviewed:  I have personally reviewed following labs and imaging studies   CBC Lab Results  Component Value Date   WBC 6.9 07/24/2023   RBC 4.23 07/24/2023   HGB 13.7 07/24/2023   HCT 40.9 07/24/2023   MCV 96.7 07/24/2023   MCH  32.4 07/24/2023   PLT 156 07/24/2023   MCHC 33.5 07/24/2023   RDW 13.9 07/24/2023   LYMPHSABS 0.7 07/24/2023   MONOABS 0.5 07/24/2023   EOSABS 0.2 07/24/2023   BASOSABS 0.1 07/24/2023     Last metabolic panel Lab Results  Component Value Date   NA 139 07/25/2023   K 3.7 07/25/2023   CL 107 07/25/2023   CO2 27 07/25/2023   BUN 25 (H) 07/25/2023   CREATININE 1.03 07/25/2023   GLUCOSE 89 07/25/2023   GFRNONAA >60 07/25/2023   CALCIUM 9.8 07/25/2023   PROT 7.0 07/24/2023   ALBUMIN 4.3 07/24/2023   BILITOT 1.0 07/24/2023   ALKPHOS 98 07/24/2023   AST 19 07/24/2023   ALT 14 07/24/2023   ANIONGAP 5 07/25/2023    CBG (last 3)  Recent Labs    07/24/23 1226  GLUCAP 109*      Coagulation Profile: Recent Labs  Lab 07/24/23 1242  INR 1.6*     Radiology Studies: MR BRAIN WO CONTRAST Result Date: 07/25/2023 CLINICAL DATA:  Transient ischemic attack EXAM: MRI HEAD WITHOUT CONTRAST TECHNIQUE: Multiplanar, multiecho pulse sequences of the brain and surrounding structures were obtained without intravenous contrast. COMPARISON:  08/22/2021 FINDINGS: Brain: No acute infarct, mass effect or extra-axial collection. No acute or chronic hemorrhage. Normal white matter signal, parenchymal volume and CSF spaces. The midline structures are normal. Old left cerebellar infarct. Vascular: Normal flow voids. Skull and upper cervical spine: Normal calvarium and skull base. Visualized upper cervical spine and soft tissues are normal. Sinuses/Orbits:No paranasal sinus fluid levels or advanced mucosal thickening. No mastoid or middle ear effusion. Normal orbits. IMPRESSION: 1. No acute intracranial abnormality. 2. Old left cerebellar infarct. Electronically Signed   By: Deatra Robinson M.D.   On: 07/25/2023 00:02   CT Angio Head Neck W WO CM Result Date: 07/24/2023 CLINICAL DATA:  Transient ischemic attack, dizziness and slurred speech around 8 a.m. this morning. EXAM: CT ANGIOGRAPHY HEAD AND NECK WITH  AND WITHOUT CONTRAST TECHNIQUE: Multidetector CT imaging of the head and neck was performed using the standard protocol during bolus administration of intravenous contrast. Multiplanar CT image reconstructions and MIPs were obtained to evaluate the vascular anatomy. Carotid stenosis measurements (when applicable) are obtained utilizing NASCET criteria, using the distal internal carotid diameter as the denominator. RADIATION DOSE REDUCTION: This exam was performed according to the departmental dose-optimization program which includes automated  exposure control, adjustment of the mA and/or kV according to patient size and/or use of iterative reconstruction technique. CONTRAST:  75mL OMNIPAQUE IOHEXOL 350 MG/ML SOLN COMPARISON:  CTA head and neck 08/23/2021, MRI head 08/22/2021. FINDINGS: CT HEAD FINDINGS Brain: No acute intracranial hemorrhage. No CT evidence of acute infarct. 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 Nonspecific hypoattenuation in the periventricular and subcortical white matter favored to reflect chronic microvascular ischemic changes. No edema, mass effect, or midline shift. The basilar cisterns are patent. Ventricles: Prominence of the ventricles suggestive of underlying parenchymal volume loss. Vascular: No hyperdense vessel. Intracranial atherosclerotic calcifications noted. Skull: No acute or aggressive finding. Sinuses/orbits: The visualized paranasal sinuses are clear. Orbits are symmetric. Other: Mastoid air cells are clear. CTA NECK FINDINGS Aortic arch: Standard configuration of the aortic arch. Moderate atherosclerosis of the visualized aortic arch. The distal ascending aorta/proximal aortic arch measures of 4.1 cm in diameter. Imaged portion shows no evidence of dissection. No significant stenosis of the major arch vessel origins. Pulmonary arteries: As permitted by contrast timing, there are no filling defects in the visualized pulmonary arteries. Subclavian arteries: The subclavian  arteries are patent bilaterally. Right carotid system: Patent from the origin to the skull base. Mild tortuosity of the proximal common carotid artery. Bulky calcified atherosclerosis at the carotid bifurcation extending into the proximal cervical ICA with increased circumferential calcification compared to the prior study. There is approximately 70% stenosis of the proximal right cervical ICA increased from prior. Left carotid system: Patent from the origin to the skull base. Mild tortuosity of the common carotid artery. Bulky calcified atherosclerosis at the carotid bifurcation resulting in approximately 70% stenosis of the proximal left cervical ICA overall similar to prior. Vertebral arteries: Right vertebral artery is dominant. No evidence of dissection, stenosis (50% or greater), or occlusion. Atherosclerosis involving the bilateral vertebral artery origins resulting in mild stenosis similar to prior. Skeleton: No acute findings. Degenerative changes in the cervical spine. Trace anterolisthesis of C5 on C6. Sequelae of median sternotomy. Other neck: The visualized airway is patent. No cervical lymphadenopathy. Upper chest: Visualized lung apices are clear. Review of the MIP images confirms the above findings CTA HEAD FINDINGS ANTERIOR CIRCULATION: The intracranial ICAs are patent bilaterally. Atherosclerosis of the carotid siphons resulting in mild stenosis. There is additional moderate stenosis of the right supraclinoid ICA which appears slightly increased from prior. No significant stenosis, proximal occlusion, aneurysm, or vascular malformation. MCAs: The middle cerebral arteries are patent bilaterally. ACAs: The anterior cerebral arteries are patent bilaterally. POSTERIOR CIRCULATION: No significant stenosis, proximal occlusion, aneurysm, or vascular malformation. PCAs: Patent bilaterally.  Fetal origin of the left PCA. Pcomm: Visualized on the left. SCAs: The superior cerebellar arteries are patent  bilaterally. Basilar artery: Patent AICAs: Patent PICAs: Patent Vertebral arteries: Patent bilaterally. Atherosclerosis of the right V4 segment resulting in mild stenosis. Venous sinuses: As permitted by contrast timing, patent. Anatomic variants: Fetal origin of the left PCA. Review of the MIP images confirms the above findings IMPRESSION: No large vessel occlusion. No CT evidence of acute intracranial abnormality. Multifocal atherosclerosis as above, slightly increased since 2023. 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 Approximately 70% stenosis of the proximal left cervical ICA, overall similar to prior. Focal moderate stenosis of the right supraclinoid ICA, increased from prior. Moderate atherosclerosis of the aortic arch. Prominence of the distal ascending aorta/proximal arch measuring up to 4.1 cm, similar to prior. Chronic microvascular ischemic changes. 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 Electronically Signed  By: Emily Filbert M.D.   On: 07/24/2023 15:15   DG Knee Right Port Result Date: 07/24/2023 CLINICAL DATA:  Right knee pain for several weeks. EXAM: PORTABLE RIGHT KNEE - 1-2 VIEW COMPARISON:  None Available. FINDINGS: No evidence of fracture, dislocation, or joint effusion. Moderate narrowing of medial joint space is noted as well as patellofemoral space. Soft tissues are unremarkable. IMPRESSION: Moderate degenerative joint disease as noted above. No acute abnormality seen. Electronically Signed   By: Lupita Raider M.D.   On: 07/24/2023 14:43       Kathlen Mody M.D. [facility] 07/25/2023, 3:40 PM  Available via Epic secure chat 7am-7pm After 7 pm, please refer to night coverage provider listed on amion.

## 2023-07-25 NOTE — Care Management Obs Status (Signed)
 MEDICARE OBSERVATION STATUS NOTIFICATION   Patient Details  Name: Zachary Underwood MRN: [id number] Date of Birth: 04/13/1928   Medicare Observation Status Notification Given:  Yes    Lockie Pares, RN 07/25/2023, 3:09 PM

## 2023-07-26 DIAGNOSIS — Z7901 Long term (current) use of anticoagulants: Secondary | ICD-10-CM | POA: Diagnosis not present

## 2023-07-26 DIAGNOSIS — I700 Atherosclerosis of aorta: Secondary | ICD-10-CM | POA: Diagnosis present

## 2023-07-26 DIAGNOSIS — I10 Essential (primary) hypertension: Secondary | ICD-10-CM | POA: Diagnosis not present

## 2023-07-26 DIAGNOSIS — I480 Paroxysmal atrial fibrillation: Secondary | ICD-10-CM | POA: Diagnosis not present

## 2023-07-26 DIAGNOSIS — M25561 Pain in right knee: Secondary | ICD-10-CM | POA: Diagnosis not present

## 2023-07-26 DIAGNOSIS — M1711 Unilateral primary osteoarthritis, right knee: Secondary | ICD-10-CM | POA: Diagnosis present

## 2023-07-26 DIAGNOSIS — S72431D Displaced fracture of medial condyle of right femur, subsequent encounter for closed fracture with routine healing: Secondary | ICD-10-CM | POA: Diagnosis not present

## 2023-07-26 DIAGNOSIS — N400 Enlarged prostate without lower urinary tract symptoms: Secondary | ICD-10-CM | POA: Diagnosis present

## 2023-07-26 DIAGNOSIS — Z87891 Personal history of nicotine dependence: Secondary | ICD-10-CM | POA: Diagnosis not present

## 2023-07-26 DIAGNOSIS — I4821 Permanent atrial fibrillation: Secondary | ICD-10-CM

## 2023-07-26 DIAGNOSIS — R471 Dysarthria and anarthria: Secondary | ICD-10-CM | POA: Diagnosis present

## 2023-07-26 DIAGNOSIS — I350 Nonrheumatic aortic (valve) stenosis: Secondary | ICD-10-CM | POA: Diagnosis present

## 2023-07-26 DIAGNOSIS — I4892 Unspecified atrial flutter: Secondary | ICD-10-CM | POA: Diagnosis present

## 2023-07-26 DIAGNOSIS — Z79899 Other long term (current) drug therapy: Secondary | ICD-10-CM | POA: Diagnosis not present

## 2023-07-26 DIAGNOSIS — Z8673 Personal history of transient ischemic attack (TIA), and cerebral infarction without residual deficits: Secondary | ICD-10-CM | POA: Diagnosis not present

## 2023-07-26 DIAGNOSIS — I110 Hypertensive heart disease with heart failure: Secondary | ICD-10-CM | POA: Diagnosis present

## 2023-07-26 DIAGNOSIS — R42 Dizziness and giddiness: Secondary | ICD-10-CM | POA: Diagnosis present

## 2023-07-26 DIAGNOSIS — W19XXXA Unspecified fall, initial encounter: Secondary | ICD-10-CM | POA: Diagnosis present

## 2023-07-26 DIAGNOSIS — R4701 Aphasia: Secondary | ICD-10-CM | POA: Diagnosis present

## 2023-07-26 DIAGNOSIS — I711 Thoracic aortic aneurysm, ruptured, unspecified: Secondary | ICD-10-CM | POA: Diagnosis present

## 2023-07-26 DIAGNOSIS — G459 Transient cerebral ischemic attack, unspecified: Secondary | ICD-10-CM | POA: Diagnosis present

## 2023-07-26 DIAGNOSIS — S8001XA Contusion of right knee, initial encounter: Secondary | ICD-10-CM | POA: Diagnosis present

## 2023-07-26 DIAGNOSIS — Z886 Allergy status to analgesic agent status: Secondary | ICD-10-CM | POA: Diagnosis not present

## 2023-07-26 DIAGNOSIS — Z888 Allergy status to other drugs, medicaments and biological substances status: Secondary | ICD-10-CM | POA: Diagnosis not present

## 2023-07-26 DIAGNOSIS — R001 Bradycardia, unspecified: Secondary | ICD-10-CM | POA: Diagnosis present

## 2023-07-26 DIAGNOSIS — S72431A Displaced fracture of medial condyle of right femur, initial encounter for closed fracture: Secondary | ICD-10-CM | POA: Diagnosis present

## 2023-07-26 DIAGNOSIS — R4781 Slurred speech: Secondary | ICD-10-CM | POA: Diagnosis present

## 2023-07-26 DIAGNOSIS — E785 Hyperlipidemia, unspecified: Secondary | ICD-10-CM | POA: Diagnosis present

## 2023-07-26 DIAGNOSIS — I5032 Chronic diastolic (congestive) heart failure: Secondary | ICD-10-CM | POA: Diagnosis present

## 2023-07-26 DIAGNOSIS — I6523 Occlusion and stenosis of bilateral carotid arteries: Secondary | ICD-10-CM | POA: Diagnosis present

## 2023-07-26 MED ORDER — ENSURE ENLIVE PO LIQD
237.0000 mL | Freq: Two times a day (BID) | ORAL | Status: DC
  Administered 2023-07-26 – 2023-07-28 (×4): 237 mL via ORAL

## 2023-07-26 MED ORDER — ENSURE ENLIVE PO LIQD: 237.0000 mL | Freq: Two times a day (BID) | ORAL | 2 refills | Status: AC

## 2023-07-26 MED ORDER — ASPIRIN 81 MG PO TBEC: 81.0000 mg | DELAYED_RELEASE_TABLET | Freq: Every day | ORAL | 11 refills | Status: DC

## 2023-07-26 NOTE — NC FL2 (Signed)
  MEDICAID FL2 LEVEL OF CARE FORM     IDENTIFICATION  Patient Name: Zachary Underwood Birthdate: [birth year] Sex: male Admission Date (Current Location): 07/24/2023  [facility] and [place] Number:  Producer, television/film/video and Address:  [facility] [place], [address], [place], [place] [address]      Provider Number: [id number]  Attending Physician Name and Address:  Kathlen Mody, MD  Relative Name and Phone Number:       Current Level of Care: Hospital Recommended Level of Care: Skilled Nursing Facility Prior Approval Number:    Date Approved/Denied:   PASRR Number: [id number]  Discharge Plan: SNF    Current Diagnoses: Patient Active Problem List   Diagnosis Date Noted   TIA (transient ischemic attack) 07/24/2023   Syncope and collapse 01/28/2023   Benign enlargement of prostate 07/30/2022   Bilateral hearing loss 07/30/2022   Other abnormalities of gait and mobility 07/30/2022   Other specified problems related to psychosocial circumstances 07/30/2022   Sensorineural hearing loss, bilateral 07/30/2022   Tinnitus, bilateral 07/30/2022   Impingement syndrome of left shoulder region 06/03/2022   Mild cognitive impairment of uncertain or unknown etiology 03/27/2022   Snores 03/27/2022   Encounter for examination for normal comparison and control in clinical research program 03/27/2022   Hyperlipidemia 02/28/2022   Essential hypertension 02/28/2022   Aortic stenosis, moderate 02/28/2022   Stroke (HCC) 08/23/2021   Atherosclerosis of aorta (HCC) 08/23/2021   Atrial fibrillation (HCC)     Orientation RESPIRATION BLADDER Height & Weight     Self, Time, Situation, Place  Normal Continent Weight: 185 lb (83.9 kg) Height:  5\' 8"  (172.7 cm)  BEHAVIORAL SYMPTOMS/MOOD NEUROLOGICAL BOWEL NUTRITION STATUS      Continent Diet (heart healthy)  AMBULATORY STATUS COMMUNICATION OF NEEDS Skin   Limited Assist Verbally Normal                        Personal Care Assistance Level of Assistance  Bathing, Feeding, Dressing Bathing Assistance: Limited assistance Feeding assistance: Independent Dressing Assistance: Limited assistance     Functional Limitations Info  Hearing   Hearing Info: Impaired (hard of hearing)      SPECIAL CARE FACTORS FREQUENCY  PT (By licensed PT), OT (By licensed OT)     PT Frequency: 5x/wk OT Frequency: 5x/wk            Contractures Contractures Info: Not present    Additional Factors Info  Code Status, Allergies Code Status Info: Full Allergies Info: Short Ragweed Pollen Ext, Beef-derived Drug Products, Eliquis (Apixaban), Lasix (Furosemide), Other, Tylenol (Acetaminophen)           Current Medications (07/26/2023):  This is the current hospital active medication list Current Facility-Administered Medications  Medication Dose Route Frequency Provider Last Rate Last Admin   atorvastatin (LIPITOR) tablet 5 mg  5 mg Oral QHS Marvel Plan, MD   5 mg at 07/25/23 2100   feeding supplement (ENSURE ENLIVE / ENSURE PLUS) liquid 237 mL  237 mL Oral BID BM Kathlen Mody, MD       labetalol (NORMODYNE) injection 5 mg  5 mg Intravenous Q2H PRN Dow Adolph N, DO       lidocaine (LIDODERM) 5 % 1 patch  1 patch Transdermal Q24H Dow Adolph N, DO   1 patch at 07/25/23 2215   melatonin tablet 5 mg  5 mg Oral QHS PRN Darlin Drop, DO  oxyCODONE (Oxy IR/ROXICODONE) immediate release tablet 5 mg  5 mg Oral Q6H PRN Dow Adolph N, DO       polyethylene glycol (MIRALAX / GLYCOLAX) packet 17 g  17 g Oral Daily PRN Dow Adolph N, DO       prochlorperazine (COMPAZINE) injection 5 mg  5 mg Intravenous Q6H PRN Hall, Carole N, DO       Rivaroxaban (XARELTO) tablet 15 mg  15 mg Oral Q supper Kathlen Mody, MD   15 mg at 07/25/23 1845   tamsulosin (FLOMAX) capsule 0.4 mg  0.4 mg Oral QHS Dow Adolph N, DO   0.4 mg at 07/25/23 2100     Discharge Medications: Please see discharge summary for a list of  discharge medications.  Relevant Imaging Results:  Relevant Lab Results:   Additional Information SS#: [id number]  Baldemar Lenis, LCSW

## 2023-07-26 NOTE — Consult Note (Signed)
 Cardiology Consultation   Patient ID: ERYX ZANE MRN: [id number]; DOB: March 19, 1928  Admit date: 07/24/2023 Date of Consult: 07/26/2023  PCP:  Estevan Oaks, NP   [facility] Providers Cardiologist:  Nanetta Batty, MD      Patient Profile:   Zachary Underwood is a 89 y.o. male with a hx of rate controlled permanent Afib and hx of CV on 15 mg xarelto, thoracic aortic rupture s/p surgical repair, moderate aortic stenosis, HTN, HLD who is being seen 07/26/2023 for the evaluation of bradycardia at the request of Dr. Blake Divine.  History of Present Illness:   Zachary Underwood establish care with Dr. Allyson Sabal in 2023.  He had just returned from [place] after spending 12 years.  He reportedly had a thoracic aortic rupture/dissection that was surgically treated.  I do not have these records.  Due to ongoing dizziness and five episodes of syncope, Dr. Allyson Sabal placed an event monitor which showed A-fib/flutter with pauses up to 4.6 sec.    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  [facility] with slurred speech concerning for acute CVA.  He was also noted to be bradycardic.  Symptoms of aphasia and dizziness improved.  He was transferred to [facility] for further care, code stroke was not initiated.  Hypertension on arrival to 151/78. Labetalol was held due to bradycardia in the mid 30s.  Neurology was consulted with strong suspicion for TIA.  Brain MRI negative for CVA.  Cardiology consulted for bradycardia. EKG with Afib and slow ventricular response.    Of note he fell prior to admission injuring both knees.  Presenting symptoms felt initially related to ongoing knee pain.  Stroke workup included CTA head and neck which showed no LVO but multifocal atherosclerosis with carotid artery stenosis.  Endovascular consulted and are planning milligram mg.  Echocardiogram was completed with an LVEF 65-70%, mildly reduced RV function, severe  LAE, moderate RAE, trivial MR, moderate aortic stenosis with a mean gradient of 27 mmHg, and aortic dilation of 45 mm.  During my interview, he reports having a particularly strenuous PT session resulting in severe leg cramps improved with a muscle relaxer. Following this on March 16 he stood up to walk across the room and felt very week in his legs and fell forward. He did not have LOC and is unsure if he tripped on a rug. He sought treatment from Dr. Charlann Boxer of ortho who was following imaging. His wife was on the phone and helps with history. He denies LOC and states that he reported to Dr. Allyson Sabal episodes of dizziness, not near-syncope. With further questioning, he describes dizziness primarily with position change.   Last evening, HR in the 30s while sleeping prompting cardiology consult. Telemetry reviewed, HR primarily in the 40s with pauses less than 3 sec.   He denies cardiac symptoms. Without prompting he reports he has a sound mind and still gives lectures by zoom. He remains active, but typically walks with a cane. He and his wife still travel.    Past Medical History:  Diagnosis Date   Arthritis    Cardiac arrhythmia    Hypertension    Ruptured aortic aneurysm [facility])    Stroke (HCC) 08/23/2021    Past Surgical History:  Procedure Laterality Date   open heart surgery     ruptured aortic aneurysm repair       Home Medications:  Prior to Admission medications  Medication Sig Start Date End Date Taking? Authorizing Provider  aspirin EC 81 MG tablet Take 1 tablet (81 mg total) by mouth daily. Swallow whole. 07/26/23 07/25/24 Yes Kathlen Mody, MD  atorvastatin (LIPITOR) 10 MG tablet Take 5 mg by mouth at bedtime. 06/07/23  Yes [provider]  candesartan (ATACAND) 4 MG tablet Take 0.5 tablets by mouth at bedtime. 06/07/23  Yes [provider]  ferrous sulfate 325 (65 FE) MG EC tablet Take 325 mg by mouth daily.   Yes [provider]  ibuprofen (ADVIL) 200 MG  tablet Take 800 mg by mouth daily as needed for mild pain (pain score 1-3) or moderate pain (pain score 4-6).   Yes [provider]  lidocaine (LIDODERM) 5 % Place 1 patch onto the skin daily. Remove & Discard patch within 12 hours or as directed by MD apply to knee   Yes [provider]  metolazone (ZAROXOLYN) 2.5 MG tablet Take 1 tablet (2.5 mg total) by mouth daily. Patient taking differently: Take 2.5 mg by mouth in the morning. 08/24/21  Yes Maury Dus, MD  Multiple Vitamins-Minerals (MULTIVITAMIN WITH MINERALS) tablet Take 0.5 tablets by mouth daily.   Yes [provider]  Rivaroxaban (XARELTO) 15 MG TABS tablet Take 15 mg by mouth daily with supper.   Yes [provider]  tamsulosin (FLOMAX) 0.4 MG CAPS capsule Take 0.4 mg by mouth at bedtime.   Yes [provider]  traMADol (ULTRAM) 50 MG tablet Take 50 mg by mouth daily as needed for moderate pain (pain score 4-6) or severe pain (pain score 7-10). 07/04/23  Yes [provider]  feeding supplement (ENSURE ENLIVE / ENSURE PLUS) LIQD Take 237 mLs by mouth 2 (two) times daily between meals. 07/26/23 10/24/23  Kathlen Mody, MD  HYDROcodone-acetaminophen (NORCO/VICODIN) 5-325 MG tablet Take 1 tablet by mouth 3 (three) times daily as needed. Patient not taking: Reported on 07/25/2023 07/23/23   [provider]    Inpatient Medications: Scheduled Meds:  atorvastatin  5 mg Oral QHS   feeding supplement  237 mL Oral BID BM   lidocaine  1 patch Transdermal Q24H   rivaroxaban  15 mg Oral Q supper   tamsulosin  0.4 mg Oral QHS   Continuous Infusions:  PRN Meds: labetalol, melatonin, oxyCODONE, polyethylene glycol, prochlorperazine  Allergies:    Allergies  Allergen Reactions   Short Ragweed Pollen Ext Other (See Comments)    Unknown    Beef-Derived Drug Products Other (See Comments)    Patient does not eat beef    Eliquis [Apixaban] Itching   Lasix [Furosemide] Itching   Other  Other (See Comments) and Hypertension    Fusid- Legs became very swollen   Tylenol [Acetaminophen] Hives    Social History:   Social History   Socioeconomic History   Marital status: Married    Spouse name: Carollee Leitz   Number of children: 4   Years of education: Not on file   Highest education level: Professional school degree (e.g., MD, DDS, DVM, JD)  Occupational History   Occupation: economist  Tobacco Use   Smoking status: Former    Types: Cigarettes   Smokeless tobacco: Never   Tobacco comments:    Former smoker 03/29/23  Vaping Use   Vaping status: Never Used  Substance and Sexual Activity   Alcohol use: Never   Drug use: Never   Sexual activity: Not Currently  Other Topics Concern   Not on file  Social History Narrative  Lives with wife   Job [employer] copy    Social Drivers of Health   Financial Resource Strain: Low Risk  (09/04/2021)   Overall Financial Resource Strain (CARDIA)    Difficulty of Paying Living Expenses: Not hard at all  Food Insecurity: No Food Insecurity (07/24/2023)   Hunger Vital Sign    Worried About Running Out of Food in the Last Year: Never true    Ran Out of Food in the Last Year: Never true  Transportation Needs: Unmet Transportation Needs (07/24/2023)   PRAPARE - Administrator, Civil Service (Medical): Yes    Lack of Transportation (Non-Medical): No  Physical Activity: Not on file  Stress: No Stress Concern Present (09/04/2021)   [employer] of Occupational Health - Occupational Stress Questionnaire    Feeling of Stress : Only a little  Social Connections: Unknown (07/24/2023)   Social Connection and Isolation Panel [NHANES]    Frequency of Communication with Friends and Family: Twice a week    Frequency of Social Gatherings with Friends and Family: Patient declined    Attends Religious Services: Patient declined    Database administrator or Organizations: Yes    Attends Banker Meetings: 1 to 4 times  per year    Marital Status: Married  Catering manager Violence: Patient Unable To Answer (07/24/2023)   Humiliation, Afraid, Rape, and Kick questionnaire    Fear of Current or Ex-Partner: Patient unable to answer    Emotionally Abused: Patient unable to answer    Physically Abused: Patient unable to answer    Sexually Abused: Patient unable to answer    Family History:   History reviewed. No pertinent family history.   ROS:  Please see the history of present illness.   All other ROS reviewed and negative.     Physical Exam/Data:   Vitals:   07/26/23 0003 07/26/23 0422 07/26/23 0731 07/26/23 1137  BP: 128/69 139/64 (!) 150/85 (!) 143/68  Pulse: 95 62 64 (!) 50  Resp: 18  16 19   Temp: 97.6 F (36.4 C) 98.4 F (36.9 C) 97.7 F (36.5 C) (!) 97.4 F (36.3 C)  TempSrc: Axillary Oral Oral Oral  SpO2: 95% 96% 97% 100%  Weight:      Height:        Intake/Output Summary (Last 24 hours) at 07/26/2023 1437 Last data filed at 07/26/2023 0900 Gross per 24 hour  Intake 300 ml  Output --  Net 300 ml      07/24/2023    9:28 PM 03/29/2023   11:55 AM 01/28/2023    2:33 PM  Last 3 Weights  Weight (lbs) 185 lb 188 lb 12.8 oz 187 lb 9.6 oz  Weight (kg) 83.915 kg 85.639 kg 85.095 kg     Body mass index is 28.13 kg/m.  General:  elderly male in NAD HEENT: normal Neck: no JVD Cardiac:  irregular rhythm, bradycardic rate, 4/6 systolic murmur Lungs:  clear to auscultation bilaterally, no wheezing, rhonchi or rales  Abd: soft, nontender, no hepatomegaly  Ext: LE edema primarily on the left Musculoskeletal:  No deformities, BUE and BLE strength normal and equal Skin: warm and dry  Neuro:  CNs 2-12 intact, no focal abnormalities noted Psych:  Normal affect   EKG:  The EKG was personally reviewed and demonstrates:  atrial fibrillation with ventricular rate 61 Telemetry:  Telemetry was personally reviewed and demonstrates:  Afib with rates in the 40-60s, pauses less than 3 sec  Relevant CV  Studies:  Echo 07/25/23:  1. Left ventricular ejection fraction, by estimation, is >75%. Left  ventricular ejection fraction by PLAX is 78 %. The left ventricle has  hyperdynamic function. The left ventricle has no regional wall motion  abnormalities. There is moderate left  ventricular hypertrophy. Left ventricular diastolic function could not be  evaluated.   2. Left atrial size was severely dilated.   3. Right atrial size was severely dilated.   4. The mitral valve is degenerative. Trivial mitral valve regurgitation.  Severe mitral annular calcification.   5. There is moderate calcification of the aortic valve. Suspect at least  moderate aortic valve stenosis, however the valve was not completely  evaluated during this study.   6. Aortic dilatation noted. There is mild dilatation of the ascending  aorta, measuring 41 mm.   7. Tricuspid regurgitation signal is inadequate for assessing PA  pressure.   8. The inferior vena cava is normal in size with greater than 50%  respiratory variability, suggesting right atrial pressure of 3 mmHg.    Echo 03/2023:  1. Left ventricular ejection fraction, by estimation, is 65 to 70%. The  left ventricle has normal function. The left ventricle has no regional  wall motion abnormalities. There is mild concentric left ventricular  hypertrophy. Left ventricular diastolic  parameters are indeterminate.   2. Right ventricular systolic function is mildly reduced. The right  ventricular size is normal.   3. Left atrial size was severely dilated.   4. Right atrial size was moderately dilated.   5. The mitral valve is normal in structure. Trivial mitral valve  regurgitation. No evidence of mitral stenosis. Moderate mitral annular  calcification.   6. The aortic valve was not well visualized. There is moderate  calcification of the aortic valve. Aortic valve regurgitation is mild to  moderate. Moderate aortic valve stenosis. Aortic valve mean gradient   measures 27.0 mmHg, AVA 1.5 cm^2.   7. Aortic dilatation noted. There is moderate dilatation of the ascending  aorta, measuring 45 mm.   8. The inferior vena cava is normal in size with greater than 50%  respiratory variability, suggesting right atrial pressure of 3 mmHg.   9. The patient was in atrial fibrillation.   Laboratory Data:  High Sensitivity Troponin:  No results for input(s): "TROPONINIHS" in the last 720 hours.   Chemistry Recent Labs  Lab 07/24/23 1242 07/25/23 0657  NA 140 139  K 3.9 3.7  CL 104 107  CO2 28 27  GLUCOSE 91 89  BUN 27* 25*  CREATININE 1.20 1.03  CALCIUM 10.3 9.8  MG  --  1.8  GFRNONAA 56* >60  ANIONGAP 8 5    Recent Labs  Lab 07/24/23 1242  PROT 7.0  ALBUMIN 4.3  AST 19  ALT 14  ALKPHOS 98  BILITOT 1.0   Lipids  Recent Labs  Lab 07/25/23 0657  CHOL 107  TRIG 74  HDL 42  LDLCALC 50  CHOLHDL 2.5    Hematology Recent Labs  Lab 07/24/23 1242  WBC 6.9  RBC 4.23  HGB 13.7  HCT 40.9  MCV 96.7  MCH 32.4  MCHC 33.5  RDW 13.9  PLT 156   Thyroid No results for input(s): "TSH", "FREET4" in the last 168 hours.  BNPNo results for input(s): "BNP", "PROBNP" in the last 168 hours.  DDimer No results for input(s): "DDIMER" in the last 168 hours.   Radiology/Studies:  MR KNEE RIGHT WO CONTRAST Result Date: 07/25/2023 CLINICAL DATA:  Right knee pain for 2 weeks. EXAM: MRI OF THE RIGHT KNEE WITHOUT CONTRAST TECHNIQUE: Multiplanar, multisequence MR imaging of the knee was performed. No intravenous contrast was administered. COMPARISON:  Right knee radiographs dated 07/24/2023. FINDINGS: MENISCI Medial: Degeneration of the body and posterior horn of the medial meniscus with edema at the posteromedial meniscocapsular junction. Lateral: Intact. LIGAMENTS Cruciates: ACL and PCL are intact. Collaterals: Medial collateral ligament is intact. Lateral collateral ligament complex is intact. CARTILAGE Patellofemoral: Diffuse high-grade thinning of  the patellar and femoral trochlear articular cartilage with subchondral cystic changes and marrow edema. Medial: Complete loss of the central weight-bearing articular cartilage of the medial femoral condyle and severe thinning of the medial tibial plateau articular cartilage. Lateral: Mild chondral thinning of the lateral compartment articular cartilage. JOINT: Moderate joint effusion with synovitis. Normal Hoffa's fat-pad. POPLITEAL FOSSA: Popliteus tendon is intact. Ruptured Baker's cyst. EXTENSOR MECHANISM: Intact quadriceps tendon. Intact patellar tendon. BONES: Subchondral insufficiency fracture with mild depression of the central weight-bearing aspect of the medial femoral condyle with associated marrow edema. No displaced fragment. No suspicious marrow replacing lesion. Tricompartmental osteophytosis. Other: Muscles are within normal limits for age. IMPRESSION: 1. Subchondral insufficiency fracture of the central weight-bearing aspect of the medial femoral condyle with associated marrow edema. 2. Degeneration of the body and posterior horn of the medial meniscus with posteromedial meniscocapsular junction injury. 3. 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 4. Moderate knee joint effusion with synovitis. Electronically Signed   By: Hart Robinsons M.D.   On: 07/25/2023 19:50   ECHOCARDIOGRAM LIMITED Result Date: 07/25/2023    ECHOCARDIOGRAM LIMITED REPORT   Patient Name:   Zachary Underwood Date of Exam: 07/25/2023 Medical Rec #:  [id number]       Height:       68.0 in Accession #:    [id number]      Weight:       185.0 lb Date of Birth:  05-19-1927       BSA:          1.977 m Patient Age:    89 years        BP:           128/73 mmHg Patient Gender: M               HR:           68 bpm. Exam Location:  Inpatient Procedure: 2D Echo, Color Doppler and Cardiac Doppler (Both Spectral and Color            Flow Doppler were utilized  during procedure). Indications:     Stroke I63.9  History:         Patient has prior history of Echocardiogram examinations, most                  recent 04/05/2023. Stroke; Risk Factors:Hypertension.  Sonographer:     Harriette Bouillon RDCS Referring Phys:  [id number] Oliver Pila HALL Diagnosing Phys: Zoila Shutter MD IMPRESSIONS  1. Left ventricular ejection fraction, by estimation, is >75%. Left ventricular ejection fraction by PLAX is 78 %. The left ventricle has hyperdynamic function. The left ventricle has no regional wall motion abnormalities. There is moderate left ventricular hypertrophy. Left ventricular diastolic function could not be evaluated.  2. Left atrial size was severely dilated.  3. Right atrial size was severely dilated.  4. The mitral valve is degenerative. Trivial mitral valve regurgitation. Severe mitral annular calcification.  5.  There is moderate calcification of the aortic valve. Suspect at least moderate aortic valve stenosis, however the valve was not completely evaluated during this study.  6. Aortic dilatation noted. There is mild dilatation of the ascending aorta, measuring 41 mm.  7. Tricuspid regurgitation signal is inadequate for assessing PA pressure.  8. The inferior vena cava is normal in size with greater than 50% respiratory variability, suggesting right atrial pressure of 3 mmHg. Comparison(s): Changes from prior study are noted. 04/05/2023: LVEF 65-70%. Conclusion(s)/Recommendation(s): The aortic valve was incompletely evaluated. It appears to be moderate to severely calcified. The prior study in 03/2023 demonstrate moderate AS. Would recommend repeat limited imaging to re-evaluate the valve. FINDINGS  Left Ventricle: Left ventricular ejection fraction, by estimation, is >75%. Left ventricular ejection fraction by PLAX is 78 %. The left ventricle has hyperdynamic function. The left ventricle has no regional wall motion abnormalities. The left ventricular internal cavity size was  normal in size. There is moderate left ventricular hypertrophy. Left ventricular diastolic function could not be evaluated. Left ventricular diastolic function could not be evaluated due to atrial fibrillation. Right Ventricle: Tricuspid regurgitation signal is inadequate for assessing PA pressure. Left Atrium: Left atrial size was severely dilated. Right Atrium: Right atrial size was severely dilated. Mitral Valve: The mitral valve is degenerative in appearance. There is moderate calcification of the anterior and posterior mitral valve leaflet(s). Severe mitral annular calcification. Trivial mitral valve regurgitation. Tricuspid Valve: The tricuspid valve is grossly normal. Tricuspid valve regurgitation is not demonstrated. Aortic Valve: There is moderate calcification of the aortic valve. Moderate aortic stenosis is present. Aorta: Aortic dilatation noted. There is mild dilatation of the ascending aorta, measuring 41 mm. Venous: The inferior vena cava is normal in size with greater than 50% respiratory variability, suggesting right atrial pressure of 3 mmHg. IAS/Shunts: No atrial level shunt detected by color flow Doppler. LEFT VENTRICLE PLAX 2D LV EF:         Left ventricular ejection fraction by PLAX is 78 %. LVIDd:         3.10 cm LVIDs:         1.70 cm LV PW:         1.40 cm LV IVS:        1.30 cm LVOT diam:     2.10 cm LVOT Area:     3.46 cm  RIGHT ATRIUM           Index RA Area:     34.60 cm RA Volume:   131.00 ml 66.26 ml/m   AORTA Ao Root diam: 3.20 cm Ao Asc diam:  4.10 cm  SHUNTS Systemic Diam: 2.10 cm Zoila Shutter MD Electronically signed by Zoila Shutter MD Signature Date/Time: 07/25/2023/5:10:30 PM    Final (Updated)    MR BRAIN WO CONTRAST Result Date: 07/25/2023 CLINICAL DATA:  Transient ischemic attack EXAM: MRI HEAD WITHOUT CONTRAST TECHNIQUE: Multiplanar, multiecho pulse sequences of the brain and surrounding structures were obtained without intravenous contrast. COMPARISON:  08/22/2021  FINDINGS: Brain: No acute infarct, mass effect or extra-axial collection. No acute or chronic hemorrhage. Normal white matter signal, parenchymal volume and CSF spaces. The midline structures are normal. Old left cerebellar infarct. Vascular: Normal flow voids. Skull and upper cervical spine: Normal calvarium and skull base. Visualized upper cervical spine and soft tissues are normal. Sinuses/Orbits:No paranasal sinus fluid levels or advanced mucosal thickening. No mastoid or middle ear effusion. Normal orbits. IMPRESSION: 1. No acute intracranial abnormality. 2. Old left cerebellar infarct. Electronically  Signed   By: Deatra Robinson M.D.   On: 07/25/2023 00:02   CT Angio Head Neck W WO CM Result Date: 07/24/2023 CLINICAL DATA:  Transient ischemic attack, dizziness and slurred speech around 8 a.m. this morning. EXAM: CT ANGIOGRAPHY HEAD AND NECK WITH AND WITHOUT CONTRAST TECHNIQUE: Multidetector CT imaging of the head and neck was performed using the standard protocol during bolus administration of intravenous contrast. Multiplanar CT image reconstructions and MIPs were obtained to evaluate the vascular anatomy. Carotid stenosis measurements (when applicable) are obtained utilizing NASCET criteria, using the distal internal carotid diameter as the denominator. RADIATION DOSE REDUCTION: This exam was performed according to the departmental dose-optimization program which includes automated exposure control, adjustment of the mA and/or kV according to patient size and/or use of iterative reconstruction technique. CONTRAST:  75mL OMNIPAQUE IOHEXOL 350 MG/ML SOLN COMPARISON:  CTA head and neck 08/23/2021, MRI head 08/22/2021. FINDINGS: CT HEAD FINDINGS Brain: No acute intracranial hemorrhage. No CT evidence of acute infarct. 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 Nonspecific hypoattenuation in the periventricular and subcortical white matter favored to reflect chronic microvascular ischemic changes. No edema, mass  effect, or midline shift. The basilar cisterns are patent. Ventricles: Prominence of the ventricles suggestive of underlying parenchymal volume loss. Vascular: No hyperdense vessel. Intracranial atherosclerotic calcifications noted. Skull: No acute or aggressive finding. Sinuses/orbits: The visualized paranasal sinuses are clear. Orbits are symmetric. Other: Mastoid air cells are clear. CTA NECK FINDINGS Aortic arch: Standard configuration of the aortic arch. Moderate atherosclerosis of the visualized aortic arch. The distal ascending aorta/proximal aortic arch measures of 4.1 cm in diameter. Imaged portion shows no evidence of dissection. No significant stenosis of the major arch vessel origins. Pulmonary arteries: As permitted by contrast timing, there are no filling defects in the visualized pulmonary arteries. Subclavian arteries: The subclavian arteries are patent bilaterally. Right carotid system: Patent from the origin to the skull base. Mild tortuosity of the proximal common carotid artery. Bulky calcified atherosclerosis at the carotid bifurcation extending into the proximal cervical ICA with increased circumferential calcification compared to the prior study. There is approximately 70% stenosis of the proximal right cervical ICA increased from prior. Left carotid system: Patent from the origin to the skull base. Mild tortuosity of the common carotid artery. Bulky calcified atherosclerosis at the carotid bifurcation resulting in approximately 70% stenosis of the proximal left cervical ICA overall similar to prior. Vertebral arteries: Right vertebral artery is dominant. No evidence of dissection, stenosis (50% or greater), or occlusion. Atherosclerosis involving the bilateral vertebral artery origins resulting in mild stenosis similar to prior. Skeleton: No acute findings. Degenerative changes in the cervical spine. Trace anterolisthesis of C5 on C6. Sequelae of median sternotomy. Other neck: The visualized  airway is patent. No cervical lymphadenopathy. Upper chest: Visualized lung apices are clear. Review of the MIP images confirms the above findings CTA HEAD FINDINGS ANTERIOR CIRCULATION: The intracranial ICAs are patent bilaterally. Atherosclerosis of the carotid siphons resulting in mild stenosis. There is additional moderate stenosis of the right supraclinoid ICA which appears slightly increased from prior. No significant stenosis, proximal occlusion, aneurysm, or vascular malformation. MCAs: The middle cerebral arteries are patent bilaterally. ACAs: The anterior cerebral arteries are patent bilaterally. POSTERIOR CIRCULATION: No significant stenosis, proximal occlusion, aneurysm, or vascular malformation. PCAs: Patent bilaterally.  Fetal origin of the left PCA. Pcomm: Visualized on the left. SCAs: The superior cerebellar arteries are patent bilaterally. Basilar artery: Patent AICAs: Patent PICAs: Patent Vertebral arteries: Patent bilaterally. Atherosclerosis of the right  V4 segment resulting in mild stenosis. Venous sinuses: As permitted by contrast timing, patent. Anatomic variants: Fetal origin of the left PCA. Review of the MIP images confirms the above findings IMPRESSION: No large vessel occlusion. No CT evidence of acute intracranial abnormality. Multifocal atherosclerosis as above, slightly increased since 2023. 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 Approximately 70% stenosis of the proximal left cervical ICA, overall similar to prior. Focal moderate stenosis of the right supraclinoid ICA, increased from prior. Moderate atherosclerosis of the aortic arch. Prominence of the distal ascending aorta/proximal arch measuring up to 4.1 cm, similar to prior. Chronic microvascular ischemic changes. 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 Electronically Signed   By: Emily Filbert M.D.   On: 07/24/2023 15:15   DG Knee Right Port Result Date: 07/24/2023 CLINICAL DATA:  Right  knee pain for several weeks. EXAM: PORTABLE RIGHT KNEE - 1-2 VIEW COMPARISON:  None Available. FINDINGS: No evidence of fracture, dislocation, or joint effusion. Moderate narrowing of medial joint space is noted as well as patellofemoral space. Soft tissues are unremarkable. IMPRESSION: Moderate degenerative joint disease as noted above. No acute abnormality seen. Electronically Signed   By: Lupita Raider M.D.   On: 07/24/2023 14:43     Assessment and Plan:   Permanent atrial fibrillation with slow ventricular response - heart monitor 02/2023 showed Afib with pauses up to 4.6 sec, pauses typically during nocturnal hours - he has had several episodes of near-syncope / syncope reported by Dr. Allyson Sabal - patient describes positional dizziness - was seen in Afib clinic 03/2023 who felt pauses were typically nocturnal and no urgent indication for pacing - on no AV nodal agents - I'm not certain that SVR or pauses are causing symptoms at this time - can continue to monitor, if ongoing near-syncope, will refer to EP for consider of ILR to determine need for PPM, I think he could potentially be a candidate for a device   Dizziness - describes dizziness with position changes, I do not think symptoms are related to slow Afib or pauses   Chronic anticoagulation - compliant on 15 mg xarelto - no signs of bleeding   Moderate aortic stenosis - on recent echo, aortic valve not well visualized on yesterday's limited echo - no chest pain or shortness of breath   Hypertension - PTA on 2 mg candesartan   Hyperlipidemia  07/25/2023: Cholesterol 107; HDL 42; LDL Cholesterol 50; Triglycerides 74; VLDL 15 5 mg lipitor   Hx of CVA 2023 - ?cardioembolic - admitted with concern for TIA vs CVA - MRI brain did not show acute CVA - remains on 15 mg xarelto, compliant - neurology following    Risk Assessment/Risk Scores:    CHA2DS2-VASc Score = 6   This indicates a 9.7% annual risk of stroke. The  patient's score is based upon: CHF History: 0 HTN History: 1 Diabetes History: 0 Stroke History: 2 Vascular Disease History: 1 Age Score: 2 Gender Score: 0      For questions or updates, please contact [facility] Please consult [email or web address] for contact info under    Signed, Marcelino Duster, [place]  07/26/2023 2:37 PM'

## 2023-07-26 NOTE — Progress Notes (Addendum)
 [facility] OF Zachary Underwood  ASSESSMENT / PLAN: Zachary Underwood is a 89 y.o. male with bilateral asymptomatic carotid artery stenosis. No role for intervention at the moment. Recommend ASA / Statin. Will see patient in clinic in next 1-2 months.    SUBJECTIVE: No complaints. No new neurologic changes. Reviewed Dr. Warren Danes opinion.  OBJECTIVE: BP (!) 143/68 (BP Location: Left Arm)   Pulse (!) 50   Temp (!) 97.4 F (36.3 C) (Oral)   Resp 19   Ht 5\' 8"  (1.727 m)   Wt 83.9 kg   SpO2 100%   BMI 28.13 kg/m   Intake/Output Summary (Last 24 hours) at 07/26/2023 1304 Last data filed at 07/26/2023 0900 Gross per 24 hour  Intake 499.97 ml  Output --  Net 499.97 ml    No distress Regular rate and rhythm Unlabored breathing No CN abnormalities Moves all extremities spontaneously     Latest Ref Rng & Units 07/24/2023   12:42 PM 04/24/2022    6:28 PM 08/22/2021    4:16 PM  CBC  WBC 4.0 - 10.5 K/uL 6.9  7.5  6.5   Hemoglobin 13.0 - 17.0 g/dL 16.1  09.6  04.5   Hematocrit 39.0 - 52.0 % 40.9  35.0  39.8   Platelets 150 - 400 K/uL 156  237  192         Latest Ref Rng & Units 07/25/2023    6:57 AM 07/24/2023   12:42 PM 04/24/2022    6:28 PM  CMP  Glucose 70 - 99 mg/dL 89  91  93   BUN 8 - 23 mg/dL 25  27  36   Creatinine 0.61 - 1.24 mg/dL 4.09  8.11  9.14   Sodium 135 - 145 mmol/L 139  140  136   Potassium 3.5 - 5.1 mmol/L 3.7  3.9  3.3   Chloride 98 - 111 mmol/L 107  104  100   CO2 22 - 32 mmol/L 27  28  27    Calcium 8.9 - 10.3 mg/dL 9.8  78.2  9.6   Total Protein 6.5 - 8.1 g/dL  7.0    Total Bilirubin 0.0 - 1.2 mg/dL  1.0    Alkaline Phos 38 - 126 U/L  98    AST 15 - 41 U/L  19    ALT 0 - 44 U/L  14      Estimated Creatinine Clearance: 47.3 mL/min (by C-G formula based on SCr of 1.03 mg/dL).  Rande Brunt. Lenell Antu, MD [facility] Phone Number: [phone] 07/26/2023 1:04 PM

## 2023-07-26 NOTE — Progress Notes (Signed)
 Occupational Therapy Treatment Patient Details Name: Zachary Underwood MRN: [id number] DOB: 1927-11-24 Today's Date: 07/26/2023   History of present illness Pt is a 89 y/o male presenting on 07/24/23 with slurred speech, falls, and bilat hand numbness. MRI negative for acute abnormalities, suspect TIA. MRI of knee finding subchondral insufficiency fracture of the central weight-bearing  aspect of the medial femoral condyle with associated marrow edema    PMH includes: afib, h/o AAA s/p repair, OA, HTN, CHF, hx of CVA.   OT comments  OT seeing patient to further assess nuerological status and progress towards functional goals. Patient with continued intact cognition, and no numbness in bilateral hands. Education provided to patient with regard to BE FAST acronym for signs and symptoms of a stroke, and then completing functional mobility at [facility]. OT recommendation remains appropriate, will continue to follow acutely.      If plan is discharge home, recommend the following:  Assistance with cooking/housework;A little help with bathing/dressing/bathroom   Equipment Recommendations  None recommended by OT    Recommendations for Other Services      Precautions / Restrictions Precautions Precautions: Fall Recall of Precautions/Restrictions: Intact Restrictions Weight Bearing Restrictions Per Provider Order: No       Mobility Bed Mobility               General bed mobility comments: up in recliner at beginning of session    Transfers Overall transfer level: Needs assistance Equipment used: Rolling walker (2 wheels) Transfers: Sit to/from Stand Sit to Stand: Contact guard assist           General transfer comment: CGA with RW for sit<>stand transfers     Balance Overall balance assessment: Needs assistance Sitting-balance support: No upper extremity supported, Feet supported Sitting balance-Leahy Scale: Good       Standing balance-Leahy Scale: Poor Standing balance  comment: relaint on RW                           ADL either performed or assessed with clinical judgement   ADL Overall ADL's : Needs assistance/impaired                         Toilet Transfer: Ambulation;Rolling walker (2 wheels);Contact guard assist   Toileting- Clothing Manipulation and Hygiene: Sit to/from stand;Contact guard assist       Functional mobility during ADLs: Contact guard assist;Rolling walker (2 wheels) General ADL Comments: OT seeing patient to further assess nuerological status and progress towards functional goals. Patient with continued intact cognition, and no numbness in bilateral hands. Education provided to patient with regard to BE FAST acronym for signs and symptoms of a stroke, and then completing functional mobility at [facility]. OT recommendation remains appropriate, will continue to follow acutely.    Extremity/Trunk Assessment Upper Extremity Assessment Upper Extremity Assessment: Overall WFL for tasks assessed   Lower Extremity Assessment Lower Extremity Assessment: Defer to PT evaluation        Vision       Perception     Praxis     Communication Communication Communication: Impaired Factors Affecting Communication: Hearing impaired   Cognition Arousal: Alert Behavior During Therapy: WFL for tasks assessed/performed Cognition: No apparent impairments, Difficult to assess Difficult to assess due to: Hard of hearing/deaf           OT - Cognition Comments: Pt HOH even with hearing aides.  Following commands: Intact        Cueing   Cueing Techniques: Verbal cues  Exercises      Shoulder Instructions       General Comments      Pertinent Vitals/ Pain       Pain Assessment Pain Assessment: Faces Faces Pain Scale: Hurts a little bit Pain Location: R knee Pain Descriptors / Indicators: Aching, Sore Pain Intervention(s): Limited activity within patient's tolerance, Monitored during  session, Repositioned  Home Living Family/patient expects to be discharged to:: Private residence Living Arrangements: Spouse/significant other Available Help at Discharge: Family;Available 24 hours/day Type of Home: House Home Access: Stairs to enter [employer] of Steps: 3 steps Entrance Stairs-Rails: Left;Right;Can reach both Home Layout: Bed/bath upstairs     Bathroom Shower/Tub: Chief Strategy Officer: Standard     Home Equipment: Agricultural consultant (2 wheels);Cane - quad;Shower seat;BSC/3in1;Rollator (4 wheels)   Additional Comments: Has home health OT 1x/wk, and HHPT HHSLP. Sleeping on sofas since fall in march      Prior Functioning/Environment              Frequency  Min 2X/week        Progress Toward Goals  OT Goals(current goals can now be found in the care plan section)  Progress towards OT goals: Progressing toward goals  Acute Rehab OT Goals Patient Stated Goal: to go home OT Goal Formulation: With patient Time For Goal Achievement: 08/08/23 Potential to Achieve Goals: Good  Plan      Co-evaluation                 AM-PAC OT "6 Clicks" Daily Activity     Outcome Measure   Help from another person eating meals?: None Help from another person taking care of personal grooming?: A Little Help from another person toileting, which includes using toliet, bedpan, or urinal?: A Little Help from another person bathing (including washing, rinsing, drying)?: A Little Help from another person to put on and taking off regular upper body clothing?: A Little Help from another person to put on and taking off regular lower body clothing?: A Lot 6 Click Score: 18    End of Session Equipment Utilized During Treatment: Rolling walker (2 wheels)  OT Visit Diagnosis: History of falling (Z91.81);Unsteadiness on feet (R26.81);Pain Pain - Right/Left: Right Pain - part of body: Knee   Activity Tolerance Patient tolerated treatment well    Patient Left in chair;with call bell/phone within reach;with chair alarm set   Nurse Communication Mobility status        Time: 1478-2956 OT Time Calculation (min): 21 min  Charges: OT General Charges $OT Visit: 1 Visit OT Treatments $Self Care/Home Management : 8-22 mins  Pollyann Glen E. Tiffay Pinette, OTR/L Acute Rehabilitation Services [phone]   Cherlyn Cushing 07/26/2023, 10:36 AM

## 2023-07-26 NOTE — Progress Notes (Signed)
 Physical Therapy Re-Evaluation Patient Details Name: Zachary Underwood MRN: [id number] DOB: May 31, 1927 Today's Date: 07/26/2023   History of Present Illness Pt is a 89 y/o male presenting on 07/24/23 with slurred speech, falls, and bilat hand numbness. MRI negative for acute abnormalities, suspect TIA. MRI of knee finding subchondral insufficiency fracture of the central weight-bearing  aspect of the medial femoral condyle with associated marrow edema on 4/3. NWB status placed on 4/4.  PMH includes: afib, h/o AAA s/p repair, OA, HTN, CHF, hx of CVA.    PT Comments  Pt was seen by this physical therapist yesterday and was Mod I to ind with all functional activities. Pt had new imaging done late yesterday evening and this morning was made NWB on the RLE due to fracture in RLE. PT was re-consulted and pt is currently 2 person Mod A for sit to stand for brief period of time at recliner. Pt was unable to clear floor with LLE to progress transfers or gait due to insufficient strength. Due to pt current functional status, home set up and available assistance at home recommending skilled physical therapy services < 3 hours/day in order to address strength, balance and functional mobility to decrease risk for falls, injury, immobility, skin break down and re-hospitalization.      If plan is discharge home, recommend the following: Two people to help with walking and/or transfers;Assistance with cooking/housework;Assist for transportation;Help with stairs or ramp for entrance   Can travel by private vehicle     No  Equipment Recommendations  Hospital bed;Hoyer lift;Wheelchair cushion (measurements PT);Wheelchair (measurements PT)       Precautions / Restrictions Precautions Precautions: Fall Recall of Precautions/Restrictions: Intact Restrictions Weight Bearing Restrictions Per Provider Order: Yes RLE Weight Bearing Per Provider Order: Non weight bearing     Mobility  Bed Mobility   General bed  mobility comments: up in recliner at beginning and end of session    Transfers Overall transfer level: Needs assistance Equipment used: Rolling walker (2 wheels) Transfers: Sit to/from Stand Sit to Stand: Mod assist, +2 physical assistance, +2 safety/equipment           General transfer comment: Pt had difficulty maintaining NWB status on RLE. Therapist foot under pt foot with intermittent ~25% of wgt through RLE with frequent verbal cues. Pt unable to clear the floor with LLE due to insufficient strength in LLE and bil UE    Ambulation/Gait     General Gait Details: unable to ambulate while maintaining updated WB status     Balance Overall balance assessment: Needs assistance Sitting-balance support: No upper extremity supported, Feet supported Sitting balance-Leahy Scale: Normal     Standing balance support: Reliant on assistive device for balance, Bilateral upper extremity supported Standing balance-Leahy Scale: Zero Standing balance comment: Heavy UE support and 2 person assist to maintain NWB status on RLE        Communication Communication Communication: Impaired Factors Affecting Communication: Hearing impaired  Cognition Arousal: Alert Behavior During Therapy: WFL for tasks assessed/performed   PT - Cognitive impairments: No apparent impairments     Following commands: Intact      Cueing Cueing Techniques: Verbal cues     General Comments General comments (skin integrity, edema, etc.): No noted skin issues. No signs/symptoms of cardiac/respiroty distress with activity. On pt request spoke with pt spouse about new WB status and pt current functional level while trying to maintain WB status. Spouse is upset and unsure of current orthopedic plan. Orthopedic PA Zachary Needle  Leotis Underwood notified along with pt RN, case Production designer, theatre/television/film, Child psychotherapist, Neuro NP and hospitalist to help get pt and spouse information to answer all questions. Pt spouse had mutliple questions which were  not all questions this physical therapist was able to answer including detailed insurance questions and questions on pt orthopedic plan.      Pertinent Vitals/Pain Pain Assessment Faces Pain Scale: Hurts a little bit Pain Location: R knee Pain Descriptors / Indicators: Aching, Sore Pain Intervention(s): Monitored during session     PT Goals (current goals can now be found in the care plan section) Acute Rehab PT Goals PT Goal Formulation: With patient/family Additional Goals Additional Goal #1: Will navigate W/C 150 ft at supervision level in order to improve independence and maintain NWB status Progress towards PT goals: Progressing toward goals    Frequency    Min 2X/week      PT Plan  Continue with current POC        AM-PAC PT "6 Clicks" Mobility   Outcome Measure  Help needed turning from your back to your side while in a flat bed without using bedrails?: A Little Help needed moving from lying on your back to sitting on the side of a flat bed without using bedrails?: A Little Help needed moving to and from a bed to a chair (including a wheelchair)?: Total Help needed standing up from a chair using your arms (e.g., wheelchair or bedside chair)?: Total Help needed to walk in hospital room?: Total Help needed climbing 3-5 steps with a railing? : Total 6 Click Score: 10    End of Session Equipment Utilized During Treatment: Gait belt Activity Tolerance: Patient tolerated treatment well Patient left: in chair;with call bell/phone within reach Nurse Communication: Mobility status PT Visit Diagnosis: Unsteadiness on feet (R26.81);Other abnormalities of gait and mobility (R26.89)     Time: 1410-1455 PT Time Calculation (min) (ACUTE ONLY): 45 min  Charges:    $Therapeutic Activity: 23-37 mins PT General Charges $$ ACUTE PT VISIT: 1 Visit                    Zachary Underwood, DPT, CLT  Acute Rehabilitation Services Office: [phone] (Secure chat  preferred)    Zachary Underwood 07/26/2023, 3:26 PM

## 2023-07-26 NOTE — TOC Progression Note (Signed)
 Transition of Care [facility]) - Progression Note    Patient Details  Name: Zachary Underwood MRN: [id number] Date of Birth: 12-06-1927  Transition of [facility]) CM/SW Contact  Baldemar Lenis, [place] Phone Number: 07/26/2023, 3:44 PM  Clinical Narrative:   CSW received update from PT that patient made NWB this morning, now recommending SNF placement. CSW spoke with patient's spouse, Zachary Underwood, to discuss change in recommendation. Zachary Underwood very frustrated that the patient's status was changed and nothing was explained to the patient or her about this change, CSW listened to her concerns. Zachary Underwood wondering if a second opinion could be placed with orthopedics, CSW relayed message to MD.  CSW discussed recommendation for SNF, Zachary Underwood is in agreement but Medicare will not cover at this time as patient is under observation. [facility], Zachary Underwood requesting that CSW send information to the [place] for placement. Paperwork complete and will send to [place], but [place] will not review until Monday; Zachary Underwood indicated understanding. CSW to follow.    Expected Discharge Plan: Skilled Nursing Facility Barriers to Discharge: Continued Medical Work up, English as a second language teacher  Expected Discharge Plan and Services   Discharge Planning Services: CM Consult Post Acute Care Choice: Home Health Living arrangements for the past 2 months: Single Family Home Expected Discharge Date: 07/26/23                         HH Arranged: PT, Speech Therapy, OT HH [facility] Date HH Agency Contacted: 07/25/23   Representative spoke with at [facility] Agency: Adele Dan   Social Determinants of Health (SDOH) Interventions SDOH Screenings   Food Insecurity: No Food Insecurity (07/24/2023)  Housing: Low Risk  (07/24/2023)  Transportation Needs: Unmet Transportation Needs (07/24/2023)  Utilities: Not At Risk (07/24/2023)  Depression (PHQ2-9): Low Risk  (09/04/2021)  Financial Resource Strain: Low Risk   (09/04/2021)  Social Connections: Unknown (07/24/2023)  Stress: No Stress Concern Present (09/04/2021)  Tobacco Use: Medium Risk (07/24/2023)    Readmission Risk Interventions     No data to display

## 2023-07-26 NOTE — Progress Notes (Addendum)
 STROKE TEAM PROGRESS NOTE   INTERIM HISTORY/SUBJECTIVE  Patient has remained hemodynamically stable and afebrile overnight but continues to have some bradycardia.  He reports that his right knee pain has improved.  OBJECTIVE  CBC    Component Value Date/Time   WBC 6.9 07/24/2023 1242   RBC 4.23 07/24/2023 1242   HGB 13.7 07/24/2023 1242   HCT 40.9 07/24/2023 1242   PLT 156 07/24/2023 1242   MCV 96.7 07/24/2023 1242   MCH 32.4 07/24/2023 1242   MCHC 33.5 07/24/2023 1242   RDW 13.9 07/24/2023 1242   LYMPHSABS 0.7 07/24/2023 1242   MONOABS 0.5 07/24/2023 1242   EOSABS 0.2 07/24/2023 1242   BASOSABS 0.1 07/24/2023 1242    BMET    Component Value Date/Time   NA 139 07/25/2023 0657   K 3.7 07/25/2023 0657   CL 107 07/25/2023 0657   CO2 27 07/25/2023 0657   GLUCOSE 89 07/25/2023 0657   BUN 25 (H) 07/25/2023 0657   CREATININE 1.03 07/25/2023 0657   CALCIUM 9.8 07/25/2023 0657   GFRNONAA >60 07/25/2023 0657    IMAGING past 24 hours MR KNEE RIGHT WO CONTRAST Result Date: 07/25/2023 CLINICAL DATA:  Right knee pain for 2 weeks. EXAM: MRI OF THE RIGHT KNEE WITHOUT CONTRAST TECHNIQUE: Multiplanar, multisequence MR imaging of the knee was performed. No intravenous contrast was administered. COMPARISON:  Right knee radiographs dated 07/24/2023. FINDINGS: MENISCI Medial: Degeneration of the body and posterior horn of the medial meniscus with edema at the posteromedial meniscocapsular junction. Lateral: Intact. LIGAMENTS Cruciates: ACL and PCL are intact. Collaterals: Medial collateral ligament is intact. Lateral collateral ligament complex is intact. CARTILAGE Patellofemoral: Diffuse high-grade thinning of the patellar and femoral trochlear articular cartilage with subchondral cystic changes and marrow edema. Medial: Complete loss of the central weight-bearing articular cartilage of the medial femoral condyle and severe thinning of the medial tibial plateau articular cartilage. Lateral: Mild  chondral thinning of the lateral compartment articular cartilage. JOINT: Moderate joint effusion with synovitis. Normal Hoffa's fat-pad. POPLITEAL FOSSA: Popliteus tendon is intact. Ruptured Baker's cyst. EXTENSOR MECHANISM: Intact quadriceps tendon. Intact patellar tendon. BONES: Subchondral insufficiency fracture with mild depression of the central weight-bearing aspect of the medial femoral condyle with associated marrow edema. No displaced fragment. No suspicious marrow replacing lesion. Tricompartmental osteophytosis. Other: Muscles are within normal limits for age. IMPRESSION: 1. Subchondral insufficiency fracture of the central weight-bearing aspect of the medial femoral condyle with associated marrow edema. 2. Degeneration of the body and posterior horn of the medial meniscus with posteromedial meniscocapsular junction injury. 3. 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 4. Moderate knee joint effusion with synovitis. Electronically Signed   By: Hart Robinsons M.D.   On: 07/25/2023 19:50   ECHOCARDIOGRAM LIMITED Result Date: 07/25/2023    ECHOCARDIOGRAM LIMITED REPORT   Patient Name:   Zachary Underwood Date of Exam: 07/25/2023 Medical Rec #:  [id number]       Height:       68.0 in Accession #:    [id number]      Weight:       185.0 lb Date of Birth:  20-Feb-1928       BSA:          1.977 m Patient Age:    89 years        BP:           128/73 mmHg Patient Gender: M  HR:           68 bpm. Exam Location:  Inpatient Procedure: 2D Echo, Color Doppler and Cardiac Doppler (Both Spectral and Color            Flow Doppler were utilized during procedure). Indications:     Stroke I63.9  History:         Patient has prior history of Echocardiogram examinations, most                  recent 04/05/2023. Stroke; Risk Factors:Hypertension.  Sonographer:     Harriette Bouillon RDCS Referring Phys:  [id number] Oliver Pila HALL Diagnosing  Phys: Zoila Shutter MD IMPRESSIONS  1. Left ventricular ejection fraction, by estimation, is >75%. Left ventricular ejection fraction by PLAX is 78 %. The left ventricle has hyperdynamic function. The left ventricle has no regional wall motion abnormalities. There is moderate left ventricular hypertrophy. Left ventricular diastolic function could not be evaluated.  2. Left atrial size was severely dilated.  3. Right atrial size was severely dilated.  4. The mitral valve is degenerative. Trivial mitral valve regurgitation. Severe mitral annular calcification.  5. There is moderate calcification of the aortic valve. Suspect at least moderate aortic valve stenosis, however the valve was not completely evaluated during this study.  6. Aortic dilatation noted. There is mild dilatation of the ascending aorta, measuring 41 mm.  7. Tricuspid regurgitation signal is inadequate for assessing PA pressure.  8. The inferior vena cava is normal in size with greater than 50% respiratory variability, suggesting right atrial pressure of 3 mmHg. Comparison(s): Changes from prior study are noted. 04/05/2023: LVEF 65-70%. Conclusion(s)/Recommendation(s): The aortic valve was incompletely evaluated. It appears to be moderate to severely calcified. The prior study in 03/2023 demonstrate moderate AS. Would recommend repeat limited imaging to re-evaluate the valve. FINDINGS  Left Ventricle: Left ventricular ejection fraction, by estimation, is >75%. Left ventricular ejection fraction by PLAX is 78 %. The left ventricle has hyperdynamic function. The left ventricle has no regional wall motion abnormalities. The left ventricular internal cavity size was normal in size. There is moderate left ventricular hypertrophy. Left ventricular diastolic function could not be evaluated. Left ventricular diastolic function could not be evaluated due to atrial fibrillation. Right Ventricle: Tricuspid regurgitation signal is inadequate for assessing PA  pressure. Left Atrium: Left atrial size was severely dilated. Right Atrium: Right atrial size was severely dilated. Mitral Valve: The mitral valve is degenerative in appearance. There is moderate calcification of the anterior and posterior mitral valve leaflet(s). Severe mitral annular calcification. Trivial mitral valve regurgitation. Tricuspid Valve: The tricuspid valve is grossly normal. Tricuspid valve regurgitation is not demonstrated. Aortic Valve: There is moderate calcification of the aortic valve. Moderate aortic stenosis is present. Aorta: Aortic dilatation noted. There is mild dilatation of the ascending aorta, measuring 41 mm. Venous: The inferior vena cava is normal in size with greater than 50% respiratory variability, suggesting right atrial pressure of 3 mmHg. IAS/Shunts: No atrial level shunt detected by color flow Doppler. LEFT VENTRICLE PLAX 2D LV EF:         Left ventricular ejection fraction by PLAX is 78 %. LVIDd:         3.10 cm LVIDs:         1.70 cm LV PW:         1.40 cm LV IVS:        1.30 cm LVOT diam:     2.10 cm LVOT Area:  3.46 cm  RIGHT ATRIUM           Index RA Area:     34.60 cm RA Volume:   131.00 ml 66.26 ml/m   AORTA Ao Root diam: 3.20 cm Ao Asc diam:  4.10 cm  SHUNTS Systemic Diam: 2.10 cm Zoila Shutter MD Electronically signed by Zoila Shutter MD Signature Date/Time: 07/25/2023/5:10:30 PM    Final (Updated)     Vitals:   07/26/23 0003 07/26/23 0422 07/26/23 0731 07/26/23 1137  BP: 128/69 139/64 (!) 150/85 (!) 143/68  Pulse: 95 62 64 (!) 50  Resp: 18  16 19   Temp: 97.6 F (36.4 C) 98.4 F (36.9 C) 97.7 F (36.5 C) (!) 97.4 F (36.3 C)  TempSrc: Axillary Oral Oral Oral  SpO2: 95% 96% 97% 100%  Weight:      Height:         PHYSICAL EXAM General:  Alert, well-nourished, well-developed patient in no acute distress Psych:  Mood and affect appropriate for situation Respiratory:  Regular, unlabored respirations on room air  NEURO:  Mental Status: AA&Ox3,  patient is able to give clear and coherent history Speech/Language: speech is without dysarthria or aphasia.    Cranial Nerves:  II: PERRL. Visual fields full.  III, IV, VI: EOMI. Eyelids elevate symmetrically.  V: Sensation is intact to light touch and symmetrical to face.  VII: Face is symmetrical resting and smiling VIII: hearing intact to voice. IX, X: Palate elevates symmetrically. Phonation is normal.  ZO:[id number] shrug 5/5. XII: tongue is midline without fasciculations. Motor: 5/5 strength to all muscle groups tested, right leg slightly weaker than left due to knee pain.  Tone: is normal and bulk is normal Sensation- Intact to light touch bilaterally. Extinction absent to light touch to DSS.   Coordination: FTN intact bilaterally Gait- deferred  Most Recent NIH 0   ASSESSMENT/PLAN  Zachary Underwood is a 89 y.o. 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  MRI was negative for stroke, and symptoms were thought to be due to TIA.  CT angiogram revealed 70% stenosis of bilateral carotid arteries.  Vascular surgery was consulted but does not wish to perform interventions at this time.  Question whether patient's bradycardia especially in the setting of pauses seen on outpatient cardiac monitor last year could have caused his symptoms, cardiology consulted.  NIH on Admission 0  Dizziness and slurry speech in the setting of excruciating knee pain and narcotic use, not convinced for TIA Pt had intermittent right foot and knee pain since 3/9 after working with PT. Had extensive workup ruled out DVT or fracture.  3/16 had fall due to leg pain, [facility], [facility] again negative. Prescribed steroids but not working 3/23 still pain with bruising of the knee but also felt b/l hand  numbness tingling 4/2 he woke up in the morning -> went to bathroom, felt vertigo, lasted a couple of minutes -> went back to bed feeling excruciating pain, and started to have slurry speech -> moved to downstairs, BP 160s which was high for him, given tramadol and pain got better -> however, still had slurry speech till afternoon -> now resolved but still has right knee pain CT head No acute abnormality.  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  Focal moderate stenosis right supraclinoid ICA, increased from prior MRI no acute abnormality 2D Echo EF more than 75% LDL 50 HgbA1c 4.9 VTE prophylaxis -Xarelto Xarelto (rivaroxaban) daily prior to admission, Xarelto resumed.  Continue on discharge Therapy recommendations: SNF Disposition: Pending   Hx of Stroke/TIA 08/2021 admitted for 3-4 punctate left parietal infarcts.  CTA head and neck showed bilateral ICA 50 to 65% stenosis.  EF 70 to 75%.  LDL 54, A1c 5.4.  Recommend change Xarelto to Eliquis, but patient has intolerance to Eliquis.  Bilateral carotid artery stenosis CT showed 70% stenosis of bilateral carotid arteries However, patient symptoms not convinced for stroke/TIA Vascular on board, also considered asymptomatic, continue outpatient follow-up.  If more than 80%, may consider carotid intervention.  Bradycardia History of syncope Patient heart rate overnight high 30s Follow-up with cardiology in 03/2023, reported some syncope/hypoxic episodes.  Cardiac monitoring showed permanent A-fib with multiple pauses.  Plan to refer to EP to determine if a loop recorder would be reasonable.  Discussed with patient, and he would like to avoid loop recorder if possible but states he is amenable to another 30-day cardiac monitor. Patient symptoms may be possible  in the setting of bradycardia with bilateral carotid stenosis. EP consulted, no pacemaker indicated at this time.  Recommend continue monitoring, if ongoing near syncope, will refer to EP for loop recorder versus pacemaker.  Atrial fibrillation Home Meds: Xarelto 15 mg daily Continue telemetry monitoring Resume Xarelto  Medial femoral condyle fracture Right knee pain X-ray no fracture MRI showed Subchondral insufficiency fracture of the central weight-bearing aspect of the medial femoral condyle with associated marrow edema. Orthopedic on board, recommend nonweightbearing for now, follow-up in clinic in 2 to 3 weeks  Hypertension Home meds: Candesartan 2 mg daily Stable Maintain normotension Orthostatic vital signs negative  Hyperlipidemia Home meds: Atorvastatin 5 mg daily LDL 50, goal < 70 Continue Lipitor 5 High intensity statin not indicated as LDL below goal Continue statin at discharge  Other Stroke Risk Factors Advanced age  Other Active Problems   Hospital day # 0  Patient seen by NP and then by MD, MD to edit note as needed. Cortney E Ernestina Columbia , MSN, AGACNP-BC [facility] See Amion for schedule and pager information 07/26/2023 1:02 PM   ATTENDING NOTE: I reviewed above note and agree with the assessment and plan. Pt was seen and examined.   No family at bedside.  Patient sitting in chair, right knee pain has improved, MRI showed medial femoral condyle fracture, recommend nonweightbearing, follow-up with orthopedics in 2 to 3 weeks.  Cardiology on board, no indication for pacemaker.  May consider loop recorder or pacemaker if recurrent near syncope.  Vascular surgery also considered a asymptomatic carotid artery stenosis.  Continue medical management.  Currently on Xarelto and statin.  Orthostatic vitals negative for orthostatic hypotension.  PT now recommends SNF.  For detailed assessment and plan, please refer to above as I have made changes  wherever appropriate.   Neurology will sign off. Please call with questions. Pt will follow up with Dr. Terrace Arabia at [facility] in about 4 weeks. Thanks for the consult.   Marvel Plan, MD PhD Stroke Neurology 07/26/2023 5:43 PM    To contact Stroke Continuity provider, please refer to [email or web address]. After hours, contact General Neurology

## 2023-07-26 NOTE — Consult Note (Signed)
 Reason for Consult:Right fem condyle insuf fx Referring Physician: Kathlen Mody Time called: 0930 Time at bedside: 1017   Zachary Underwood is an 89 y.o. male.  HPI: Zachary Underwood fell about 2 weeks ago, hitting both knees. He initially had significant pain in both but the left has nearly resolved. He continues to have pain in the right. It is worst with WB. He was seen by his orthopedist Dr. Charlann Boxer shortly after the fall who injected the knee with no improvement. MRI was done that showed an insufficiency fx in the medial femoral condyle and orthopedic surgery was consulted.   Past Medical History:  Diagnosis Date   Arthritis    Cardiac arrhythmia    Hypertension    Ruptured aortic aneurysm [facility])    Stroke (HCC) 08/23/2021    Past Surgical History:  Procedure Laterality Date   open heart surgery     ruptured aortic aneurysm repair      History reviewed. No pertinent family history.  Social History:  reports that he has quit smoking. His smoking use included cigarettes. He has never used smokeless tobacco. He reports that he does not drink alcohol and does not use drugs.  Allergies:  Allergies  Allergen Reactions   Short Ragweed Pollen Ext Other (See Comments)    Unknown    Beef-Derived Drug Products Other (See Comments)    Patient does not eat beef    Eliquis [Apixaban] Itching   Lasix [Furosemide] Itching   Other Other (See Comments) and Hypertension    Fusid- Legs became very swollen   Tylenol [Acetaminophen] Hives    Medications: I have reviewed the patient's current medications.  Results for orders placed or performed during the hospital encounter of 07/24/23 (from the past 48 hours)  CBG monitoring, ED     Status: Abnormal   Collection Time: 07/24/23 12:26 PM  Result Value Ref Range   Glucose-Capillary 109 (H) 70 - 99 mg/dL    Comment: Glucose reference range applies only to samples taken after fasting for at least 8 hours.  Ethanol     Status: None   Collection Time:  07/24/23 12:42 PM  Result Value Ref Range   Alcohol, Ethyl (B) <10 <10 mg/dL    Comment: (NOTE) Lowest detectable limit for serum alcohol is 10 mg/dL.  For medical purposes only. Performed at [facility], [address] [place], [place] [address]   Protime-INR     Status: Abnormal   Collection Time: 07/24/23 12:42 PM  Result Value Ref Range   Prothrombin Time 19.6 (H) 11.4 - 15.2 seconds   INR 1.6 (H) 0.8 - 1.2    Comment: (NOTE) INR goal varies based on device and disease states. Performed at [facility], [address] [place], [place] [address]   APTT     Status: Abnormal   Collection Time: 07/24/23 12:42 PM  Result Value Ref Range   aPTT 43 (H) 24 - 36 seconds    Comment:        IF BASELINE aPTT IS ELEVATED, SUGGEST PATIENT RISK ASSESSMENT BE USED TO DETERMINE APPROPRIATE ANTICOAGULANT THERAPY. Performed at [facility], [address], [place], [place] [address]   CBC     Status: None   Collection Time: 07/24/23 12:42 PM  Result Value Ref Range   WBC 6.9 4.0 - 10.5 K/uL   RBC 4.23 4.22 - 5.81 MIL/uL   Hemoglobin 13.7 13.0 - 17.0 g/dL   HCT 86.5 78.4 - 69.6 %  MCV 96.7 80.0 - 100.0 fL   MCH 32.4 26.0 - 34.0 pg   MCHC 33.5 30.0 - 36.0 g/dL   RDW 29.5 62.1 - 30.8 %   Platelets 156 150 - 400 K/uL   nRBC 0.0 0.0 - 0.2 %    Comment: Performed at [facility], [address] [place], [place] [address]  Differential     Status: None   Collection Time: 07/24/23 12:42 PM  Result Value Ref Range   Neutrophils Relative % 79 %   Neutro Abs 5.6 1.7 - 7.7 K/uL   Lymphocytes Relative 10 %   Lymphs Abs 0.7 0.7 - 4.0 K/uL   Monocytes Relative 7 %   Monocytes Absolute 0.5 0.1 - 1.0 K/uL   Eosinophils Relative 2 %   Eosinophils Absolute 0.2 0.0 - 0.5 K/uL   Basophils Relative 1 %   Basophils Absolute 0.1 0.0 - 0.1 K/uL   Immature Granulocytes 1 %   Abs Immature Granulocytes 0.04 0.00 -  0.07 K/uL    Comment: Performed at [facility], [address] [place], [place] [address]  Comprehensive metabolic panel     Status: Abnormal   Collection Time: 07/24/23 12:42 PM  Result Value Ref Range   Sodium 140 135 - 145 mmol/L   Potassium 3.9 3.5 - 5.1 mmol/L   Chloride 104 98 - 111 mmol/L   CO2 28 22 - 32 mmol/L   Glucose, Bld 91 70 - 99 mg/dL    Comment: Glucose reference range applies only to samples taken after fasting for at least 8 hours.   BUN 27 (H) 8 - 23 mg/dL   Creatinine, Ser 5.28 0.61 - 1.24 mg/dL   Calcium 41.3 8.9 - 24.4 mg/dL   Total Protein 7.0 6.5 - 8.1 g/dL   Albumin 4.3 3.5 - 5.0 g/dL   AST 19 15 - 41 U/L   ALT 14 0 - 44 U/L   Alkaline Phosphatase 98 38 - 126 U/L   Total Bilirubin 1.0 0.0 - 1.2 mg/dL   GFR, Estimated 56 (L) >60 mL/min    Comment: (NOTE) Calculated using the CKD-EPI Creatinine Equation (2021)    Anion gap 8 5 - 15    Comment: Performed at [facility], [address], [place], [place] [address]  Urine rapid drug screen (hosp performed)     Status: None   Collection Time: 07/24/23  1:55 PM  Result Value Ref Range   Opiates NONE DETECTED NONE DETECTED   Cocaine NONE DETECTED NONE DETECTED   Benzodiazepines NONE DETECTED NONE DETECTED   Amphetamines NONE DETECTED NONE DETECTED   Tetrahydrocannabinol NONE DETECTED NONE DETECTED   Barbiturates NONE DETECTED NONE DETECTED    Comment: (NOTE) DRUG SCREEN FOR MEDICAL PURPOSES ONLY.  IF CONFIRMATION IS NEEDED FOR ANY PURPOSE, NOTIFY LAB WITHIN 5 DAYS.  LOWEST DETECTABLE LIMITS FOR URINE DRUG SCREEN Drug Class                     Cutoff (ng/mL) Amphetamine and metabolites    1000 Barbiturate and metabolites    200 Benzodiazepine                 200 Opiates and metabolites        300 Cocaine and metabolites        300 THC  50 Performed at [facility], [address], [place], [place] [address]    Urinalysis, Routine w reflex microscopic -Urine, Clean Catch     Status: None   Collection Time: 07/24/23  1:55 PM  Result Value Ref Range   Color, Urine YELLOW YELLOW   APPearance CLEAR CLEAR   Specific Gravity, Urine 1.018 1.005 - 1.030   pH 5.5 5.0 - 8.0   Glucose, UA NEGATIVE NEGATIVE mg/dL   Hgb urine dipstick NEGATIVE NEGATIVE   Bilirubin Urine NEGATIVE NEGATIVE   Ketones, ur NEGATIVE NEGATIVE mg/dL   Protein, ur NEGATIVE NEGATIVE mg/dL   Nitrite NEGATIVE NEGATIVE   Leukocytes,Ua NEGATIVE NEGATIVE    Comment: Performed at [facility], [address], [place], [place] [address]  Hemoglobin A1c     Status: None   Collection Time: 07/25/23  6:57 AM  Result Value Ref Range   Hgb A1c MFr Bld 4.9 4.8 - 5.6 %    Comment: (NOTE) Pre diabetes:          5.7%-6.4%  Diabetes:              >6.4%  Glycemic control for   <7.0% adults with diabetes    Mean Plasma Glucose 93.93 mg/dL    Comment: Performed at [facility], [address], [place], [place] [address]  Basic metabolic panel     Status: Abnormal   Collection Time: 07/25/23  6:57 AM  Result Value Ref Range   Sodium 139 135 - 145 mmol/L   Potassium 3.7 3.5 - 5.1 mmol/L   Chloride 107 98 - 111 mmol/L   CO2 27 22 - 32 mmol/L   Glucose, Bld 89 70 - 99 mg/dL    Comment: Glucose reference range applies only to samples taken after fasting for at least 8 hours.   BUN 25 (H) 8 - 23 mg/dL   Creatinine, Ser 8.46 0.61 - 1.24 mg/dL   Calcium 9.8 8.9 - 96.2 mg/dL   GFR, Estimated >95 >28 mL/min    Comment: (NOTE) Calculated using the CKD-EPI Creatinine Equation (2021)    Anion gap 5 5 - 15    Comment: Performed at [facility], [address], [place], [place] [address]  Magnesium     Status: None   Collection Time: 07/25/23  6:57 AM  Result Value Ref Range   Magnesium 1.8 1.7 - 2.4 mg/dL    Comment: Performed at [facility], [address] [place], [place] [address]  Lipid panel      Status: None   Collection Time: 07/25/23  6:57 AM  Result Value Ref Range   Cholesterol 107 0 - 200 mg/dL   Triglycerides 74 <725 mg/dL   HDL 42 >36 mg/dL   Total CHOL/HDL Ratio 2.5 RATIO   VLDL 15 0 - 40 mg/dL   LDL Cholesterol 50 0 - 99 mg/dL    Comment:        Total Cholesterol/HDL:CHD Risk Coronary Heart Disease Risk Table                     Men   Women  1/2 Average Risk   3.4   3.3  Average Risk       5.0   4.4  2 X Average Risk   9.6   7.1  3 X Average Risk  23.4   11.0        Use the calculated Patient Ratio above and the  CHD Risk Table to determine the patient's CHD Risk.        ATP III CLASSIFICATION (LDL):  <100     mg/dL   Optimal  956-213  mg/dL   Near or Above                    Optimal  130-159  mg/dL   Borderline  086-578  mg/dL   High  >469     mg/dL   Very High Performed at [facility], [address] [place], [place] [address]     MR KNEE RIGHT WO CONTRAST Result Date: 07/25/2023 CLINICAL DATA:  Right knee pain for 2 weeks. EXAM: MRI OF THE RIGHT KNEE WITHOUT CONTRAST TECHNIQUE: Multiplanar, multisequence MR imaging of the knee was performed. No intravenous contrast was administered. COMPARISON:  Right knee radiographs dated 07/24/2023. FINDINGS: MENISCI Medial: Degeneration of the body and posterior horn of the medial meniscus with edema at the posteromedial meniscocapsular junction. Lateral: Intact. LIGAMENTS Cruciates: ACL and PCL are intact. Collaterals: Medial collateral ligament is intact. Lateral collateral ligament complex is intact. CARTILAGE Patellofemoral: Diffuse high-grade thinning of the patellar and femoral trochlear articular cartilage with subchondral cystic changes and marrow edema. Medial: Complete loss of the central weight-bearing articular cartilage of the medial femoral condyle and severe thinning of the medial tibial plateau articular cartilage. Lateral: Mild chondral thinning of the lateral compartment articular cartilage. JOINT:  Moderate joint effusion with synovitis. Normal Hoffa's fat-pad. POPLITEAL FOSSA: Popliteus tendon is intact. Ruptured Baker's cyst. EXTENSOR MECHANISM: Intact quadriceps tendon. Intact patellar tendon. BONES: Subchondral insufficiency fracture with mild depression of the central weight-bearing aspect of the medial femoral condyle with associated marrow edema. No displaced fragment. No suspicious marrow replacing lesion. Tricompartmental osteophytosis. Other: Muscles are within normal limits for age. IMPRESSION: 1. Subchondral insufficiency fracture of the central weight-bearing aspect of the medial femoral condyle with associated marrow edema. 2. Degeneration of the body and posterior horn of the medial meniscus with posteromedial meniscocapsular junction injury. 3. 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 4. Moderate knee joint effusion with synovitis. Electronically Signed   By: Hart Robinsons M.D.   On: 07/25/2023 19:50   ECHOCARDIOGRAM LIMITED Result Date: 07/25/2023    ECHOCARDIOGRAM LIMITED REPORT   Patient Name:   Zachary Underwood Date of Exam: 07/25/2023 Medical Rec #:  [id number]       Height:       68.0 in Accession #:    [id number]      Weight:       185.0 lb Date of Birth:  09-22-1927       BSA:          1.977 m Patient Age:    89 years        BP:           128/73 mmHg Patient Gender: M               HR:           68 bpm. Exam Location:  Inpatient Procedure: 2D Echo, Color Doppler and Cardiac Doppler (Both Spectral and Color            Flow Doppler were utilized during procedure). Indications:     Stroke I63.9  History:         Patient has prior history of Echocardiogram examinations, most  recent 04/05/2023. Stroke; Risk Factors:Hypertension.  Sonographer:     Harriette Bouillon RDCS Referring Phys:  [id number] Oliver Pila HALL Diagnosing Phys: Zoila Shutter MD IMPRESSIONS  1. Left ventricular ejection fraction,  by estimation, is >75%. Left ventricular ejection fraction by PLAX is 78 %. The left ventricle has hyperdynamic function. The left ventricle has no regional wall motion abnormalities. There is moderate left ventricular hypertrophy. Left ventricular diastolic function could not be evaluated.  2. Left atrial size was severely dilated.  3. Right atrial size was severely dilated.  4. The mitral valve is degenerative. Trivial mitral valve regurgitation. Severe mitral annular calcification.  5. There is moderate calcification of the aortic valve. Suspect at least moderate aortic valve stenosis, however the valve was not completely evaluated during this study.  6. Aortic dilatation noted. There is mild dilatation of the ascending aorta, measuring 41 mm.  7. Tricuspid regurgitation signal is inadequate for assessing PA pressure.  8. The inferior vena cava is normal in size with greater than 50% respiratory variability, suggesting right atrial pressure of 3 mmHg. Comparison(s): Changes from prior study are noted. 04/05/2023: LVEF 65-70%. Conclusion(s)/Recommendation(s): The aortic valve was incompletely evaluated. It appears to be moderate to severely calcified. The prior study in 03/2023 demonstrate moderate AS. Would recommend repeat limited imaging to re-evaluate the valve. FINDINGS  Left Ventricle: Left ventricular ejection fraction, by estimation, is >75%. Left ventricular ejection fraction by PLAX is 78 %. The left ventricle has hyperdynamic function. The left ventricle has no regional wall motion abnormalities. The left ventricular internal cavity size was normal in size. There is moderate left ventricular hypertrophy. Left ventricular diastolic function could not be evaluated. Left ventricular diastolic function could not be evaluated due to atrial fibrillation. Right Ventricle: Tricuspid regurgitation signal is inadequate for assessing PA pressure. Left Atrium: Left atrial size was severely dilated. Right Atrium:  Right atrial size was severely dilated. Mitral Valve: The mitral valve is degenerative in appearance. There is moderate calcification of the anterior and posterior mitral valve leaflet(s). Severe mitral annular calcification. Trivial mitral valve regurgitation. Tricuspid Valve: The tricuspid valve is grossly normal. Tricuspid valve regurgitation is not demonstrated. Aortic Valve: There is moderate calcification of the aortic valve. Moderate aortic stenosis is present. Aorta: Aortic dilatation noted. There is mild dilatation of the ascending aorta, measuring 41 mm. Venous: The inferior vena cava is normal in size with greater than 50% respiratory variability, suggesting right atrial pressure of 3 mmHg. IAS/Shunts: No atrial level shunt detected by color flow Doppler. LEFT VENTRICLE PLAX 2D LV EF:         Left ventricular ejection fraction by PLAX is 78 %. LVIDd:         3.10 cm LVIDs:         1.70 cm LV PW:         1.40 cm LV IVS:        1.30 cm LVOT diam:     2.10 cm LVOT Area:     3.46 cm  RIGHT ATRIUM           Index RA Area:     34.60 cm RA Volume:   131.00 ml 66.26 ml/m   AORTA Ao Root diam: 3.20 cm Ao Asc diam:  4.10 cm  SHUNTS Systemic Diam: 2.10 cm Zoila Shutter MD Electronically signed by Zoila Shutter MD Signature Date/Time: 07/25/2023/5:10:30 PM    Final (Updated)    MR BRAIN WO CONTRAST Result Date: 07/25/2023 CLINICAL DATA:  Transient ischemic attack EXAM:  MRI HEAD WITHOUT CONTRAST TECHNIQUE: Multiplanar, multiecho pulse sequences of the brain and surrounding structures were obtained without intravenous contrast. COMPARISON:  08/22/2021 FINDINGS: Brain: No acute infarct, mass effect or extra-axial collection. No acute or chronic hemorrhage. Normal white matter signal, parenchymal volume and CSF spaces. The midline structures are normal. Old left cerebellar infarct. Vascular: Normal flow voids. Skull and upper cervical spine: Normal calvarium and skull base. Visualized upper cervical spine and soft  tissues are normal. Sinuses/Orbits:No paranasal sinus fluid levels or advanced mucosal thickening. No mastoid or middle ear effusion. Normal orbits. IMPRESSION: 1. No acute intracranial abnormality. 2. Old left cerebellar infarct. Electronically Signed   By: Deatra Robinson M.D.   On: 07/25/2023 00:02   CT Angio Head Neck W WO CM Result Date: 07/24/2023 CLINICAL DATA:  Transient ischemic attack, dizziness and slurred speech around 8 a.m. this morning. EXAM: CT ANGIOGRAPHY HEAD AND NECK WITH AND WITHOUT CONTRAST TECHNIQUE: Multidetector CT imaging of the head and neck was performed using the standard protocol during bolus administration of intravenous contrast. Multiplanar CT image reconstructions and MIPs were obtained to evaluate the vascular anatomy. Carotid stenosis measurements (when applicable) are obtained utilizing NASCET criteria, using the distal internal carotid diameter as the denominator. RADIATION DOSE REDUCTION: This exam was performed according to the departmental dose-optimization program which includes automated exposure control, adjustment of the mA and/or kV according to patient size and/or use of iterative reconstruction technique. CONTRAST:  75mL OMNIPAQUE IOHEXOL 350 MG/ML SOLN COMPARISON:  CTA head and neck 08/23/2021, MRI head 08/22/2021. FINDINGS: CT HEAD FINDINGS Brain: No acute intracranial hemorrhage. No CT evidence of acute infarct. 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 Nonspecific hypoattenuation in the periventricular and subcortical white matter favored to reflect chronic microvascular ischemic changes. No edema, mass effect, or midline shift. The basilar cisterns are patent. Ventricles: Prominence of the ventricles suggestive of underlying parenchymal volume loss. Vascular: No hyperdense vessel. Intracranial atherosclerotic calcifications noted. Skull: No acute or aggressive finding. Sinuses/orbits: The visualized paranasal sinuses are clear. Orbits are symmetric. Other: Mastoid air  cells are clear. CTA NECK FINDINGS Aortic arch: Standard configuration of the aortic arch. Moderate atherosclerosis of the visualized aortic arch. The distal ascending aorta/proximal aortic arch measures of 4.1 cm in diameter. Imaged portion shows no evidence of dissection. No significant stenosis of the major arch vessel origins. Pulmonary arteries: As permitted by contrast timing, there are no filling defects in the visualized pulmonary arteries. Subclavian arteries: The subclavian arteries are patent bilaterally. Right carotid system: Patent from the origin to the skull base. Mild tortuosity of the proximal common carotid artery. Bulky calcified atherosclerosis at the carotid bifurcation extending into the proximal cervical ICA with increased circumferential calcification compared to the prior study. There is approximately 70% stenosis of the proximal right cervical ICA increased from prior. Left carotid system: Patent from the origin to the skull base. Mild tortuosity of the common carotid artery. Bulky calcified atherosclerosis at the carotid bifurcation resulting in approximately 70% stenosis of the proximal left cervical ICA overall similar to prior. Vertebral arteries: Right vertebral artery is dominant. No evidence of dissection, stenosis (50% or greater), or occlusion. Atherosclerosis involving the bilateral vertebral artery origins resulting in mild stenosis similar to prior. Skeleton: No acute findings. Degenerative changes in the cervical spine. Trace anterolisthesis of C5 on C6. Sequelae of median sternotomy. Other neck: The visualized airway is patent. No cervical lymphadenopathy. Upper chest: Visualized lung apices are clear. Review of the MIP images confirms the above findings CTA HEAD  FINDINGS ANTERIOR CIRCULATION: The intracranial ICAs are patent bilaterally. Atherosclerosis of the carotid siphons resulting in mild stenosis. There is additional moderate stenosis of the right supraclinoid ICA which  appears slightly increased from prior. No significant stenosis, proximal occlusion, aneurysm, or vascular malformation. MCAs: The middle cerebral arteries are patent bilaterally. ACAs: The anterior cerebral arteries are patent bilaterally. POSTERIOR CIRCULATION: No significant stenosis, proximal occlusion, aneurysm, or vascular malformation. PCAs: Patent bilaterally.  Fetal origin of the left PCA. Pcomm: Visualized on the left. SCAs: The superior cerebellar arteries are patent bilaterally. Basilar artery: Patent AICAs: Patent PICAs: Patent Vertebral arteries: Patent bilaterally. Atherosclerosis of the right V4 segment resulting in mild stenosis. Venous sinuses: As permitted by contrast timing, patent. Anatomic variants: Fetal origin of the left PCA. Review of the MIP images confirms the above findings IMPRESSION: No large vessel occlusion. No CT evidence of acute intracranial abnormality. Multifocal atherosclerosis as above, slightly increased since 2023. 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 Approximately 70% stenosis of the proximal left cervical ICA, overall similar to prior. Focal moderate stenosis of the right supraclinoid ICA, increased from prior. Moderate atherosclerosis of the aortic arch. Prominence of the distal ascending aorta/proximal arch measuring up to 4.1 cm, similar to prior. Chronic microvascular ischemic changes. 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 Electronically Signed   By: Emily Filbert M.D.   On: 07/24/2023 15:15   DG Knee Right Port Result Date: 07/24/2023 CLINICAL DATA:  Right knee pain for several weeks. EXAM: PORTABLE RIGHT KNEE - 1-2 VIEW COMPARISON:  None Available. FINDINGS: No evidence of fracture, dislocation, or joint effusion. Moderate narrowing of medial joint space is noted as well as patellofemoral space. Soft tissues are unremarkable. IMPRESSION: Moderate degenerative joint disease as noted above. No acute abnormality seen.  Electronically Signed   By: Lupita Raider M.D.   On: 07/24/2023 14:43    Review of Systems  HENT:  Negative for ear discharge, ear pain, hearing loss and tinnitus.   Eyes:  Negative for photophobia and pain.  Respiratory:  Negative for cough and shortness of breath.   Cardiovascular:  Negative for chest pain.  Gastrointestinal:  Negative for abdominal pain, nausea and vomiting.  Genitourinary:  Negative for dysuria, flank pain, frequency and urgency.  Musculoskeletal:  Positive for arthralgias (Right knee). Negative for back pain, myalgias and neck pain.  Neurological:  Negative for dizziness and headaches.  Hematological:  Does not bruise/bleed easily.  Psychiatric/Behavioral:  The patient is not nervous/anxious.    Blood pressure (!) 150/85, pulse 64, temperature 97.7 F (36.5 C), temperature source Oral, resp. rate 16, height 5\' 8"  (1.727 m), weight 83.9 kg, SpO2 97%. Physical Exam Constitutional:      General: He is not in acute distress.    Appearance: He is well-developed. He is not diaphoretic.  HENT:     Head: Normocephalic and atraumatic.  Eyes:     General: No scleral icterus.       Right eye: No discharge.        Left eye: No discharge.     Conjunctiva/sclera: Conjunctivae normal.  Cardiovascular:     Rate and Rhythm: Normal rate and regular rhythm.  Pulmonary:     Effort: Pulmonary effort is normal. No respiratory distress.  Musculoskeletal:     Cervical back: Normal range of motion.     Comments: RLE No traumatic wounds, ecchymosis, or rash  Nontender  Mild knee effusion  Knee stable to varus/ valgus and anterior/posterior stress  Sens DPN, SPN, TN intact  Motor EHL, ext, flex, evers 5/5  DP 0, PT 0, No significant edema  Skin:    General: Skin is warm and dry.  Neurological:     Mental Status: He is alert.  Psychiatric:        Mood and Affect: Mood normal.        Behavior: Behavior normal.     Assessment/Plan: Right femoral condyle fx -- Plan  non-operative management with NWB. F/u with Dr. Charlann Boxer in 2-3 weeks.    Freeman Caldron, PA-C Orthopedic Surgery [phone] 07/26/2023, 10:25 AM

## 2023-07-26 NOTE — Progress Notes (Signed)
 [facility]                                                                               Zachary Underwood, is a 89 y.o. male, DOB - [birth year], WUJ:[id number] Admit date - 07/24/2023    Outpatient Primary MD for the patient is Manson Passey, Burman Nieves, NP  LOS - 0  days    Brief summary    Zachary Underwood is a 89 y.o. 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  Admitted for evaluation of acute stroke MRI brain without contrast is negative for acute stroke.  Echocardiogram reviewed.  LDL is 50 A1c is 4.9 CT angiogram showed. evidence of prior left cerebellum CVA.  No LVO  Restarted xarelto.    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  Outpatient follow up In clinic.     Right knee pain:  Worse than last week.  In the setting of fall.  Patient reports that he went to see an orthopedics and underwent intraarticular cortisol injection.  MRI of the right knee ordered  showed medial condyle fracture. Orthopedics consulted and recommendations given to keep it nwb and outpatient follow up with ortho in 2 to 3 weeks.    Permanent atrial fib on xarelto Rate controlled.   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    Hypertension:  Well controlled.    BPH resume home meds.  Estimated body mass index is 28.13 kg/m as calculated from the  following:   Height as of this encounter: 5\' 8"  (1.727 m).   Weight as of this encounter: 83.9 kg.  Code Status: full code.  DVT Prophylaxis:  xarelto.  Rivaroxaban (XARELTO) tablet 15 mg   Level of Care: Level of care: Telemetry Medical Family Communication: none at bedside.   Disposition Plan:     Remains inpatient appropriate:  SNF placement.   Procedures:  MRI brain Echo Mri of the right knee  Consultants:   Neurology Vascular surgery.   Antimicrobials:   Anti-infectives (From admission, onward)    None        Medications  Scheduled Meds:  atorvastatin  5 mg Oral QHS   feeding supplement  237 mL Oral BID BM   lidocaine  1 patch Transdermal Q24H   rivaroxaban  15 mg Oral Q supper   tamsulosin  0.4 mg Oral QHS   Continuous Infusions:   PRN Meds:.labetalol, melatonin, oxyCODONE, polyethylene glycol, prochlorperazine    Subjective:   Chaka Jefferys was seen and examined today.   No new complaints.   Objective:   Vitals:   07/26/23 0422 07/26/23 9562  07/26/23 1137 07/26/23 1540  BP: 139/64 (!) 150/85 (!) 143/68 (!) 144/73  Pulse: 62 64 (!) 50 62  Resp:  16 19 18   Temp: 98.4 F (36.9 C) 97.7 F (36.5 C) (!) 97.4 F (36.3 C) 98 F (36.7 C)  TempSrc: Oral Oral Oral Oral  SpO2: 96% 97% 100% 98%  Weight:      Height:        Intake/Output Summary (Last 24 hours) at 07/26/2023 1833 Last data filed at 07/26/2023 0900 Gross per 24 hour  Intake 300 ml  Output --  Net 300 ml   Filed Weights   07/24/23 2128  Weight: 83.9 kg     Exam General exam: Appears calm and comfortable  Respiratory system: Clear to auscultation. Respiratory effort normal. Cardiovascular system: S1 & S2 heard, irregularly irregular. No jVD. Gastrointestinal system: Abdomen is nondistended, soft and nontender.  Central nervous system: Alert and oriented. No focal neurological deficits. Extremities: right knee pain.  Skin: No rashes, Psychiatry: Mood & affect appropriate.      Data Reviewed:  I have personally reviewed following labs and imaging studies   CBC Lab Results  Component Value Date   WBC 6.9 07/24/2023   RBC 4.23 07/24/2023   HGB 13.7 07/24/2023   HCT 40.9 07/24/2023   MCV 96.7 07/24/2023   MCH 32.4 07/24/2023   PLT 156 07/24/2023   MCHC 33.5 07/24/2023   RDW 13.9 07/24/2023   LYMPHSABS 0.7 07/24/2023   MONOABS 0.5 07/24/2023   EOSABS 0.2 07/24/2023   BASOSABS 0.1 07/24/2023     Last metabolic panel Lab Results  Component Value Date   NA 139 07/25/2023   K 3.7 07/25/2023   CL 107 07/25/2023   CO2 27 07/25/2023   BUN 25 (H) 07/25/2023   CREATININE 1.03 07/25/2023   GLUCOSE 89 07/25/2023   GFRNONAA >60 07/25/2023   CALCIUM 9.8 07/25/2023   PROT 7.0 07/24/2023   ALBUMIN 4.3 07/24/2023   BILITOT 1.0 07/24/2023   ALKPHOS 98 07/24/2023   AST 19 07/24/2023   ALT 14 07/24/2023   ANIONGAP 5 07/25/2023    CBG (last 3)  Recent Labs    07/24/23 1226  GLUCAP 109*      Coagulation Profile: Recent Labs  Lab 07/24/23 1242  INR 1.6*     Radiology Studies: MR KNEE RIGHT WO CONTRAST Result Date: 07/25/2023 CLINICAL DATA:  Right knee pain for 2 weeks. EXAM: MRI OF THE RIGHT KNEE WITHOUT CONTRAST TECHNIQUE: Multiplanar, multisequence MR imaging of the knee was performed. No intravenous contrast was administered. COMPARISON:  Right knee radiographs dated 07/24/2023. FINDINGS: MENISCI Medial: Degeneration of the body and posterior horn of the medial meniscus with edema at the posteromedial meniscocapsular junction. Lateral: Intact. LIGAMENTS Cruciates: ACL and PCL are intact. Collaterals: Medial collateral ligament is intact. Lateral collateral ligament complex is intact. CARTILAGE Patellofemoral: Diffuse high-grade thinning of the patellar and femoral trochlear articular cartilage with subchondral cystic changes and marrow edema. Medial: Complete loss of the central weight-bearing articular cartilage of the medial femoral condyle  and severe thinning of the medial tibial plateau articular cartilage. Lateral: Mild chondral thinning of the lateral compartment articular cartilage. JOINT: Moderate joint effusion with synovitis. Normal Hoffa's fat-pad. POPLITEAL FOSSA: Popliteus tendon is intact. Ruptured Baker's cyst. EXTENSOR MECHANISM: Intact quadriceps tendon. Intact patellar tendon. BONES: Subchondral insufficiency fracture with mild depression of the central weight-bearing aspect of the medial femoral condyle with associated marrow edema. No displaced fragment. No suspicious marrow replacing lesion. Tricompartmental osteophytosis.  Other: Muscles are within normal limits for age. IMPRESSION: 1. Subchondral insufficiency fracture of the central weight-bearing aspect of the medial femoral condyle with associated marrow edema. 2. Degeneration of the body and posterior horn of the medial meniscus with posteromedial meniscocapsular junction injury. 3. 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 4. Moderate knee joint effusion with synovitis. Electronically Signed   By: Hart Robinsons M.D.   On: 07/25/2023 19:50   ECHOCARDIOGRAM LIMITED Result Date: 07/25/2023    ECHOCARDIOGRAM LIMITED REPORT   Patient Name:   Zachary Underwood Date of Exam: 07/25/2023 Medical Rec #:  [id number]       Height:       68.0 in Accession #:    [id number]      Weight:       185.0 lb Date of Birth:  11-Jan-1928       BSA:          1.977 m Patient Age:    89 years        BP:           128/73 mmHg Patient Gender: M               HR:           68 bpm. Exam Location:  Inpatient Procedure: 2D Echo, Color Doppler and Cardiac Doppler (Both Spectral and Color            Flow Doppler were utilized during procedure). Indications:     Stroke I63.9  History:         Patient has prior history of Echocardiogram examinations, most                  recent 04/05/2023. Stroke; Risk Factors:Hypertension.   Sonographer:     Harriette Bouillon RDCS Referring Phys:  [id number] Oliver Pila HALL Diagnosing Phys: Zoila Shutter MD IMPRESSIONS  1. Left ventricular ejection fraction, by estimation, is >75%. Left ventricular ejection fraction by PLAX is 78 %. The left ventricle has hyperdynamic function. The left ventricle has no regional wall motion abnormalities. There is moderate left ventricular hypertrophy. Left ventricular diastolic function could not be evaluated.  2. Left atrial size was severely dilated.  3. Right atrial size was severely dilated.  4. The mitral valve is degenerative. Trivial mitral valve regurgitation. Severe mitral annular calcification.  5. There is moderate calcification of the aortic valve. Suspect at least moderate aortic valve stenosis, however the valve was not completely evaluated during this study.  6. Aortic dilatation noted. There is mild dilatation of the ascending aorta, measuring 41 mm.  7. Tricuspid regurgitation signal is inadequate for assessing PA pressure.  8. The inferior vena cava is normal in size with greater than 50% respiratory variability, suggesting right atrial pressure of 3 mmHg. Comparison(s): Changes from prior study are noted. 04/05/2023: LVEF 65-70%. Conclusion(s)/Recommendation(s): The aortic valve was incompletely evaluated. It appears to be moderate to severely calcified. The prior study in 03/2023 demonstrate moderate AS. Would recommend repeat limited imaging to re-evaluate the valve. FINDINGS  Left Ventricle: Left ventricular ejection fraction, by estimation, is >75%. Left ventricular ejection fraction by PLAX is 78 %. The left ventricle has hyperdynamic function. The left ventricle has no regional wall motion abnormalities. The left ventricular internal cavity size was normal in size. There is moderate left ventricular hypertrophy. Left ventricular diastolic function could not be evaluated. Left ventricular diastolic function could not be evaluated due to atrial  fibrillation.  Right Ventricle: Tricuspid regurgitation signal is inadequate for assessing PA pressure. Left Atrium: Left atrial size was severely dilated. Right Atrium: Right atrial size was severely dilated. Mitral Valve: The mitral valve is degenerative in appearance. There is moderate calcification of the anterior and posterior mitral valve leaflet(s). Severe mitral annular calcification. Trivial mitral valve regurgitation. Tricuspid Valve: The tricuspid valve is grossly normal. Tricuspid valve regurgitation is not demonstrated. Aortic Valve: There is moderate calcification of the aortic valve. Moderate aortic stenosis is present. Aorta: Aortic dilatation noted. There is mild dilatation of the ascending aorta, measuring 41 mm. Venous: The inferior vena cava is normal in size with greater than 50% respiratory variability, suggesting right atrial pressure of 3 mmHg. IAS/Shunts: No atrial level shunt detected by color flow Doppler. LEFT VENTRICLE PLAX 2D LV EF:         Left ventricular ejection fraction by PLAX is 78 %. LVIDd:         3.10 cm LVIDs:         1.70 cm LV PW:         1.40 cm LV IVS:        1.30 cm LVOT diam:     2.10 cm LVOT Area:     3.46 cm  RIGHT ATRIUM           Index RA Area:     34.60 cm RA Volume:   131.00 ml 66.26 ml/m   AORTA Ao Root diam: 3.20 cm Ao Asc diam:  4.10 cm  SHUNTS Systemic Diam: 2.10 cm Zoila Shutter MD Electronically signed by Zoila Shutter MD Signature Date/Time: 07/25/2023/5:10:30 PM    Final (Updated)    MR BRAIN WO CONTRAST Result Date: 07/25/2023 CLINICAL DATA:  Transient ischemic attack EXAM: MRI HEAD WITHOUT CONTRAST TECHNIQUE: Multiplanar, multiecho pulse sequences of the brain and surrounding structures were obtained without intravenous contrast. COMPARISON:  08/22/2021 FINDINGS: Brain: No acute infarct, mass effect or extra-axial collection. No acute or chronic hemorrhage. Normal white matter signal, parenchymal volume and CSF spaces. The midline structures are  normal. Old left cerebellar infarct. Vascular: Normal flow voids. Skull and upper cervical spine: Normal calvarium and skull base. Visualized upper cervical spine and soft tissues are normal. Sinuses/Orbits:No paranasal sinus fluid levels or advanced mucosal thickening. No mastoid or middle ear effusion. Normal orbits. IMPRESSION: 1. No acute intracranial abnormality. 2. Old left cerebellar infarct. Electronically Signed   By: Deatra Robinson M.D.   On: 07/25/2023 00:02       Kathlen Mody M.D. [facility] 07/26/2023, 6:33 PM  Available via Epic secure chat 7am-7pm After 7 pm, please refer to night coverage provider listed on amion.

## 2023-07-26 NOTE — Progress Notes (Signed)
 Stroke education given to pt. We discussed BEFAST, 911 activation as well as individual risk factors for stroke.  Verneda Skill RN Stroke Response

## 2023-07-27 DIAGNOSIS — I6523 Occlusion and stenosis of bilateral carotid arteries: Secondary | ICD-10-CM | POA: Diagnosis not present

## 2023-07-27 DIAGNOSIS — I480 Paroxysmal atrial fibrillation: Secondary | ICD-10-CM

## 2023-07-27 DIAGNOSIS — G459 Transient cerebral ischemic attack, unspecified: Secondary | ICD-10-CM | POA: Diagnosis not present

## 2023-07-27 DIAGNOSIS — S72431D Displaced fracture of medial condyle of right femur, subsequent encounter for closed fracture with routine healing: Secondary | ICD-10-CM

## 2023-07-27 NOTE — Plan of Care (Signed)
  Problem: Clinical Measurements: Goal: Will remain free from infection Outcome: Progressing Goal: Respiratory complications will improve Outcome: Progressing Goal: Cardiovascular complication will be avoided Outcome: Progressing   

## 2023-07-27 NOTE — Progress Notes (Signed)
 [facility]                                                                               Daytona Retana, is a 89 y.o. male, DOB - 05-Sep-1927, WUJ:[id number] Admit date - 07/24/2023    Outpatient Primary MD for the patient is Manson Passey, Burman Nieves, NP  LOS - 1  days    Brief summary    VEGA WITHROW is a 89 y.o. 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  Admitted for evaluation of acute stroke MRI brain without contrast is negative for acute stroke.  Echocardiogram reviewed.  LDL is 50 A1c is 4.9 CT angiogram showed. evidence of prior left cerebellum CVA.  No LVO  Restarted xarelto.     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  Outpatient follow up In clinic.     Right knee pain:  Worse than last week.  In the setting of fall.  Patient reports that he went to see an orthopedics and underwent intraarticular cortisol injection.  MRI of the right knee ordered  showed medial condyle fracture. Orthopedics consulted and recommendations given to keep it nwb and outpatient follow up with ortho in 2 to 3 weeks.  Patient would like ot know if he can get a scooter and go head instead of going to SNF.    Permanent atrial fib on xarelto Rate controlled.   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    Hypertension:  Well controlled.     BPH resume home meds.  Estimated body mass index is 28.13 kg/m as calculated from the following:   Height as of this encounter: 5\' 8"  (1.727 m).   Weight as of this encounter: 83.9 kg.  Code Status: full code.  DVT Prophylaxis:  xarelto.  Rivaroxaban (XARELTO) tablet 15 mg   Level of Care: Level of care: Telemetry Medical Family Communication: none at bedside.   Disposition Plan:     Remains inpatient appropriate:  SNF placement.   Procedures:  MRI brain Echo Mri of the right knee  Consultants:   Neurology Vascular surgery.   Antimicrobials:   Anti-infectives (From admission, onward)    None        Medications  Scheduled Meds:  atorvastatin  5 mg Oral QHS   feeding supplement  237 mL Oral BID BM   lidocaine  1 patch Transdermal Q24H   rivaroxaban  15 mg Oral Q supper   tamsulosin  0.4 mg Oral QHS   Continuous Infusions:   PRN Meds:.labetalol, melatonin, oxyCODONE, polyethylene glycol, prochlorperazine    Subjective:   Olamide Carattini was  seen and examined today.  HE CONTINUES to bear weight on the right knee despite discussing about offloading on the right knee.   Objective:   Vitals:   07/27/23 0337 07/27/23 0749 07/27/23 1128 07/27/23 1521  BP: 128/62 (!) 153/84 (!) (P) 148/66 135/61  Pulse: (!) 51 (!) 56 (P) 62 (!) 57  Resp: 18 16 (P) 18 18  Temp: 97.7 F (36.5 C) (!) 97.5 F (36.4 C) (!) (P) 97.4 F (36.3 C) (!) 97.5 F (36.4 C)  TempSrc: Oral Oral (P) Oral Oral  SpO2: 97% 97% (P) 99% 93%  Weight:      Height:       No intake or output data in the 24 hours ending 07/27/23 1657  Filed Weights   07/24/23 2128  Weight: 83.9 kg     Exam General exam: Appears calm and comfortable  Respiratory system: Clear to auscultation. Respiratory effort normal. Cardiovascular system: S1 & S2 heard, RRR. No JVD, Gastrointestinal system: Abdomen is nondistended, soft and nontender.  Central nervous system: Alert and oriented.  Extremities:  right knee pain on the medial aspect.  Skin: No rashes,  Psychiatry: Mood & affect appropriate.      Data Reviewed:  I have personally reviewed following labs and imaging studies   CBC Lab Results  Component Value Date   WBC 6.9 07/24/2023   RBC 4.23 07/24/2023   HGB 13.7 07/24/2023   HCT 40.9 07/24/2023   MCV 96.7 07/24/2023   MCH 32.4 07/24/2023   PLT 156 07/24/2023   MCHC 33.5 07/24/2023   RDW 13.9 07/24/2023   LYMPHSABS 0.7 07/24/2023   MONOABS 0.5 07/24/2023   EOSABS 0.2 07/24/2023   BASOSABS 0.1 07/24/2023     Last metabolic panel Lab Results  Component Value Date   NA 139 07/25/2023   K 3.7 07/25/2023   CL 107 07/25/2023   CO2 27 07/25/2023   BUN 25 (H) 07/25/2023   CREATININE 1.03 07/25/2023   GLUCOSE 89 07/25/2023   GFRNONAA >60 07/25/2023   CALCIUM 9.8 07/25/2023   PROT 7.0 07/24/2023   ALBUMIN 4.3 07/24/2023   BILITOT 1.0 07/24/2023   ALKPHOS 98 07/24/2023   AST 19 07/24/2023   ALT 14 07/24/2023   ANIONGAP 5 07/25/2023    CBG (last 3)  No results for input(s): "GLUCAP" in the last 72 hours.     Coagulation Profile: Recent Labs  Lab 07/24/23 1242  INR 1.6*     Radiology Studies: No results found.      Kathlen Mody M.D. [facility] 07/27/2023, 4:57 PM  Available via Epic secure chat 7am-7pm After 7 pm, please refer to night coverage provider listed on amion.

## 2023-07-27 NOTE — Progress Notes (Signed)
 Patient continues to refuses cardiac monitoring.  Will monitor pulse checks throughout shift.  Explained the importance of remaining NWB to his right knee, patient shown how to stand and pivot by NT to use bedside commode.  Patient then found ambulating in room on his own from the chair to the bathroom 45 minutes later without use of the call bell for assistance.  Instructions reinforced, will continue to monitor.

## 2023-07-27 NOTE — Plan of Care (Signed)
  Problem: Education: Goal: Knowledge of General Education information will improve Description: Including pain rating scale, medication(s)/side effects and non-pharmacologic comfort measures Outcome: Progressing   Problem: Health Behavior/Discharge Planning: Goal: Ability to manage health-related needs will improve Outcome: Progressing   Problem: Clinical Measurements: Goal: Ability to maintain clinical measurements within normal limits will improve Outcome: Progressing Goal: Will remain free from infection Outcome: Progressing Goal: Diagnostic test results will improve Outcome: Progressing Goal: Respiratory complications will improve Outcome: Progressing Goal: Cardiovascular complication will be avoided Outcome: Progressing   Problem: Activity: Goal: Risk for activity intolerance will decrease Outcome: Progressing   Problem: Nutrition: Goal: Adequate nutrition will be maintained Outcome: Progressing   Problem: Coping: Goal: Level of anxiety will decrease Outcome: Progressing   Problem: Elimination: Goal: Will not experience complications related to bowel motility Outcome: Progressing Goal: Will not experience complications related to urinary retention Outcome: Progressing   Problem: Pain Managment: Goal: General experience of comfort will improve and/or be controlled Outcome: Progressing   Problem: Safety: Goal: Ability to remain free from injury will improve Outcome: Progressing   Problem: Skin Integrity: Goal: Risk for impaired skin integrity will decrease Outcome: Progressing   Problem: Education: Goal: Knowledge of disease or condition will improve Outcome: Progressing Goal: Knowledge of secondary prevention will improve (MUST DOCUMENT ALL) Outcome: Progressing Goal: Knowledge of patient specific risk factors will improve (DELETE if not current risk factor) Outcome: Progressing   Problem: Ischemic Stroke/TIA Tissue Perfusion: Goal: Complications of  ischemic stroke/TIA will be minimized Outcome: Progressing

## 2023-07-28 DIAGNOSIS — I10 Essential (primary) hypertension: Secondary | ICD-10-CM | POA: Diagnosis not present

## 2023-07-28 DIAGNOSIS — G459 Transient cerebral ischemic attack, unspecified: Secondary | ICD-10-CM | POA: Diagnosis not present

## 2023-07-28 DIAGNOSIS — I6523 Occlusion and stenosis of bilateral carotid arteries: Secondary | ICD-10-CM | POA: Diagnosis not present

## 2023-07-28 DIAGNOSIS — M25561 Pain in right knee: Secondary | ICD-10-CM | POA: Diagnosis not present

## 2023-07-28 MED ORDER — ORAL CARE MOUTH RINSE: 15.0000 mL | OROMUCOSAL | Status: DC | PRN

## 2023-07-28 NOTE — Progress Notes (Addendum)
 The DC plan is for pt to return home. He needs a scooter, wheelchair, HH PT, and an aide. Met with pt at bedside. He doesn't know if his wife is coming today to see him. He gave me permission to contact his wife. Contacted wife and she confirmed the DC plan is home with [facility]. She reports that pt got up last night by himself to go to the bathroom. She reports he can come home since he is able to walk. She reports that pt has a HH aide through the [place] with [facility]. Pt is active with [facility] for [facility] PT/OT/ST. She reports she only drives short distances. They have friends that assist with transportation or [place]. She reports that pt needs assistance with transportation when he is DC. Discussed DME. Informed her that the insurance won't pay for a scooter and a wheelchair. She reports her husband can choose between the scooter or the WC. She agrees with a HH aide. Met with pt and he prefers a scooter. Informed pt that he has to get the scooter. He can call [facility] for the scooter. Contacted Heather at [employer] for [facility] aide referral.  15:05 - Pt is requesting to see CM. Met with pt. He was on the phone with his wife. He reports that it may take a while for him to get a scooter and he prefers a wheelchair. They don't have a preference for a DME agency. Will contact [employer] for referral and they agreed. Informed them that [facility] will send a HH aide. Contacted Jermaine with [employer] for the DME referral. He accepted the referral.

## 2023-07-28 NOTE — Plan of Care (Signed)
  Problem: Education: Goal: Knowledge of General Education information will improve Description: Including pain rating scale, medication(s)/side effects and non-pharmacologic comfort measures Outcome: Progressing   Problem: Clinical Measurements: Goal: Will remain free from infection Outcome: Progressing   Problem: Clinical Measurements: Goal: Respiratory complications will improve Outcome: Progressing   Problem: Clinical Measurements: Goal: Cardiovascular complication will be avoided Outcome: Progressing   Problem: Nutrition: Goal: Adequate nutrition will be maintained Outcome: Progressing

## 2023-07-28 NOTE — Progress Notes (Signed)
 [facility]                                                                               Nik Gorrell, is a 89 y.o. male, DOB - [birth year], ZOX:[id number] Admit date - 07/24/2023    Outpatient Primary MD for the patient is Manson Passey, Burman Nieves, NP  LOS - 2  days    Brief summary    Zachary Underwood is a 89 y.o. 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 Underwent full TIA work up. He Is medically stable for discharge. Plan for discharge home with home health with DME.   Assessment & Plan    Assessment and Plan:   Intermittent slurred speech with prior h/o CVA.  Admitted for evaluation of acute stroke/ TIA.  MRI brain without contrast is negative for acute stroke.  Echocardiogram reviewed with the patient.  LDL is 50 A1c is 4.9 CT angiogram showed. evidence of prior left cerebellum CVA.  No LVO  Restarted xarelto.     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  Outpatient follow up In clinic.     Right knee pain:  Worse than last week.  In the setting of fall.  Patient reports that he went to see an orthopedics and underwent intraarticular cortisol injection.  MRI of the right knee ordered  showed medial condyle fracture. Orthopedics consulted and recommendations given to keep it nwb and outpatient follow up with ortho in 2 to 3 weeks.  Patient would like ot know if he can get a scooter/ wheelchair and go home instead of going to SNF.    Permanent atrial fib on  xarelto Rate controlled.   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    Hypertension:  Optimal. No change in meds.    BPH resume home meds.    Estimated body mass index is 28.13 kg/m as calculated from the following:   Height as of this encounter: 5\' 8"  (1.727 m).   Weight as of this encounter: 83.9 kg.  Code Status: full code.  DVT Prophylaxis:  xarelto.  Rivaroxaban (XARELTO) tablet 15 mg   Level of Care: Level of care: Telemetry Medical Family Communication: none at bedside.   Disposition Plan:     Remains inpatient appropriate:  SNF placement.   Procedures:  MRI brain Echo Mri of the right knee  Consultants:   Neurology Vascular surgery.   Antimicrobials:   Anti-infectives (From admission, onward)    None        Medications  Scheduled Meds:  atorvastatin  5 mg Oral QHS   feeding supplement  237 mL Oral BID BM   lidocaine  1 patch Transdermal Q24H   rivaroxaban  15 mg Oral Q supper  tamsulosin  0.4 mg Oral QHS   Continuous Infusions:   PRN Meds:.labetalol, melatonin, mouth rinse, oxyCODONE, polyethylene glycol, prochlorperazine    Subjective:   Zachary Underwood was seen and examined today.  2 soft BM yesterday and today. No diarrhea. No nausea, or vomiting.  Objective:   Vitals:   07/27/23 2044 07/28/23 0426 07/28/23 1100 07/28/23 1357  BP: (!) 144/69 (!) 128/59 (!) 159/61 (!) 148/71  Pulse: (!) 57 (!) 46 (!) 57 63  Resp: 16 18  20   Temp: 98.8 F (37.1 C) 98.1 F (36.7 C) (!) 97.5 F (36.4 C) 98.4 F (36.9 C)  TempSrc: Oral Oral Oral Oral  SpO2: 97% 100% 97% 97%  Weight:      Height:       No intake or output data in the 24 hours ending 07/28/23 1743  Filed Weights   07/24/23 2128  Weight: 83.9 kg     Exam General exam: Appears calm and comfortable  Respiratory system: Clear to auscultation. Respiratory effort normal. Cardiovascular system: S1 & S2 heard, RRR. No JVD,   Gastrointestinal system: Abdomen is nondistended, soft and nontender. Central nervous system: Alert and oriented. No focal neurological deficits. Extremities: Symmetric 5 x 5 power. Skin: No rashes,  Psychiatry: Mood & affect appropriate.      Data Reviewed:  I have personally reviewed following labs and imaging studies   CBC Lab Results  Component Value Date   WBC 6.9 07/24/2023   RBC 4.23 07/24/2023   HGB 13.7 07/24/2023   HCT 40.9 07/24/2023   MCV 96.7 07/24/2023   MCH 32.4 07/24/2023   PLT 156 07/24/2023   MCHC 33.5 07/24/2023   RDW 13.9 07/24/2023   LYMPHSABS 0.7 07/24/2023   MONOABS 0.5 07/24/2023   EOSABS 0.2 07/24/2023   BASOSABS 0.1 07/24/2023     Last metabolic panel Lab Results  Component Value Date   NA 139 07/25/2023   K 3.7 07/25/2023   CL 107 07/25/2023   CO2 27 07/25/2023   BUN 25 (H) 07/25/2023   CREATININE 1.03 07/25/2023   GLUCOSE 89 07/25/2023   GFRNONAA >60 07/25/2023   CALCIUM 9.8 07/25/2023   PROT 7.0 07/24/2023   ALBUMIN 4.3 07/24/2023   BILITOT 1.0 07/24/2023   ALKPHOS 98 07/24/2023   AST 19 07/24/2023   ALT 14 07/24/2023   ANIONGAP 5 07/25/2023    CBG (last 3)  No results for input(s): "GLUCAP" in the last 72 hours.     Coagulation Profile: Recent Labs  Lab 07/24/23 1242  INR 1.6*     Radiology Studies: No results found.      Kathlen Mody M.D. [facility] 07/28/2023, 5:43 PM  Available via Epic secure chat 7am-7pm After 7 pm, please refer to night coverage provider listed on amion.

## 2023-07-28 NOTE — Plan of Care (Signed)
  Problem: Education: Goal: Knowledge of General Education information will improve Description: Including pain rating scale, medication(s)/side effects and non-pharmacologic comfort measures Outcome: Progressing   Problem: Health Behavior/Discharge Planning: Goal: Ability to manage health-related needs will improve Outcome: Progressing   Problem: Clinical Measurements: Goal: Ability to maintain clinical measurements within normal limits will improve Outcome: Progressing Goal: Will remain free from infection Outcome: Progressing Goal: Diagnostic test results will improve Outcome: Progressing Goal: Respiratory complications will improve Outcome: Progressing Goal: Cardiovascular complication will be avoided Outcome: Progressing   Problem: Activity: Goal: Risk for activity intolerance will decrease Outcome: Progressing   Problem: Nutrition: Goal: Adequate nutrition will be maintained Outcome: Progressing   Problem: Coping: Goal: Level of anxiety will decrease Outcome: Progressing   Problem: Elimination: Goal: Will not experience complications related to bowel motility Outcome: Progressing Goal: Will not experience complications related to urinary retention Outcome: Progressing   Problem: Pain Managment: Goal: General experience of comfort will improve and/or be controlled Outcome: Progressing   Problem: Safety: Goal: Ability to remain free from injury will improve Outcome: Progressing   Problem: Skin Integrity: Goal: Risk for impaired skin integrity will decrease Outcome: Progressing   Problem: Education: Goal: Knowledge of disease or condition will improve Outcome: Progressing Goal: Knowledge of secondary prevention will improve (MUST DOCUMENT ALL) Outcome: Progressing Goal: Knowledge of patient specific risk factors will improve (DELETE if not current risk factor) Outcome: Progressing   Problem: Ischemic Stroke/TIA Tissue Perfusion: Goal: Complications of  ischemic stroke/TIA will be minimized Outcome: Progressing

## 2023-07-29 DIAGNOSIS — E785 Hyperlipidemia, unspecified: Secondary | ICD-10-CM | POA: Diagnosis not present

## 2023-07-29 DIAGNOSIS — G459 Transient cerebral ischemic attack, unspecified: Secondary | ICD-10-CM | POA: Diagnosis not present

## 2023-07-29 DIAGNOSIS — I482 Chronic atrial fibrillation, unspecified: Secondary | ICD-10-CM

## 2023-07-29 DIAGNOSIS — I10 Essential (primary) hypertension: Secondary | ICD-10-CM | POA: Diagnosis not present

## 2023-07-29 DIAGNOSIS — I6523 Occlusion and stenosis of bilateral carotid arteries: Secondary | ICD-10-CM | POA: Diagnosis not present

## 2023-07-29 NOTE — Progress Notes (Signed)
 Occupational Therapy Treatment Patient Details Name: Zachary Underwood MRN: [id number] DOB: 29-Jul-1927 Today's Date: 07/29/2023   History of present illness Pt is a 89 y/o male presenting on 07/24/23 with slurred speech, falls, and bilat hand numbness. MRI negative for acute abnormalities, suspect TIA. MRI of knee finding subchondral insufficiency fracture of the central weight-bearing  aspect of the medial femoral condyle with associated marrow edema on 4/3. NWB status placed on 4/4.  WBAT if in NO pain order placed 4/7.  PMH includes: afib, h/o AAA s/p repair, OA, HTN, CHF, hx of CVA.   OT comments  Patient supine in bed and agreeable to OT session.  Verbal order prior to session, and noted orders adjusted in chart, from Charma Igo, PA-C to R LE WBAT as long as NO pain. Discussed MD orders and pt reports preference to try placing weight on LE. Offered alternate technique of lateral scoots, pt requiring mod assist and ultimately declined this type of transfer. Patient able to stand and ambulate with no pain, per his report (assessed throughout session), using RW with min guard assist. Mod assist for LB ADLs, min guard for toileting.  Based on performance today, recommend continued HHOT services at dc. Will follow acutely.       If plan is discharge home, recommend the following:  Assistance with cooking/housework;A little help with bathing/dressing/bathroom;A little help with walking and/or transfers   Equipment Recommendations  None recommended by OT    Recommendations for Other Services      Precautions / Restrictions Precautions Precautions: Fall Recall of Precautions/Restrictions: Intact Restrictions Weight Bearing Restrictions Per Provider Order: Yes RLE Weight Bearing Per Provider Order: Weight bearing as tolerated Other Position/Activity Restrictions: with NO pain       Mobility Bed Mobility Overal bed mobility: Modified Independent                   Transfers Overall transfer level: Needs assistance Equipment used: Rolling walker (2 wheels), 1 person hand held assist Transfers: Sit to/from Stand, Bed to chair/wheelchair/BSC Sit to Stand: Contact guard assist   Squat pivot transfers: Mod assist       General transfer comment: Discussed in depth about recommendations from ortho, offered techniques (lateral scoot) to utilize NWB, but pt declined after attempt.  Pt reports he is not in any pain and would like to stand.  He is able to stand with min guard, continues to deny pain throughout session.     Balance Overall balance assessment: Needs assistance Sitting-balance support: No upper extremity supported, Feet supported Sitting balance-Leahy Scale: Normal     Standing balance support: Bilateral upper extremity supported, During functional activity Standing balance-Leahy Scale: Fair Standing balance comment: with WBAT and no pain, min guard for safety                           ADL either performed or assessed with clinical judgement   ADL Overall ADL's : Needs assistance/impaired                     Lower Body Dressing: Sit to/from stand;Moderate assistance   Toilet Transfer: Ambulation;Contact guard assist;Rolling walker (2 wheels)   Toileting- Clothing Manipulation and Hygiene: Contact guard assist;Sit to/from stand       Functional mobility during ADLs: Contact guard assist;Rolling walker (2 wheels);Cueing for safety General ADL Comments: discussed ortho orders of WBAT as long as in no pain.    Extremity/Trunk Assessment  Upper Extremity Assessment Upper Extremity Assessment: Overall WFL for tasks assessed   Lower Extremity Assessment Lower Extremity Assessment: Defer to PT evaluation        Vision       Perception     Praxis     Communication Communication Communication: Impaired Factors Affecting Communication: Hearing impaired   Cognition Arousal: Alert Behavior During  Therapy: WFL for tasks assessed/performed Cognition: No apparent impairments, Difficult to assess                               Following commands: Intact        Cueing   Cueing Techniques: Verbal cues  Exercises      Shoulder Instructions       General Comments Discussed with patient ortho recommendations, patient eager to stand and put weight on his R LE.  Pt denies pain throughout session.  Discussed with patient recommendations for dc, he is agreeable to recommendations.    Pertinent Vitals/ Pain       Pain Assessment Pain Assessment: No/denies pain Pain Intervention(s): Monitored during session (pt questioned throughout session and continously reports NO pain in R knee)  Home Living                                          Prior Functioning/Environment              Frequency  Min 2X/week        Progress Toward Goals  OT Goals(current goals can now be found in the care plan section)  Progress towards OT goals: Progressing toward goals  Acute Rehab OT Goals Patient Stated Goal: home OT Goal Formulation: With patient Time For Goal Achievement: 08/08/23 Potential to Achieve Goals: Good  Plan      Co-evaluation                 AM-PAC OT "6 Clicks" Daily Activity     Outcome Measure   Help from another person eating meals?: None Help from another person taking care of personal grooming?: A Little Help from another person toileting, which includes using toliet, bedpan, or urinal?: A Little Help from another person bathing (including washing, rinsing, drying)?: A Little Help from another person to put on and taking off regular upper body clothing?: A Little Help from another person to put on and taking off regular lower body clothing?: A Lot 6 Click Score: 18    End of Session Equipment Utilized During Treatment: Rolling walker (2 wheels)  OT Visit Diagnosis: History of falling (Z91.81);Unsteadiness on feet  (R26.81)   Activity Tolerance Patient tolerated treatment well;No increased pain   Patient Left in chair;with call bell/phone within reach;with chair alarm set   Nurse Communication Mobility status        Time: 8295-6213 OT Time Calculation (min): 23 min  Charges: OT General Charges $OT Visit: 1 Visit OT Treatments $Self Care/Home Management : 23-37 mins  Barry Brunner, OT Acute Rehabilitation Services Office [phone] Secure Chat Preferred     Zachary Underwood 07/29/2023, 1:25 PM

## 2023-07-29 NOTE — Discharge Instructions (Signed)
 He can be weight bearing as tolerated on the right lower extremity

## 2023-07-29 NOTE — Care Management Important Message (Signed)
 Important Message  Patient Details  Name: Zachary Underwood MRN: [id number] Date of Birth: [birth year]   Important Message Given:  Yes - Medicare IM     Dorena Bodo 07/29/2023, 3:11 PM

## 2023-07-29 NOTE — Progress Notes (Signed)
 Physical Therapy Treatment Patient Details Name: Zachary Underwood MRN: [id number] DOB: 1927-08-07 Today's Date: 07/29/2023   History of Present Illness Pt is a 89 y/o male presenting on 07/24/23 with slurred speech, falls, and bilat hand numbness. MRI negative for acute abnormalities, suspect TIA. MRI of knee finding subchondral insufficiency fracture of the central weight-bearing  aspect of the medial femoral condyle with associated marrow edema on 4/3. NWB status placed on 4/4.  PMH includes: afib, h/o AAA s/p repair, OA, HTN, CHF, hx of CVA.    PT Comments  Pt with fair tolerance to treatment today. Given pt NWB status and DC order, practiced WC transfers which pt required up to +2 Min/Mod A. Pt had great difficulty maintaining NWB status on RLE. Therapist foot under pt foot with patient placing full wgt through RLE with frequent verbal cues. Pt unable to clear the floor with LLE due to insufficient strength in LLE and bil UE. Despite constant education on NWB status pt stated "I am 89 years old, I am not a spring chicken anymore. I am going to put some weight on this leg!". Pt anticipates DC home today with resumption of HH services. PT will continue to follow.    If plan is discharge home, recommend the following: Two people to help with walking and/or transfers;Assistance with cooking/housework;Assist for transportation;Help with stairs or ramp for entrance   Can travel by private vehicle        Equipment Recommendations  Hospital bed;Hoyer lift;Wheelchair cushion (measurements PT);Wheelchair (measurements PT)    Recommendations for Other Services       Precautions / Restrictions Precautions Precautions: Fall Recall of Precautions/Restrictions: Intact Restrictions Weight Bearing Restrictions Per Provider Order: Yes RLE Weight Bearing Per Provider Order: Non weight bearing     Mobility  Bed Mobility Overal bed mobility: Modified Independent Bed Mobility: Supine to Sit, Sit to  Supine     Supine to sit: Modified independent (Device/Increase time) Sit to supine: Modified independent (Device/Increase time)        Transfers Overall transfer level: Needs assistance Equipment used: Rolling walker (2 wheels), 1 person hand held assist Transfers: Sit to/from Stand, Bed to chair/wheelchair/BSC Sit to Stand: Min assist, Mod assist, +2 safety/equipment   Step pivot transfers: Mod assist, +2 physical assistance, +2 safety/equipment Squat pivot transfers: +2 physical assistance, Min assist, +2 safety/equipment     General transfer comment: Pt had difficulty maintaining NWB status on RLE. Therapist foot under pt foot with patient placing full wgt through RLE with frequent verbal cues. Pt unable to clear the floor with LLE due to insufficient strength in LLE and bil UE. (no change from previous session.)    Ambulation/Gait               General Gait Details: unable to ambulate while maintaining updated WB status   Stairs             Wheelchair Mobility     Tilt Bed    Modified Rankin (Stroke Patients Only)       Balance Overall balance assessment: Needs assistance Sitting-balance support: No upper extremity supported, Feet supported Sitting balance-Leahy Scale: Normal     Standing balance support: Reliant on assistive device for balance, Bilateral upper extremity supported Standing balance-Leahy Scale: Zero Standing balance comment: Heavy UE support and 2 person assist to maintain NWB status on RLE  Communication Communication Communication: Impaired Factors Affecting Communication: Hearing impaired  Cognition Arousal: Alert Behavior During Therapy: WFL for tasks assessed/performed   PT - Cognitive impairments: No apparent impairments                         Following commands: Intact      Cueing Cueing Techniques: Verbal cues, Tactile cues  Exercises      General Comments  General comments (skin integrity, edema, etc.): VSS      Pertinent Vitals/Pain Pain Assessment Pain Assessment: Faces Faces Pain Scale: Hurts a little bit Pain Location: R knee Pain Descriptors / Indicators: Aching, Sore Pain Intervention(s): Monitored during session    Home Living                          Prior Function            PT Goals (current goals can now be found in the care plan section) Progress towards PT goals: Progressing toward goals    Frequency    Min 2X/week      PT Plan      Co-evaluation              AM-PAC PT "6 Clicks" Mobility   Outcome Measure  Help needed turning from your back to your side while in a flat bed without using bedrails?: None Help needed moving from lying on your back to sitting on the side of a flat bed without using bedrails?: None Help needed moving to and from a bed to a chair (including a wheelchair)?: A Lot Help needed standing up from a chair using your arms (e.g., wheelchair or bedside chair)?: A Lot Help needed to walk in hospital room?: Total Help needed climbing 3-5 steps with a railing? : Total 6 Click Score: 14    End of Session Equipment Utilized During Treatment: Gait belt Activity Tolerance: Patient tolerated treatment well Patient left: in bed;with call bell/phone within reach;with bed alarm set Nurse Communication: Mobility status PT Visit Diagnosis: Unsteadiness on feet (R26.81);Other abnormalities of gait and mobility (R26.89)     Time: 9563-8756 PT Time Calculation (min) (ACUTE ONLY): 21 min  Charges:    $Therapeutic Activity: 8-22 mins PT General Charges $$ ACUTE PT VISIT: 1 Visit                     Zachary Underwood, PT, DPT Acute Rehab Services [phone]    Zachary Underwood 07/29/2023, 12:04 PM

## 2023-07-29 NOTE — TOC Transition Note (Addendum)
 Transition of Care [facility]) - Discharge Note   Patient Details  Name: Zachary Underwood MRN: [id number] Date of Birth: [birth year]  [facility] CM/SW Contact:  Kermit Balo, RN Phone Number: 07/29/2023, 10:09 AM   Clinical Narrative:     Pt is discharging home with resumption of home health services through [facility]. Information on the AVS.  Wheelchair ordered through [employer] and will be delivered to the room.  Pt states he has transportation arranged for home.  1047: Wife asking for motorized wheelchair, ramp, chair lift. CM will send the orders to the [place] to be filled.   Final next level of care: Home w Home Health Services Barriers to Discharge: No Barriers Identified   Patient Goals and CMS Choice   CMS Medicare.gov Compare Post Acute Care list provided to:: Patient Choice offered to / list presented to : Patient      Discharge Placement                       Discharge Plan and Services Additional resources added to the After Visit Summary for     Discharge Planning Services: CM Consult Post Acute Care Choice: Home Health          DME Arranged: Lightweight manual wheelchair with seat cushion DME Agency: [employer] Date DME Agency Contacted: 07/29/23   Representative spoke with at DME Agency: [facility] HH Arranged: PT, OT, Nurse's Aide, Speech Therapy HH Agency: [facility] Date HH Agency Contacted: 07/25/23   Representative spoke with at [facility] Agency: Adele Dan  Social Drivers of Health (SDOH) Interventions SDOH Screenings   Food Insecurity: No Food Insecurity (07/24/2023)  Housing: Low Risk  (07/24/2023)  Transportation Needs: Unmet Transportation Needs (07/24/2023)  Utilities: Not At Risk (07/24/2023)  Depression (PHQ2-9): Low Risk  (09/04/2021)  Financial Resource Strain: Low Risk  (09/04/2021)  Social Connections: Unknown (07/24/2023)  Stress: No Stress Concern Present (09/04/2021)  Tobacco Use: Medium Risk (07/24/2023)      Readmission Risk Interventions     No data to display

## 2023-07-29 NOTE — Progress Notes (Signed)
    Durable Medical Equipment  (From admission, onward)           Start     Ordered   07/28/23 0936  For home use only DME lightweight manual wheelchair with seat cushion  Once       Comments: Patient suffers from generalized weakness / medial condyle fracture on the right which impairs their ability to perform daily activities like bathing, dressing, feeding, grooming, and toileting in the home.  A cane, crutch, or walker will not resolve  issue with performing activities of daily living. A wheelchair will allow patient to safely perform daily activities. Patient is not able to propel themselves in the home using a standard weight wheelchair due to general weakness. Patient can self propel in the lightweight wheelchair. Length of need 6 months . Accessories: elevating leg rests (ELRs), wheel locks, extensions and anti-tippers. He needs to be NWB on the right knee.   07/28/23 8657

## 2023-07-30 NOTE — Discharge Summary (Addendum)
 Physician Discharge Summary   Patient: Zachary Underwood MRN: [id number] DOB: Mar 16, 1928  Admit date:     07/24/2023  Discharge date: 07/29/2023  Discharge Physician: Kathlen Mody   PCP: Estevan Oaks, NP   Recommendations at discharge:  Recommend outpatient follow up with PCP, neurology, vascular surgery and orthopedics as scheduled/ recommended.  Please follow up with a cbc and bmp in one week.   Discharge Diagnoses: Principal Problem:  TIA (transient ischemic attack) Right  Knee Pain/ Subchondral insufficiency fracture of the central weight-bearing aspect of the medial femoral condyle with associated marrow edema. Permanent atrial fib on Xarelto Hypertension Hyperlipidemia Bilateral ICA stenosis.     Hospital Course: Zachary Underwood is a 89 y.o. 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 Underwent full TIA work up. He Is medically stable for discharge. Plan for discharge home with home health with DME.   Assessment and Plan:    Intermittent slurred speech with prior h/o CVA.  Admitted for evaluation of acute stroke/ TIA.  MRI brain without contrast is negative for acute stroke.  Echocardiogram reviewed with the patient.  LDL is 50 A1c is 4.9 CT angiogram showed. evidence of prior left cerebellum CVA.  No LVO  Restarted xarelto.        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  Recommended to add aspirin to statin.  Outpatient follow up In clinic.        Right knee pain/   medial condyle fracture on the right. Worse than last week.  In the setting of fall.  Patient reports that he went to see an orthopedics and underwent intraarticular cortisol injection.  MRI of the right knee ordered  showed medial condyle fracture. Orthopedics consulted and recommendations given to keep it nwb and outpatient follow up with ortho in 2 to 3 weeks.  Patient would like ot know if he can get a scooter/ wheelchair and go home instead of going to SNF.      Permanent atrial fib on xarelto Rate controlled.    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      Hypertension:  Optimal. No change in meds.      BPH resume home meds.        Consultants:  Neurology Vascular surgery.   Procedures performed:  MRI brain Echo Mri of the right knee Disposition: Home Diet recommendation:  Discharge Diet Orders (From admission, onward)     Start     Ordered   07/29/23 0000  Diet - low sodium heart healthy        07/29/23 0934   07/26/23 0000  Diet - low sodium heart healthy        07/26/23 1341           Cardiac diet DISCHARGE MEDICATION: Allergies as of 07/29/2023       Reactions   Short Ragweed Pollen Ext Other (See Comments)   Unknown    Beef-derived Drug Products Other (See Comments)   Patient does not eat beef    Eliquis [apixaban] Itching  Lasix [furosemide] Itching   Other Other (See Comments), Hypertension   Fusid- Legs became very swollen   Tylenol [acetaminophen] Hives        Medication List     STOP taking these medications    HYDROcodone-acetaminophen 5-325 MG tablet Commonly known as: NORCO/VICODIN   ibuprofen 200 MG tablet Commonly known as: ADVIL       TAKE these medications    aspirin EC 81 MG tablet Take 1 tablet (81 mg total) by mouth daily. Swallow whole.   atorvastatin 10 MG tablet Commonly known as: LIPITOR Take 5 mg by mouth at bedtime.   candesartan 4 MG tablet Commonly  known as: ATACAND Take 0.5 tablets by mouth at bedtime.   feeding supplement Liqd Take 237 mLs by mouth 2 (two) times daily between meals.   ferrous sulfate 325 (65 FE) MG EC tablet Take 325 mg by mouth daily.   lidocaine 5 % Commonly known as: LIDODERM Place 1 patch onto the skin daily. Remove & Discard patch within 12 hours or as directed by MD apply to knee   metolazone 2.5 MG tablet Commonly known as: ZAROXOLYN Take 1 tablet (2.5 mg total) by mouth daily. What changed: when to take this   multivitamin with minerals tablet Take 0.5 tablets by mouth daily.   tamsulosin 0.4 MG Caps capsule Commonly known as: FLOMAX Take 0.4 mg by mouth at bedtime.   traMADol 50 MG tablet Commonly known as: ULTRAM Take 50 mg by mouth daily as needed for moderate pain (pain score 4-6) or severe pain (pain score 7-10).   Xarelto 15 MG Tabs tablet Generic drug: Rivaroxaban Take 15 mg by mouth daily with supper.        Follow-up Information     [facility] Follow up.   Why: [facility] will contact you for the next home visit Contact information: [phone]        Levert Feinstein, MD. Schedule an appointment as soon as possible for a visit in 1 month(s).   Specialty: Neurology Contact information: [address] [place] [address] [phone]         [facility] Follow up.   Why: [facility] will provide the wheelchair. Contact information: [address] [place] [address] [phone]         Durene Romans, MD Follow up in 4 week(s).   Specialty: Orthopedic Surgery Contact information: [address] [place] [address] [place] [address] [phone]                Discharge Exam: Ceasar Mons Weights   07/24/23 2128  Weight: 83.9 kg   General exam: Appears calm and comfortable  Respiratory system: Clear to auscultation. Respiratory effort normal. Cardiovascular system: S1 & S2 heard, RRR. No  JVD, Gastrointestinal system: Abdomen is nondistended, soft and nontender.  Central nervous system: Alert and oriented.  Extremities: right knee tenderness improved.  Skin: No rashes, Psychiatry: Mood & affect appropriate.     Condition at discharge: good  The results of significant diagnostics from this hospitalization (including imaging, microbiology, ancillary and laboratory) are listed below for reference.   Imaging Studies: MR KNEE RIGHT WO CONTRAST Result Date: 07/25/2023 CLINICAL DATA:  Right knee pain for 2 weeks. EXAM: MRI OF THE RIGHT KNEE WITHOUT CONTRAST TECHNIQUE: Multiplanar, multisequence MR imaging of the knee was performed. No intravenous contrast was administered. COMPARISON:  Right knee radiographs dated 07/24/2023. FINDINGS: MENISCI Medial: Degeneration of the body and posterior horn of  the medial meniscus with edema at the posteromedial meniscocapsular junction. Lateral: Intact. LIGAMENTS Cruciates: ACL and PCL are intact. Collaterals: Medial collateral ligament is intact. Lateral collateral ligament complex is intact. CARTILAGE Patellofemoral: Diffuse high-grade thinning of the patellar and femoral trochlear articular cartilage with subchondral cystic changes and marrow edema. Medial: Complete loss of the central weight-bearing articular cartilage of the medial femoral condyle and severe thinning of the medial tibial plateau articular cartilage. Lateral: Mild chondral thinning of the lateral compartment articular cartilage. JOINT: Moderate joint effusion with synovitis. Normal Hoffa's fat-pad. POPLITEAL FOSSA: Popliteus tendon is intact. Ruptured Baker's cyst. EXTENSOR MECHANISM: Intact quadriceps tendon. Intact patellar tendon. BONES: Subchondral insufficiency fracture with mild depression of the central weight-bearing aspect of the medial femoral condyle with associated marrow edema. No displaced fragment. No suspicious marrow replacing lesion. Tricompartmental osteophytosis.  Other: Muscles are within normal limits for age. IMPRESSION: 1. Subchondral insufficiency fracture of the central weight-bearing aspect of the medial femoral condyle with associated marrow edema. 2. Degeneration of the body and posterior horn of the medial meniscus with posteromedial meniscocapsular junction injury. 3. 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 4. Moderate knee joint effusion with synovitis. Electronically Signed   By: Hart Robinsons M.D.   On: 07/25/2023 19:50   ECHOCARDIOGRAM LIMITED Result Date: 07/25/2023    ECHOCARDIOGRAM LIMITED REPORT   Patient Name:   Zachary Underwood Date of Exam: 07/25/2023 Medical Rec #:  [id number]       Height:       68.0 in Accession #:    [id number]      Weight:       185.0 lb Date of Birth:  1927/11/27       BSA:          1.977 m Patient Age:    89 years        BP:           128/73 mmHg Patient Gender: M               HR:           68 bpm. Exam Location:  Inpatient Procedure: 2D Echo, Color Doppler and Cardiac Doppler (Both Spectral and Color            Flow Doppler were utilized during procedure). Indications:     Stroke I63.9  History:         Patient has prior history of Echocardiogram examinations, most                  recent 04/05/2023. Stroke; Risk Factors:Hypertension.  Sonographer:     Harriette Bouillon RDCS Referring Phys:  [id number] Oliver Pila HALL Diagnosing Phys: Zoila Shutter MD IMPRESSIONS  1. Left ventricular ejection fraction, by estimation, is >75%. Left ventricular ejection fraction by PLAX is 78 %. The left ventricle has hyperdynamic function. The left ventricle has no regional wall motion abnormalities. There is moderate left ventricular hypertrophy. Left ventricular diastolic function could not be evaluated.  2. Left atrial size was severely dilated.  3. Right atrial size was severely dilated.  4. The mitral valve is degenerative. Trivial mitral valve regurgitation.  Severe mitral annular calcification.  5. There is moderate calcification of the aortic valve. Suspect at least moderate aortic valve stenosis, however the valve was not completely evaluated during this study.  6. Aortic dilatation noted. There is mild dilatation of the ascending aorta, measuring 41 mm.  7. Tricuspid regurgitation signal is  inadequate for assessing PA pressure.  8. The inferior vena cava is normal in size with greater than 50% respiratory variability, suggesting right atrial pressure of 3 mmHg. Comparison(s): Changes from prior study are noted. 04/05/2023: LVEF 65-70%. Conclusion(s)/Recommendation(s): The aortic valve was incompletely evaluated. It appears to be moderate to severely calcified. The prior study in 03/2023 demonstrate moderate AS. Would recommend repeat limited imaging to re-evaluate the valve. FINDINGS  Left Ventricle: Left ventricular ejection fraction, by estimation, is >75%. Left ventricular ejection fraction by PLAX is 78 %. The left ventricle has hyperdynamic function. The left ventricle has no regional wall motion abnormalities. The left ventricular internal cavity size was normal in size. There is moderate left ventricular hypertrophy. Left ventricular diastolic function could not be evaluated. Left ventricular diastolic function could not be evaluated due to atrial fibrillation. Right Ventricle: Tricuspid regurgitation signal is inadequate for assessing PA pressure. Left Atrium: Left atrial size was severely dilated. Right Atrium: Right atrial size was severely dilated. Mitral Valve: The mitral valve is degenerative in appearance. There is moderate calcification of the anterior and posterior mitral valve leaflet(s). Severe mitral annular calcification. Trivial mitral valve regurgitation. Tricuspid Valve: The tricuspid valve is grossly normal. Tricuspid valve regurgitation is not demonstrated. Aortic Valve: There is moderate calcification of the aortic valve. Moderate aortic  stenosis is present. Aorta: Aortic dilatation noted. There is mild dilatation of the ascending aorta, measuring 41 mm. Venous: The inferior vena cava is normal in size with greater than 50% respiratory variability, suggesting right atrial pressure of 3 mmHg. IAS/Shunts: No atrial level shunt detected by color flow Doppler. LEFT VENTRICLE PLAX 2D LV EF:         Left ventricular ejection fraction by PLAX is 78 %. LVIDd:         3.10 cm LVIDs:         1.70 cm LV PW:         1.40 cm LV IVS:        1.30 cm LVOT diam:     2.10 cm LVOT Area:     3.46 cm  RIGHT ATRIUM           Index RA Area:     34.60 cm RA Volume:   131.00 ml 66.26 ml/m   AORTA Ao Root diam: 3.20 cm Ao Asc diam:  4.10 cm  SHUNTS Systemic Diam: 2.10 cm Zoila Shutter MD Electronically signed by Zoila Shutter MD Signature Date/Time: 07/25/2023/5:10:30 PM    Final (Updated)    MR BRAIN WO CONTRAST Result Date: 07/25/2023 CLINICAL DATA:  Transient ischemic attack EXAM: MRI HEAD WITHOUT CONTRAST TECHNIQUE: Multiplanar, multiecho pulse sequences of the brain and surrounding structures were obtained without intravenous contrast. COMPARISON:  08/22/2021 FINDINGS: Brain: No acute infarct, mass effect or extra-axial collection. No acute or chronic hemorrhage. Normal white matter signal, parenchymal volume and CSF spaces. The midline structures are normal. Old left cerebellar infarct. Vascular: Normal flow voids. Skull and upper cervical spine: Normal calvarium and skull base. Visualized upper cervical spine and soft tissues are normal. Sinuses/Orbits:No paranasal sinus fluid levels or advanced mucosal thickening. No mastoid or middle ear effusion. Normal orbits. IMPRESSION: 1. No acute intracranial abnormality. 2. Old left cerebellar infarct. Electronically Signed   By: Deatra Robinson M.D.   On: 07/25/2023 00:02   CT Angio Head Neck W WO CM Result Date: 07/24/2023 CLINICAL DATA:  Transient ischemic attack, dizziness and slurred speech around 8 a.m. this morning.  EXAM: CT ANGIOGRAPHY HEAD AND NECK WITH  AND WITHOUT CONTRAST TECHNIQUE: Multidetector CT imaging of the head and neck was performed using the standard protocol during bolus administration of intravenous contrast. Multiplanar CT image reconstructions and MIPs were obtained to evaluate the vascular anatomy. Carotid stenosis measurements (when applicable) are obtained utilizing NASCET criteria, using the distal internal carotid diameter as the denominator. RADIATION DOSE REDUCTION: This exam was performed according to the departmental dose-optimization program which includes automated exposure control, adjustment of the mA and/or kV according to patient size and/or use of iterative reconstruction technique. CONTRAST:  75mL OMNIPAQUE IOHEXOL 350 MG/ML SOLN COMPARISON:  CTA head and neck 08/23/2021, MRI head 08/22/2021. FINDINGS: CT HEAD FINDINGS Brain: No acute intracranial hemorrhage. No CT evidence of acute infarct. 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 Nonspecific hypoattenuation in the periventricular and subcortical white matter favored to reflect chronic microvascular ischemic changes. No edema, mass effect, or midline shift. The basilar cisterns are patent. Ventricles: Prominence of the ventricles suggestive of underlying parenchymal volume loss. Vascular: No hyperdense vessel. Intracranial atherosclerotic calcifications noted. Skull: No acute or aggressive finding. Sinuses/orbits: The visualized paranasal sinuses are clear. Orbits are symmetric. Other: Mastoid air cells are clear. CTA NECK FINDINGS Aortic arch: Standard configuration of the aortic arch. Moderate atherosclerosis of the visualized aortic arch. The distal ascending aorta/proximal aortic arch measures of 4.1 cm in diameter. Imaged portion shows no evidence of dissection. No significant stenosis of the major arch vessel origins. Pulmonary arteries: As permitted by contrast timing, there are no filling defects in the visualized pulmonary arteries.  Subclavian arteries: The subclavian arteries are patent bilaterally. Right carotid system: Patent from the origin to the skull base. Mild tortuosity of the proximal common carotid artery. Bulky calcified atherosclerosis at the carotid bifurcation extending into the proximal cervical ICA with increased circumferential calcification compared to the prior study. There is approximately 70% stenosis of the proximal right cervical ICA increased from prior. Left carotid system: Patent from the origin to the skull base. Mild tortuosity of the common carotid artery. Bulky calcified atherosclerosis at the carotid bifurcation resulting in approximately 70% stenosis of the proximal left cervical ICA overall similar to prior. Vertebral arteries: Right vertebral artery is dominant. No evidence of dissection, stenosis (50% or greater), or occlusion. Atherosclerosis involving the bilateral vertebral artery origins resulting in mild stenosis similar to prior. Skeleton: No acute findings. Degenerative changes in the cervical spine. Trace anterolisthesis of C5 on C6. Sequelae of median sternotomy. Other neck: The visualized airway is patent. No cervical lymphadenopathy. Upper chest: Visualized lung apices are clear. Review of the MIP images confirms the above findings CTA HEAD FINDINGS ANTERIOR CIRCULATION: The intracranial ICAs are patent bilaterally. Atherosclerosis of the carotid siphons resulting in mild stenosis. There is additional moderate stenosis of the right supraclinoid ICA which appears slightly increased from prior. No significant stenosis, proximal occlusion, aneurysm, or vascular malformation. MCAs: The middle cerebral arteries are patent bilaterally. ACAs: The anterior cerebral arteries are patent bilaterally. POSTERIOR CIRCULATION: No significant stenosis, proximal occlusion, aneurysm, or vascular malformation. PCAs: Patent bilaterally.  Fetal origin of the left PCA. Pcomm: Visualized on the left. SCAs: The superior  cerebellar arteries are patent bilaterally. Basilar artery: Patent AICAs: Patent PICAs: Patent Vertebral arteries: Patent bilaterally. Atherosclerosis of the right V4 segment resulting in mild stenosis. Venous sinuses: As permitted by contrast timing, patent. Anatomic variants: Fetal origin of the left PCA. Review of the MIP images confirms the above findings IMPRESSION: No large vessel occlusion. No CT evidence of acute intracranial abnormality. Multifocal atherosclerosis as above,  slightly increased since 2023. 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 Approximately 70% stenosis of the proximal left cervical ICA, overall similar to prior. Focal moderate stenosis of the right supraclinoid ICA, increased from prior. Moderate atherosclerosis of the aortic arch. Prominence of the distal ascending aorta/proximal arch measuring up to 4.1 cm, similar to prior. Chronic microvascular ischemic changes. 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 Electronically Signed   By: Emily Filbert M.D.   On: 07/24/2023 15:15   DG Knee Right Port Result Date: 07/24/2023 CLINICAL DATA:  Right knee pain for several weeks. EXAM: PORTABLE RIGHT KNEE - 1-2 VIEW COMPARISON:  None Available. FINDINGS: No evidence of fracture, dislocation, or joint effusion. Moderate narrowing of medial joint space is noted as well as patellofemoral space. Soft tissues are unremarkable. IMPRESSION: Moderate degenerative joint disease as noted above. No acute abnormality seen. Electronically Signed   By: Lupita Raider M.D.   On: 07/24/2023 14:43    Microbiology: No results found for this or any previous visit.  Labs: CBC: Recent Labs  Lab 07/24/23 1242  WBC 6.9  NEUTROABS 5.6  HGB 13.7  HCT 40.9  MCV 96.7  PLT 156   Basic Metabolic Panel: Recent Labs  Lab 07/24/23 1242 07/25/23 0657  NA 140 139  K 3.9 3.7  CL 104 107  CO2 28 27  GLUCOSE 91 89  BUN 27* 25*  CREATININE 1.20 1.03  CALCIUM 10.3 9.8   MG  --  1.8   Liver Function Tests: Recent Labs  Lab 07/24/23 1242  AST 19  ALT 14  ALKPHOS 98  BILITOT 1.0  PROT 7.0  ALBUMIN 4.3   CBG: Recent Labs  Lab 07/24/23 1226  GLUCAP 109*    Discharge time spent: 45 minutes  Signed: Kathlen Mody, MD [facility] 07/30/2023

## 2023-08-24 ENCOUNTER — Other Ambulatory Visit: Payer: Self-pay

## 2023-08-24 ENCOUNTER — Encounter (HOSPITAL_COMMUNITY): Payer: Self-pay | Admitting: Emergency Medicine

## 2023-08-24 ENCOUNTER — Emergency Department (HOSPITAL_COMMUNITY)
Admission: EM | Admit: 2023-08-24 | Discharge: 2023-08-24 | Disposition: A | Discharge status: Home / Self Care | Attending: Student | Admitting: Student

## 2023-08-24 DIAGNOSIS — Z79899 Other long term (current) drug therapy: Secondary | ICD-10-CM | POA: Insufficient documentation

## 2023-08-24 DIAGNOSIS — R040 Epistaxis: Secondary | ICD-10-CM | POA: Insufficient documentation

## 2023-08-24 DIAGNOSIS — Z7982 Long term (current) use of aspirin: Secondary | ICD-10-CM | POA: Insufficient documentation

## 2023-08-24 DIAGNOSIS — I10 Essential (primary) hypertension: Secondary | ICD-10-CM | POA: Diagnosis not present

## 2023-08-24 DIAGNOSIS — Z7901 Long term (current) use of anticoagulants: Secondary | ICD-10-CM | POA: Insufficient documentation

## 2023-08-24 LAB — CBC
HCT: 36.6 % — ABNORMAL LOW (ref 39.0–52.0)
Hemoglobin: 12 g/dL — ABNORMAL LOW (ref 13.0–17.0)
MCH: 31.1 pg (ref 26.0–34.0)
MCHC: 32.8 g/dL (ref 30.0–36.0)
MCV: 94.8 fL (ref 80.0–100.0)
Platelets: 201 10*3/uL (ref 150–400)
RBC: 3.86 MIL/uL — ABNORMAL LOW (ref 4.22–5.81)
RDW: 13.4 % (ref 11.5–15.5)
WBC: 9.1 10*3/uL (ref 4.0–10.5)
nRBC: 0 % (ref 0.0–0.2)

## 2023-08-24 MED ORDER — OXYMETAZOLINE HCL 0.05 % NA SOLN
1.0000 | Freq: Once | NASAL | Status: DC
Start: 2023-08-24 — End: 2023-08-24
  Filled 2023-08-24: qty 30

## 2023-08-24 MED ORDER — OXYMETAZOLINE HCL 0.05 % NA SOLN
1.0000 | Freq: Once | NASAL | Status: DC
Start: 2023-08-24 — End: 2023-08-24

## 2023-08-24 NOTE — ED Provider Notes (Signed)
 [place] [facility]ERG[facility] DEPARTMENT AT [facility] Provider Note   CSN: [id number] Arrival date & time: 08/24/23  1037     History  Chief Complaint  Patient presents with   Epistaxis    Zachary Underwood is a 89 y.o. male with medical history of cardiac arrhythmia, hypertension, A-fib on Eliquis .  Patient presents to ED for evaluation of epistaxis.  The patient reports that he has had a nosebleed since last night.  Denies any trauma to account for this nosebleed.  Denies any change to his activities or habits to account for nosebleed.  Denies chronic nosebleeds.  Reports that he does take blood thinner Eliquis  for atrial fibrillation.  Reports that his nose was bleeding all through the night into this morning.  He reports that it did stop at 1 point with it began again.  He is here with his wife concerned and wondering why his nose was bleeding.  No other concerns.  No chest pain, shortness of breath, lightheadedness, dizziness, weakness.  No nausea or vomiting.  No abdominal pain.  No active bleeding at this time.   Epistaxis      Home Medications Prior to Admission medications   Medication Sig Start Date End Date Taking? Authorizing Provider  aspirin  EC 81 MG tablet Take 1 tablet (81 mg total) by mouth daily. Swallow whole. 07/26/23 07/25/24  Akula, Vijaya, MD  atorvastatin  (LIPITOR) 10 MG tablet Take 5 mg by mouth at bedtime. 06/07/23   [provider]  candesartan (ATACAND) 4 MG tablet Take 0.5 tablets by mouth at bedtime. 06/07/23   [provider]  feeding supplement (ENSURE ENLIVE / ENSURE PLUS) LIQD Take 237 mLs by mouth 2 (two) times daily between meals. 07/26/23 10/24/23  Akula, Vijaya, MD  ferrous sulfate 325 (65 FE) MG EC tablet Take 325 mg by mouth daily.    [provider]  lidocaine  (LIDODERM ) 5 % Place 1 patch onto the skin daily. Remove & Discard patch within 12 hours or as directed by MD apply to knee    [provider]  metolazone   (ZAROXOLYN ) 2.5 MG tablet Take 1 tablet (2.5 mg total) by mouth daily. Patient taking differently: Take 2.5 mg by mouth in the morning. 08/24/21   Edsel Grace, MD  Multiple Vitamins-Minerals (MULTIVITAMIN WITH MINERALS) tablet Take 0.5 tablets by mouth daily.    [provider]  Rivaroxaban  (XARELTO ) 15 MG TABS tablet Take 15 mg by mouth daily with supper.    [provider]  tamsulosin  (FLOMAX ) 0.4 MG CAPS capsule Take 0.4 mg by mouth at bedtime.    [provider]  traMADol  (ULTRAM ) 50 MG tablet Take 50 mg by mouth daily as needed for moderate pain (pain score 4-6) or severe pain (pain score 7-10). 07/04/23   [provider]      Allergies    Short ragweed pollen ext, Beef-derived drug products, Eliquis  [apixaban ], Lasix [furosemide], Other, and Tylenol [acetaminophen]    Review of Systems   Review of Systems  HENT:  Positive for nosebleeds.   All other systems reviewed and are negative.   Physical Exam Updated Vital Signs BP (!) 119/54 (BP Location: Right Arm)   Pulse 74   Temp 98.3 F (36.8 C) (Oral)   Resp 16   SpO2 99%  Physical Exam Vitals and nursing note reviewed.  Constitutional:      General: He is not in acute distress.    Appearance: He is well-developed. He is not toxic-appearing.  HENT:  Head: Normocephalic and atraumatic.     Nose: Nose normal. No congestion or rhinorrhea.     Mouth/Throat:     Comments: No hemorrhaging from bilateral nares.  No trauma.  Dried blood is present in bilateral nares. Eyes:     Conjunctiva/sclera: Conjunctivae normal.  Cardiovascular:     Rate and Rhythm: Normal rate and regular rhythm.     Heart sounds: No murmur heard. Pulmonary:     Effort: Pulmonary effort is normal. No respiratory distress.     Breath sounds: Normal breath sounds.  Abdominal:     Palpations: Abdomen is soft.     Tenderness: There is no abdominal tenderness.  Musculoskeletal:        General: No swelling.      Cervical back: Neck supple.  Skin:    General: Skin is warm and dry.     Capillary Refill: Capillary refill takes less than 2 seconds.     Coloration: Skin is not jaundiced or pale.  Neurological:     Mental Status: He is alert and oriented to person, place, and time.  Psychiatric:        Mood and Affect: Mood normal.        Behavior: Behavior normal.     ED Results / Procedures / Treatments   Labs (all labs ordered are listed, but only abnormal results are displayed) Labs Reviewed  CBC - Abnormal; Notable for the following components:      Result Value   RBC 3.86 (*)    Hemoglobin 12.0 (*)    HCT 36.6 (*)    All other components within normal limits    EKG None  Radiology No results found.  Procedures Procedures   Medications Ordered in ED Medications  oxymetazoline (AFRIN) 0.05 % nasal spray 1 spray (has no administration in time range)  oxymetazoline (AFRIN) 0.05 % nasal spray 1 spray (has no administration in time range)    ED Course/ Medical Decision Making/ A&P   Medical Decision Making Amount and/or Complexity of Data Reviewed Labs: ordered.  Risk OTC drugs.   89 year old male presents for evaluation.  Please see HPI for further details.  On exam patient is afebrile and nontachycardic.  His lung sounds are clear bilaterally, he is not hypoxic.  Abdomen is soft and compressible.  Neurological examinations are baseline.  Patient does have dried blood in his bilateral naris.  No active hemorrhaging at this time.  Patient reports that his bleeding stopped prior to arrival to ED.  Patient hemoglobin stable.  Patient has no active bleeding at this time.  Patient nosebleed could be secondary to his Eliquis  use.  Will discharge and have him follow-up with PCP.  Will refer to ENT and have advised patient and patient wife to follow-up with ENT if they feel the need to do so.  Encouraged patient and patient wife to purchase Afrin and apply this to the patient nose if  it begins to bleed.  Have encouraged him to follow-up in the ED if they feel the need to do so.  Stable to discharge home.   Final Clinical Impression(s) / ED Diagnoses Final diagnoses:  Epistaxis    Rx / DC Orders ED Discharge Orders     None         Meghna Hagmann F, PA-C 08/24/23 1252    Karlyn Overman, MD 08/24/23 1321

## 2023-08-24 NOTE — Discharge Instructions (Addendum)
 It was a pleasure taking part in your care.  As discussed, your hemoglobin here is stable.  Your nose could have been bleeding earlier as a result of your use of blood thinner.  Regardless, it is reassuring that your hemoglobin is stable.  Your bleeding stopped when you arrived in the ED.  Please purchase Afrin and apply this to your nose next time you have a nosebleed.  Please continue taking all medications as prescribed.  Please follow-up with ENT, Dr. Virgia Griffins, if you feel the need to do so.  Return to the ED with any new or worsening symptoms.  Please read the attached guide concerning nosebleeds.

## 2023-08-24 NOTE — ED Triage Notes (Signed)
 Pt with mild bleeding from right nare for the last 12 hours. Pt on xarelto . NAD at present. VSS.

## 2023-09-30 ENCOUNTER — Inpatient Hospital Stay: Admitting: Neurology

## 2023-10-08 ENCOUNTER — Ambulatory Visit (INDEPENDENT_AMBULATORY_CARE_PROVIDER_SITE_OTHER): Admitting: Podiatry

## 2023-10-08 DIAGNOSIS — D2371 Other benign neoplasm of skin of right lower limb, including hip: Secondary | ICD-10-CM | POA: Diagnosis not present

## 2023-10-08 DIAGNOSIS — B351 Tinea unguium: Secondary | ICD-10-CM | POA: Diagnosis not present

## 2023-10-08 DIAGNOSIS — D689 Coagulation defect, unspecified: Secondary | ICD-10-CM | POA: Diagnosis not present

## 2023-10-08 DIAGNOSIS — M79676 Pain in unspecified toe(s): Secondary | ICD-10-CM

## 2023-10-09 NOTE — Progress Notes (Signed)
 He presents today with his cane and chief complaint of painful elongated toenails.  Objective: Vitals are stable alert oriented x 3.  Toenails are long thick yellow dystrophic with mycotic multiple benign skin lesions particular porokeratotic lesion plantar aspect of his right heel.  No open lesions or wounds.  Assessment: Pain limb secondary to onychomycosis and benign skin lesion.  Plan: Debridement of benign skin lesions and mycotic nails 1 through 5 bilateral.

## 2023-10-11 NOTE — Progress Notes (Signed)
 Subjective Patient ID: Amarion Portell is a 89 y.o. male.  Chief Complaint  Patient presents with  . Leg Swelling    Patient presents with c/o R lower leg swelling x approx 1 wk. Denies pain. Has previous hx of fx to right knee (March); continues to go to PT for that injury.    The following information was reviewed by members of the visit team:  Tobacco  Allergies  Meds  Problems  Med Hx  Surg Hx  Fam Hx  Soc  Hx     Pt is a 89 yo here for right lower leg swelling for one week; of note states he is currently in PT for patella fracture of right as well which occurred after a fall in March; no recent falls. Doing well with this states no pain and walking on cane. Swelling noted on right, mild,  No warmth/redness, no palpable cord. Stable and well appearing on exam    Review of Systems  Skin:        + swelling right leg   All other systems reviewed and are negative.   Objective Physical Exam Vitals and nursing note reviewed.  Constitutional:      General: He is not in acute distress.    Appearance: He is normal weight.  HENT:     Head: Normocephalic.     Nose: Nose normal.     Mouth/Throat:     Mouth: Mucous membranes are moist.   Eyes:     Extraocular Movements: Extraocular movements intact.     Pupils: Pupils are equal, round, and reactive to light.   Pulmonary:     Effort: Pulmonary effort is normal.   Musculoskeletal:        General: Swelling (right lower leg, 2+) present.     Cervical back: Normal range of motion.   Skin:    General: Skin is warm and dry.     Capillary Refill: Capillary refill takes less than 2 seconds.   Neurological:     General: No focal deficit present.     Mental Status: He is alert and oriented to person, place, and time.   Psychiatric:        Mood and Affect: Mood normal.        Behavior: Behavior normal.     Assessment/Plan Diagnoses and all orders for this visit:  Edema of right lower leg -     US  Peripheral Venous  Leg Unilat Right; Future  Other orders -     rivaroxaban  (Xarelto ) 15 mg tablet; Take 15 mg by mouth daily. -     ferrous sulfate 325 mg (65 mg iron) tablet; Take 1 tablet by mouth daily. -     atorvastatin  (LIPITOR) 10 mg tablet; Take 10 mg by mouth nightly. for cholesterol -     candesartan (ATACAND) 4 mg tablet; Take 2 mg by mouth daily. -     tamsulosin  (FLOMAX ) 0.4 mg cap; Take 0.4 mg by mouth at bedtime.    DDX: DVT, CHF, nephrotic syndrome, kidney disease, renal insufficiency, hepatic insufficiency,   MDM: Pt is a 89 yo here for right lower leg edema, he is on eqliuis however given age will check for DVT versus venous insufficiency, discussed with pt who is agreeable  US  study negative, no DVT likely secondary to inflammation versus some insufficiency at this point recc conservative tx with compression socks/elevation, discussed with pt who is agreeable. Advised on follow up and red flags warranting ER evaluation. Pt verbalized  understanding   Electronically signed: Rexene Charlies Pouch, NP 10/11/2023  4:15 PM

## 2023-10-11 NOTE — Telephone Encounter (Signed)
 Patient as called, name and DOB confirmed, at the request of the provider. Patient was informed of his ultrasound results and told to wear compression socks. Patient acknowledged understanding and hung up.

## 2023-10-14 ENCOUNTER — Other Ambulatory Visit: Payer: Self-pay

## 2023-10-14 DIAGNOSIS — G459 Transient cerebral ischemic attack, unspecified: Secondary | ICD-10-CM

## 2023-10-15 ENCOUNTER — Ambulatory Visit: Admitting: Podiatry

## 2023-10-22 ENCOUNTER — Telehealth: Payer: Self-pay | Admitting: Cardiovascular Disease

## 2023-10-22 DIAGNOSIS — I10 Essential (primary) hypertension: Secondary | ICD-10-CM

## 2023-10-22 DIAGNOSIS — E782 Mixed hyperlipidemia: Secondary | ICD-10-CM

## 2023-10-22 DIAGNOSIS — R600 Localized edema: Secondary | ICD-10-CM

## 2023-10-22 DIAGNOSIS — I4891 Unspecified atrial fibrillation: Secondary | ICD-10-CM

## 2023-10-22 NOTE — Telephone Encounter (Signed)
 Called and spoke to pt regarding the BLE Edema. Per chart review, pt was seen on 10/11/23 for this concern, DVT ruled out and recommendations were to wear Compression socks/elevate.   Pt states this started about 4 days ago, within the past week. OT ETTERDevere Moats from [facility] at [phone]) saw the pt today who told pt the Right leg was a 3 and Left leg was a 1. There was also mention of possibly having blood work drawn prior to seeing Dr. Court on 11/06/23. He denies warm/redness/tenderness to either leg. He has been wearing compression socks during the day and tries to elevate them when possible.   Recent BP's: Today 115/73 10/21/23 = 108/64 10/20/23 = 101/58   HR has been WNL.

## 2023-10-22 NOTE — Telephone Encounter (Signed)
 Spoke with pt to let him know the labs Dr. Court wanted to have the pt complete. Pt verbalized understanding of plan and said he would go tomorrow. Lab orders placed and released.

## 2023-10-22 NOTE — Telephone Encounter (Signed)
 Pt c/o swelling/edema: STAT if pt has developed SOB within 24 hours  If swelling, where is the swelling located? Right leg  How much weight have you gained and in what time span? No  Have you gained 2 pounds in a day or 5 pounds in a week? no  Do you have a log of your daily weights (if so, list)? no  Are you currently taking a fluid pill? no  Are you currently SOB? Only when he exerts himself  Have you traveled recently in a car or plane for an extended period of time? no

## 2023-10-24 ENCOUNTER — Ambulatory Visit: Payer: Self-pay | Admitting: Cardiovascular Disease

## 2023-10-24 LAB — HEPATIC FUNCTION PANEL
ALT: 15 IU/L (ref 0–44)
AST: 25 IU/L (ref 0–40)
Albumin: 4 g/dL (ref 3.6–4.6)
Alkaline Phosphatase: 146 IU/L — ABNORMAL HIGH (ref 44–121)
Bilirubin Total: 0.8 mg/dL (ref 0.0–1.2)
Bilirubin, Direct: 0.37 mg/dL (ref 0.00–0.40)
Total Protein: 6.3 g/dL (ref 6.0–8.5)

## 2023-10-24 LAB — LIPID PANEL
Chol/HDL Ratio: 2.6 ratio (ref 0.0–5.0)
Cholesterol, Total: 111 mg/dL (ref 100–199)
HDL: 42 mg/dL (ref >39)
LDL Chol Calc (NIH): 48 mg/dL (ref 0–99)
Triglycerides: 116 mg/dL (ref 0–149)
VLDL Cholesterol Cal: 21 mg/dL (ref 5–40)

## 2023-10-24 LAB — BASIC METABOLIC PANEL WITH GFR
BUN/Creatinine Ratio: 18 (ref 10–24)
BUN: 23 mg/dL (ref 10–36)
CO2: 23 mmol/L (ref 20–29)
Calcium: 10 mg/dL (ref 8.6–10.2)
Chloride: 104 mmol/L (ref 96–106)
Creatinine, Ser: 1.28 mg/dL — ABNORMAL HIGH (ref 0.76–1.27)
Glucose: 78 mg/dL (ref 70–99)
Potassium: 4.3 mmol/L (ref 3.5–5.2)
Sodium: 143 mmol/L (ref 134–144)
eGFR: 52 mL/min/{1.73_m2} — ABNORMAL LOW (ref >59)

## 2023-10-24 LAB — CBC
Hematocrit: 37.5 % (ref 37.5–51.0)
Hemoglobin: 12 g/dL — ABNORMAL LOW (ref 13.0–17.7)
MCH: 31.6 pg (ref 26.6–33.0)
MCHC: 32 g/dL (ref 31.5–35.7)
MCV: 99 fL — ABNORMAL HIGH (ref 79–97)
Platelets: 177 x10E3/uL (ref 150–450)
RBC: 3.8 x10E6/uL — ABNORMAL LOW (ref 4.14–5.80)
RDW: 13.1 % (ref 11.6–15.4)
WBC: 5.7 x10E3/uL (ref 3.4–10.8)

## 2023-10-24 NOTE — Telephone Encounter (Signed)
 Pts wife is upset about the results and would like the pt to be seen before 7/16. Please advise

## 2023-10-29 ENCOUNTER — Encounter (HOSPITAL_COMMUNITY)

## 2023-10-29 ENCOUNTER — Encounter: Admitting: Vascular Surgery

## 2023-10-29 ENCOUNTER — Telehealth: Payer: Self-pay | Admitting: Cardiovascular Disease

## 2023-10-29 NOTE — Telephone Encounter (Signed)
 Reason for walk-in: Walk-in Reasons: requesting to be seen today  If patient is requesting to be seen today, or if patient is having symptoms:  What symptoms are being reported (if any)?  Endemia (spelling) getting worse within past 2w; pt takes a diaretic (double dose since swelling).  Route to triage pool and ensure Teams message has been sent to the Triage Walk-In chat.  3.   For medication samples, medication refills, HIM requests, appointment requests, lab-related requests, or form/record drop-off, please route to the appropriate pool.

## 2023-10-29 NOTE — Telephone Encounter (Signed)
 Left message to call back

## 2023-11-06 ENCOUNTER — Encounter: Payer: Self-pay | Admitting: Cardiovascular Disease

## 2023-11-06 ENCOUNTER — Ambulatory Visit: Attending: Cardiovascular Disease | Admitting: Cardiovascular Disease

## 2023-11-06 VITALS — BP 130/72 | HR 58 | Ht 68.0 in | Wt 195.6 lb

## 2023-11-06 DIAGNOSIS — E782 Mixed hyperlipidemia: Secondary | ICD-10-CM | POA: Insufficient documentation

## 2023-11-06 DIAGNOSIS — R600 Localized edema: Secondary | ICD-10-CM | POA: Diagnosis present

## 2023-11-06 DIAGNOSIS — I350 Nonrheumatic aortic (valve) stenosis: Secondary | ICD-10-CM | POA: Insufficient documentation

## 2023-11-06 DIAGNOSIS — I10 Essential (primary) hypertension: Secondary | ICD-10-CM | POA: Diagnosis present

## 2023-11-06 DIAGNOSIS — I4811 Longstanding persistent atrial fibrillation: Secondary | ICD-10-CM | POA: Insufficient documentation

## 2023-11-06 MED ORDER — METOLAZONE 2.5 MG PO TABS: 2.5000 mg | ORAL_TABLET | Freq: Every day | ORAL | 3 refills | Status: DC

## 2023-11-06 MED ORDER — TORSEMIDE 20 MG PO TABS: 20.0000 mg | ORAL_TABLET | Freq: Every day | ORAL | 3 refills | Status: DC

## 2023-11-06 NOTE — Assessment & Plan Note (Signed)
 History of persistent A-fib on Eliquis  with pauses up to 4.6 seconds by monitor last year.

## 2023-11-06 NOTE — Assessment & Plan Note (Signed)
 History of hyperlipidemia on atorvastatin  with lipid profile performed 10/23/2023 revealing total cholesterol 111, LDL 48 and HDL 42.

## 2023-11-06 NOTE — Assessment & Plan Note (Signed)
 History of essential hypertension with blood pressure measured today at 130/72.  He is on Atacand.

## 2023-11-06 NOTE — Assessment & Plan Note (Signed)
 History of moderate aortic stenosis by 2D echo performed 04/05/2023 with normal LV systolic function.

## 2023-11-06 NOTE — Patient Instructions (Signed)
 Medication Instructions:  Your physician has recommended you make the following change in your medication:   -Take metolazone  (Zaroxolyn ) 2.5mg  once daily in the morning 30 minutes prior to torsemide .  -Start torsemide  (demadex ) 20mg  once daily.  *If you need a refill on your cardiac medications before your next appointment, please call your pharmacy*  Lab Work: Your physician recommends that you return for lab work in: 10-14 days for BMET  If you have labs (blood work) drawn today and your tests are completely normal, you will receive your results only by: MyChart Message (if you have MyChart) OR A paper copy in the mail If you have any lab test that is abnormal or we need to change your treatment, we will call you to review the results.   Follow-Up: At [facility], you and your health needs are our priority.  As part of our continuing mission to provide you with exceptional heart care, our providers are all part of one team.  This team includes your primary Cardiologist (physician) and Advanced Practice Providers or APPs (Physician Assistants and Nurse Practitioners) who all work together to provide you with the care you need, when you need it.  Your next appointment:   3 week(s)  Provider:   One of our Advanced Practice Providers (APPs): Morse Clause, PA-C  Lamarr Satterfield, NP Miriam Shams, NP  Olivia Pavy, PA-C Josefa Beauvais, NP  Leontine Salen, PA-C Orren Fabry, PA-C  Bufalo, PA-C Ernest Dick, NP  Damien Braver, NP Jon Hails, PA-C  Waddell Donath, PA-C    Dayna Dunn, PA-C  Scott Weaver, PA-C Lum Louis, NP Katlyn West, NP Callie Goodrich, PA-C  Evan Williams, PA-C Sheng Haley, PA-C  Xika Zhao, NP Kathleen Johnson, PA-C   Then, Dorn Lesches, MD will plan to see you again in 6 month(s).    We recommend signing up for the patient portal called MyChart.  Sign up information is provided on this After Visit Summary.  MyChart is used to connect with  patients for Virtual Visits (Telemedicine).  Patients are able to view lab/test results, encounter notes, upcoming appointments, etc.  Non-urgent messages can be sent to your provider as well.   To learn more about what you can do with MyChart, go to [email or web address].

## 2023-11-06 NOTE — Assessment & Plan Note (Signed)
 History of bilateral lower extremity edema right greater than left.  He takes metolazone  which has been uptitrated by his wife at home now up to 7.5 mg.  His weight has gone down 5 pounds in the last week.  He has an allergy to furosemide.  I am going to add low-dose torsemide  and decrease his metolazone  to 2.5 mg.  Will check a basic metabolic panel in 7 to 10 days and have him see an APP back in 3 weeks.  He denies chest pain or shortness of breath.

## 2023-11-06 NOTE — Progress Notes (Signed)
 11/06/2023 Zachary Underwood   06-24-1927  [id number]  Primary Physician Zachary Rojelio Caldron, NP Primary Cardiologist: Zachary JINNY Lesches MD GENI CODY [place], [place]  HPI:  Zachary Underwood is a 89 y.o.   delightful, moderately overweight married Caucasian male father of 4 children, grandfather of 5 grandchildren referred by Rojelio Delores, NP to be established in my practice because of A-fib and stroke.  I last saw him in the office 01/28/2023.  He is a PhD and an Chartered loss adjuster.  His wife, Zachary Underwood who accompanies him is also a PhD, lawyer and scientist.  They recently moved back from [place] in April 2023.  They were there for 12 years prior.  The patient's father, Zachary Underwood is a renowned Scientist, water quality.  I last saw him in the office 02/28/2022.  He has a history of treated hypertension and hyperlipidemia.  He is never had a heart attack.  He apparently had a stroke on 08/22/2021 and was hospitalized for 2 days.  It sounds like he had hemorrhagic stroke.  He does have chronic A-fib on Xarelto  oral anticoagulation.  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  He denies chest pain or shortness of breath.    Since I saw him in the office 10 months ago he has been relatively stable.  He did fall and fractured his right knee.  He walks with a cane.  He really has had no dizzy episodes.  He denies chest pain or shortness of breath.  Over the last couple weeks he is noticed increasing lower extremity edema right greater than left.  He was on metolazone  which his wife is uptitrated from 2.5-7.5 mg with resultant decrease in weight by about 5 pounds.   Current Meds  Medication Sig   atorvastatin  (LIPITOR) 10 MG tablet Take 5 mg by mouth at bedtime.   candesartan (ATACAND) 4 MG tablet Take 0.5 tablets by mouth at bedtime.   ferrous sulfate 325 (65 FE) MG EC tablet Take 325 mg by mouth daily.   metolazone  (ZAROXOLYN ) 2.5 MG tablet Take 1  tablet (2.5 mg total) by mouth daily. (Patient taking differently: Take 2.5 mg by mouth in the morning.)   Multiple Vitamins-Minerals (MULTIVITAMIN WITH MINERALS) tablet Take 0.5 tablets by mouth daily.   Rivaroxaban  (XARELTO ) 15 MG TABS tablet Take 15 mg by mouth daily with supper.   tamsulosin  (FLOMAX ) 0.4 MG CAPS capsule Take 0.4 mg by mouth at bedtime.   traMADol  (ULTRAM ) 50 MG tablet Take 50 mg by mouth daily as needed for moderate pain (pain score 4-6) or severe pain (pain score 7-10).     Allergies  Allergen Reactions   Short Ragweed Pollen Ext Other (See Comments)    Unknown    Beef-Derived Drug Products Other (See Comments)    Patient does not eat beef    Eliquis  [Apixaban ] Itching   Lasix [Furosemide] Itching   Other Other (See Comments) and Hypertension    Fusid- Legs became very swollen   Tylenol [Acetaminophen] Hives    Social History   Socioeconomic History   Marital status: Married    Spouse name: Zachary Underwood   Number of children: 4   Years of education: Not on file   Highest education level: Professional school degree (e.g., MD, DDS, DVM, JD)  Occupational History   Occupation: economist  Tobacco Use   Smoking status: Former    Types: Cigarettes   Smokeless  tobacco: Never   Tobacco comments:    Former smoker 03/29/23  Vaping Use   Vaping status: Never Used  Substance and Sexual Activity   Alcohol use: Never   Drug use: Never   Sexual activity: Not Currently  Other Topics Concern   Not on file  Social History Narrative   Lives with wife   Job Publishing copy    Social Drivers of Health   Financial Resource Strain: Low Risk  (09/04/2021)   Overall Financial Resource Strain (CARDIA)    Difficulty of Paying Living Expenses: Not hard at all  Food Insecurity: No Food Insecurity (07/24/2023)   Hunger Vital Sign    Worried About Running Out of Food in the Last Year: Never true    Ran Out of Food in the Last Year: Never true  Transportation Needs: Unmet  Transportation Needs (07/24/2023)   PRAPARE - Administrator, Civil Service (Medical): Yes    Lack of Transportation (Non-Medical): No  Physical Activity: Not on file  Stress: No Stress Concern Present (09/04/2021)   [employer] of Occupational Health - Occupational Stress Questionnaire    Feeling of Stress : Only a little  Social Connections: Unknown (07/24/2023)   Social Connection and Isolation Panel    Frequency of Communication with Friends and Family: Twice a week    Frequency of Social Gatherings with Friends and Family: Patient declined    Attends Religious Services: Patient declined    Database administrator or Organizations: Yes    Attends Banker Meetings: 1 to 4 times per year    Marital Status: Married  Catering manager Violence: Not At Risk (07/25/2023)   Humiliation, Afraid, Rape, and Kick questionnaire    Fear of Current or Ex-Partner: No    Emotionally Abused: No    Physically Abused: No    Sexually Abused: No     Review of Systems: General: negative for chills, fever, night sweats or weight changes.  Cardiovascular: negative for chest pain, dyspnea on exertion, edema, orthopnea, palpitations, paroxysmal nocturnal dyspnea or shortness of breath Dermatological: negative for rash Respiratory: negative for cough or wheezing Urologic: negative for hematuria Abdominal: negative for nausea, vomiting, diarrhea, bright red blood per rectum, melena, or hematemesis Neurologic: negative for visual changes, syncope, or dizziness All other systems reviewed and are otherwise negative except as noted above.    Blood pressure 130/72, pulse (!) 58, height 5' 8 (1.727 m), weight 195 lb 9.6 oz (88.7 kg), SpO2 99%.  General appearance: alert and no distress Neck: no adenopathy, no carotid bruit, no JVD, supple, symmetrical, trachea midline, and thyroid not enlarged, symmetric, no tenderness/mass/nodules Lungs: clear to auscultation bilaterally Heart:  irregularly irregular rhythm and soft outflow tract murmur Extremities: 2+ edema bilaterally Pulses: 2+ and symmetric Skin: Skin color, texture, turgor normal. No rashes or lesions Neurologic: Grossly normal  EKG not performed today      ASSESSMENT AND PLAN:   Atrial fibrillation (HCC) History of persistent A-fib on Eliquis  with pauses up to 4.6 seconds by monitor last year.  Hyperlipidemia History of hyperlipidemia on atorvastatin  with lipid profile performed 10/23/2023 revealing total cholesterol 111, LDL 48 and HDL 42.  Essential hypertension History of essential hypertension with blood pressure measured today at 130/72.  He is on Atacand.  Aortic stenosis, moderate History of moderate aortic stenosis by 2D echo performed 04/05/2023 with normal LV systolic function.  Bilateral lower extremity edema History of bilateral lower extremity edema right greater than left.  He  takes metolazone  which has been uptitrated by his wife at home now up to 7.5 mg.  His weight has gone down 5 pounds in the last week.  He has an allergy to furosemide.  I am going to add low-dose torsemide  and decrease his metolazone  to 2.5 mg.  Will check a basic metabolic panel in 7 to 10 days and have him see an APP back in 3 weeks.  He denies chest pain or shortness of breath.     Zachary DOROTHA Lesches MD FACP,FACC,FAHA, [facility] 11/06/2023 3:23 PM

## 2023-11-08 ENCOUNTER — Encounter: Payer: Self-pay | Admitting: Cardiovascular Disease

## 2023-11-22 ENCOUNTER — Inpatient Hospital Stay (HOSPITAL_COMMUNITY): Admitting: Certified Registered"

## 2023-11-22 ENCOUNTER — Emergency Department (HOSPITAL_COMMUNITY)

## 2023-11-22 ENCOUNTER — Inpatient Hospital Stay (HOSPITAL_COMMUNITY)
Admission: EM | Admit: 2023-11-22 | Discharge: 2023-11-28 | DRG: 552 | Disposition: A | Discharge status: Rehabilitation Facility | Attending: Internal Medicine | Admitting: Internal Medicine

## 2023-11-22 ENCOUNTER — Encounter (HOSPITAL_COMMUNITY): Payer: Self-pay

## 2023-11-22 ENCOUNTER — Other Ambulatory Visit: Payer: Self-pay

## 2023-11-22 ENCOUNTER — Encounter (HOSPITAL_COMMUNITY)
Admission: EM | Disposition: A | Discharge status: Rehabilitation Facility | Payer: Self-pay | Source: Home / Self Care | Attending: Internal Medicine

## 2023-11-22 DIAGNOSIS — Z823 Family history of stroke: Secondary | ICD-10-CM

## 2023-11-22 DIAGNOSIS — I482 Chronic atrial fibrillation, unspecified: Secondary | ICD-10-CM | POA: Diagnosis not present

## 2023-11-22 DIAGNOSIS — S12100A Unspecified displaced fracture of second cervical vertebra, initial encounter for closed fracture: Secondary | ICD-10-CM | POA: Diagnosis not present

## 2023-11-22 DIAGNOSIS — M4802 Spinal stenosis, cervical region: Secondary | ICD-10-CM | POA: Diagnosis present

## 2023-11-22 DIAGNOSIS — R600 Localized edema: Secondary | ICD-10-CM | POA: Diagnosis present

## 2023-11-22 DIAGNOSIS — S12190D Other displaced fracture of second cervical vertebra, subsequent encounter for fracture with routine healing: Secondary | ICD-10-CM | POA: Diagnosis not present

## 2023-11-22 DIAGNOSIS — D649 Anemia, unspecified: Secondary | ICD-10-CM | POA: Diagnosis present

## 2023-11-22 DIAGNOSIS — W19XXXA Unspecified fall, initial encounter: Secondary | ICD-10-CM

## 2023-11-22 DIAGNOSIS — N189 Chronic kidney disease, unspecified: Secondary | ICD-10-CM | POA: Diagnosis not present

## 2023-11-22 DIAGNOSIS — N179 Acute kidney failure, unspecified: Secondary | ICD-10-CM | POA: Diagnosis present

## 2023-11-22 DIAGNOSIS — Z23 Encounter for immunization: Secondary | ICD-10-CM | POA: Diagnosis not present

## 2023-11-22 DIAGNOSIS — R413 Other amnesia: Secondary | ICD-10-CM | POA: Diagnosis present

## 2023-11-22 DIAGNOSIS — I7121 Aneurysm of the ascending aorta, without rupture: Secondary | ICD-10-CM

## 2023-11-22 DIAGNOSIS — I110 Hypertensive heart disease with heart failure: Secondary | ICD-10-CM | POA: Diagnosis present

## 2023-11-22 DIAGNOSIS — I4821 Permanent atrial fibrillation: Secondary | ICD-10-CM | POA: Diagnosis present

## 2023-11-22 DIAGNOSIS — Z7901 Long term (current) use of anticoagulants: Secondary | ICD-10-CM

## 2023-11-22 DIAGNOSIS — I10 Essential (primary) hypertension: Secondary | ICD-10-CM | POA: Diagnosis not present

## 2023-11-22 DIAGNOSIS — S12100S Unspecified displaced fracture of second cervical vertebra, sequela: Secondary | ICD-10-CM | POA: Diagnosis not present

## 2023-11-22 DIAGNOSIS — R519 Headache, unspecified: Secondary | ICD-10-CM | POA: Diagnosis not present

## 2023-11-22 DIAGNOSIS — M2578 Osteophyte, vertebrae: Secondary | ICD-10-CM | POA: Diagnosis present

## 2023-11-22 DIAGNOSIS — I130 Hypertensive heart and chronic kidney disease with heart failure and stage 1 through stage 4 chronic kidney disease, or unspecified chronic kidney disease: Secondary | ICD-10-CM | POA: Diagnosis not present

## 2023-11-22 DIAGNOSIS — S0101XD Laceration without foreign body of scalp, subsequent encounter: Secondary | ICD-10-CM | POA: Diagnosis not present

## 2023-11-22 DIAGNOSIS — R54 Age-related physical debility: Secondary | ICD-10-CM | POA: Diagnosis present

## 2023-11-22 DIAGNOSIS — Z8673 Personal history of transient ischemic attack (TIA), and cerebral infarction without residual deficits: Secondary | ICD-10-CM

## 2023-11-22 DIAGNOSIS — W010XXA Fall on same level from slipping, tripping and stumbling without subsequent striking against object, initial encounter: Secondary | ICD-10-CM | POA: Diagnosis present

## 2023-11-22 DIAGNOSIS — E785 Hyperlipidemia, unspecified: Secondary | ICD-10-CM | POA: Diagnosis present

## 2023-11-22 DIAGNOSIS — R1312 Dysphagia, oropharyngeal phase: Secondary | ICD-10-CM | POA: Diagnosis present

## 2023-11-22 DIAGNOSIS — Z6830 Body mass index (BMI) 30.0-30.9, adult: Secondary | ICD-10-CM

## 2023-11-22 DIAGNOSIS — Z888 Allergy status to other drugs, medicaments and biological substances status: Secondary | ICD-10-CM

## 2023-11-22 DIAGNOSIS — F419 Anxiety disorder, unspecified: Secondary | ICD-10-CM | POA: Diagnosis not present

## 2023-11-22 DIAGNOSIS — Z91014 Allergy to mammalian meats: Secondary | ICD-10-CM

## 2023-11-22 DIAGNOSIS — I350 Nonrheumatic aortic (valve) stenosis: Secondary | ICD-10-CM

## 2023-11-22 DIAGNOSIS — E7800 Pure hypercholesterolemia, unspecified: Secondary | ICD-10-CM | POA: Diagnosis not present

## 2023-11-22 DIAGNOSIS — R5381 Other malaise: Secondary | ICD-10-CM | POA: Diagnosis present

## 2023-11-22 DIAGNOSIS — I4819 Other persistent atrial fibrillation: Secondary | ICD-10-CM | POA: Diagnosis not present

## 2023-11-22 DIAGNOSIS — D6869 Other thrombophilia: Secondary | ICD-10-CM | POA: Diagnosis not present

## 2023-11-22 DIAGNOSIS — M4801 Spinal stenosis, occipito-atlanto-axial region: Secondary | ICD-10-CM | POA: Diagnosis present

## 2023-11-22 DIAGNOSIS — M25562 Pain in left knee: Secondary | ICD-10-CM | POA: Diagnosis present

## 2023-11-22 DIAGNOSIS — I700 Atherosclerosis of aorta: Secondary | ICD-10-CM | POA: Diagnosis present

## 2023-11-22 DIAGNOSIS — Z79899 Other long term (current) drug therapy: Secondary | ICD-10-CM | POA: Diagnosis not present

## 2023-11-22 DIAGNOSIS — E870 Hyperosmolality and hypernatremia: Secondary | ICD-10-CM | POA: Diagnosis not present

## 2023-11-22 DIAGNOSIS — I2510 Atherosclerotic heart disease of native coronary artery without angina pectoris: Secondary | ICD-10-CM | POA: Diagnosis present

## 2023-11-22 DIAGNOSIS — Z87891 Personal history of nicotine dependence: Secondary | ICD-10-CM

## 2023-11-22 DIAGNOSIS — I5032 Chronic diastolic (congestive) heart failure: Secondary | ICD-10-CM | POA: Diagnosis present

## 2023-11-22 DIAGNOSIS — S12000A Unspecified displaced fracture of first cervical vertebra, initial encounter for closed fracture: Secondary | ICD-10-CM | POA: Diagnosis present

## 2023-11-22 DIAGNOSIS — M8588 Other specified disorders of bone density and structure, other site: Secondary | ICD-10-CM | POA: Diagnosis present

## 2023-11-22 DIAGNOSIS — Z886 Allergy status to analgesic agent status: Secondary | ICD-10-CM | POA: Diagnosis not present

## 2023-11-22 DIAGNOSIS — I4892 Unspecified atrial flutter: Secondary | ICD-10-CM | POA: Diagnosis not present

## 2023-11-22 DIAGNOSIS — E669 Obesity, unspecified: Secondary | ICD-10-CM | POA: Diagnosis present

## 2023-11-22 DIAGNOSIS — S12111A Posterior displaced Type II dens fracture, initial encounter for closed fracture: Secondary | ICD-10-CM | POA: Diagnosis not present

## 2023-11-22 DIAGNOSIS — N400 Enlarged prostate without lower urinary tract symptoms: Secondary | ICD-10-CM | POA: Diagnosis present

## 2023-11-22 DIAGNOSIS — R001 Bradycardia, unspecified: Secondary | ICD-10-CM | POA: Diagnosis present

## 2023-11-22 DIAGNOSIS — M4312 Spondylolisthesis, cervical region: Secondary | ICD-10-CM | POA: Diagnosis present

## 2023-11-22 DIAGNOSIS — S12110S Anterior displaced Type II dens fracture, sequela: Secondary | ICD-10-CM | POA: Diagnosis not present

## 2023-11-22 DIAGNOSIS — T7840XD Allergy, unspecified, subsequent encounter: Secondary | ICD-10-CM | POA: Diagnosis not present

## 2023-11-22 DIAGNOSIS — R0902 Hypoxemia: Secondary | ICD-10-CM | POA: Diagnosis present

## 2023-11-22 DIAGNOSIS — Z8679 Personal history of other diseases of the circulatory system: Secondary | ICD-10-CM

## 2023-11-22 DIAGNOSIS — D631 Anemia in chronic kidney disease: Secondary | ICD-10-CM | POA: Diagnosis not present

## 2023-11-22 DIAGNOSIS — S0101XA Laceration without foreign body of scalp, initial encounter: Secondary | ICD-10-CM | POA: Diagnosis not present

## 2023-11-22 DIAGNOSIS — J811 Chronic pulmonary edema: Secondary | ICD-10-CM | POA: Diagnosis not present

## 2023-11-22 DIAGNOSIS — M546 Pain in thoracic spine: Secondary | ICD-10-CM | POA: Diagnosis not present

## 2023-11-22 DIAGNOSIS — R296 Repeated falls: Secondary | ICD-10-CM | POA: Diagnosis present

## 2023-11-22 DIAGNOSIS — W010XXD Fall on same level from slipping, tripping and stumbling without subsequent striking against object, subsequent encounter: Secondary | ICD-10-CM | POA: Diagnosis present

## 2023-11-22 DIAGNOSIS — M25462 Effusion, left knee: Secondary | ICD-10-CM | POA: Diagnosis present

## 2023-11-22 DIAGNOSIS — S12111D Posterior displaced Type II dens fracture, subsequent encounter for fracture with routine healing: Secondary | ICD-10-CM | POA: Diagnosis not present

## 2023-11-22 DIAGNOSIS — R1310 Dysphagia, unspecified: Secondary | ICD-10-CM | POA: Diagnosis not present

## 2023-11-22 LAB — SURGICAL PCR SCREEN
MRSA, PCR: NEGATIVE
Staphylococcus aureus: NEGATIVE

## 2023-11-22 LAB — CBC
HCT: 33.4 % — ABNORMAL LOW (ref 39.0–52.0)
Hemoglobin: 10.6 g/dL — ABNORMAL LOW (ref 13.0–17.0)
MCH: 30.5 pg (ref 26.0–34.0)
MCHC: 31.7 g/dL (ref 30.0–36.0)
MCV: 96.3 fL (ref 80.0–100.0)
Platelets: 170 K/uL (ref 150–400)
RBC: 3.47 MIL/uL — ABNORMAL LOW (ref 4.22–5.81)
RDW: 13.9 % (ref 11.5–15.5)
WBC: 7 K/uL (ref 4.0–10.5)
nRBC: 0 % (ref 0.0–0.2)

## 2023-11-22 LAB — APTT: aPTT: 67 s — ABNORMAL HIGH (ref 24–36)

## 2023-11-22 LAB — TYPE AND SCREEN
ABO/RH(D): A POS
Antibody Screen: NEGATIVE

## 2023-11-22 LAB — COMPREHENSIVE METABOLIC PANEL WITH GFR
ALT: 19 U/L (ref 0–44)
AST: 31 U/L (ref 15–41)
Albumin: 3.3 g/dL — ABNORMAL LOW (ref 3.5–5.0)
Alkaline Phosphatase: 107 U/L (ref 38–126)
Anion gap: 7 (ref 5–15)
BUN: 27 mg/dL — ABNORMAL HIGH (ref 8–23)
CO2: 24 mmol/L (ref 22–32)
Calcium: 9.4 mg/dL (ref 8.9–10.3)
Chloride: 107 mmol/L (ref 98–111)
Creatinine, Ser: 1.38 mg/dL — ABNORMAL HIGH (ref 0.61–1.24)
GFR, Estimated: 47 mL/min — ABNORMAL LOW (ref >60)
Glucose, Bld: 137 mg/dL — ABNORMAL HIGH (ref 70–99)
Potassium: 3.9 mmol/L (ref 3.5–5.1)
Sodium: 138 mmol/L (ref 135–145)
Total Bilirubin: 1.1 mg/dL (ref 0.0–1.2)
Total Protein: 6.1 g/dL — ABNORMAL LOW (ref 6.5–8.1)

## 2023-11-22 LAB — PROTIME-INR
INR: 2 — ABNORMAL HIGH (ref 0.8–1.2)
Prothrombin Time: 23.6 s — ABNORMAL HIGH (ref 11.4–15.2)

## 2023-11-22 LAB — I-STAT CHEM 8, ED
BUN: 27 mg/dL — ABNORMAL HIGH (ref 8–23)
Calcium, Ion: 1.15 mmol/L (ref 1.15–1.40)
Chloride: 105 mmol/L (ref 98–111)
Creatinine, Ser: 1.4 mg/dL — ABNORMAL HIGH (ref 0.61–1.24)
Glucose, Bld: 137 mg/dL — ABNORMAL HIGH (ref 70–99)
HCT: 30 % — ABNORMAL LOW (ref 39.0–52.0)
Hemoglobin: 10.2 g/dL — ABNORMAL LOW (ref 13.0–17.0)
Potassium: 3.9 mmol/L (ref 3.5–5.1)
Sodium: 139 mmol/L (ref 135–145)
TCO2: 24 mmol/L (ref 22–32)

## 2023-11-22 LAB — ETHANOL: Alcohol, Ethyl (B): <15 mg/dL (ref <15)

## 2023-11-22 LAB — ABO/RH: ABO/RH(D): A POS

## 2023-11-22 LAB — I-STAT CG4 LACTIC ACID, ED: Lactic Acid, Venous: 1.4 mmol/L (ref 0.5–1.9)

## 2023-11-22 SURGERY — POSTERIOR CERVICAL FUSION/FORAMINOTOMY LEVEL 1
Anesthesia: General

## 2023-11-22 MED ORDER — TETANUS-DIPHTH-ACELL PERTUSSIS 5-2.5-18.5 LF-MCG/0.5 IM SUSY
0.5000 mL | PREFILLED_SYRINGE | Freq: Once | INTRAMUSCULAR | Status: AC
  Administered 2023-11-22: 0.5 mL via INTRAMUSCULAR
  Filled 2023-11-22: qty 0.5

## 2023-11-22 MED ORDER — ONDANSETRON HCL 4 MG PO TABS: 4.0000 mg | ORAL_TABLET | Freq: Four times a day (QID) | ORAL | Status: DC | PRN

## 2023-11-22 MED ORDER — MORPHINE SULFATE (PF) 2 MG/ML IV SOLN
2.0000 mg | Freq: Once | INTRAVENOUS | Status: AC
  Administered 2023-11-22: 2 mg via INTRAVENOUS
  Filled 2023-11-22: qty 1

## 2023-11-22 MED ORDER — HYDROMORPHONE HCL 1 MG/ML IJ SOLN: 0.5000 mg | Freq: Once | INTRAMUSCULAR | Status: AC

## 2023-11-22 MED ORDER — EMPTY CONTAINERS FLEXIBLE MISC: 900.0000 mg | Freq: Once | Status: DC

## 2023-11-22 MED ORDER — PROPOFOL 10 MG/ML IV BOLUS
INTRAVENOUS | Status: AC
  Filled 2023-11-22: qty 20

## 2023-11-22 MED ORDER — IOHEXOL 350 MG/ML SOLN
75.0000 mL | Freq: Once | INTRAVENOUS | Status: AC | PRN
  Administered 2023-11-22: 75 mL via INTRAVENOUS

## 2023-11-22 MED ORDER — PROTHROMBIN COMPLEX CONC HUMAN 500 UNITS IV KIT
4039.0000 [IU] | PACK | Status: AC
  Administered 2023-11-22: 4039 [IU] via INTRAVENOUS
  Filled 2023-11-22: qty 4039

## 2023-11-22 MED ORDER — CHLORHEXIDINE GLUCONATE 0.12 % MT SOLN: 15.0000 mL | Freq: Once | OROMUCOSAL | Status: DC

## 2023-11-22 MED ORDER — HYDROMORPHONE HCL 1 MG/ML IJ SOLN
0.5000 mg | INTRAMUSCULAR | Status: DC | PRN
  Administered 2023-11-23: 0.5 mg via INTRAVENOUS
  Filled 2023-11-22: qty 0.5

## 2023-11-22 MED ORDER — SODIUM CHLORIDE 0.9% FLUSH
3.0000 mL | Freq: Two times a day (BID) | INTRAVENOUS | Status: DC
  Administered 2023-11-22 – 2023-11-28 (×10): 3 mL via INTRAVENOUS

## 2023-11-22 MED ORDER — HYDROMORPHONE HCL 1 MG/ML IJ SOLN
INTRAMUSCULAR | Status: AC
  Administered 2023-11-22: 0.5 mg via INTRAVENOUS
  Filled 2023-11-22: qty 1

## 2023-11-22 MED ORDER — MORPHINE SULFATE (PF) 2 MG/ML IV SOLN
1.0000 mg | Freq: Once | INTRAVENOUS | Status: AC
  Administered 2023-11-22: 1 mg via INTRAVENOUS
  Filled 2023-11-22: qty 1

## 2023-11-22 MED ORDER — SENNOSIDES-DOCUSATE SODIUM 8.6-50 MG PO TABS: 1.0000 | ORAL_TABLET | Freq: Every evening | ORAL | Status: DC | PRN

## 2023-11-22 MED ORDER — FENTANYL CITRATE (PF) 250 MCG/5ML IJ SOLN
INTRAMUSCULAR | Status: AC
  Filled 2023-11-22: qty 5

## 2023-11-22 MED ORDER — TAMSULOSIN HCL 0.4 MG PO CAPS
0.4000 mg | ORAL_CAPSULE | Freq: Every day | ORAL | Status: DC
Start: 2023-11-23 — End: 2023-11-28
  Administered 2023-11-23 – 2023-11-27 (×5): 0.4 mg via ORAL
  Filled 2023-11-22 (×5): qty 1

## 2023-11-22 MED ORDER — LACTATED RINGERS IV SOLN: INTRAVENOUS | Status: AC

## 2023-11-22 MED ORDER — ONDANSETRON HCL 4 MG/2ML IJ SOLN: 4.0000 mg | Freq: Four times a day (QID) | INTRAMUSCULAR | Status: DC | PRN

## 2023-11-22 MED ORDER — LACTATED RINGERS IV SOLN: INTRAVENOUS | Status: DC

## 2023-11-22 MED ORDER — CHLORHEXIDINE GLUCONATE 0.12 % MT SOLN
OROMUCOSAL | Status: AC
  Filled 2023-11-22: qty 15

## 2023-11-22 MED ORDER — ATORVASTATIN CALCIUM 10 MG PO TABS
5.0000 mg | ORAL_TABLET | Freq: Every day | ORAL | Status: DC
  Administered 2023-11-23 – 2023-11-27 (×5): 5 mg via ORAL
  Filled 2023-11-22 (×5): qty 1

## 2023-11-22 MED ORDER — ORAL CARE MOUTH RINSE: 15.0000 mL | Freq: Once | OROMUCOSAL | Status: DC

## 2023-11-22 SURGICAL SUPPLY — 45 items
BAG COUNTER SPONGE SURGICOUNT (BAG) IMPLANT
BAND RUBBER #18 3X1/16 STRL (MISCELLANEOUS) IMPLANT
BENZOIN TINCTURE PRP APPL 2/3 (GAUZE/BANDAGES/DRESSINGS) IMPLANT
BLADE CLIPPER SURG (BLADE) IMPLANT
BLADE ULTRA TIP 2M (BLADE) IMPLANT
BUR MATCHSTICK NEURO 3.0 LAGG (BURR) IMPLANT
BUR PRECISION FLUTE 5.0 (BURR) IMPLANT
CANISTER SUCTION 3000ML PPV (SUCTIONS) IMPLANT
COVERAGE SUPPORT O-ARM STEALTH (MISCELLANEOUS) ×1 IMPLANT
DRAPE BACK TABLE (DRAPES) IMPLANT
DRAPE C-ARM 42X72 X-RAY (DRAPES) IMPLANT
DRAPE LAPAROTOMY 100X72 PEDS (DRAPES) IMPLANT
DRAPE SHEET LG 3/4 BI-LAMINATE (DRAPES) IMPLANT
DURAPREP 6ML APPLICATOR 50/CS (WOUND CARE) IMPLANT
ELECTRODE REM PT RTRN 9FT ADLT (ELECTROSURGICAL) IMPLANT
FEE COVERAGE SUPPORT O-ARM (MISCELLANEOUS) ×1 IMPLANT
GAUZE 4X4 16PLY ~~LOC~~+RFID DBL (SPONGE) IMPLANT
GAUZE SPONGE 4X4 12PLY STRL (GAUZE/BANDAGES/DRESSINGS) IMPLANT
GLOVE BIOGEL PI IND STRL 7.5 (GLOVE) IMPLANT
GLOVE ECLIPSE 7.0 STRL STRAW (GLOVE) IMPLANT
GLOVE EXAM NITRILE XL STR (GLOVE) IMPLANT
GOWN STRL REUS W/ TWL LRG LVL3 (GOWN DISPOSABLE) IMPLANT
GOWN STRL REUS W/ TWL XL LVL3 (GOWN DISPOSABLE) IMPLANT
GOWN STRL REUS W/TWL 2XL LVL3 (GOWN DISPOSABLE) IMPLANT
HEMOSTAT POWDER KIT SURGIFOAM (HEMOSTASIS) IMPLANT
KIT BASIN OR (CUSTOM PROCEDURE TRAY) IMPLANT
KIT TURNOVER KIT B (KITS) IMPLANT
MARKER SPHERE PSV REFLC NDI (MISCELLANEOUS) IMPLANT
NEEDLE HYPO 22X1.5 SAFETY MO (MISCELLANEOUS) IMPLANT
NS IRRIG 1000ML POUR BTL (IV SOLUTION) IMPLANT
PACK LAMINECTOMY NEURO (CUSTOM PROCEDURE TRAY) IMPLANT
PAD ARMBOARD POSITIONER FOAM (MISCELLANEOUS) IMPLANT
PIN MAYFIELD SKULL DISP (PIN) IMPLANT
SPIKE FLUID TRANSFER (MISCELLANEOUS) IMPLANT
SPONGE T-LAP 4X18 ~~LOC~~+RFID (SPONGE) IMPLANT
STAPLER SKIN PROX 35W (STAPLE) IMPLANT
STRIP CLOSURE SKIN 1/2X4 (GAUZE/BANDAGES/DRESSINGS) IMPLANT
SUT 3-0 BLK 1X30 PSL (SUTURE) IMPLANT
SUT VIC AB 0 CT1 18XCR BRD8 (SUTURE) IMPLANT
SUT VIC AB 2-0 CT1 18 (SUTURE) IMPLANT
SUT VICRYL 3-0 RB1 18 ABS (SUTURE) IMPLANT
TOWEL GREEN STERILE (TOWEL DISPOSABLE) IMPLANT
TOWEL GREEN STERILE FF (TOWEL DISPOSABLE) IMPLANT
UNDERPAD 30X36 HEAVY ABSORB (UNDERPADS AND DIAPERS) ×1 IMPLANT
WATER STERILE IRR 1000ML POUR (IV SOLUTION) IMPLANT

## 2023-11-22 NOTE — ED Provider Notes (Signed)
 Assumed care of patient from off-going team. For more details, please see note from same day.  In brief, this is a 89 y.o. male with fall and found to have type II odontoid fracture with hematoma. Reversing eliquis . NSGY recommended MRI and medicine admission. Currently neurologically intact.    Plan/Dispo at time of sign-out & ED Course since sign-out: [ ]  hospitalist stepdown admission  BP (!) 179/67   Pulse (!) 55   Temp 97.8 F (36.6 C) (Axillary)   Resp 20   Ht 5' 8 (1.727 m)   Wt 84.4 kg   SpO2 95%   BMI 28.28 kg/m    ED Course:   Clinical Course as of 11/22/23 1724  Fri Nov 22, 2023  1407 Posterior cervical fusion.   Hosp admit. Progressive floor.   Can go in [place] J collar  [TL]  1721 Patient was taken to MRI and then moved to short stay per neurology recommendations. Patient was paged out to assigned hospitalist apparently incorrectly after discussing with hospitalist. Repaging out to unassigned hospitalist.  [HN]  1722 Received call from radiology about MRI. Re-demonstrated known displaced dens fracture. No epidural hematoma noted. No severe cord narrowing or signal abnormality there. Few areas of ligamentous injury including the ALL and PLL at C2. Posterior atlantoaxial membrane is disrupted and ligamentum flavum at C2-3 and C3-4 w/ edema, may be injured as well.  [HN]    Clinical Course User Index [HN] Franklyn Sid SAILOR, MD [TL] Simon Lavonia SAILOR, MD    Dispo: Patient admitted to Dr. Claudene with [facility], going to OR w/ NSGY ------------------------------- Sid Franklyn, MD Emergency Medicine  This note was created using dictation software, which may contain spelling or grammatical errors.   Franklyn Sid SAILOR, MD 11/22/23 [phone]

## 2023-11-22 NOTE — ED Notes (Signed)
 Patient transported to CT

## 2023-11-22 NOTE — ED Notes (Signed)
 CCMD contacted to place the patient on cardiac monitoring services.

## 2023-11-22 NOTE — H&P (Signed)
 History and Physical    Zachary Underwood FMW:[id number] DOB: 1927/06/28 DOA: 11/22/2023  PCP: [name]  Patient coming from: Home  I have personally briefly reviewed patient's old medical records in [facility]  Chief Complaint: Head and neck pain after fall  HPI: Zachary Underwood is a 89 y.o. male with medical history significant for persistent atrial fibrillation on Xarelto , history of CVA, moderate aortic stenosis, s/p surgical repair of ascending thoracic aorta, HTN, HLD, BPH, chronic lower extremity edema who presented to the ED for evaluation after he fell and hit his head and neck.  Patient was trying to get into his car earlier today when he slipped and fell.  He hit his head and neck.  He did not lose consciousness.  He was having pain at the back of his neck.  EMS were called and he was brought to the ED for further evaluation.  Patient is functional at baseline and continues to teach as a professor.  ED Course  Labs/Imaging on admission: I have personally reviewed following labs and imaging studies.  Initial vitals showed BP 137/58, pulse 107, RR 16, temp 97.3 F, SpO2 97% on room air.  Labs showed WBC 7.0, hemoglobin 10.6, platelets 170, sodium 138, potassium 3.9, bicarb 24, BUN 27, creatinine 1.38, serum glucose 137, LFTs within normal limits, lactic acid 1.4.  Extensive trauma imaging: CT head:  1.  No evidence of an acute intracranial abnormality. 2. Frontoparietal scalp laceration and hematoma. 3. Generalized cerebral atrophy. 4. 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   CT maxillofacial:  1. No evidence of an acute maxillofacial fracture. 2. Acute cervical spine fractures separately reported on same day cervical spine CT.  CT chest/abdomen/pelvis with contrast 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 Recommend semi-annual imaging followup by CTA or MRA and  referral to cardiothoracic surgery if not already obtained. This recommendation follows 2010 ACCF/AHA/AATS/ACR/ASA/SCA/SCAI/SIR/STS/SVM Guidelines for the Diagnosis and Management of Patients With Thoracic Aortic Disease. Circulation. 2010; 121: Z733-z630. Aortic aneurysm NOS (ICD10-I71.9).   8.5 x 4.7 cm bilobed fatty lesion seen in left pectoral region which was present on prior exam of 2024, but now noted 13 mm nodule associated with this lesion. This may simply represent lipoma, but liposarcoma cannot be excluded. MRI may be performed for further evaluation.  Status post prostatic brachytherapy seed placement.   Probable old L2 compression fracture.   Coronary artery calcifications are noted suggesting coronary artery disease.   Aortic Atherosclerosis (ICD10-I70.0).  CT cervical spine without contrast IMPRESSION: 1. Displaced type II odontoid fracture with approximately 12 mm posterior displacement relative to the body of C2. Associated prevertebral soft tissue swelling and possible epidural hematoma. There is at least moderate spinal canal stenosis at C1-2. Recommend MRI cervical spine for further evaluation. 2. Additional mildly displaced fractures through the posterior arch of C1 and an additional nondisplaced fracture through the midline anterior arch of C1. 3. Similar anterolisthesis of C5 on C6 likely related to degenerative changes. 4. Findings discussed with Dr. Simon at 1:50PM on 11/22/23.  MRI cervical spine without contrast IMPRESSION: 1. Displaced type 2 odontoid fracture with significant posterior dislocation of the dens relative to the body of C2. Associated edema and possible blood products interposed between dens and C2. No definite epidural hematoma. 2. Displacement of dens as well as malalignment of the bilateral atlantoaxial articulations results in moderate spinal canal stenosis and ventral cervical cord flattening. 3. Disruption  of the anterior longitudinal ligament and  posterior longitudinal ligament at C2. Edema along the posterior atlantoaxial membrane suggestive of ligamentous injury. Possible disruption of the ligamentum flavum at C2-3 and C3-4. 4. C1 fractures better seen on CT. 5. Significant prevertebral edema. 6. Disc osteophyte complexes at C5-6 contributing to mild spinal canal stenosis.  Patient was given Kcentra and Tdap.  Neurosurgery or consulted and initially patient was to be taken for surgery which was ultimately canceled.  The hospitalist service was consulted to admit.  Review of Systems: All systems reviewed and are negative except as documented in history of present illness above.   Past Medical History:  Diagnosis Date   Arthritis    Cardiac arrhythmia    Hypertension    Ruptured aortic aneurysm (HCC)    Stroke (HCC) 08/23/2021    Past Surgical History:  Procedure Laterality Date   open heart surgery     ruptured aortic aneurysm repair      Social History: Social History   Tobacco Use   Smoking status: Former    Types: Cigarettes   Smokeless tobacco: Never   Tobacco comments:    Former smoker 03/29/23  Vaping Use   Vaping status: Never Used  Substance Use Topics   Alcohol use: Never   Drug use: Never   Allergies  Allergen Reactions   Short Ragweed Pollen Ext Other (See Comments)    Unknown    Beef-Derived Drug Products Other (See Comments)    Patient does not eat beef    Eliquis  [Apixaban ] Itching   Lasix [Furosemide] Itching   Other Other (See Comments) and Hypertension    Fusid- Legs became very swollen   Tylenol [Acetaminophen] Hives    History reviewed. No pertinent family history.   Prior to Admission medications   Medication Sig Start Date End Date Taking? Authorizing Provider  tamsulosin  (FLOMAX ) 0.4 MG CAPS capsule Take 0.4 mg by mouth at bedtime.   Yes [provider]  atorvastatin  (LIPITOR) 10 MG tablet Take 5 mg by mouth at bedtime. 06/07/23   [provider]  candesartan  (ATACAND) 4 MG tablet Take 0.5 tablets by mouth at bedtime. 06/07/23   [provider]  ferrous sulfate 325 (65 FE) MG EC tablet Take 325 mg by mouth daily.    [provider]  metolazone  (ZAROXOLYN ) 2.5 MG tablet Take 1 tablet (2.5 mg total) by mouth daily. 11/06/23   Court Dorn PARAS, MD  Multiple Vitamins-Minerals (MULTIVITAMIN WITH MINERALS) tablet Take 0.5 tablets by mouth daily.    [provider]  Rivaroxaban  (XARELTO ) 15 MG TABS tablet Take 15 mg by mouth daily with supper.    [provider]  torsemide  (DEMADEX ) 20 MG tablet Take 1 tablet (20 mg total) by mouth daily. 11/06/23   Court Dorn PARAS, MD  traMADol  (ULTRAM ) 50 MG tablet Take 50 mg by mouth daily as needed for moderate pain (pain score 4-6) or severe pain (pain score 7-10). 07/04/23   [provider]    Physical Exam: Vitals:   11/22/23 1830 11/22/23 1900 11/22/23 1905 11/22/23 2000  BP: 129/64  (!) 140/69 (!) 160/72  Pulse: (!) 50 (!) 58 (!) 51 (!) 51  Resp: 19 12 16  (!) 21  Temp:      TempSrc:   Axillary   SpO2: 96% 90% 96% 98%  Weight:      Height:       Constitutional: Elderly male resting supine in bed Eyes: EOMI, lids and conjunctivae normal ENMT:  Mucous membranes are dry. Posterior pharynx clear of any exudate or lesions.Normal dentition.  Neck: [place] J collar in place Respiratory: clear to auscultation anteriorly. Normal respiratory effort. No accessory muscle use.  Cardiovascular: Bradycardic, no murmurs / rubs / gallops. No extremity edema.  Abdomen: Soft, no tenderness, no masses palpated. Musculoskeletal: no clubbing / cyanosis.  Skin: no rashes, lesions, ulcers. No induration Neurologic: Sensation intact. Strength 5/5 all extremities Psychiatric: Alert and oriented x 3. Normal mood.   EKG: Personally reviewed. Atrial fibrillation, rate 56, RBBB.  Similar to previous.  Assessment/Plan Principal Problem:   Closed posterior displaced Type II dens fracture  (HCC) Active Problems:   Persistent atrial fibrillation (HCC)   Hyperlipidemia   Essential hypertension   History of CVA (cerebrovascular accident)   AKI (acute kidney injury) (HCC)   Normocytic anemia   Zachary Underwood is a 89 y.o. male with medical history significant for persistent atrial fibrillation on Xarelto , history of CVA, moderate aortic stenosis, s/p surgical repair of ascending thoracic aorta, HTN, HLD, BPH, chronic lower extremity edema who is admitted with a displaced type II odontoid fracture.  Neurosurgery recommended nonoperative management at this point.  Assessment and Plan: Type II posterior displaced odontoid fracture occurring after mechanical fall C1 fracture: Neurosurgery recommending nonoperative management at this time.  Holding Xarelto .  [place] J collar in place.  Ongoing management as per neurosurgery.  Persistent atrial fibrillation: Remains in atrial fibrillation, bradycardic.  Xarelto  on hold.  He was given Kcentra in the ED.  Acute kidney injury: Mild with creatinine 1.38 on admission compared to baseline closer to ~1.0.  Continue gentle IV fluid hydration overnight.  Holding candesartan and Celebrex for now.  History of CVA: Xarelto  on hold as above.  Continue atorvastatin .  Hypertension: Candesartan on hold as above.  Mild anemia: Hemoglobin 10.6.  Continue monitor.  Hyperlipidemia: Continue atorvastatin .  Chronic lower extremity edema: Holding torsemide  and metolazone  for now.   DVT prophylaxis: SCDs Start: 11/22/23 2105 Code Status: Full code, discussed with patient's spouse on admission Family Communication: Spouse at bedside Disposition Plan: From home, dispo pending clinical progress Consults called: Neurosurgery Severity of Illness: The appropriate patient status for this patient is INPATIENT. Inpatient status is judged to be reasonable and necessary in order to provide the required intensity of service to ensure the patient's safety.  The patient's presenting symptoms, physical exam findings, and initial radiographic and laboratory data in the context of their chronic comorbidities is felt to place them at high risk for further clinical deterioration. Furthermore, it is not anticipated that the patient will be medically stable for discharge from the hospital within 2 midnights of admission.   * I certify that at the point of admission it is my clinical judgment that the patient will require inpatient hospital care spanning beyond 2 midnights from the point of admission due to high intensity of service, high risk for further deterioration and high frequency of surveillance required.DEWAINE Jorie Blanch MD [facility]  If 7PM-7AM, please contact night-coverage [email or web address]  11/22/2023, 9:43 PM

## 2023-11-22 NOTE — Hospital Course (Signed)
 Zachary Underwood is a 89 y.o. male with medical history significant for persistent atrial fibrillation on Xarelto , history of CVA, moderate aortic stenosis, s/p surgical repair of ascending thoracic aorta, HTN, HLD, BPH, chronic lower extremity edema who is admitted with a displaced type II odontoid fracture.  Neurosurgery recommended nonoperative management at this point.

## 2023-11-22 NOTE — ED Notes (Signed)
 [place] J applied with Dr. Simon at head of bed maintaining c spine.

## 2023-11-22 NOTE — ED Notes (Signed)
 Pt taken to MRI with Ashleigh RN. Pt to be transferred to short stay at completion of MRI. Wife taken with all of pt belongings to surgical waiting area by Arnaldo RN. Ashleigh RN made aware of wife change of location and request to speak with neurosurgery to be able to let surgical nurses know when pt is transferring to short stay.

## 2023-11-22 NOTE — Progress Notes (Addendum)
 The surgery was canceled per Dr. Lanis. Patient was transferred in 4N07 by short stay staff. Patient's wife at bedside. Report was given to the RN in 4N.

## 2023-11-22 NOTE — ED Provider Notes (Signed)
 [place] EMERGENCY DEPARTMENT AT [facility] Provider Note   CSN: [id number] Arrival date & time: 11/22/23  1210     Patient presents with: Zachary Underwood is a 89 y.o. male.   HPI   Patient presents because of mechanical fall.  Currently taken Xarelto  in the setting of atrial fibrillation.  According to EMS, patient was try to get in his car when he subsequently slipped and fell and hit his head.  Did not lose consciousness.  Endorsing neck pain.   Upon arrival to the ED, patient states he is having neck pain in the posterior aspect of his neck.  No numbness or tingling aware.  No chest pain or shortness of breath.  No nausea vomit diarrhea.   Patient denies any kind of chest pain before or after the fall.    Prior to Admission medications   Medication Sig Start Date End Date Taking? Authorizing Provider  tamsulosin  (FLOMAX ) 0.4 MG CAPS capsule Take 0.4 mg by mouth at bedtime.   Yes [provider]  atorvastatin  (LIPITOR) 10 MG tablet Take 5 mg by mouth at bedtime. 06/07/23   [provider]  candesartan (ATACAND) 4 MG tablet Take 0.5 tablets by mouth at bedtime. 06/07/23   [provider]  ferrous sulfate 325 (65 FE) MG EC tablet Take 325 mg by mouth daily.    [provider]  metolazone  (ZAROXOLYN ) 2.5 MG tablet Take 1 tablet (2.5 mg total) by mouth daily. 11/06/23   Court Dorn PARAS, MD  Multiple Vitamins-Minerals (MULTIVITAMIN WITH MINERALS) tablet Take 0.5 tablets by mouth daily.    [provider]  Rivaroxaban  (XARELTO ) 15 MG TABS tablet Take 15 mg by mouth daily with supper.    [provider]  torsemide  (DEMADEX ) 20 MG tablet Take 1 tablet (20 mg total) by mouth daily. 11/06/23   Court Dorn PARAS, MD  traMADol  (ULTRAM ) 50 MG tablet Take 50 mg by mouth daily as needed for moderate pain (pain score 4-6) or severe pain (pain score 7-10). 07/04/23   [provider]    Allergies: Short ragweed  pollen ext, Beef-derived drug products, Eliquis  [apixaban ], Lasix [furosemide], Other, and Tylenol [acetaminophen]    Review of Systems  Constitutional:  Negative for chills and fever.  HENT:  Negative for ear pain and sore throat.   Eyes:  Negative for pain and visual disturbance.  Respiratory:  Negative for cough and shortness of breath.   Cardiovascular:  Negative for chest pain and palpitations.  Gastrointestinal:  Negative for abdominal pain and vomiting.  Genitourinary:  Negative for dysuria and hematuria.  Musculoskeletal:  Negative for arthralgias and back pain.  Skin:  Negative for color change and rash.  Neurological:  Negative for seizures and syncope.  All other systems reviewed and are negative.   Updated Vital Signs BP (!) 168/66 (BP Location: Right Arm)   Pulse (!) 49   Temp (!) 97.5 F (36.4 C) (Oral)   Resp 16   Ht 5' 8 (1.727 m)   Wt 84.4 kg   SpO2 93%   BMI 28.28 kg/m   Physical Exam Vitals and nursing note reviewed.  Constitutional:      General: He is not in acute distress.    Appearance: He is well-developed.  HENT:     Head: Normocephalic and atraumatic.  Eyes:     Conjunctiva/sclera: Conjunctivae normal.  Cardiovascular:     Rate and Rhythm: Normal rate and regular rhythm.  Heart sounds: No murmur heard. Pulmonary:     Effort: Pulmonary effort is normal. No respiratory distress.     Breath sounds: Normal breath sounds.  Abdominal:     Palpations: Abdomen is soft.     Tenderness: There is no abdominal tenderness.  Musculoskeletal:        General: No swelling.       Arms:     Cervical back: Neck supple.  Skin:    General: Skin is warm and dry.     Capillary Refill: Capillary refill takes less than 2 seconds.  Neurological:     Mental Status: He is alert.  Psychiatric:        Mood and Affect: Mood normal.     (all labs ordered are listed, but only abnormal results are displayed) Labs Reviewed  COMPREHENSIVE METABOLIC PANEL WITH  GFR - Abnormal; Notable for the following components:      Result Value   Glucose, Bld 137 (*)    BUN 27 (*)    Creatinine, Ser 1.38 (*)    Total Protein 6.1 (*)    Albumin 3.3 (*)    GFR, Estimated 47 (*)    All other components within normal limits  CBC - Abnormal; Notable for the following components:   RBC 3.47 (*)    Hemoglobin 10.6 (*)    HCT 33.4 (*)    All other components within normal limits  PROTIME-INR - Abnormal; Notable for the following components:   Prothrombin  Time 23.6 (*)    INR 2.0 (*)    All other components within normal limits  APTT - Abnormal; Notable for the following components:   aPTT 67 (*)    All other components within normal limits  CBC - Abnormal; Notable for the following components:   RBC 3.43 (*)    Hemoglobin 10.5 (*)    HCT 32.8 (*)    All other components within normal limits  BASIC METABOLIC PANEL WITH GFR - Abnormal; Notable for the following components:   Glucose, Bld 111 (*)    BUN 26 (*)    GFR, Estimated 57 (*)    All other components within normal limits  I-STAT CHEM 8, ED - Abnormal; Notable for the following components:   BUN 27 (*)    Creatinine, Ser 1.40 (*)    Glucose, Bld 137 (*)    Hemoglobin 10.2 (*)    HCT 30.0 (*)    All other components within normal limits  SURGICAL PCR SCREEN  ETHANOL  URINALYSIS, ROUTINE W REFLEX MICROSCOPIC  I-STAT CG4 LACTIC ACID, ED  TYPE AND SCREEN  ABO/RH    EKG: EKG Interpretation Date/Time:  Friday November 22 2023 12:25:58 EDT Ventricular Rate:  56 PR Interval:    QRS Duration:  129 QT Interval:  457 QTC Calculation: 442 R Axis:   61  Text Interpretation: Atrial fibrillation Right bundle branch block Confirmed by Simon Rea [phone]) on 11/22/2023 1:40:39 PM  Radiology:      Procedures   Medications Ordered in the ED  HYDROmorphone  (DILAUDID ) injection 0.5 mg (0.5 mg Intravenous Given 11/23/23 0428)  sodium chloride  flush (NS) 0.9 % injection 3 mL (3 mLs Intravenous Given  11/22/23 2119)  lactated ringers  infusion ( Intravenous New Bag/Given 11/22/23 2119)  ondansetron  (ZOFRAN ) tablet 4 mg (has no administration in time range)    Or  ondansetron  (ZOFRAN ) injection 4 mg (has no administration in time range)  senna-docusate (Senokot-S) tablet 1 tablet (has no administration in time range)  atorvastatin  (LIPITOR) tablet 5 mg (has no administration in time range)  tamsulosin  (FLOMAX ) capsule 0.4 mg (has no administration in time range)  Tdap (BOOSTRIX ) injection 0.5 mL (0.5 mLs Intramuscular Given 11/22/23 1235)  iohexol  (OMNIPAQUE ) 350 MG/ML injection 75 mL (75 mLs Intravenous Contrast Given 11/22/23 1308)  morphine  (PF) 2 MG/ML injection 1 mg (1 mg Intravenous Given 11/22/23 1347)  morphine  (PF) 2 MG/ML injection 2 mg (2 mg Intravenous Given 11/22/23 1430)  prothrombin  complex conc human (KCENTRA ) IVPB 4,039 Units (0 Units Intravenous Stopped 11/22/23 1504)  HYDROmorphone  (DILAUDID ) injection 0.5 mg (0.5 mg Intravenous Given 11/22/23 1748)    Clinical Course as of 11/23/23 0743  Fri Nov 22, 2023  1407 Posterior cervical fusion.   Hosp admit. Progressive floor.   Can go in [place] J collar  [TL]  1721 Patient was taken to MRI and then moved to short stay per neurology recommendations. Patient was paged out to assigned hospitalist apparently incorrectly after discussing with hospitalist. Repaging out to unassigned hospitalist.  [HN]  1722 Received call from radiology about MRI. Re-demonstrated known displaced dens fracture. No epidural hematoma noted. No severe cord narrowing or signal abnormality there. Few areas of ligamentous injury including the ALL and PLL at C2. Posterior atlantoaxial membrane is disrupted and ligamentum flavum at C2-3 and C3-4 w/ edema, may be injured as well.  [HN]    Clinical Course User Index [HN] Franklyn Sid SAILOR, MD [TL] Simon Lavonia SAILOR, MD                                 Medical Decision Making Amount and/or Complexity of Data Reviewed Labs:  ordered. Radiology: ordered.  Risk Prescription drug management. Decision regarding hospitalization.   Patient presents because of mechanical fall.  Currently taken Xarelto  in the setting of atrial fibrillation.  According to EMS, patient was try to get in his car when he subsequently slipped and fell and hit his head.  Did not lose consciousness.  Endorsing neck pain.   Upon arrival to the ED, patient states he is having neck pain in the posterior aspect of his neck.  No numbness or tingling aware.  No chest pain or shortness of breath.  No nausea vomit diarrhea.   Patient denies any kind of chest pain before or after the fall.     Upon exam, patient ANO x 3 GCS 15.  Look around spontaneously.  Obeys all commands.  Communicating.  Airway intact.  Bilateral breath sounds.  Positive femoral pulses.   Patient does have a large laceration to the superior aspect of his head along the frontal bone.  Patient also has pain to palpation along the cervical spine.  No obvious strength or sensation changes on exam.  No T or L pain to palpation.  Patient has intact rectal tone.  Strength and sensation intact in bilateral upper and lower extremities.   Imaging obtained.  Consistent for CT fracture.  Small amount of hematoma around this as well.  Remains neuro intact.  I did speak to neurosurgery.  Recommended anticoagulation reversal given plan for OR today pending MRI of the cervical spine.  Recommended J collar.  J collar was placed.  Discussed with pharmacy, subsequently reversed the patient with Kcentra .  Initially discussed reversing with Andexxa but given need for OR likely this afternoon, wanted to reverse it faster.  Therefore, gave Kcentra .   Neurosurgery plans for OR this afternoon.  Request  admission to medicine stepdown service.   In terms of the patient's laceration to the head.  Will need to be repaired.  Will hold on repairing until patient's back from MRI.  Spoke to neurosurgery  about this as well.  They can repair in the OR if this is not completed down in the ED.  Patient remained neuro intact throughout the ED stay.  Full spinal precautions in terms of her cervical spine was adhered to throughout the ED stay.  Anytime that we move the patient or change the collars, neck was held still full spinal precautions.  He remained neurologically intact.  CRITICAL CARE Performed by: Lavonia LOISE Pat   Total critical care time: 45 minutes  Critical care time was exclusive of separately billable procedures and treating other patients.  Critical care was necessary to treat or prevent imminent or life-threatening deterioration.  Critical care was time spent personally by me on the following activities: development of treatment plan with patient and/or surrogate as well as nursing, discussions with consultants, evaluation of patient's response to treatment, examination of patient, obtaining history from patient or surrogate, ordering and performing treatments and interventions, ordering and review of laboratory studies, ordering and review of radiographic studies, pulse oximetry and re-evaluation of patient's condition.          Final diagnoses:  Laceration of scalp, initial encounter  Closed displaced fracture of second cervical vertebra, unspecified fracture morphology, initial encounter [facility])  Fall, initial encounter    ED Discharge Orders     None          Pat Lavonia LOISE, MD 11/23/23 [phone]

## 2023-11-22 NOTE — Plan of Care (Signed)
 Zachary Underwood is a 89 year old male with pmh prior CVA, permanent A-fib on Xarelto , aortic atherosclerosis, hyperlipidemia, hypertension, moderate aortic valve stenosis, chronic bilateral knee pain who presents after having a fall and had been found to have a dens fracture with significant ligamentous injury with concern for hematoma which was ruled out on MRI. Neurosurgery consulted and placed orders to take to surgery.  Patient had received Tdap booster and was given Kcentra for reversal of anticoagulation.  Request admission to a stepdown  following surgery for which orders were placed.

## 2023-11-22 NOTE — Progress Notes (Signed)
 Orthopedic Tech Progress Note Patient Details:  Zachary Underwood [birth year] [id number]  Patient ID: [name], male   DOB: 03-Jul-1927, 89 y.o.   MRN: [id number] Respnd top level 2 trauma. Not needed. Adine MARLA Blush 11/22/2023, 12:28 PM

## 2023-11-22 NOTE — ED Notes (Signed)
 Trauma Response Nurse Documentation   Zachary Underwood is a 89 y.o. male arriving to [place] ED via [facility]  On Xarelto  (rivaroxaban ) daily. Trauma was activated as a Level 2 by Charge RN based on the following trauma criteria Elderly patients > 65 with head trauma on anti-coagulation (excluding ASA).  Patient cleared for CT by Dr. Jackquline. Pt transported to CT with primary nurse present to monitor. RN remained with the patient throughout their absence from the department for clinical observation.   GCS 15.    History   Past Medical History:  Diagnosis Date   Arthritis    Cardiac arrhythmia    Hypertension    Ruptured aortic aneurysm [facility])    Stroke (HCC) 08/23/2021     Past Surgical History:  Procedure Laterality Date   open heart surgery     ruptured aortic aneurysm repair         Initial Focused Assessment (If applicable, or please see trauma documentation): Airway - clear Breathing - unlabored Circulation - bleeding noted to top of head from skin tear/laceration, controlled with bandage GCS - 15    CT's Completed:   CT Head, CT Maxillofacial, CT C-Spine, CT Chest w/ contrast, and CT abdomen/pelvis w/ contrast   Interventions:  MRI Labs Xrays Pain control Tdap  Plan for disposition:  Admission to Progressive Care   Consults completed:  Neurosurgeon   Event Summary: Pt was getting into car with care giver and last his balance, falling to the ground. Denies LOC, wife is with pt.  C-collar on per EMS, exchanged to [place] J per primary RN and EDP after Ct scan Pt c/o severe neck pain, has full ROM and sensation in extremities.  Logrolled after port xrays, with c-spine precautions in place.    Zachary Underwood  Trauma Response RN  Please call TRN at [phone] for further assistance.

## 2023-11-22 NOTE — Anesthesia Preprocedure Evaluation (Addendum)
 Anesthesia Evaluation  Patient identified by MRN, date of birth, ID band Patient awake    Reviewed: Allergy & Precautions, NPO status , Patient's Chart, lab work & pertinent test results  Airway Mallampati: III   Neck ROM: Limited    Dental no notable dental hx. (+) Teeth Intact, Caps, Dental Advisory Given   Pulmonary former smoker   Pulmonary exam normal breath sounds clear to auscultation       Cardiovascular hypertension, Pt. on medications + Peripheral Vascular Disease  + dysrhythmias Atrial Fibrillation  Rhythm:Irregular Rate:Bradycardia  Hx/o carotid stenosis  Hx/o Thoracic Aortic aneurysm/dissection repaired 2012  Echo 07/25/23  1. Left ventricular ejection fraction, by estimation, is >75%. Left  ventricular ejection fraction by PLAX is 78 %. The left ventricle has  hyperdynamic function. The left ventricle has no regional wall motion  abnormalities. There is moderate left  ventricular hypertrophy. Left ventricular diastolic function could not be  evaluated.   2. Left atrial size was severely dilated.   3. Right atrial size was severely dilated.   4. The mitral valve is degenerative. Trivial mitral valve regurgitation.  Severe mitral annular calcification.   5. There is moderate calcification of the aortic valve. Suspect at least  moderate aortic valve stenosis, however the valve was not completely  evaluated during this study.   6. Aortic dilatation noted. There is mild dilatation of the ascending  aorta, measuring 41 mm.   7. Tricuspid regurgitation signal is inadequate for assessing PA  pressure.   8. The inferior vena cava is normal in size with greater than 50%  respiratory variability, suggesting right atrial pressure of 3 mmH    EKG 11/22/23 Atrial fibrillation 56/min, RBBB pattern    Neuro/Psych TIACVA, No Residual Symptoms  negative psych ROS   GI/Hepatic negative GI ROS, Neg liver ROS,,,  Endo/Other   HLD  Renal/GU Renal InsufficiencyRenal disease   BPH    Musculoskeletal  (+) Arthritis , Osteoarthritis,  1. Displaced type II odontoid fracture with approximately 12 mm posterior displacement relative to the body of C2. Associated prevertebral soft tissue swelling and possible epidural hematoma. There is at least moderate spinal canal stenosis at C1-2. Recommend MRI cervical spine for further evaluation. 2. Additional mildly displaced fractures through the posterior arch of C1 and an additional nondisplaced fracture through the midline anterior arch of C1. 3. Similar anterolisthesis of C5 on C6 likely related to degenerative changes.       Abdominal   Peds  Hematology Xarelto  therapy- last dose 7/31 pm   Anesthesia Other Findings   Reproductive/Obstetrics                              Anesthesia Physical Anesthesia Plan  ASA: 3 and emergent  Anesthesia Plan: General   Post-op Pain Management: Minimal or no pain anticipated   Induction: Intravenous  PONV Risk Score and Plan: 3 and Treatment may vary due to age or medical condition, Ondansetron, Propofol infusion and TIVA  Airway Management Planned: Oral ETT and Video Laryngoscope Planned  Additional Equipment: None and Arterial line  Intra-op Plan:   Post-operative Plan: Extubation in OR  Informed Consent: I have reviewed the patients History and Physical, chart, labs and discussed the procedure including the risks, benefits and alternatives for the proposed anesthesia with the patient or authorized representative who has indicated his/her understanding and acceptance.     Dental advisory given  Plan Discussed with: Anesthesiologist and CRNA  Anesthesia Plan Comments:          Anesthesia Quick Evaluation

## 2023-11-22 NOTE — Consult Note (Signed)
 Chief Complaint   Chief Complaint  Patient presents with   Fall    History of Present Illness  Zachary Underwood is a 89 y.o. male  Seen in consultation after presenting to the emergency department after a fall. Patient is amnestic to the events, however does report that after this fall, he had severe neck, pain, and some bleeding from his forehead. Upon his arrival to the emergency department has his work up, included CT scan of the head, which demonstrated a displaced Odontoid Fracture. Neurosurgical consultation was therefore requested. Currently, the patient does not report any numbness, tingling or weakness of the extremities. He does, however continue to complain of relatively severe neck pain.   Of note, the patient has a history of medically, controlled, hypertension, history of ruptured, thoracic, aortic aneurysm, and atrial fibrillation currently on Xarelto . In addition, he does have chronic kidney disease. He has a history of stroke. No history of heart attack. No no lung or liver disease. He does apparently have a diagnosis of osteopenia. No cancer history. He is a non-smoker.  Past Medical History   Past Medical History:  Diagnosis Date   Arthritis    Cardiac arrhythmia    Hypertension    Ruptured aortic aneurysm (HCC)    Stroke (HCC) 08/23/2021    Past Surgical History   Past Surgical History:  Procedure Laterality Date   open heart surgery     ruptured aortic aneurysm repair      Social History   Social History   Tobacco Use   Smoking status: Former    Types: Cigarettes   Smokeless tobacco: Never   Tobacco comments:    Former smoker 03/29/23  Vaping Use   Vaping status: Never Used  Substance Use Topics   Alcohol use: Never   Drug use: Never    Medications   Prior to Admission medications   Medication Sig Start Date End Date Taking? Authorizing Provider  tamsulosin  (FLOMAX ) 0.4 MG CAPS capsule Take 0.4 mg by mouth at bedtime.   Yes [provider]  atorvastatin  (LIPITOR) 10 MG tablet Take 5 mg by mouth at bedtime. 06/07/23   [provider]  candesartan (ATACAND) 4 MG tablet Take 0.5 tablets by mouth at bedtime. 06/07/23   [provider]  ferrous sulfate 325 (65 FE) MG EC tablet Take 325 mg by mouth daily.    [provider]  metolazone  (ZAROXOLYN ) 2.5 MG tablet Take 1 tablet (2.5 mg total) by mouth daily. 11/06/23   Court Dorn PARAS, MD  Multiple Vitamins-Minerals (MULTIVITAMIN WITH MINERALS) tablet Take 0.5 tablets by mouth daily.    [provider]  Rivaroxaban  (XARELTO ) 15 MG TABS tablet Take 15 mg by mouth daily with supper.    [provider]  torsemide  (DEMADEX ) 20 MG tablet Take 1 tablet (20 mg total) by mouth daily. 11/06/23   Court Dorn PARAS, MD  traMADol  (ULTRAM ) 50 MG tablet Take 50 mg by mouth daily as needed for moderate pain (pain score 4-6) or severe pain (pain score 7-10). 07/04/23   [provider]    Allergies   Allergies  Allergen Reactions   Short Ragweed Pollen Ext Other (See Comments)    Unknown    Beef-Derived Drug Products Other (See Comments)    Patient does not eat beef    Eliquis  [Apixaban ] Itching   Lasix [Furosemide] Itching   Other Other (See Comments) and Hypertension    Fusid- Legs became very swollen   Tylenol [Acetaminophen]  Hives    Review of Systems  ROS  Neurologic Exam  Awake, alert, oriented Speech fluent, appropriate CN grossly intact Motor exam: Upper Extremities Deltoid Bicep Tricep Grip  Right 5/5 5/5 5/5 5/5  Left 5/5 5/5 5/5 5/5   Lower Extremities IP Quad PF DF EHL  Right 5/5 5/5 5/5 5/5 5/5  Left 5/5 5/5 5/5 5/5 5/5   Sensation grossly intact to LT  Imaging  CT C-spine was personally reviewed and demonstrates a displaced, angulated type 2 odontoid fracture with associated near posterior spondyloptosis of C1 on C2. There is autofusion of C3-4. MRI also personally reviewed and demonstrates some  component of sub-ligamentous hematoma but no spinal cord compression at C1-2.  Impression  - 89 y.o. male s/p fall with significantly displaced type 2 odontoid fracture. Thankfully, the patient is neurologically intact. While surgery may somewhat reduce the chance of future neurological compromise, I think the most likely outcome of any posterior stabilization surgery is a lengthy difficult convalescence  likely to result in functional dependence in a nursing facility or worse. I therefore think the best management option in this intact patient is non-operative.   Plan  - Would maintain [place]-J collar at all times.   - Admit to medicine service for supportive care - Can consider mobilizing tomorrow with PT/OT - Cont to hold Xarelto  for now.   I have had a lengthy discussion with the patient in his daughter at bedside. We have discussed options for treatment of this fracture as above. I did convey my recommendation for non-operative management given the likelihood of poor outcome with surgical treatment. We did discuss The likely need for cervical collar for an extended period of time. At this point, patient and family have agreed to observation and therapy evaluation for now. All their questions this afternoon were answered.  Gerldine Maizes, MD [facility]

## 2023-11-22 NOTE — ED Triage Notes (Signed)
 Pt presents to ED form home. Attempting to get in vehicle with caregiver. Mechanical fall while trying to get in vehicle. Hit head. Laceration to head. Complains of pain in the back of neck. On Xarelto . Hard of hearing, Hearing aids in place. GCS 15.  100 fentanyl  given enroute

## 2023-11-23 DIAGNOSIS — S0101XA Laceration without foreign body of scalp, initial encounter: Secondary | ICD-10-CM | POA: Diagnosis not present

## 2023-11-23 DIAGNOSIS — R001 Bradycardia, unspecified: Secondary | ICD-10-CM

## 2023-11-23 DIAGNOSIS — I2510 Atherosclerotic heart disease of native coronary artery without angina pectoris: Secondary | ICD-10-CM

## 2023-11-23 DIAGNOSIS — I5032 Chronic diastolic (congestive) heart failure: Secondary | ICD-10-CM | POA: Diagnosis not present

## 2023-11-23 DIAGNOSIS — R600 Localized edema: Secondary | ICD-10-CM

## 2023-11-23 DIAGNOSIS — S12100A Unspecified displaced fracture of second cervical vertebra, initial encounter for closed fracture: Secondary | ICD-10-CM

## 2023-11-23 DIAGNOSIS — E7800 Pure hypercholesterolemia, unspecified: Secondary | ICD-10-CM

## 2023-11-23 DIAGNOSIS — I4819 Other persistent atrial fibrillation: Secondary | ICD-10-CM

## 2023-11-23 DIAGNOSIS — D649 Anemia, unspecified: Secondary | ICD-10-CM

## 2023-11-23 DIAGNOSIS — I7121 Aneurysm of the ascending aorta, without rupture: Secondary | ICD-10-CM

## 2023-11-23 DIAGNOSIS — W19XXXA Unspecified fall, initial encounter: Secondary | ICD-10-CM | POA: Diagnosis not present

## 2023-11-23 DIAGNOSIS — I482 Chronic atrial fibrillation, unspecified: Secondary | ICD-10-CM

## 2023-11-23 LAB — RETICULOCYTES
Immature Retic Fract: 18.5 % — ABNORMAL HIGH (ref 2.3–15.9)
RBC.: 3.58 MIL/uL — ABNORMAL LOW (ref 4.22–5.81)
Retic Count, Absolute: 77.3 K/uL (ref 19.0–186.0)
Retic Ct Pct: 2.2 % (ref 0.4–3.1)

## 2023-11-23 LAB — BASIC METABOLIC PANEL WITH GFR
Anion gap: 11 (ref 5–15)
BUN: 26 mg/dL — ABNORMAL HIGH (ref 8–23)
CO2: 22 mmol/L (ref 22–32)
Calcium: 9.4 mg/dL (ref 8.9–10.3)
Chloride: 105 mmol/L (ref 98–111)
Creatinine, Ser: 1.19 mg/dL (ref 0.61–1.24)
GFR, Estimated: 57 mL/min — ABNORMAL LOW (ref >60)
Glucose, Bld: 111 mg/dL — ABNORMAL HIGH (ref 70–99)
Potassium: 4 mmol/L (ref 3.5–5.1)
Sodium: 138 mmol/L (ref 135–145)

## 2023-11-23 LAB — CBC
HCT: 32.8 % — ABNORMAL LOW (ref 39.0–52.0)
Hemoglobin: 10.5 g/dL — ABNORMAL LOW (ref 13.0–17.0)
MCH: 30.6 pg (ref 26.0–34.0)
MCHC: 32 g/dL (ref 30.0–36.0)
MCV: 95.6 fL (ref 80.0–100.0)
Platelets: 163 K/uL (ref 150–400)
RBC: 3.43 MIL/uL — ABNORMAL LOW (ref 4.22–5.81)
RDW: 13.8 % (ref 11.5–15.5)
WBC: 7.4 K/uL (ref 4.0–10.5)
nRBC: 0 % (ref 0.0–0.2)

## 2023-11-23 LAB — FERRITIN: Ferritin: 216 ng/mL (ref 24–336)

## 2023-11-23 LAB — FOLATE: Folate: 29.6 ng/mL (ref >5.9)

## 2023-11-23 LAB — VITAMIN B12: Vitamin B-12: 1112 pg/mL — ABNORMAL HIGH (ref 180–914)

## 2023-11-23 LAB — VITAMIN D 25 HYDROXY (VIT D DEFICIENCY, FRACTURES): Vit D, 25-Hydroxy: 36.85 ng/mL (ref 30–100)

## 2023-11-23 LAB — IRON AND TIBC
Iron: 30 ug/dL — ABNORMAL LOW (ref 45–182)
Saturation Ratios: 10 % — ABNORMAL LOW (ref 17.9–39.5)
TIBC: 301 ug/dL (ref 250–450)
UIBC: 271 ug/dL

## 2023-11-23 MED ORDER — PANTOPRAZOLE SODIUM 40 MG IV SOLR
40.0000 mg | INTRAVENOUS | Status: DC
  Administered 2023-11-23: 40 mg via INTRAVENOUS
  Filled 2023-11-23: qty 10

## 2023-11-23 MED ORDER — FLUTICASONE PROPIONATE 50 MCG/ACT NA SUSP
2.0000 | Freq: Every day | NASAL | Status: DC
  Administered 2023-11-23 – 2023-11-28 (×6): 2 via NASAL
  Filled 2023-11-23: qty 16

## 2023-11-23 MED ORDER — HYDROMORPHONE HCL 1 MG/ML IJ SOLN
1.0000 mg | INTRAMUSCULAR | Status: DC | PRN
  Administered 2023-11-26: 1 mg via INTRAVENOUS
  Filled 2023-11-23 (×2): qty 1

## 2023-11-23 MED ORDER — SALINE SPRAY 0.65 % NA SOLN: 1.0000 | NASAL | Status: DC | PRN

## 2023-11-23 MED ORDER — CELECOXIB 200 MG PO CAPS
200.0000 mg | ORAL_CAPSULE | Freq: Two times a day (BID) | ORAL | Status: DC
  Administered 2023-11-23 – 2023-11-28 (×11): 200 mg via ORAL
  Filled 2023-11-23 (×12): qty 1

## 2023-11-23 MED ORDER — ENSURE PLUS HIGH PROTEIN PO LIQD
237.0000 mL | Freq: Two times a day (BID) | ORAL | Status: DC
  Administered 2023-11-23 – 2023-11-28 (×7): 237 mL via ORAL

## 2023-11-23 MED ORDER — METHOCARBAMOL 1000 MG/10ML IJ SOLN
500.0000 mg | Freq: Three times a day (TID) | INTRAMUSCULAR | Status: DC
  Administered 2023-11-23 – 2023-11-28 (×16): 500 mg via INTRAVENOUS
  Filled 2023-11-23 (×6): qty 10
  Filled 2023-11-23: qty 5
  Filled 2023-11-23 (×7): qty 10

## 2023-11-23 MED ORDER — OXYCODONE HCL 5 MG PO TABS
5.0000 mg | ORAL_TABLET | ORAL | Status: DC | PRN
  Administered 2023-11-24 – 2023-11-26 (×3): 5 mg via ORAL
  Filled 2023-11-23 (×5): qty 1

## 2023-11-23 MED ORDER — TORSEMIDE 20 MG PO TABS
20.0000 mg | ORAL_TABLET | Freq: Every day | ORAL | Status: DC
  Filled 2023-11-23 (×2): qty 1

## 2023-11-23 MED ORDER — HYDROMORPHONE HCL 1 MG/ML IJ SOLN
0.5000 mg | Freq: Once | INTRAMUSCULAR | Status: AC
  Administered 2023-11-23: 0.5 mg via INTRAVENOUS
  Filled 2023-11-23: qty 0.5

## 2023-11-23 MED ORDER — HYDRALAZINE HCL 20 MG/ML IJ SOLN: 10.0000 mg | Freq: Four times a day (QID) | INTRAMUSCULAR | Status: DC | PRN

## 2023-11-23 MED ORDER — LACTATED RINGERS IV SOLN: INTRAVENOUS | Status: DC

## 2023-11-23 MED ORDER — IRBESARTAN 75 MG PO TABS
37.5000 mg | ORAL_TABLET | Freq: Every day | ORAL | Status: DC
  Administered 2023-11-23 – 2023-11-28 (×6): 37.5 mg via ORAL
  Filled 2023-11-23 (×6): qty 0.5

## 2023-11-23 NOTE — Plan of Care (Signed)
   Problem: Education: Goal: Knowledge of General Education information will improve Description: Including pain rating scale, medication(s)/side effects and non-pharmacologic comfort measures Outcome: Progressing   Problem: Safety: Goal: Ability to remain free from injury will improve Outcome: Progressing   Problem: Skin Integrity: Goal: Risk for impaired skin integrity will decrease Outcome: Progressing

## 2023-11-23 NOTE — Consult Note (Addendum)
 Cardiology Consultation:   Patient ID: Zachary Underwood MRN: [id number]; DOB: 1927/10/04  Admit date: 11/22/2023 Date of Consult: 11/23/2023  Primary Care Provider: [facility], [name] Cardiologist: Dorn Lesches, MD  [facility] Electrophysiologist:  None    Patient Profile:   Zachary Underwood is a 89 y.o. 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     History of Present Illness:   Zachary Underwood is a 89 year old male with a history of atrial fibrillation and CVA.  He is followed by Dr. Wadie in our office.  He is a PhD in Chartered loss adjuster.  His wife has a PhD and is a Geographical information systems officer.  They moved from [place] in 2023.  He has never had a heart attack.  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  He does have chronic A-fib and is on Xarelto .  He was last seen by Dr. Wadie on 11/06/2023 and was doing well.  He walks with a cane after a knee fracture.  He was completely asymptomatic otherwise from a cardiac standpoint at that time except for some lower extremity edema on occasion.  His heart rate was adequately think controlled but did wear a monitor a year ago showing some pauses up to 4.6 seconds but pacemaker was not recommended at that time.  He has a history of moderate aortic stenosis by 2D echo 03/2023.  He has been maintained on metolazone  and torsemide  for his lower extremity edema.  He was in his usual state of health but unfortunately was trying to get into his car on 11/22/2023 and fell and hit his head and neck.  He did not lose consciousness but was having a lot of back pain and neck pain on arrival.  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  On exam BP was 137/58 with a heart rate of 107 and O2 sats 97% on room air.  Hemoglobin 10.6, platelet count 170, sodium 138 Rheumatac potassium 3.9, serum  creatinine 1.38.  CT of the head showed a frontoparietal scalp laceration and hematoma and general cerebral atrophy with known chronic infarcts within the left cerebellar hemisphere.  Maxillofacial CT showed acute cervical spine fractures.    CT of the chest/abdomen/pelvis showed a 4.7 ascending aortic aneurysm following surgical repair.  Coronary artery calcifications were noted as well.  CT of the cervical spine showed a displaced type II odontoid fracture with associated paravertebral soft tissue swelling and epidural hematoma.  Moderate spinal canal stenosis was present at C1-C2.  There was a displaced fracture in the posterior arch of C1 and midline anterior arch C1.  MRI of the cervical spine showed displaced type II odontoid fracture with associated edema and possible bleed.  Neurosurgery was consulted.  Initial plan was to take to surgery but was that was canceled.  He was admitted to [facility].  Currently in [place] J collar is in place.    He has been in atrial fibrillation since admission and bradycardic.  His Xarelto  is on hold and he was given Kcentra .  Cardiology is now asked to consult for bradycardia.  Of note he did wear a heart monitor 03/20/2023 that showed sinus pauses up to 4 seconds occurring during sleep.  Pacemaker was not indicated at that time.  He denies any recent episodes of chest pain or pressure,  shortness of breath, PND, orthopnea, dizziness, palpitations or syncope.  He does have chronic lower extremity edema which is stable.   Past Medical History:  Diagnosis Date   Arthritis    Cardiac arrhythmia    Hypertension    Ruptured aortic aneurysm [facility])    Stroke (HCC) 08/23/2021    Past Surgical History:  Procedure Laterality Date   open heart surgery     ruptured aortic aneurysm repair       Home Medications:  Prior to Admission medications   Medication Sig Start Date End Date Taking? Authorizing Provider  atorvastatin  (LIPITOR) 10 MG tablet Take 5 mg by mouth at  bedtime. 06/07/23  Yes [provider]  candesartan (ATACAND) 4 MG tablet Take 0.5 tablets by mouth at bedtime. 06/07/23  Yes [provider]  ferrous sulfate 325 (65 FE) MG EC tablet Take 325 mg by mouth daily.   Yes [provider]  metolazone  (ZAROXOLYN ) 2.5 MG tablet Take 1 tablet (2.5 mg total) by mouth daily. 11/06/23  Yes Court Dorn PARAS, MD  Multiple Vitamins-Minerals (MULTIVITAMIN WITH MINERALS) tablet Take 0.5 tablets by mouth daily.   Yes [provider]  Rivaroxaban  (XARELTO ) 15 MG TABS tablet Take 15 mg by mouth daily with supper.   Yes [provider]  tamsulosin  (FLOMAX ) 0.4 MG CAPS capsule Take 0.4 mg by mouth at bedtime.   Yes [provider]  torsemide  (DEMADEX ) 20 MG tablet Take 1 tablet (20 mg total) by mouth daily. 11/06/23  Yes Court Dorn PARAS, MD  traMADol  (ULTRAM ) 50 MG tablet Take 50 mg by mouth daily as needed for moderate pain (pain score 4-6) or severe pain (pain score 7-10). 07/04/23  Yes [provider]    Inpatient Medications: Scheduled Meds:  atorvastatin   5 mg Oral QHS   celecoxib   200 mg Oral BID   feeding supplement  237 mL Oral BID BM   fluticasone   2 spray Each Nare Daily   methocarbamol  (ROBAXIN ) injection  500 mg Intravenous Q8H   pantoprazole  (PROTONIX ) IV  40 mg Intravenous Q24H   sodium chloride  flush  3 mL Intravenous Q12H   tamsulosin   0.4 mg Oral QHS   Continuous Infusions:  PRN Meds: hydrALAZINE , HYDROmorphone  (DILAUDID ) injection, ondansetron  **OR** ondansetron  (ZOFRAN ) IV, oxyCODONE , senna-docusate, sodium chloride   Allergies:    Allergies  Allergen Reactions   Short Ragweed Pollen Ext Other (See Comments)    Unknown    Beef-Derived Drug Products Other (See Comments)    Patient does not eat beef    Eliquis  [Apixaban ] Itching   Lasix [Furosemide] Itching   Other Other (See Comments) and Hypertension    Fusid- Legs became very swollen   Tylenol [Acetaminophen] Hives     Social History:   Social History   Socioeconomic History   Marital status: Married    Spouse name: [name] Hageman   Number of children: 4   Years of education: Not on file   Highest education level: Professional school degree (e.g., MD, DDS, DVM, JD)  Occupational History   Occupation: economist  Tobacco Use   Smoking status: Former    Types: Cigarettes   Smokeless tobacco: Never   Tobacco comments:    Former smoker 03/29/23  Vaping Use   Vaping status: Never Used  Substance and Sexual Activity   Alcohol use: Never   Drug use: Never   Sexual activity: Not Currently  Other Topics Concern   Not on file  Social History Narrative   Lives  with wife   Job Publishing copy    Social Drivers of Corporate investment banker Strain: Low Risk  (09/04/2021)   Overall Financial Resource Strain (CARDIA)    Difficulty of Paying Living Expenses: Not hard at all  Food Insecurity: No Food Insecurity (11/23/2023)   Hunger Vital Sign    Worried About Running Out of Food in the Last Year: Never true    Ran Out of Food in the Last Year: Never true  Transportation Needs: No Transportation Needs (11/23/2023)   PRAPARE - Administrator, Civil Service (Medical): No    Lack of Transportation (Non-Medical): No  Physical Activity: Not on file  Stress: No Stress Concern Present (09/04/2021)   [employer] of Occupational Health - Occupational Stress Questionnaire    Feeling of Stress : Only a little  Social Connections: Unknown (11/23/2023)   Social Connection and Isolation Panel    Frequency of Communication with Friends and Family: Twice a week    Frequency of Social Gatherings with Friends and Family: Patient declined    Attends Religious Services: Patient declined    Database administrator or Organizations: Yes    Attends Banker Meetings: 1 to 4 times per year    Marital Status: Married  Catering manager Violence: Not At Risk (11/23/2023)   Humiliation, Afraid, Rape,  and Kick questionnaire    Fear of Current or Ex-Partner: No    Emotionally Abused: No    Physically Abused: No    Sexually Abused: No    Family History:   History reviewed. No pertinent family history.   ROS:  Please see the history of present illness.   All other ROS reviewed and negative.     Physical Exam/Data:   Vitals:   11/23/23 0254 11/23/23 0354 11/23/23 1019 11/23/23 1329  BP: (!) 168/66  (!) 157/61 (!) 143/52  Pulse:  (!) 49    Resp:  16    Temp: (!) 97.5 F (36.4 C)  (!) 97.5 F (36.4 C) 97.8 F (36.6 C)  TempSrc: Oral  Axillary Oral  SpO2:  93%    Weight:      Height:       No intake or output data in the 24 hours ending 11/23/23 1416    11/22/2023   12:20 PM 11/06/2023    2:46 PM 07/24/2023    9:28 PM  Last 3 Weights  Weight (lbs) 186 lb 195 lb 9.6 oz 185 lb  Weight (kg) 84.369 kg 88.724 kg 83.915 kg     Body mass index is 28.28 kg/m.  General:  Well nourished, well developed, in no acute distress HEENT: normal Lymph: no adenopathy Neck: no JVD Endocrine:  No thryomegaly Vascular: No carotid bruits; FA pulses 2+ bilaterally without bruits  Cardiac:  normal S1, S2; RRR; no murmur  Lungs:  clear to auscultation bilaterally, no wheezing, rhonchi or rales  Abd: soft, nontender, no hepatomegaly  Ext: no edema Musculoskeletal:  No deformities, BUE and BLE strength normal and equal Skin: warm and dry  Neuro:  CNs 2-12 intact, no focal abnormalities noted Psych:  Normal affec  EKG:  The EKG was personally reviewed and demonstrates: Coarse atrial flutter/atrial fibrillation with slow ventricular response at 37 bpm Telemetry:  Telemetry was personally reviewed and demonstrates: Atrial flutter with slow ventricular response  Laboratory Data:  High Sensitivity Troponin:  No results for input(s): TROPONINIHS in the last 720 hours.   Chemistry Recent Labs  Lab 11/22/23 1221  11/22/23 1241 11/23/23 0553  NA 138 139 138  K 3.9 3.9 4.0  CL 107 105 105   CO2 24  --  22  GLUCOSE 137* 137* 111*  BUN 27* 27* 26*  CREATININE 1.38* 1.40* 1.19  CALCIUM  9.4  --  9.4  GFRNONAA 47*  --  57*  ANIONGAP 7  --  11    Recent Labs  Lab 11/22/23 1221  PROT 6.1*  ALBUMIN 3.3*  AST 31  ALT 19  ALKPHOS 107  BILITOT 1.1   Hematology Recent Labs  Lab 11/22/23 1221 11/22/23 1241 11/23/23 0553  WBC 7.0  --  7.4  RBC 3.47*  --  3.43*  HGB 10.6* 10.2* 10.5*  HCT 33.4* 30.0* 32.8*  MCV 96.3  --  95.6  MCH 30.5  --  30.6  MCHC 31.7  --  32.0  RDW 13.9  --  13.8  PLT 170  --  163   BNPNo results for input(s): BNP, PROBNP in the last 168 hours.  DDimer No results for input(s): DDIMER in the last 168 hours.   Radiology/Studies:     Assessment and Plan:   #Bradycardia #Chronic atrial fibrillation - Patient has history of a chronic atrial fibrillation and had pauses up to 4 seconds during sleep by a heart monitor last fall 2024 at which time permanent pacemaker was not recommended - Now having significant bradycardia during sleep and while awake - 2D echo 07/25/2023 with EF greater than 75% with moderate LVH, severe biatrial enlargement, moderate MAC and at least moderate aortic valve stenosis - Will repeat 2D echo to make sure that aortic stenosis has not progressed - He has not been on any rate slowing medications at home. -Patient states that he has not had any problems with dizziness, presyncope or syncope except for 1 episode about 10 days ago that was very short-lived.  He has had multiple falls and none of them have been precipitated by dizziness or presyncope or syncope. -He really is asymptomatic with his bradycardia.  His wife is at the bedside and is insistent that every time he has fallen and had an injury that he gets bradycardic but then once he has recovered from his injuries his heart rate goes back up to the normal range - Continued telemetry monitoring - Xarelto  currently on hold due to C1 fracture and displaced odontoid  fracture and received Kcentra  - Do not think there is any indication for temporary pacing at this time as he remains asymptomatic.  Will ask EP to evaluate  #Type II posterior displaced odontoid fracture occurring after mechanical fall with C1 fracture - Neurosurgery following currently recommending nonoperative management - Xarelto  on hold - [place] J collar in place  #Chronic diastolic CHF #Chronic lower extremity edema -He appears euvolemic on exam today -He was taking Demadex  and metolazone  as an outpatient -Found to be in AKI with a a serum creatinine of 1.4 on admission but serum creatinine is down to 1.1 today. -Would restart Demadex  20 mg daily without metolazone  and follow closely for volume overload  #Hypertension - Candesartan currently on hold - BP elevated at 147/116 mmHg - His renal function is normal so would recommend restarting his home dose of candesartan - No beta-blocker due to bradycardia  #Coronary artery calcifications - Noted on chest CT -2D echo 07/25/2023 with EF greater than 75% with moderate LVH - Denies anginal symptoms - Continue Lipitor 5 mg daily  #Ruptured aortic aneurysm/dissection  -status post repair in [place] -  CT of the chest showed a 4.7 cm aneurysmal dilatation of the ascending order with no dissection - This can be followed outpatient - Needs good control hypertension - Will restart his Demadex  and recommend restarting candesartan   CHA2DS2-VASc Score = 6   This indicates a 9.7% annual risk of stroke. The patient's score is based upon: CHF History: 0 HTN History: 1 Diabetes History: 0 Stroke History: 2 Vascular Disease History: 1 Age Score: 2 Gender Score: 0        For questions or updates, please contact [facility] Please consult [email or web address] for contact info under    Signed, Wilbert Bihari, MD  11/23/2023 2:16 PM

## 2023-11-23 NOTE — Progress Notes (Addendum)
 [facility]                                                                              Zachary Underwood, is a 89 y.o. male, DOB - 1928-02-21, FMW:[id number] Admit date - 11/22/2023    Outpatient Primary MD for the patient is Clinic, Zachary Underwood  LOS - 1  days  Chief Complaint  Patient presents with   Fall       Brief summary   Patient is a 89 y.o. male with persistent atrial fibrillation on Xarelto , history of CVA, moderate aortic stenosis, s/p surgical repair of ascending thoracic aorta, HTN, HLD, BPH, chronic lower extremity edema presented to the ED for evaluation after he fell and hit his head and neck. Patient was trying to get into his car earlier on the day of admission when he slipped and fell.  He hit his head and neck,did not lose consciousness.  He was having pain at the back of his neck, EMS was called. Patient is functional at baseline and continues to teach as a professor.  CT cervical spine without contrast IMPRESSION: 1. Displaced type II odontoid fracture with approximately 12 mm posterior displacement relative to the body of C2. Associated prevertebral soft tissue swelling and possible epidural hematoma. There is at least moderate spinal canal stenosis at C1-2. Recommend MRI cervical spine for further evaluation. 2. Additional mildly displaced fractures through the posterior arch of C1 and an additional nondisplaced fracture through the midline anterior arch of C1. 3. Similar anterolisthesis of C5 on C6 likely related to degenerative changes.  MRI cervical spine without contrast IMPRESSION: 1. Displaced type 2 odontoid fracture with significant posterior dislocation of the dens relative to the body of C2. Associated edema and possible blood products interposed between dens and C2. No definite epidural hematoma. 2. Displacement of dens as well as malalignment of the bilateral atlantoaxial articulations results in moderate spinal canal stenosis and  ventral cervical cord flattening. 3. Disruption of the anterior longitudinal ligament and posterior longitudinal ligament at C2. Edema along the posterior atlantoaxial membrane suggestive of ligamentous injury. Possible disruption of the ligamentum flavum at C2-3 and C3-4. 4. C1 fractures better seen on CT. 5. Significant prevertebral edema. 6. Disc osteophyte complexes at C5-6 contributing to mild spinal canal stenosis.  Assessment & Plan    Type II posterior displaced odontoid fracture occurring after mechanical fall C1 fracture: - Neurosurgery consulted, evaluated by Zachary Underwood, recommended non-operative management at this time.    - Holding Xarelto . Recommended Cervical collar, PT, rehab.  - [place] J collar in place, ongoing management as per neurosurgery. -Discussed with patient's wife, patient is in severe pain from the fracture, IV Dilaudid  increased, added IV Robaxin , Celebrex , p.o. oxycodone  as needed for breakthrough/moderate pain.  Added PPI for GI prophylaxis. - Follow vitamin D  level   Persistent atrial fibrillation with bradycardia: - Remains in atrial fibrillation, bradycardic -  Xarelto  on hold.  He was given Kcentra  in the ED. -Continue to hold Xarelto , no epidural hematoma however did have edema and blood products interposed between dens and C2. -Noted to have bradycardia, consulted cardiology, he has moderate aortic stenosis  on echo   Acute kidney injury: - Mild with creatinine 1.38 on admission, baseline ~1.0.   - cont gentle hydration. Cr improving  -  Holding ARB.  History of diastolic HF, chronic lower extremity edema -Currently stable, holding torsemide  and metolazone  -Once p.o. intake picks up, will resume diuretics as per wife, he typically develops edema fast on restricted mobility with a fall/illness   History of CVA: - Xarelto  on hold as above.   - Continue atorvastatin .   Hypertension: - hold Candesartan    Mild anemia: - H/H stable  -Follow  anemia panel   Hyperlipidemia: Continue atorvastatin .    Estimated body mass index is 28.28 kg/m as calculated from the following:   Height as of this encounter: 5' 8 (1.727 m).   Weight as of this encounter: 84.4 kg.  Code Status: full  DVT Prophylaxis:  SCDs Start: 11/22/23 2105   Level of Care: Level of care: Progressive Family Communication: Updated patient's wife on the phone  Disposition Plan:      Remains inpatient appropriate:      Procedures:    Consultants:   Neurosurgery   Antimicrobials:   Anti-infectives (From admission, onward)    None          Medications  atorvastatin   5 mg Oral QHS   sodium chloride  flush  3 mL Intravenous Q12H   tamsulosin   0.4 mg Oral QHS      Subjective:   Zachary Underwood was seen and examined today. Uncomfortable, denies dizziness, chest pain, shortness of breath, abdominal pain, N/V. No new FND's. No acute events overnight.    Objective:   Vitals:   11/22/23 2121 11/22/23 2300 11/23/23 0254 11/23/23 0354  BP:   (!) 168/66   Pulse: (!) 44   (!) 49  Resp: (!) 25   16  Temp:  97.6 F (36.4 C) (!) 97.5 F (36.4 C)   TempSrc:  Axillary Oral   SpO2: 98%   93%  Weight:      Height:        Intake/Output Summary (Last 24 hours) at 11/23/2023 0920 Last data filed at 11/22/2023 1222 Gross per 24 hour  Intake 0 ml  Output --  Net 0 ml     Wt Readings from Last 3 Encounters:  11/22/23 84.4 kg  11/06/23 88.7 kg  07/24/23 83.9 kg     Exam General: Alert and oriented x 3, uncomfortable, C-collar on  Cardiovascular: S1 S2 auscultated,  RRR Respiratory: Clear to auscultation bilaterally Gastrointestinal: Soft, nontender, nondistended, + bowel sounds Ext: no pedal edema bilaterally Neuro: Strength 5/5 upper and lower extremities bilaterally Psych: Normal affect     Data Reviewed:  I have personally reviewed following labs    CBC Lab Results  Component Value Date   WBC 7.4 11/23/2023   RBC 3.43 (L)  11/23/2023   HGB 10.5 (L) 11/23/2023   HCT 32.8 (L) 11/23/2023   MCV 95.6 11/23/2023   MCH 30.6 11/23/2023   PLT 163 11/23/2023   MCHC 32.0 11/23/2023   RDW 13.8 11/23/2023   LYMPHSABS 0.7 07/24/2023   MONOABS 0.5 07/24/2023   EOSABS 0.2 07/24/2023   BASOSABS 0.1 07/24/2023     Last metabolic panel Lab Results  Component Value Date   NA 138 11/23/2023   K 4.0 11/23/2023   CL 105 11/23/2023   CO2 22 11/23/2023   BUN 26 (H) 11/23/2023   CREATININE 1.19 11/23/2023   GLUCOSE 111 (H) 11/23/2023   GFRNONAA 57 (  L) 11/23/2023   CALCIUM  9.4 11/23/2023   PROT 6.1 (L) 11/22/2023   ALBUMIN 3.3 (L) 11/22/2023   BILITOT 1.1 11/22/2023   ALKPHOS 107 11/22/2023   AST 31 11/22/2023   ALT 19 11/22/2023   ANIONGAP 11 11/23/2023    CBG (last 3)  No results for input(s): GLUCAP in the last 72 hours.    Coagulation Profile: Recent Labs  Lab 11/22/23 1251  INR 2.0*     Radiology Studies: I have personally reviewed the imaging studies  MR Cervical Spine Wo Contrast Addendum Date: 11/22/2023 EXAM: MRI CERVICAL SPINE WITHOUT CONTRAST 11/22/2023 04:20:45 PM TECHNIQUE: Multiplanar multisequence MRI of the cervical spine was performed without the administration of intravenous contrast. COMPARISON: CT cervical spine earlier same day. CLINICAL HISTORY: Better assess seen fracture on CT as well as the hematoma. Pt given morphine  before exam. Difficult time laying flat, in a lot of pain. Propeller sequences ran as repeats. Best images possible. FINDINGS: BONES AND ALIGNMENT: Displaced type 2 odontoid fracture with significant posterior dislocation of the dens relative to the body of C2, similar to the earlier CT. Edema and possible blood products are interposed between the dens and posterior superior body of C2. Displacement of the fracture site indents the ventral thecal sac at C1-2. Vertebral body heights are otherwise maintained. No suspicious osseous lesion. SPINAL CORD: Flattening of the  ventral cervical cord without evidence of compression. CSF flow artifact along the ventral aspect of the cervical cord at C1-2, compatible with significant stenosis. SOFT TISSUES: Significant prevertebral soft tissue swelling extending from the skull base to the level of C7-T1. Edema within the paraspinal musculature extending from the skull base to the level of C6. C2-C3: Additional disruption of the posterior longitudinal ligament along the posterior aspect of C2. Possible disruption of the ligamentum flavum at C2-3 and C3-4. Edema along the posterior atlantoaxial membrane suggestive of ligamentous injury. C3-C4: Possible disruption of the ligamentum flavum at C3-4. C4-C5: No significant disc herniation. No spinal canal stenosis or neural foraminal narrowing. C5-C6: Disc osteophyte complex which indents the ventral thecal sac, contributing to mild spinal canal stenosis. C6-C7: Disc osteophyte complex which indents the ventral thecal sac. Facet arthrosis. C7-T1: No significant disc herniation. No spinal canal stenosis or neural foraminal narrowing. SPINAL CANAL STENOSIS: Moderate spinal canal stenosis at C1-2, best appreciated on sagittal images with limited evaluation on axial images due to positioning and motion artifact. Additional mild spinal canal stenosis at C5-6. IMPRESSION: 1. Displaced type 2 odontoid fracture with significant posterior dislocation of the dens relative to the body of C2. Associated edema and possible blood products interposed between dens and C2. No definite epidural hematoma. 2. Displacement of dens as well as malalignment of the bilateral atlantoaxial articulations results in moderate spinal canal stenosis and ventral cervical cord flattening. 3. Disruption of the anterior longitudinal ligament and posterior longitudinal ligament at C2. Edema along the posterior atlantoaxial membrane suggestive of ligamentous injury. Possible disruption of the ligamentum flavum at C2-3 and C3-4. 4. C1  fractures better seen on CT. 5. Significant prevertebral edema. 6. Disc osteophyte complexes at C5-6 contributing to mild spinal canal stenosis. 7. Findings discussed with Zachary. Cecilia at 5:22 PM on 11/22/23. Electronically signed by: Donnice Mania MD 11/22/2023 05:37 PM EDT RP Workstation: [id number]   Result Date: 11/22/2023 EXAM: MRI CERVICAL SPINE WITHOUT CONTRAST 11/22/2023 04:20:45 PM TECHNIQUE: Multiplanar multisequence MRI of the cervical spine was performed without the administration of intravenous contrast. COMPARISON: CT cervical spine earlier same day. CLINICAL HISTORY:  Better assess seen fracture on CT as well as the hematoma. Pt given morphine  before exam. Difficult time laying flat, in a lot of pain. Propeller sequences ran as repeats. Best images possible. FINDINGS: BONES AND ALIGNMENT: Displaced type 2 odontoid fracture with significant posterior dislocation of the dens relative to the body of C2, similar to the earlier CT. Edema and possible blood products are interposed between the dens and posterior superior body of C2. Displacement of the fracture site indents the ventral thecal sac at C1-2. Vertebral body heights are otherwise maintained. No suspicious osseous lesion. SPINAL CORD: Flattening of the ventral cervical cord without evidence of compression. CSF flow artifact along the ventral aspect of the cervical cord at C1-2, compatible with significant stenosis. SOFT TISSUES: Significant prevertebral soft tissue swelling extending from the skull base to the level of C7-T1. Edema within the paraspinal musculature extending from the skull base to the level of C6. C2-C3: Additional disruption of the posterior longitudinal ligament along the posterior aspect of C2. Possible disruption of the ligamentum flavum at C2-3 and C3-4. Edema along the posterior atlantoaxial membrane suggestive of ligamentous injury. C3-C4: Possible disruption of the ligamentum flavum at C3-4. C4-C5: No significant disc herniation.  No spinal canal stenosis or neural foraminal narrowing. C5-C6: Disc osteophyte complex which indents the ventral thecal sac, contributing to mild spinal canal stenosis. C6-C7: Disc osteophyte complex which indents the ventral thecal sac. Facet arthrosis. C7-T1: No significant disc herniation. No spinal canal stenosis or neural foraminal narrowing. SPINAL CANAL STENOSIS: Moderate spinal canal stenosis at C1-2, best appreciated on sagittal images with limited evaluation on axial images due to positioning and motion artifact. Additional mild spinal canal stenosis at C5-6. IMPRESSION: 1. Displaced type 2 odontoid fracture with significant posterior dislocation of the dens relative to the body of C2. Associated edema and possible blood products interposed between dens and C2. No definite epidural hematoma. 2. Displacement of dens as well as malalignment of the bilateral atlantoaxial articulations results in moderate spinal canal stenosis and ventral cervical cord flattening. 3. Disruption of the anterior longitudinal ligament and posterior longitudinal ligament at C2. Edema along the posterior atlantoaxial membrane suggestive of ligamentous injury. Possible disruption of the ligamentum flavum at C2-3 and C3-4. 4. C1 fractures better seen on CT. 5. Significant prevertebral edema. 6. Disc osteophyte complexes at C5-6 contributing to mild spinal canal stenosis. Electronically signed by: Donnice Mania MD 11/22/2023 05:12 PM EDT RP Workstation: [id number]   CT CERVICAL SPINE WO CONTRAST Addendum Date: 11/22/2023  EXAM: CT CERVICAL SPINE WITHOUT CONTRAST 11/22/2023 01:07:00 PM TECHNIQUE: CT of the cervical spine was performed without the administration of intravenous contrast. Multiplanar reformatted images are provided for review. Automated exposure control, iterative reconstruction, and/or weight based adjustment of the mA/kV was utilized to reduce the radiation dose to as low as reasonably achievable. COMPARISON: CTA head  and neck dated 08/16/2023. CLINICAL HISTORY: Polytrauma, blunt. Attempting to get in vehicle with caregiver. Mechanical fall while trying to get in vehicle. Hit head. Laceration to head. Complains of pain in the back of neck. On Xarelto . FINDINGS: CERVICAL SPINE: BONES AND ALIGNMENT: There is a displaced fracture through the base of the dens with approximately 12 mm posterior displacement relative to the body of C2. The dens remains articulated with the anterior arch of C1. There are additional mildly displaced fractures through the posterior arch of C1 along the right and left posterolateral aspects. There is likely an additional nondisplaced fracture through the midline anterior arch of C1. Lucency within  the left lateral mass of C2 extending into the left articular pillar favored to reflect a nutrient vessel foramen baseline appearance on coronal images. Cervical lordosis is maintained. Similar anterolisthesis of C5 on C6 likely related to degenerative changes. There is no compression fracture in the cervical spine. DEGENERATIVE CHANGES: Facet arthrosis and uncovertebral hypertrophy at multiple levels in the cervical spine. SOFT TISSUES: Soft tissue surrounding the dens appears increased from prior and may reflect component of hematoma. There is prevertebral soft tissue swelling extending from C1 to C3-4 possible hematoma and fracture displacement results in at least moderate spinal canal stenosis at C1-2. The paraspinal musculature is unremarkable. IMPRESSION: 1. Displaced type II odontoid fracture with approximately 12 mm posterior displacement relative to the body of C2. Associated prevertebral soft tissue swelling and possible epidural hematoma. There is at least moderate spinal canal stenosis at C1-2. Recommend MRI cervical spine for further evaluation. 2. Additional mildly displaced fractures through the posterior arch of C1 and an additional nondisplaced fracture through the midline anterior arch of C1. 3.  Similar anterolisthesis of C5 on C6 likely related to degenerative changes. 4. Findings discussed with Zachary. Simon at 1:50PM on 11/22/23. Electronically signed by: Donnice Mania MD 11/22/2023 01:53 PM EDT RP Workstation: [id number]   Result Date: 11/22/2023  EXAM: CT CERVICAL SPINE WITHOUT CONTRAST 11/22/2023 01:07:00 PM TECHNIQUE: CT of the cervical spine was performed without the administration of intravenous contrast. Multiplanar reformatted images are provided for review. Automated exposure control, iterative reconstruction, and/or weight based adjustment of the mA/kV was utilized to reduce the radiation dose to as low as reasonably achievable. COMPARISON: CTA head and neck dated 08/16/2023. CLINICAL HISTORY: Polytrauma, blunt. Attempting to get in vehicle with caregiver. Mechanical fall while trying to get in vehicle. Hit head. Laceration to head. Complains of pain in the back of neck. On Xarelto . FINDINGS: CERVICAL SPINE: BONES AND ALIGNMENT: There is a displaced fracture through the base of the dens with approximately 12 mm posterior displacement relative to the body of C2. The dens remains articulated with the anterior arch of C1. There are additional mildly displaced fractures through the posterior arch of C1 along the right and left posterolateral aspects. There is likely an additional nondisplaced fracture through the midline anterior arch of C1. Lucency within the left lateral mass of C2 extending into the left articular pillar favored to reflect a nutrient vessel foramen baseline appearance on coronal images. Cervical lordosis is maintained. Similar anterolisthesis of C5 on C6 likely related to degenerative changes. There is no compression fracture in the cervical spine. DEGENERATIVE CHANGES: Facet arthrosis and uncovertebral hypertrophy at multiple levels in the cervical spine. SOFT TISSUES: Soft tissue surrounding the dens appears increased from prior and may reflect component of hematoma. There is  prevertebral soft tissue swelling extending from C1 to C3-4 possible hematoma and fracture displacement results in at least moderate spinal canal stenosis at C1-2. The paraspinal musculature is unremarkable. IMPRESSION: 1. Displaced type II odontoid fracture with approximately 12 mm posterior displacement relative to the body of C2. Associated prevertebral soft tissue swelling and possible epidural hematoma. There is at least moderate spinal canal stenosis at C1-2. Recommend MRI cervical spine for further evaluation. 2. Additional mildly displaced fractures through the posterior arch of C1 and an additional nondisplaced fracture through the midline anterior arch of C1. 3. Similar anterolisthesis of C5 on C6 likely related to degenerative changes. Electronically signed by: Donnice Mania MD 11/22/2023 01:33 PM EDT RP Workstation: [id number]   CT HEAD WO CONTRAST  Result Date: 11/22/2023 CLINICAL DATA:  Provided history: Head trauma, moderate/severe. Facial trauma, blunt. Mechanical fall. Head laceration. Neck pain. On Xarelto . EXAM: CT HEAD WITHOUT CONTRAST CT MAXILLOFACIAL WITHOUT CONTRAST TECHNIQUE: Multidetector CT imaging of the head and maxillofacial structures were performed using the standard protocol without intravenous contrast. Multiplanar CT image reconstructions of the maxillofacial structures were also generated. RADIATION DOSE REDUCTION: This exam was performed according to the departmental dose-optimization program which includes automated exposure control, adjustment of the mA and/or kV according to patient size and/or use of iterative reconstruction technique. COMPARISON:  Brain MRI 07/24/2023. CT angiogram head/neck 07/24/2023. FINDINGS: CT HEAD FINDINGS Brain: Generalized cerebral atrophy. 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 No demarcated cortical infarct. No extra-axial fluid collection. No evidence of an intracranial mass. No  midline shift. Vascular: No hyperdense vessel.  Atherosclerotic calcifications. Skull: No calvarial fracture or aggressive osseous lesion. Other: Frontoparietal scalp laceration and hematoma. CT MAXILLOFACIAL FINDINGS Osseous: No acute maxillofacial fracture is identified. Orbits: No acute orbital finding. Sinuses: No significant paranasal sinus disease. Soft tissues: No maxillofacial hematoma appreciable by CT. Other: Acute cervical spine fractures separately reported on same day cervical spine CT. IMPRESSION: CT head: 1.  No evidence of an acute intracranial abnormality. 2. Frontoparietal scalp laceration and hematoma. 3. Generalized cerebral atrophy. 4. 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 CT maxillofacial: 1. No evidence of an acute maxillofacial fracture. 2. Acute cervical spine fractures separately reported on same day cervical spine CT. Electronically Signed   By: Rockey Childs D.O.   On: 11/22/2023 13:39   CT MAXILLOFACIAL WO CONTRAST Result Date: 11/22/2023 CLINICAL DATA:  Provided history: Head trauma, moderate/severe. Facial trauma, blunt. Mechanical fall. Head laceration. Neck pain. On Xarelto . EXAM: CT HEAD WITHOUT CONTRAST CT MAXILLOFACIAL WITHOUT CONTRAST TECHNIQUE: Multidetector CT imaging of the head and maxillofacial structures were performed using the standard protocol without intravenous contrast. Multiplanar CT image reconstructions of the maxillofacial structures were also generated. RADIATION DOSE REDUCTION: This exam was performed according to the departmental dose-optimization program which includes automated exposure control, adjustment of the mA and/or kV according to patient size and/or use of iterative reconstruction technique. COMPARISON:  Brain MRI 07/24/2023. CT angiogram head/neck 07/24/2023. FINDINGS: CT HEAD FINDINGS Brain: Generalized cerebral atrophy. 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  No demarcated cortical infarct. No extra-axial fluid collection. No evidence of an intracranial mass. No midline shift. Vascular: No hyperdense vessel.  Atherosclerotic calcifications. Skull: No calvarial fracture or aggressive osseous lesion. Other: Frontoparietal scalp laceration and hematoma. CT MAXILLOFACIAL FINDINGS Osseous: No acute maxillofacial fracture is identified. Orbits: No acute orbital finding. Sinuses: No significant paranasal sinus disease. Soft tissues: No maxillofacial hematoma appreciable by CT. Other: Acute cervical spine fractures separately reported on same day cervical spine CT. IMPRESSION: CT head: 1.  No evidence of an acute intracranial abnormality. 2. Frontoparietal scalp laceration and hematoma. 3. Generalized cerebral atrophy. 4. 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 CT maxillofacial: 1. No evidence of an acute maxillofacial fracture. 2. Acute cervical spine fractures separately reported on same day cervical spine CT. Electronically Signed   By: Rockey Childs D.O.   On: 11/22/2023 13:39   CT CHEST ABDOMEN PELVIS W CONTRAST Result Date: 11/22/2023 CLINICAL DATA:  Fall while trying to get into car. EXAM: CT CHEST, ABDOMEN, AND PELVIS WITH CONTRAST TECHNIQUE: Multidetector CT imaging of the chest, abdomen and pelvis was performed following the standard  protocol during bolus administration of intravenous contrast. RADIATION DOSE REDUCTION: This exam was performed according to the departmental dose-optimization program which includes automated exposure control, adjustment of the mA and/or kV according to patient size and/or use of iterative reconstruction technique. CONTRAST:  75mL OMNIPAQUE  IOHEXOL  350 MG/ML SOLN COMPARISON:  April 24, 2022. 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 No dissection is noted. Mild cardiomegaly. No pericardial effusion.  Coronary artery calcifications are noted. Mediastinum/Nodes: No enlarged mediastinal, hilar, or axillary lymph nodes. Thyroid gland, trachea, and esophagus demonstrate no significant findings. Lungs/Pleura: No pneumothorax or pleural effusion is noted. No acute pulmonary disease is noted. Musculoskeletal: 8.5 x 4.7 cm bilobed fat containing lesion is noted in left pectoral region which was present on prior exam, but now 13 mm solid nodule is noted. Lipoma or liposarcoma cannot be excluded. No definite osseous abnormality is noted. CT ABDOMEN PELVIS FINDINGS Hepatobiliary: No focal liver abnormality is seen. No gallstones, gallbladder wall thickening, or biliary dilatation. Pancreas: Unremarkable. No pancreatic ductal dilatation or surrounding inflammatory changes. Spleen: Normal in size without focal abnormality. Adrenals/Urinary Tract: Adrenal glands are unremarkable. Kidneys are normal, without renal calculi, focal lesion, or hydronephrosis. Bladder is unremarkable. Stomach/Bowel: Stomach is unremarkable. No evidence of bowel obstruction or inflammation. Status post appendectomy. Vascular/Lymphatic: Aortic atherosclerosis. No enlarged abdominal or pelvic lymph nodes. Reproductive: Status post prostatic brachytherapy seed placement. Other: No ascites or hernia is noted. Musculoskeletal: Probable old L2 compression fracture. No acute osseous abnormality is noted. 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 Recommend semi-annual imaging followup by CTA or MRA and referral to cardiothoracic surgery if not already obtained. This recommendation follows 2010 ACCF/AHA/AATS/ACR/ASA/SCA/SCAI/SIR/STS/SVM Guidelines for the Diagnosis and Management of Patients With Thoracic Aortic Disease. Circulation. 2010; 121: [id number]. Aortic aneurysm NOS (ICD10-I71.9). 8.5 x 4.7 cm bilobed fatty lesion seen in left pectoral region which was present on prior exam of 2024,  but now noted 13 mm nodule associated with this lesion. This may simply represent lipoma, but liposarcoma cannot be excluded. MRI may be performed for further evaluation. Status post prostatic brachytherapy seed placement. Probable old L2 compression fracture. Coronary artery calcifications are noted suggesting coronary artery disease. Aortic Atherosclerosis (ICD10-I70.0). Electronically Signed   By: Lynwood Landy Raddle M.D.   On: 11/22/2023 13:33   DG Chest Port 1 View Result Date: 11/22/2023 CLINICAL DATA:  Status post fall EXAM: PORTABLE CHEST 1 VIEW COMPARISON:  None Available. FINDINGS: Cardiomediastinal silhouette is enlarged. Aortic knob is calcified. Bilateral vascular congestion and increased interstitial markings of both lung fields. Atherosclerotic calcifications of the aortic annulus is suspected. Query left basilar atelectasis. No pleural effusion or pneumothorax. Right posterior sixth rib healed fracture. No acute fracture identified. Median sternotomy. IMPRESSION: Enlarged cardiomediastinal silhouette and bilateral vascular congestion. Chronic healed fracture of right sixth rib. No new fracture within the limitations of chest radiograph. Electronically Signed   By: Megan  Zare M.D.   On: 11/22/2023 12:58   DG Pelvis Portable Result Date: 11/22/2023 CLINICAL DATA:  Fall. EXAM: PORTABLE PELVIS 1-2 VIEWS COMPARISON:  None Available. FINDINGS: There is no evidence of pelvic fracture or diastasis. No pelvic bone lesions are seen. IMPRESSION: Negative. Electronically Signed   By: Lynwood Landy Raddle M.D.   On: 11/22/2023 12:40       Simranjit Thayer M.D. [facility] 11/23/2023, 9:20 AM  Available via Epic secure chat 7am-7pm After  7 pm, please refer to night coverage provider listed on amion.

## 2023-11-23 NOTE — Progress Notes (Addendum)
    Providing Compassionate, Quality Care - Together   NEUROSURGERY PROGRESS NOTE     S: No issues overnight.    O: EXAM:  BP (!) 168/66 (BP Location: Right Arm)   Pulse (!) 49   Temp (!) 97.5 F (36.4 C) (Oral)   Resp 16   Ht 5' 8 (1.727 m)   Wt 84.4 kg   SpO2 93%   BMI 28.28 kg/m     Awake, alert, oriented  Speech fluent, appropriate  BUE/BLE 5/5 SILTx4 [place] J in place   ASSESSMENT:  89 y.o. with displaced type 2 odontoid fracture     PLAN: -Continue [place] J at all times -Continue to hold Xarelto  -Therapies as tolerated -Call w/ questions/concerns.   Camie Pickle, [facility]

## 2023-11-24 DIAGNOSIS — S0101XA Laceration without foreign body of scalp, initial encounter: Secondary | ICD-10-CM | POA: Diagnosis not present

## 2023-11-24 DIAGNOSIS — W19XXXA Unspecified fall, initial encounter: Secondary | ICD-10-CM | POA: Diagnosis not present

## 2023-11-24 DIAGNOSIS — I482 Chronic atrial fibrillation, unspecified: Secondary | ICD-10-CM | POA: Diagnosis not present

## 2023-11-24 DIAGNOSIS — I5032 Chronic diastolic (congestive) heart failure: Secondary | ICD-10-CM

## 2023-11-24 DIAGNOSIS — R001 Bradycardia, unspecified: Secondary | ICD-10-CM

## 2023-11-24 DIAGNOSIS — S12100A Unspecified displaced fracture of second cervical vertebra, initial encounter for closed fracture: Secondary | ICD-10-CM | POA: Diagnosis not present

## 2023-11-24 DIAGNOSIS — I10 Essential (primary) hypertension: Secondary | ICD-10-CM

## 2023-11-24 DIAGNOSIS — I350 Nonrheumatic aortic (valve) stenosis: Secondary | ICD-10-CM

## 2023-11-24 LAB — URINALYSIS, ROUTINE W REFLEX MICROSCOPIC
Bacteria, UA: NONE SEEN
Bilirubin Urine: NEGATIVE
Glucose, UA: NEGATIVE mg/dL
Hgb urine dipstick: NEGATIVE
Ketones, ur: NEGATIVE mg/dL
Nitrite: NEGATIVE
Protein, ur: NEGATIVE mg/dL
Specific Gravity, Urine: 1.03 (ref 1.005–1.030)
pH: 5 (ref 5.0–8.0)

## 2023-11-24 LAB — CBC
HCT: 32.6 % — ABNORMAL LOW (ref 39.0–52.0)
Hemoglobin: 10.6 g/dL — ABNORMAL LOW (ref 13.0–17.0)
MCH: 30.8 pg (ref 26.0–34.0)
MCHC: 32.5 g/dL (ref 30.0–36.0)
MCV: 94.8 fL (ref 80.0–100.0)
Platelets: 166 K/uL (ref 150–400)
RBC: 3.44 MIL/uL — ABNORMAL LOW (ref 4.22–5.81)
RDW: 13.8 % (ref 11.5–15.5)
WBC: 11.2 K/uL — ABNORMAL HIGH (ref 4.0–10.5)
nRBC: 0 % (ref 0.0–0.2)

## 2023-11-24 LAB — RENAL FUNCTION PANEL
Albumin: 2.9 g/dL — ABNORMAL LOW (ref 3.5–5.0)
Anion gap: 10 (ref 5–15)
BUN: 29 mg/dL — ABNORMAL HIGH (ref 8–23)
CO2: 22 mmol/L (ref 22–32)
Calcium: 9.3 mg/dL (ref 8.9–10.3)
Chloride: 106 mmol/L (ref 98–111)
Creatinine, Ser: 1.22 mg/dL (ref 0.61–1.24)
GFR, Estimated: 55 mL/min — ABNORMAL LOW (ref >60)
Glucose, Bld: 113 mg/dL — ABNORMAL HIGH (ref 70–99)
Phosphorus: 3.5 mg/dL (ref 2.5–4.6)
Potassium: 3.7 mmol/L (ref 3.5–5.1)
Sodium: 138 mmol/L (ref 135–145)

## 2023-11-24 MED ORDER — PANTOPRAZOLE SODIUM 40 MG PO TBEC
40.0000 mg | DELAYED_RELEASE_TABLET | Freq: Every day | ORAL | Status: DC
  Administered 2023-11-24 – 2023-11-26 (×3): 40 mg via ORAL
  Filled 2023-11-24 (×3): qty 1

## 2023-11-24 MED ORDER — LORATADINE 10 MG PO TABS
10.0000 mg | ORAL_TABLET | Freq: Every day | ORAL | Status: DC
  Administered 2023-11-24 – 2023-11-28 (×5): 10 mg via ORAL
  Filled 2023-11-24 (×5): qty 1

## 2023-11-24 MED ORDER — METOLAZONE 2.5 MG PO TABS
2.5000 mg | ORAL_TABLET | Freq: Every day | ORAL | Status: DC
  Administered 2023-11-24 – 2023-11-28 (×5): 2.5 mg via ORAL
  Filled 2023-11-24 (×5): qty 1

## 2023-11-24 NOTE — Progress Notes (Addendum)
 [facility]                                                                              Zachary Underwood, is a 89 y.o. male, DOB - Mar 11, 1928, FMW:[id number] Admit date - 11/22/2023    Outpatient Primary MD for the patient is [name]  LOS - 2  days  Chief Complaint  Patient presents with   Fall       Brief summary   Patient is a 89 y.o. male with persistent atrial fibrillation on Xarelto , history of CVA, moderate aortic stenosis, s/p surgical repair of ascending thoracic aorta, HTN, HLD, BPH, chronic lower extremity edema presented to the ED for evaluation after he fell and hit his head and neck. Patient was trying to get into his car earlier on the day of admission when he slipped and fell.  He hit his head and neck,did not lose consciousness.  He was having pain at the back of his neck, EMS was called. Patient is functional at baseline and continues to teach as a professor.  CT cervical spine without contrast IMPRESSION: 1. Displaced type II odontoid fracture with approximately 12 mm posterior displacement relative to the body of C2. Associated prevertebral soft tissue swelling and possible epidural hematoma. There is at least moderate spinal canal stenosis at C1-2. Recommend MRI cervical spine for further evaluation. 2. Additional mildly displaced fractures through the posterior arch of C1 and an additional nondisplaced fracture through the midline anterior arch of C1. 3. Similar anterolisthesis of C5 on C6 likely related to degenerative changes.  MRI cervical spine without contrast IMPRESSION: 1. Displaced type 2 odontoid fracture with significant posterior dislocation of the dens relative to the body of C2. Associated edema and possible blood products interposed between dens and C2. No definite epidural hematoma. 2. Displacement of dens as well as malalignment of the bilateral atlantoaxial articulations results in moderate spinal canal stenosis and  ventral cervical cord flattening. 3. Disruption of the anterior longitudinal ligament and posterior longitudinal ligament at C2. Edema along the posterior atlantoaxial membrane suggestive of ligamentous injury. Possible disruption of the ligamentum flavum at C2-3 and C3-4. 4. C1 fractures better seen on CT. 5. Significant prevertebral edema. 6. Disc osteophyte complexes at C5-6 contributing to mild spinal canal stenosis.  Assessment & Plan    Type II posterior displaced odontoid fracture occurring after mechanical fall C1 fracture: - Neurosurgery consulted, evaluated by Dr Lanis, recommended non-operative management at this time.    - Holding Xarelto . Recommended Cervical collar, PT, rehab.  - [place] J collar in place, ongoing management as per neurosurgery. - Pain better controlled today, for now continue IV Dilaudid , IV Robaxin , p.o. oxycodone  as needed.  Also placed on Celebrex  per wife's request however if creatinine worsens, will have to hold NSAIDs.   - Continue PPI - Vitamin D  level stable 36.8   Persistent atrial fibrillation with bradycardia: - Remains in atrial fibrillation, bradycardic -  Xarelto  on hold.  He was given Kcentra  in the ED. -Continue to hold Xarelto , no epidural hematoma however did have edema and blood products interposed between dens and C2. - Appreciate cardiology recommendations  Acute kidney injury: - Mild with creatinine 1.38 on admission, baseline ~1.0.   - Creatinine improving, resumed Avapro  (candesartan not on formulary)  History of diastolic HF, chronic lower extremity edema -Resumed metolazone  2.5 mg daily.  Reviewed cardiology recommendations however patient's wife requested metolazone  and not Demadex . - Continue to follow renal function    History of CVA: - Xarelto  on hold as above.   - Continue atorvastatin .   Hypertension: - Continue Avapro , metolazone    Mild anemia: - H/H stable  - Anemia panel showed anemia of chronic disease  however iron stores still lower FE 30, percent saturation 10, normal ferritin 216   Hyperlipidemia: Continue atorvastatin .    Estimated body mass index is 28.28 kg/m as calculated from the following:   Height as of this encounter: 5' 8 (1.727 m).   Weight as of this encounter: 84.4 kg.  Code Status: full  DVT Prophylaxis:  SCDs Start: 11/22/23 2105   Level of Care: Level of care: Progressive Family Communication: Updated patient's wife on the phone today, answered all questions and concerns Disposition Plan:      Remains inpatient appropriate:      Procedures:    Consultants:   Neurosurgery   Antimicrobials:   Anti-infectives (From admission, onward)    None          Medications  atorvastatin   5 mg Oral QHS   celecoxib   200 mg Oral BID   feeding supplement  237 mL Oral BID BM   fluticasone   2 spray Each Nare Daily   irbesartan   37.5 mg Oral Daily   methocarbamol  (ROBAXIN ) injection  500 mg Intravenous Q8H   metolazone   2.5 mg Oral Daily   pantoprazole  (PROTONIX ) IV  40 mg Intravenous Q24H   sodium chloride  flush  3 mL Intravenous Q12H   tamsulosin   0.4 mg Oral QHS      Subjective:   Zachary Underwood was seen and examined today.  Appears much better today, speech clear, pain is controlled.  Able to tolerate soft diet and p.o. meds.  No acute events overnight.  Objective:   Vitals:   11/24/23 0303 11/24/23 0314 11/24/23 0822 11/24/23 0900  BP: 137/65 137/65 (!) 141/68   Pulse: (!) 54 (!) 58 70 61  Resp: 20 19 (!) 22 (!) 22  Temp: 98 F (36.7 C) 97.9 F (36.6 C) 97.6 F (36.4 C)   TempSrc: Oral Oral Oral   SpO2: 94% 94% 93% 96%  Weight:      Height:        Intake/Output Summary (Last 24 hours) at 11/24/2023 1035 Last data filed at 11/23/2023 1941 Gross per 24 hour  Intake 120 ml  Output 600 ml  Net -480 ml     Wt Readings from Last 3 Encounters:  11/22/23 84.4 kg  11/06/23 88.7 kg  07/24/23 83.9 kg    Physical Exam General: Alert and  oriented x 3, NAD, c-collar on Cardiovascular: Irregularly irregular Respiratory: CTAB, no wheezing, Gastrointestinal: Soft, nontender, nondistended, NBS Ext: trace pedal edema bilaterally Neuro: no new deficits Psych: Normal affect      Data Reviewed:  I have personally reviewed following labs    CBC Lab Results  Component Value Date   WBC 11.2 (H) 11/24/2023   RBC 3.44 (L) 11/24/2023   HGB 10.6 (L) 11/24/2023   HCT 32.6 (L) 11/24/2023   MCV 94.8 11/24/2023   MCH 30.8 11/24/2023   PLT 166 11/24/2023   MCHC 32.5 11/24/2023   RDW 13.8  11/24/2023   LYMPHSABS 0.7 07/24/2023   MONOABS 0.5 07/24/2023   EOSABS 0.2 07/24/2023   BASOSABS 0.1 07/24/2023     Last metabolic panel Lab Results  Component Value Date   NA 138 11/24/2023   K 3.7 11/24/2023   CL 106 11/24/2023   CO2 22 11/24/2023   BUN 29 (H) 11/24/2023   CREATININE 1.22 11/24/2023   GLUCOSE 113 (H) 11/24/2023   GFRNONAA 55 (L) 11/24/2023   CALCIUM  9.3 11/24/2023   PHOS 3.5 11/24/2023   PROT 6.1 (L) 11/22/2023   ALBUMIN 2.9 (L) 11/24/2023   BILITOT 1.1 11/22/2023   ALKPHOS 107 11/22/2023   AST 31 11/22/2023   ALT 19 11/22/2023   ANIONGAP 10 11/24/2023    CBG (last 3)  No results for input(s): GLUCAP in the last 72 hours.    Coagulation Profile: Recent Labs  Lab 11/22/23 1251  INR 2.0*     Radiology Studies: I have personally reviewed the imaging studies  MR Cervical Spine Wo Contrast Addendum Date: 11/22/2023 EXAM: MRI CERVICAL SPINE WITHOUT CONTRAST 11/22/2023 04:20:45 PM TECHNIQUE: Multiplanar multisequence MRI of the cervical spine was performed without the administration of intravenous contrast. COMPARISON: CT cervical spine earlier same day. CLINICAL HISTORY: Better assess seen fracture on CT as well as the hematoma. Pt given morphine  before exam. Difficult time laying flat, in a lot of pain. Propeller sequences ran as repeats. Best images possible. FINDINGS: BONES AND ALIGNMENT: Displaced  type 2 odontoid fracture with significant posterior dislocation of the dens relative to the body of C2, similar to the earlier CT. Edema and possible blood products are interposed between the dens and posterior superior body of C2. Displacement of the fracture site indents the ventral thecal sac at C1-2. Vertebral body heights are otherwise maintained. No suspicious osseous lesion. SPINAL CORD: Flattening of the ventral cervical cord without evidence of compression. CSF flow artifact along the ventral aspect of the cervical cord at C1-2, compatible with significant stenosis. SOFT TISSUES: Significant prevertebral soft tissue swelling extending from the skull base to the level of C7-T1. Edema within the paraspinal musculature extending from the skull base to the level of C6. C2-C3: Additional disruption of the posterior longitudinal ligament along the posterior aspect of C2. Possible disruption of the ligamentum flavum at C2-3 and C3-4. Edema along the posterior atlantoaxial membrane suggestive of ligamentous injury. C3-C4: Possible disruption of the ligamentum flavum at C3-4. C4-C5: No significant disc herniation. No spinal canal stenosis or neural foraminal narrowing. C5-C6: Disc osteophyte complex which indents the ventral thecal sac, contributing to mild spinal canal stenosis. C6-C7: Disc osteophyte complex which indents the ventral thecal sac. Facet arthrosis. C7-T1: No significant disc herniation. No spinal canal stenosis or neural foraminal narrowing. SPINAL CANAL STENOSIS: Moderate spinal canal stenosis at C1-2, best appreciated on sagittal images with limited evaluation on axial images due to positioning and motion artifact. Additional mild spinal canal stenosis at C5-6. IMPRESSION: 1. Displaced type 2 odontoid fracture with significant posterior dislocation of the dens relative to the body of C2. Associated edema and possible blood products interposed between dens and C2. No definite epidural hematoma. 2.  Displacement of dens as well as malalignment of the bilateral atlantoaxial articulations results in moderate spinal canal stenosis and ventral cervical cord flattening. 3. Disruption of the anterior longitudinal ligament and posterior longitudinal ligament at C2. Edema along the posterior atlantoaxial membrane suggestive of ligamentous injury. Possible disruption of the ligamentum flavum at C2-3 and C3-4. 4. C1 fractures better seen on  CT. 5. Significant prevertebral edema. 6. Disc osteophyte complexes at C5-6 contributing to mild spinal canal stenosis. 7. Findings discussed with Dr. Cecilia at 5:22 PM on 11/22/23. Electronically signed by: Donnice Mania MD 11/22/2023 05:37 PM EDT RP Workstation: [id number]   Result Date: 11/22/2023 EXAM: MRI CERVICAL SPINE WITHOUT CONTRAST 11/22/2023 04:20:45 PM TECHNIQUE: Multiplanar multisequence MRI of the cervical spine was performed without the administration of intravenous contrast. COMPARISON: CT cervical spine earlier same day. CLINICAL HISTORY: Better assess seen fracture on CT as well as the hematoma. Pt given morphine  before exam. Difficult time laying flat, in a lot of pain. Propeller sequences ran as repeats. Best images possible. FINDINGS: BONES AND ALIGNMENT: Displaced type 2 odontoid fracture with significant posterior dislocation of the dens relative to the body of C2, similar to the earlier CT. Edema and possible blood products are interposed between the dens and posterior superior body of C2. Displacement of the fracture site indents the ventral thecal sac at C1-2. Vertebral body heights are otherwise maintained. No suspicious osseous lesion. SPINAL CORD: Flattening of the ventral cervical cord without evidence of compression. CSF flow artifact along the ventral aspect of the cervical cord at C1-2, compatible with significant stenosis. SOFT TISSUES: Significant prevertebral soft tissue swelling extending from the skull base to the level of C7-T1. Edema within the  paraspinal musculature extending from the skull base to the level of C6. C2-C3: Additional disruption of the posterior longitudinal ligament along the posterior aspect of C2. Possible disruption of the ligamentum flavum at C2-3 and C3-4. Edema along the posterior atlantoaxial membrane suggestive of ligamentous injury. C3-C4: Possible disruption of the ligamentum flavum at C3-4. C4-C5: No significant disc herniation. No spinal canal stenosis or neural foraminal narrowing. C5-C6: Disc osteophyte complex which indents the ventral thecal sac, contributing to mild spinal canal stenosis. C6-C7: Disc osteophyte complex which indents the ventral thecal sac. Facet arthrosis. C7-T1: No significant disc herniation. No spinal canal stenosis or neural foraminal narrowing. SPINAL CANAL STENOSIS: Moderate spinal canal stenosis at C1-2, best appreciated on sagittal images with limited evaluation on axial images due to positioning and motion artifact. Additional mild spinal canal stenosis at C5-6. IMPRESSION: 1. Displaced type 2 odontoid fracture with significant posterior dislocation of the dens relative to the body of C2. Associated edema and possible blood products interposed between dens and C2. No definite epidural hematoma. 2. Displacement of dens as well as malalignment of the bilateral atlantoaxial articulations results in moderate spinal canal stenosis and ventral cervical cord flattening. 3. Disruption of the anterior longitudinal ligament and posterior longitudinal ligament at C2. Edema along the posterior atlantoaxial membrane suggestive of ligamentous injury. Possible disruption of the ligamentum flavum at C2-3 and C3-4. 4. C1 fractures better seen on CT. 5. Significant prevertebral edema. 6. Disc osteophyte complexes at C5-6 contributing to mild spinal canal stenosis. Electronically signed by: Donnice Mania MD 11/22/2023 05:12 PM EDT RP Workstation: [id number]   CT CERVICAL SPINE WO CONTRAST Addendum Date: 11/22/2023   EXAM: CT CERVICAL SPINE WITHOUT CONTRAST 11/22/2023 01:07:00 PM TECHNIQUE: CT of the cervical spine was performed without the administration of intravenous contrast. Multiplanar reformatted images are provided for review. Automated exposure control, iterative reconstruction, and/or weight based adjustment of the mA/kV was utilized to reduce the radiation dose to as low as reasonably achievable. COMPARISON: CTA head and neck dated 08/16/2023. CLINICAL HISTORY: Polytrauma, blunt. Attempting to get in vehicle with caregiver. Mechanical fall while trying to get in vehicle. Hit head. Laceration to head. Complains of pain in  the back of neck. On Xarelto . FINDINGS: CERVICAL SPINE: BONES AND ALIGNMENT: There is a displaced fracture through the base of the dens with approximately 12 mm posterior displacement relative to the body of C2. The dens remains articulated with the anterior arch of C1. There are additional mildly displaced fractures through the posterior arch of C1 along the right and left posterolateral aspects. There is likely an additional nondisplaced fracture through the midline anterior arch of C1. Lucency within the left lateral mass of C2 extending into the left articular pillar favored to reflect a nutrient vessel foramen baseline appearance on coronal images. Cervical lordosis is maintained. Similar anterolisthesis of C5 on C6 likely related to degenerative changes. There is no compression fracture in the cervical spine. DEGENERATIVE CHANGES: Facet arthrosis and uncovertebral hypertrophy at multiple levels in the cervical spine. SOFT TISSUES: Soft tissue surrounding the dens appears increased from prior and may reflect component of hematoma. There is prevertebral soft tissue swelling extending from C1 to C3-4 possible hematoma and fracture displacement results in at least moderate spinal canal stenosis at C1-2. The paraspinal musculature is unremarkable. IMPRESSION: 1. Displaced type II odontoid fracture  with approximately 12 mm posterior displacement relative to the body of C2. Associated prevertebral soft tissue swelling and possible epidural hematoma. There is at least moderate spinal canal stenosis at C1-2. Recommend MRI cervical spine for further evaluation. 2. Additional mildly displaced fractures through the posterior arch of C1 and an additional nondisplaced fracture through the midline anterior arch of C1. 3. Similar anterolisthesis of C5 on C6 likely related to degenerative changes. 4. Findings discussed with Dr. Simon at 1:50PM on 11/22/23. Electronically signed by: Donnice Mania MD 11/22/2023 01:53 PM EDT RP Workstation: [id number]   Result Date: 11/22/2023  EXAM: CT CERVICAL SPINE WITHOUT CONTRAST 11/22/2023 01:07:00 PM TECHNIQUE: CT of the cervical spine was performed without the administration of intravenous contrast. Multiplanar reformatted images are provided for review. Automated exposure control, iterative reconstruction, and/or weight based adjustment of the mA/kV was utilized to reduce the radiation dose to as low as reasonably achievable. COMPARISON: CTA head and neck dated 08/16/2023. CLINICAL HISTORY: Polytrauma, blunt. Attempting to get in vehicle with caregiver. Mechanical fall while trying to get in vehicle. Hit head. Laceration to head. Complains of pain in the back of neck. On Xarelto . FINDINGS: CERVICAL SPINE: BONES AND ALIGNMENT: There is a displaced fracture through the base of the dens with approximately 12 mm posterior displacement relative to the body of C2. The dens remains articulated with the anterior arch of C1. There are additional mildly displaced fractures through the posterior arch of C1 along the right and left posterolateral aspects. There is likely an additional nondisplaced fracture through the midline anterior arch of C1. Lucency within the left lateral mass of C2 extending into the left articular pillar favored to reflect a nutrient vessel foramen baseline appearance on  coronal images. Cervical lordosis is maintained. Similar anterolisthesis of C5 on C6 likely related to degenerative changes. There is no compression fracture in the cervical spine. DEGENERATIVE CHANGES: Facet arthrosis and uncovertebral hypertrophy at multiple levels in the cervical spine. SOFT TISSUES: Soft tissue surrounding the dens appears increased from prior and may reflect component of hematoma. There is prevertebral soft tissue swelling extending from C1 to C3-4 possible hematoma and fracture displacement results in at least moderate spinal canal stenosis at C1-2. The paraspinal musculature is unremarkable. IMPRESSION: 1. Displaced type II odontoid fracture with approximately 12 mm posterior displacement relative to the body of C2.  Associated prevertebral soft tissue swelling and possible epidural hematoma. There is at least moderate spinal canal stenosis at C1-2. Recommend MRI cervical spine for further evaluation. 2. Additional mildly displaced fractures through the posterior arch of C1 and an additional nondisplaced fracture through the midline anterior arch of C1. 3. Similar anterolisthesis of C5 on C6 likely related to degenerative changes. Electronically signed by: Donnice Mania MD 11/22/2023 01:33 PM EDT RP Workstation: [id number]   CT HEAD WO CONTRAST Result Date: 11/22/2023 CLINICAL DATA:  Provided history: Head trauma, moderate/severe. Facial trauma, blunt. Mechanical fall. Head laceration. Neck pain. On Xarelto . EXAM: CT HEAD WITHOUT CONTRAST CT MAXILLOFACIAL WITHOUT CONTRAST TECHNIQUE: Multidetector CT imaging of the head and maxillofacial structures were performed using the standard protocol without intravenous contrast. Multiplanar CT image reconstructions of the maxillofacial structures were also generated. RADIATION DOSE REDUCTION: This exam was performed according to the departmental dose-optimization program which includes automated exposure control, adjustment of the mA and/or kV  according to patient size and/or use of iterative reconstruction technique. COMPARISON:  Brain MRI 07/24/2023. CT angiogram head/neck 07/24/2023. FINDINGS: CT HEAD FINDINGS Brain: Generalized cerebral atrophy. 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 No demarcated cortical infarct. No extra-axial fluid collection. No evidence of an intracranial mass. No midline shift. Vascular: No hyperdense vessel.  Atherosclerotic calcifications. Skull: No calvarial fracture or aggressive osseous lesion. Other: Frontoparietal scalp laceration and hematoma. CT MAXILLOFACIAL FINDINGS Osseous: No acute maxillofacial fracture is identified. Orbits: No acute orbital finding. Sinuses: No significant paranasal sinus disease. Soft tissues: No maxillofacial hematoma appreciable by CT. Other: Acute cervical spine fractures separately reported on same day cervical spine CT. IMPRESSION: CT head: 1.  No evidence of an acute intracranial abnormality. 2. Frontoparietal scalp laceration and hematoma. 3. Generalized cerebral atrophy. 4. 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 CT maxillofacial: 1. No evidence of an acute maxillofacial fracture. 2. Acute cervical spine fractures separately reported on same day cervical spine CT. Electronically Signed   By: Rockey Childs D.O.   On: 11/22/2023 13:39   CT MAXILLOFACIAL WO CONTRAST Result Date: 11/22/2023 CLINICAL DATA:  Provided history: Head trauma, moderate/severe. Facial trauma, blunt. Mechanical fall. Head laceration. Neck pain. On Xarelto . EXAM: CT HEAD WITHOUT CONTRAST CT MAXILLOFACIAL WITHOUT CONTRAST TECHNIQUE: Multidetector CT imaging of the head and maxillofacial structures were performed using the standard protocol without intravenous contrast. Multiplanar CT image reconstructions of the maxillofacial structures were also generated. RADIATION DOSE REDUCTION: This exam was performed according to the  departmental dose-optimization program which includes automated exposure control, adjustment of the mA and/or kV according to patient size and/or use of iterative reconstruction technique. COMPARISON:  Brain MRI 07/24/2023. CT angiogram head/neck 07/24/2023. FINDINGS: CT HEAD FINDINGS Brain: Generalized cerebral atrophy. 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 No demarcated cortical infarct. No extra-axial fluid collection. No evidence of an intracranial mass. No midline shift. Vascular: No hyperdense vessel.  Atherosclerotic calcifications. Skull: No calvarial fracture or aggressive osseous lesion. Other: Frontoparietal scalp laceration and hematoma. CT MAXILLOFACIAL FINDINGS Osseous: No acute maxillofacial fracture is identified. Orbits: No acute orbital finding. Sinuses: No significant paranasal sinus disease. Soft tissues: No maxillofacial hematoma appreciable by CT. Other: Acute cervical spine fractures separately reported on same day cervical spine CT. IMPRESSION: CT head: 1.  No evidence of an acute intracranial abnormality. 2. Frontoparietal scalp laceration and hematoma. 3. Generalized cerebral atrophy. 4. 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 CT  maxillofacial: 1. No evidence of an acute maxillofacial fracture. 2. Acute cervical spine fractures separately reported on same day cervical spine CT. Electronically Signed   By: Rockey Childs D.O.   On: 11/22/2023 13:39   CT CHEST ABDOMEN PELVIS W CONTRAST Result Date: 11/22/2023 CLINICAL DATA:  Fall while trying to get into car. EXAM: CT CHEST, ABDOMEN, AND PELVIS WITH CONTRAST TECHNIQUE: Multidetector CT imaging of the chest, abdomen and pelvis was performed following the standard protocol during bolus administration of intravenous contrast. RADIATION DOSE REDUCTION: This exam was performed according to the departmental dose-optimization program which includes automated  exposure control, adjustment of the mA and/or kV according to patient size and/or use of iterative reconstruction technique. CONTRAST:  75mL OMNIPAQUE  IOHEXOL  350 MG/ML SOLN COMPARISON:  April 24, 2022. 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 No dissection is noted. Mild cardiomegaly. No pericardial effusion. Coronary artery calcifications are noted. Mediastinum/Nodes: No enlarged mediastinal, hilar, or axillary lymph nodes. Thyroid gland, trachea, and esophagus demonstrate no significant findings. Lungs/Pleura: No pneumothorax or pleural effusion is noted. No acute pulmonary disease is noted. Musculoskeletal: 8.5 x 4.7 cm bilobed fat containing lesion is noted in left pectoral region which was present on prior exam, but now 13 mm solid nodule is noted. Lipoma or liposarcoma cannot be excluded. No definite osseous abnormality is noted. CT ABDOMEN PELVIS FINDINGS Hepatobiliary: No focal liver abnormality is seen. No gallstones, gallbladder wall thickening, or biliary dilatation. Pancreas: Unremarkable. No pancreatic ductal dilatation or surrounding inflammatory changes. Spleen: Normal in size without focal abnormality. Adrenals/Urinary Tract: Adrenal glands are unremarkable. Kidneys are normal, without renal calculi, focal lesion, or hydronephrosis. Bladder is unremarkable. Stomach/Bowel: Stomach is unremarkable. No evidence of bowel obstruction or inflammation. Status post appendectomy. Vascular/Lymphatic: Aortic atherosclerosis. No enlarged abdominal or pelvic lymph nodes. Reproductive: Status post prostatic brachytherapy seed placement. Other: No ascites or hernia is noted. Musculoskeletal: Probable old L2 compression fracture. No acute osseous abnormality is noted. 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 Recommend semi-annual  imaging followup by CTA or MRA and referral to cardiothoracic surgery if not already obtained. This recommendation follows 2010 ACCF/AHA/AATS/ACR/ASA/SCA/SCAI/SIR/STS/SVM Guidelines for the Diagnosis and Management of Patients With Thoracic Aortic Disease. Circulation. 2010; 121: Z733-z630. Aortic aneurysm NOS (ICD10-I71.9). 8.5 x 4.7 cm bilobed fatty lesion seen in left pectoral region which was present on prior exam of 2024, but now noted 13 mm nodule associated with this lesion. This may simply represent lipoma, but liposarcoma cannot be excluded. MRI may be performed for further evaluation. Status post prostatic brachytherapy seed placement. Probable old L2 compression fracture. Coronary artery calcifications are noted suggesting coronary artery disease. Aortic Atherosclerosis (ICD10-I70.0). Electronically Signed   By: Lynwood Landy Raddle M.D.   On: 11/22/2023 13:33   DG Chest Port 1 View Result Date: 11/22/2023 CLINICAL DATA:  Status post fall EXAM: PORTABLE CHEST 1 VIEW COMPARISON:  None Available. FINDINGS: Cardiomediastinal silhouette is enlarged. Aortic knob is calcified. Bilateral vascular congestion and increased interstitial markings of both lung fields. Atherosclerotic calcifications of the aortic annulus is suspected. Query left basilar atelectasis. No pleural effusion or pneumothorax. Right posterior sixth rib healed fracture. No acute fracture identified. Median sternotomy. IMPRESSION: Enlarged cardiomediastinal silhouette and bilateral vascular congestion. Chronic healed fracture of right sixth rib. No new fracture within the limitations of chest radiograph. Electronically Signed   By: Megan  Zare M.D.  On: 11/22/2023 12:58   DG Pelvis Portable Result Date: 11/22/2023 CLINICAL DATA:  Fall. EXAM: PORTABLE PELVIS 1-2 VIEWS COMPARISON:  None Available. FINDINGS: There is no evidence of pelvic fracture or diastasis. No pelvic bone lesions are seen. IMPRESSION: Negative. Electronically Signed   By:  Lynwood Landy Raddle M.D.   On: 11/22/2023 12:40       Shulem Mader M.D. [facility] 11/24/2023, 10:35 AM  Available via Epic secure chat 7am-7pm After 7 pm, please refer to night coverage provider listed on amion.

## 2023-11-24 NOTE — Progress Notes (Addendum)
 Progress Note  Patient Name: Zachary Underwood Date of Encounter: 11/24/2023  [facility] Cardiologist: Dorn Lesches, MD   Patient Profile     Subjective   Up in the chair this am.  Denies any CP or SOB.   Inpatient Medications    Scheduled Meds:  atorvastatin   5 mg Oral QHS   celecoxib   200 mg Oral BID   feeding supplement  237 mL Oral BID BM   fluticasone   2 spray Each Nare Daily   irbesartan   37.5 mg Oral Daily   methocarbamol  (ROBAXIN ) injection  500 mg Intravenous Q8H   metolazone   2.5 mg Oral Daily   pantoprazole  (PROTONIX ) IV  40 mg Intravenous Q24H   sodium chloride  flush  3 mL Intravenous Q12H   tamsulosin   0.4 mg Oral QHS   Continuous Infusions:  PRN Meds: hydrALAZINE , HYDROmorphone  (DILAUDID ) injection, ondansetron  **OR** ondansetron  (ZOFRAN ) IV, oxyCODONE , senna-docusate, sodium chloride    Vital Signs    Vitals:   11/24/23 0303 11/24/23 0314 11/24/23 0822 11/24/23 0900  BP: 137/65 137/65 (!) 141/68   Pulse: (!) 54 (!) 58 70 61  Resp: 20 19 (!) 22 (!) 22  Temp: 98 F (36.7 C) 97.9 F (36.6 C) 97.6 F (36.4 C)   TempSrc: Oral Oral Oral   SpO2: 94% 94% 93% 96%  Weight:      Height:        Intake/Output Summary (Last 24 hours) at 11/24/2023 0909 Last data filed at 11/23/2023 1941 Gross per 24 hour  Intake 120 ml  Output 600 ml  Net -480 ml      11/22/2023   12:20 PM 11/06/2023    2:46 PM 07/24/2023    9:28 PM  Last 3 Weights  Weight (lbs) 186 lb 195 lb 9.6 oz 185 lb  Weight (kg) 84.369 kg 88.724 kg 83.915 kg      Telemetry    Atrial Fibrillation with CVR - Personally Reviewed  ECG    No new EKG to review - Personally Reviewed  Physical Exam   GEN: No acute distress.   Neck:neck collar present. Multiple excoriations on his head Cardiac: irregularly irregular, no murmurs, rubs, or gallops.  Respiratory: Clear to auscultation bilaterally. GI: Soft, nontender, non-distended  MS: No edema; No deformity. Neuro:  Nonfocal  Psych:  Normal affect   Labs    High Sensitivity Troponin:  No results for input(s): TROPONINIHS in the last 720 hours.    Chemistry Recent Labs  Lab 11/22/23 1221 11/22/23 1241 11/23/23 0553 11/24/23 0451  NA 138 139 138 138  K 3.9 3.9 4.0 3.7  CL 107 105 105 106  CO2 24  --  22 22  GLUCOSE 137* 137* 111* 113*  BUN 27* 27* 26* 29*  CREATININE 1.38* 1.40* 1.19 1.22  CALCIUM  9.4  --  9.4 9.3  PROT 6.1*  --   --   --   ALBUMIN 3.3*  --   --  2.9*  AST 31  --   --   --   ALT 19  --   --   --   ALKPHOS 107  --   --   --   BILITOT 1.1  --   --   --   GFRNONAA 47*  --  57* 55*  ANIONGAP 7  --  11 10     Hematology Recent Labs  Lab 11/22/23 1221 11/22/23 1241 11/23/23 0553 11/23/23 1408 11/24/23 0451  WBC 7.0  --  7.4  --  11.2*  RBC 3.47*  --  3.43* 3.58* 3.44*  HGB 10.6* 10.2* 10.5*  --  10.6*  HCT 33.4* 30.0* 32.8*  --  32.6*  MCV 96.3  --  95.6  --  94.8  MCH 30.5  --  30.6  --  30.8  MCHC 31.7  --  32.0  --  32.5  RDW 13.9  --  13.8  --  13.8  PLT 170  --  163  --  166    BNPNo results for input(s): BNP, PROBNP in the last 168 hours.   DDimer No results for input(s): DDIMER in the last 168 hours.   CHA2DS2-VASc Score = 6   This indicates a 9.7% annual risk of stroke. The patient's score is based upon: CHF History: 0 HTN History: 1 Diabetes History: 0 Stroke History: 2 Vascular Disease History: 1 Age Score: 2 Gender Score: 0      Radiology      Patient Profile     89 y.o. 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   Assessment & Plan    #Bradycardia #Chronic atrial fibrillation - Patient has history of a chronic atrial fibrillation and had pauses up to 4 seconds during sleep by a heart monitor last fall 2024 at which time permanent pacemaker was not recommended - Now having  significant bradycardia during sleep and while awake - 2D echo 07/25/2023 with EF greater than 75% with moderate LVH, severe biatrial enlargement, moderate MAC and at least moderate aortic valve stenosis - He has not been on any rate slowing medications at home. - Patient states that he has not had any problems with dizziness, presyncope or syncope except for 1 episode about 10 days ago that was very short-lived.  He has had multiple falls and none of them have been precipitated by dizziness or presyncope or syncope. - He really is asymptomatic with his bradycardia.  His wife was insistent that every time he has fallen and had an injury that he gets bradycardic but then once he has recovered from his injuries his heart rate goes back up to the normal range - Tele reviewed today and demonstrates atrial fibrillation with HR in the 50's currently but as low as low 30's at times including during sleep - Xarelto  currently on hold due to C1 fracture and displaced odontoid fracture and received Kcentra  - Do not think there is any indication for temporary pacing at this time as he remains asymptomatic.  Will ask EP to evaluate since his HR does get into the 30's while awake at times   #Type II posterior displaced odontoid fracture occurring after mechanical fall with C1 fracture - Neurosurgery following currently recommending nonoperative management - Xarelto  on hold - [place] J collar in place   #Chronic diastolic CHF #Chronic lower extremity edema -He appears euvolemic on exam today -He was taking Demadex  and metolazone  as an outpatient -Found to be in AKI with a a serum creatinine of 1.4 on admission but serum creatinine is down to 1.1 today. -recommended restarting Torsemide  yesterday and holding on Metolazone  but wife was adamant about given Metolazone  daily so now getting Metolazone  2.5mg  daily with no demadex  -SCr today 1.22 (slightly up from 1.19 yesterday) -continue to follow renal function    #Hypertension - Candesartan initially on hold due to bump in SCr -  started Irbesartan  37.5mg  daily yesterday - BP 141/40mmHg this am - SCr stable at 1.2 this am - No beta-blocker due to bradycardia   #Coronary artery calcifications - Noted on chest CT  -2D echo 07/25/2023 with EF greater than 75% with moderate LVH - Denies anginal symptoms - Continue Lipitor 5 mg daily   #Ruptured aortic aneurysm/dissection  - status post repair in [place] - CT of the chest showed a 4.7 cm aneurysmal dilatation of the ascending order with no dissection - This can be followed outpatient - Needs good control hypertension  #Aortic Stenosis -moderate by echo 07/2022 but was not complete study at that time -Repeat echo pending  I spent 35 minutes caring for this patient today face to face, ordering and reviewing labs, reviewing records from 2D echo from 4/25 and 12/24 , seeing the patient, documenting in the record, and arranging for a 2D echo     For questions or updates, please contact [facility] Please consult [email or web address] for contact info under        Signed, Wilbert Bihari, MD  11/24/2023, 9:09 AM

## 2023-11-24 NOTE — Evaluation (Signed)
 Clinical/Bedside Swallow Evaluation Patient Details  Name: Zachary Underwood MRN: [id number] Date of Birth: 13-Apr-1928  Today's Date: 11/24/2023 Time: SLP Start Time (ACUTE ONLY): 1145 SLP Stop Time (ACUTE ONLY): 1200 SLP Time Calculation (min) (ACUTE ONLY): 15 min  Past Medical History:  Past Medical History:  Diagnosis Date   Arthritis    Cardiac arrhythmia    Hypertension    Ruptured aortic aneurysm (HCC)    Stroke (HCC) 08/23/2021   Past Surgical History:  Past Surgical History:  Procedure Laterality Date   open heart surgery     ruptured aortic aneurysm repair     HPI:  Pt is a 89 y.o. male who presented to the ED for evaluation after he fell and hit his head and neck. CT head and maxillofacial negative for acute changes. CT cervical spine: mildly displaced fractures through the posterior arch of C1 and nondisplaced fracture through the midline anterior arch of C1. MR cervical spine: Displaced type 2 odontoid fracture with significant posterior dislocation of the dens relative to the body of C2, osteophyte at C5-6. PMH: persistent atrial fibrillation on Xarelto , CVA, moderate aortic stenosis, s/p surgical repair of ascending thoracic aorta, HTN, HLD, BPH, chronic lower extremity edema.    Assessment / Plan / Recommendation  Clinical Impression  Pt was seen for bedside swallow evaluation. He was alert and cooperative, but his reliability as a historian is questioned due to the inconsistency/contradictory nature of his responses to questions. He reported that he has been coughing with p.o. intake, but the onset of this or further related details were unclear. Oral mechanism exam was [facility] though mandibular ROM was restricted by the cervical collar. Adequate, natural dentition was noted. He presented with throat clearing and/or coughing with thin liquids, nectar thick liquids, and puree and pharyngeal residue is questioned due to multiple swallows. A dysphagia 3 diet with thin liquids is  recommended with observance of swallowing precautions. A modified barium swallow study is recommended to further assess the structural and functional integrity of the swallow mechanism; date of completion is pending Radiology's schedule. SLP Visit Diagnosis: Dysphagia, unspecified (R13.10)    Aspiration Risk  Mild aspiration risk    Diet Recommendation Dysphagia 3 (Mech soft);Thin liquid    Liquid Administration via: Cup;Straw Medication Administration: Whole meds with liquid Supervision: Staff to assist with self feeding Compensations: Slow rate;Small sips/bites;Follow solids with liquid Postural Changes: Seated upright at 90 degrees    Other  Recommendations Oral Care Recommendations: Oral care BID     Assistance Recommended at Discharge    Functional Status Assessment Patient has had a recent decline in their functional status and demonstrates the ability to make significant improvements in function in a reasonable and predictable amount of time.  Frequency and Duration min 2x/week  2 weeks       Prognosis Prognosis for improved oropharyngeal function: Good Barriers to Reach Goals: Cognitive deficits      Swallow Study   General Date of Onset: 11/23/23 HPI: Pt is a 89 y.o. male who presented to the ED for evaluation after he fell and hit his head and neck. CT head and maxillofacial negative for acute changes. CT cervical spine: mildly displaced fractures through the posterior arch of C1 and nondisplaced fracture through the midline anterior arch of C1. MR cervical spine: Displaced type 2 odontoid fracture with significant posterior dislocation of the dens relative to the body of C2, osteophyte at C5-6. PMH: persistent atrial fibrillation on Xarelto , CVA, moderate aortic stenosis, s/p surgical  repair of ascending thoracic aorta, HTN, HLD, BPH, chronic lower extremity edema. Type of Study: Bedside Swallow Evaluation Previous Swallow Assessment: none Diet Prior to this Study:  Dysphagia 3 (mechanical soft);Thin liquids (Level 0) Temperature Spikes Noted: No Respiratory Status: Room air History of Recent Intubation: No Behavior/Cognition: Alert;Cooperative Oral Cavity Assessment: Within Functional Limits Oral Care Completed by SLP: No Oral Cavity - Dentition: Adequate natural dentition Vision: Functional for self-feeding Self-Feeding Abilities: Able to feed self Patient Positioning: Upright in bed;Postural control adequate for testing Baseline Vocal Quality: Normal Volitional Cough: Weak Volitional Swallow: Able to elicit    Oral/Motor/Sensory Function Overall Oral Motor/Sensory Function: Within functional limits   Ice Chips Ice chips: Impaired Presentation: Spoon Pharyngeal Phase Impairments: Cough - Immediate;Cough - Delayed   Thin Liquid Thin Liquid: Impaired Presentation: Cup;Straw Pharyngeal  Phase Impairments: Cough - Immediate;Cough - Delayed;Throat Clearing - Immediate    Nectar Thick Nectar Thick Liquid: Impaired Presentation: Straw Pharyngeal Phase Impairments: Cough - Immediate;Cough - Delayed   Honey Thick Honey Thick Liquid: Not tested   Puree Puree: Impaired Pharyngeal Phase Impairments: Throat Clearing - Immediate;Cough - Delayed;Multiple swallows   Solid     Solid: Within functional limits     Marimar Suber I. [place], MS, CCC-SLP Acute Rehabilitation Services Office number [phone]  [name] [place] 11/24/2023,12:43 PM

## 2023-11-24 NOTE — Progress Notes (Signed)
 Patient developed episodes of acute confusion. Pt started getting out of bed without help, he stated that he was home and he needed to get up. Patients was redirected and reassured that he was in hospital and safe. Reached out to on call provider to get an order for urinalysis. Order placed, waiting on patient to void. Will continue to monitor.

## 2023-11-24 NOTE — Progress Notes (Signed)
 Transition of Care [facility]) - CAGE-AID Screening   Patient Details  Name: Zachary Underwood MRN: [id number] Date of Birth: [birth year]   [name], RN Trauma Response Nurse Phone Number: [phone] 11/24/2023, 10:11 AM      CAGE-AID Screening:    Have You Ever Felt You Ought to Cut Down on Your Drinking or Drug Use?: No Have People Annoyed You By Critizing Your Drinking Or Drug Use?: No Have You Felt Bad Or Guilty About Your Drinking Or Drug Use?: No Have You Ever Had a Drink or Used Drugs First Thing In The Morning to Steady Your Nerves or to Get Rid of a Hangover?: No CAGE-AID Score: 0  Substance Abuse Education Offered: (S) No (no services needed, pt denies alcohol use)

## 2023-11-24 NOTE — Evaluation (Signed)
 Physical Therapy Evaluation Patient Details Name: Zachary Underwood MRN: [id number] DOB: 11-Jan-1928 Today's Date: 11/24/2023  History of Present Illness  Pt is a 89 y.o. male presenting 8/1 with head/neck pain after a fall at home. MRI C spine with displaced type 2 odontoid fracture with significant posterior dislocation of the dens relative to the body of C2. PMH P A-fib, CVA, moderate aortic stenosis, s/p surgical repair of ascending thoracic aorta, HTN, HLD, BPH, chronic lower extremity edema.    Clinical Impression  Pt in bed upon arrival of PT, agreeable to evaluation at this time. Prior to admission the pt was mobilizing with use of RW or rollator in the home, performing daily exercises with supervision from aide, and completing ADLs with assist. The pt now presents with limitations in functional mobility, strength, stability, and activity tolerance due to above dx, and will continue to benefit from skilled PT to address these deficits. He required the most assist to complete log roll to maintain cervical precautions, but once sitting EOB required minA to complete sit-stand transfer and hallway ambulation with use of RW. Pt with mild instability needing minA to steady as well as cues for maintaining straight path for ambulation and to increase stride length and clearance as pt with progressive shuffling of feet with fatigue during gait. Recommend continued skilled PT acutely to progress functional independence with transfers, stability, and gait, but anticipate he will be able to return home with family and aide support and HHPT once medically stable.       If plan is discharge home, recommend the following: A little help with walking and/or transfers;A little help with bathing/dressing/bathroom;Assistance with cooking/housework;Assist for transportation;Help with stairs or ramp for entrance   Can travel by private vehicle        Equipment Recommendations None recommended by PT   Recommendations for Other Services       Functional Status Assessment Patient has had a recent decline in their functional status and demonstrates the ability to make significant improvements in function in a reasonable and predictable amount of time.     Precautions / Restrictions Precautions Precautions: Fall;Cervical Precaution Booklet Issued: Yes (comment) Required Braces or Orthoses: Cervical Brace Cervical Brace: Hard collar;At all times Restrictions Weight Bearing Restrictions Per Provider Order: No      Mobility  Bed Mobility Overal bed mobility: Needs Assistance Bed Mobility: Rolling, Sidelying to Sit Rolling: Mod assist, Used rails Sidelying to sit: Mod assist, +2 for physical assistance       General bed mobility comments: modA with max cues and +2 assist to manage cervical precautions with mobility. pt yelling out in pain    Transfers Overall transfer level: Needs assistance Equipment used: Rolling walker (2 wheels) Transfers: Sit to/from Stand Sit to Stand: Min assist           General transfer comment: minA to rise, cues for hand positioning    Ambulation/Gait Ambulation/Gait assistance: Contact guard assist, Min assist (chair follor) Gait Distance (Feet): 150 Feet Assistive device: Rolling walker (2 wheels) Gait Pattern/deviations: Step-through pattern, Decreased stride length, Decreased dorsiflexion - right, Decreased dorsiflexion - left, Shuffle Gait velocity: decreased Gait velocity interpretation: <1.31 ft/sec, indicative of household ambulator   General Gait Details: progressive shuffling of feet with fatigue, no overt buckling but increased drifting to R and slowed speed. minA for safety and cues for positioning in RW     Balance Overall balance assessment: Needs assistance Sitting-balance support: No upper extremity supported, Feet supported Sitting balance-Leahy Scale:  Good     Standing balance support: Bilateral upper extremity  supported, During functional activity Standing balance-Leahy Scale: Fair                               Pertinent Vitals/Pain Pain Assessment Pain Assessment: Faces Faces Pain Scale: Hurts even more Pain Location: neck with bed mobility Pain Descriptors / Indicators: Discomfort, Grimacing Pain Intervention(s): Limited activity within patient's tolerance, Monitored during session, Repositioned    Home Living Family/patient expects to be discharged to:: Private residence Living Arrangements: Spouse/significant other Available Help at Discharge: Family;Available 24 hours/day Type of Home: House Home Access: Stairs to enter Entrance Stairs-Rails: Left;Right;Can reach both Entrance Stairs-Number of Steps: 3 steps Alternate Level Stairs-Number of Steps: chair lift to get upstairs to bedroom Home Layout: Bed/bath upstairs;Multi-level Home Equipment: Agricultural consultant (2 wheels);Cane - quad;Shower seat;BSC/3in1;Rollator (4 wheels) Additional Comments: aide drives pt to errands, exercises    Prior Function Prior Level of Function : Needs assist             Mobility Comments: ambulation with RW upstairs and rollator downstairs ADLs Comments: aide assists with LB dressing, driving, and entertainment during the day.     Extremity/Trunk Assessment   Upper Extremity Assessment Upper Extremity Assessment: Defer to OT evaluation    Lower Extremity Assessment Lower Extremity Assessment: Overall WFL for tasks assessed    Cervical / Trunk Assessment Cervical / Trunk Assessment: Kyphotic;Other exceptions Cervical / Trunk Exceptions: cervical fx in collar, educated on no cervical ROM  Communication   Communication Communication: Impaired Factors Affecting Communication: Hearing impaired    Cognition Arousal: Alert, Lethargic (asleep initially, improved with mobility) Behavior During Therapy: WFL for tasks assessed/performed   PT - Cognitive impairments: No  family/caregiver present to determine baseline, Awareness                       PT - Cognition Comments: pt able to answer questions WFL, tangential but likely pt's baseline Following commands: Intact       Cueing Cueing Techniques: Verbal cues     General Comments General comments (skin integrity, edema, etc.): VSS on 3L at rest upon arrival, tolerated with VSS for ambulation on 3L    Exercises     Assessment/Plan    PT Assessment Patient needs continued PT services  PT Problem List Decreased strength;Decreased range of motion;Decreased activity tolerance;Decreased balance;Decreased mobility;Decreased cognition;Decreased safety awareness       PT Treatment Interventions DME instruction;Gait training;Stair training;Functional mobility training;Therapeutic activities;Therapeutic exercise;Balance training;Patient/family education    PT Goals (Current goals can be found in the Care Plan section)  Acute Rehab PT Goals Patient Stated Goal: to return home, reduce pain PT Goal Formulation: With patient Time For Goal Achievement: 12/08/23 Potential to Achieve Goals: Good    Frequency Min 3X/week        AM-PAC PT 6 Clicks Mobility  Outcome Measure Help needed turning from your back to your side while in a flat bed without using bedrails?: A Lot Help needed moving from lying on your back to sitting on the side of a flat bed without using bedrails?: A Lot Help needed moving to and from a bed to a chair (including a wheelchair)?: A Little Help needed standing up from a chair using your arms (e.g., wheelchair or bedside chair)?: A Little Help needed to walk in hospital room?: A Little Help needed climbing 3-5 steps with a railing? :  A Lot 6 Click Score: 15    End of Session Equipment Utilized During Treatment: Gait belt;Cervical collar Activity Tolerance: Patient tolerated treatment well Patient left: in chair;with call bell/phone within reach;with chair alarm  set Nurse Communication: Mobility status PT Visit Diagnosis: Unsteadiness on feet (R26.81);Other abnormalities of gait and mobility (R26.89);Repeated falls (R29.6);Muscle weakness (generalized) (M62.81);Difficulty in walking, not elsewhere classified (R26.2)    Time: 8743-8666 PT Time Calculation (min) (ACUTE ONLY): 37 min   Charges:   PT Evaluation $PT Eval Moderate Complexity: 1 Mod   PT General Charges $$ ACUTE PT VISIT: 1 Visit         Izetta Call, PT, DPT   Acute Rehabilitation Department Office [phone] Secure Chat Communication Preferred  Izetta JULIANNA Call 11/24/2023, 2:14 PM

## 2023-11-24 NOTE — Evaluation (Signed)
 Occupational Therapy Evaluation Patient Details Name: Zachary Underwood MRN: [id number] DOB: 1928-03-07 Today's Date: 11/24/2023   History of Present Illness   Pt is a 89 y.o. male presenting 8/1 with head/neck pain after a fall at home. MRI C spine with displaced type 2 odontoid fracture with significant posterior dislocation of the dens relative to the body of C2. PMH P A-fib, CVA, moderate aortic stenosis, s/p surgical repair of ascending thoracic aorta, HTN, HLD, BPH, chronic lower extremity edema     Clinical Impressions PTA, pt lived with wife and reports being independent in ADL; pt with aide who accompanies him to errands, provides transportation, and assists with HEP for maintaining strength/cognition. Upon eval, pt with pain, decr functional mobility, safety, knowledge of precautions and decreased cardiopulmonary status. Pt needing up to min A for UB ADL and mod A for LB ADL within precautions. Pt reports aide can assists at home as needed. Recommending discharge home with HHOT.      If plan is discharge home, recommend the following:   A little help with walking and/or transfers;A little help with bathing/dressing/bathroom;Assistance with cooking/housework;Assist for transportation;Help with stairs or ramp for entrance     Functional Status Assessment   Patient has had a recent decline in their functional status and demonstrates the ability to make significant improvements in function in a reasonable and predictable amount of time.     Equipment Recommendations   None recommended by OT     Recommendations for Other Services   Speech consult     Precautions/Restrictions   Precautions Precautions: Fall;Cervical Precaution Booklet Issued: Yes (comment) Recall of Precautions/Restrictions: Impaired Precaution/Restrictions Comments: precautions reviewed within the context of ADL/mobility; will benefit from further/cont education Required Braces or Orthoses: Cervical  Brace Cervical Brace: Hard collar;At all times Restrictions Weight Bearing Restrictions Per Provider Order: No     Mobility Bed Mobility Overal bed mobility: Needs Assistance Bed Mobility: Rolling, Sidelying to Sit Rolling: Mod assist, Used rails Sidelying to sit: Mod assist, +2 for physical assistance       General bed mobility comments: modA with max cues and +2 assist to manage cervical precautions with mobility. pt yelling out in pain    Transfers Overall transfer level: Needs assistance Equipment used: Rolling walker (2 wheels) Transfers: Sit to/from Stand Sit to Stand: Min assist           General transfer comment: minA to rise, cues for hand positioning      Balance Overall balance assessment: Needs assistance Sitting-balance support: No upper extremity supported, Feet supported Sitting balance-Leahy Scale: Good     Standing balance support: Bilateral upper extremity supported, During functional activity Standing balance-Leahy Scale: Fair                             ADL either performed or assessed with clinical judgement   ADL Overall ADL's : Needs assistance/impaired Eating/Feeding: Set up;Sitting   Grooming: Minimal assistance;Standing   Upper Body Bathing: Minimal assistance;Sitting   Lower Body Bathing: Moderate assistance;Sit to/from stand   Upper Body Dressing : Minimal assistance;Sitting   Lower Body Dressing: Moderate assistance;Sit to/from stand   Toilet Transfer: Contact guard assist;Ambulation;Rolling walker (2 wheels)           Functional mobility during ADLs: Contact guard assist;Rolling walker (2 wheels);Cueing for safety       Vision Baseline Vision/History: 0 No visual deficits Ability to See in Adequate Light: 0 Adequate Patient Visual  Report: No change from baseline Vision Assessment?: No apparent visual deficits Additional Comments: hx of cataract surgery, pt reports vision is deteriorated but good for his  age     Perception Perception: Not tested       Praxis Praxis: Not tested       Pertinent Vitals/Pain Pain Assessment Pain Assessment: Faces Faces Pain Scale: Hurts even more Pain Location: neck with bed mobility Pain Descriptors / Indicators: Discomfort, Grimacing Pain Intervention(s): Limited activity within patient's tolerance, Monitored during session     Extremity/Trunk Assessment Upper Extremity Assessment Upper Extremity Assessment: Generalized weakness (Pt reports R hand/arm numbness right after fall but that this has resolved)   Lower Extremity Assessment Lower Extremity Assessment: Defer to PT evaluation   Cervical / Trunk Assessment Cervical / Trunk Assessment: Kyphotic;Other exceptions Cervical / Trunk Exceptions: cervical fx in collar, educated on no cervical ROM   Communication Communication Communication: Impaired Factors Affecting Communication: Hearing impaired   Cognition Arousal: Alert, Lethargic (asleep initially, improved with mobility) Behavior During Therapy: WFL for tasks assessed/performed Cognition: No family/caregiver present to determine baseline             OT - Cognition Comments: answers questions WFL, intermittently lethargic, but improving once upright. pt tangential, but likely baseline. Some mild decr insight into deficits                 Following commands: Intact       Cueing  General Comments   Cueing Techniques: Verbal cues  VSS on 3L at rest upon arrival, tolerated with VSS for ambulation on 3L   Exercises     Shoulder Instructions      Home Living Family/patient expects to be discharged to:: Private residence Living Arrangements: Spouse/significant other Available Help at Discharge: Family;Available 24 hours/day Type of Home: House Home Access: Stairs to enter [employer] of Steps: 3 steps Entrance Stairs-Rails: Left;Right;Can reach both Home Layout: Bed/bath upstairs;Multi-level Alternate  Level Stairs-Number of Steps: chair lift to get upstairs to bedroom   Bathroom Shower/Tub: Chief Strategy Officer: Standard     Home Equipment: Agricultural consultant (2 wheels);Cane - quad;Shower seat;BSC/3in1;Rollator (4 wheels)   Additional Comments: aide drives pt to errands, exercises      Prior Functioning/Environment Prior Level of Function : Needs assist             Mobility Comments: ambulation with RW upstairs and rollator downstairs ADLs Comments: aide assists with LB dressing, driving, and entertainment during the day.    OT Problem List: Decreased strength;Decreased activity tolerance;Impaired balance (sitting and/or standing);Decreased safety awareness;Decreased knowledge of use of DME or AE;Pain   OT Treatment/Interventions: Self-care/ADL training;Therapeutic exercise;DME and/or AE instruction;Therapeutic activities;Patient/family education;Balance training      OT Goals(Current goals can be found in the care plan section)   Acute Rehab OT Goals Patient Stated Goal: get better OT Goal Formulation: With patient Time For Goal Achievement: 12/08/23 Potential to Achieve Goals: Good   OT Frequency:  Min 2X/week    Co-evaluation              AM-PAC OT 6 Clicks Daily Activity     Outcome Measure Help from another person eating meals?: None Help from another person taking care of personal grooming?: A Little Help from another person toileting, which includes using toliet, bedpan, or urinal?: A Little Help from another person bathing (including washing, rinsing, drying)?: A Lot Help from another person to put on and taking off regular upper body clothing?:  A Little Help from another person to put on and taking off regular lower body clothing?: A Lot 6 Click Score: 17   End of Session Equipment Utilized During Treatment: Gait belt;Rolling walker (2 wheels);Cervical collar Nurse Communication: Mobility status  Activity Tolerance: Patient tolerated  treatment well Patient left: with call bell/phone within reach;in chair;with chair alarm set  OT Visit Diagnosis: Unsteadiness on feet (R26.81);Muscle weakness (generalized) (M62.81);Pain                Time: 8742-8667 OT Time Calculation (min): 35 min Charges:  OT General Charges $OT Visit: 1 Visit OT Evaluation $OT Eval Moderate Complexity: 1 Mod  Elma JONETTA Lebron FREDERICK, OTR/L [facility] Acute Rehabilitation Office: [phone]   Elma JONETTA Lebron 11/24/2023, 2:37 PM

## 2023-11-24 NOTE — Progress Notes (Signed)
  NEUROSURGERY PROGRESS NOTE   No issues overnight. Pt sitting in bedside chair eating breakfast. Reports nasal congestion. Significantly improved neck pain.  EXAM:  BP (!) 141/68 (BP Location: Right Arm)   Pulse 61   Temp 97.6 F (36.4 C) (Oral)   Resp (!) 22   Ht 5' 8 (1.727 m)   Wt 84.4 kg   SpO2 96%   BMI 28.28 kg/m   Awake, alert, oriented  Speech fluent, appropriate  CN grossly intact  5/5 BUE/BLE   IMPRESSION:  89 y.o. male s/p fall with displaced type 2 odontoid fracture. Remains neurologically intact with improved neck pain. Will plan on non-operative management.  PLAN: - Cont PT/OT - Can see him in outpatient NS clinic in 2 weeks for clinical and X-ray f/u.   Gerldine Maizes, MD [facility]

## 2023-11-24 NOTE — Plan of Care (Signed)
  Problem: Activity: Goal: Risk for activity intolerance will decrease Outcome: Progressing   Problem: Coping: Goal: Level of anxiety will decrease Outcome: Progressing   Problem: Pain Managment: Goal: General experience of comfort will improve and/or be controlled Outcome: Progressing   Problem: Safety: Goal: Ability to remain free from injury will improve Outcome: Progressing

## 2023-11-25 ENCOUNTER — Inpatient Hospital Stay (HOSPITAL_COMMUNITY)

## 2023-11-25 DIAGNOSIS — I350 Nonrheumatic aortic (valve) stenosis: Secondary | ICD-10-CM

## 2023-11-25 DIAGNOSIS — I482 Chronic atrial fibrillation, unspecified: Secondary | ICD-10-CM | POA: Diagnosis not present

## 2023-11-25 DIAGNOSIS — S12100A Unspecified displaced fracture of second cervical vertebra, initial encounter for closed fracture: Secondary | ICD-10-CM | POA: Diagnosis not present

## 2023-11-25 DIAGNOSIS — S0101XA Laceration without foreign body of scalp, initial encounter: Secondary | ICD-10-CM | POA: Diagnosis not present

## 2023-11-25 DIAGNOSIS — W19XXXA Unspecified fall, initial encounter: Secondary | ICD-10-CM | POA: Diagnosis not present

## 2023-11-25 DIAGNOSIS — I5032 Chronic diastolic (congestive) heart failure: Secondary | ICD-10-CM | POA: Diagnosis not present

## 2023-11-25 LAB — ECHOCARDIOGRAM COMPLETE
AR max vel: 1.28 cm2
AV Area VTI: 1.63 cm2
AV Area mean vel: 1.5 cm2
AV Mean grad: 22 mmHg
AV Peak grad: 44.1 mmHg
Ao pk vel: 3.32 m/s
Height: 68 in
MV VTI: 2.07 cm2
S' Lateral: 1.8 cm
Weight: 2976 [oz_av]

## 2023-11-25 LAB — BRAIN NATRIURETIC PEPTIDE: B Natriuretic Peptide: 261 pg/mL — ABNORMAL HIGH (ref 0.0–100.0)

## 2023-11-25 LAB — CBC
HCT: 32.7 % — ABNORMAL LOW (ref 39.0–52.0)
Hemoglobin: 10.4 g/dL — ABNORMAL LOW (ref 13.0–17.0)
MCH: 30.4 pg (ref 26.0–34.0)
MCHC: 31.8 g/dL (ref 30.0–36.0)
MCV: 95.6 fL (ref 80.0–100.0)
Platelets: 145 K/uL — ABNORMAL LOW (ref 150–400)
RBC: 3.42 MIL/uL — ABNORMAL LOW (ref 4.22–5.81)
RDW: 13.9 % (ref 11.5–15.5)
WBC: 6.8 K/uL (ref 4.0–10.5)
nRBC: 0 % (ref 0.0–0.2)

## 2023-11-25 LAB — RENAL FUNCTION PANEL
Albumin: 2.8 g/dL — ABNORMAL LOW (ref 3.5–5.0)
Anion gap: 8 (ref 5–15)
BUN: 31 mg/dL — ABNORMAL HIGH (ref 8–23)
CO2: 24 mmol/L (ref 22–32)
Calcium: 9.3 mg/dL (ref 8.9–10.3)
Chloride: 107 mmol/L (ref 98–111)
Creatinine, Ser: 1.21 mg/dL (ref 0.61–1.24)
GFR, Estimated: 55 mL/min — ABNORMAL LOW (ref >60)
Glucose, Bld: 95 mg/dL (ref 70–99)
Phosphorus: 3.2 mg/dL (ref 2.5–4.6)
Potassium: 3.5 mmol/L (ref 3.5–5.1)
Sodium: 139 mmol/L (ref 135–145)

## 2023-11-25 MED ORDER — SODIUM CHLORIDE 0.9 % IV SOLN
3.0000 g | Freq: Four times a day (QID) | INTRAVENOUS | Status: DC
  Administered 2023-11-25 – 2023-11-28 (×12): 3 g via INTRAVENOUS
  Filled 2023-11-25 (×11): qty 8

## 2023-11-25 NOTE — Progress Notes (Signed)
  NEUROSURGERY PROGRESS NOTE   No issues overnight. Pt sitting in bedside chair. Some neck pain today.  EXAM:  BP (!) 152/68 (BP Location: Right Arm)   Pulse 71   Temp 98.5 F (36.9 C) (Oral)   Resp 16   Ht 5' 8 (1.727 m)   Wt 84.4 kg   SpO2 95%   BMI 28.28 kg/m   Awake, alert, oriented  Speech fluent CN grossly intact  5/5 BUE/BLE   IMPRESSION:  89 y.o. male s/p fall with displaced type 2 odontoid fracture. Remains neurologically intact with improved neck pain. Will plan on non-operative management as I continue to believe that the chances of poor outcome with surgical stabilization in this 89yo patient including worsening neurologic condition or failure to thrive after surgery outweighs any benefit that may be gained in regards to decreased potential for neurologic worsening from fracture displacement when compared with hard collar immobilization.  PLAN: - Cont PT/OT - Can see him in outpatient NS clinic in 2 weeks for clinical and X-ray f/u.   Gerldine Maizes, MD [facility]

## 2023-11-25 NOTE — Progress Notes (Signed)
  Echocardiogram 2D Echocardiogram has been performed.  Zachary Underwood 11/25/2023, 4:04 PM

## 2023-11-25 NOTE — Progress Notes (Addendum)
 Progress Note  Patient Name: Zachary Underwood Date of Encounter: 11/25/2023  [facility] Cardiologist: Dorn Lesches, MD    Subjective   No chest pain overnight. Telemetry reviewed, HR variable from upper 30's to 90's in afib.   Inpatient Medications    Scheduled Meds:  atorvastatin   5 mg Oral QHS   celecoxib   200 mg Oral BID   feeding supplement  237 mL Oral BID BM   fluticasone   2 spray Each Nare Daily   irbesartan   37.5 mg Oral Daily   loratadine   10 mg Oral Daily   methocarbamol  (ROBAXIN ) injection  500 mg Intravenous Q8H   metolazone   2.5 mg Oral Daily   pantoprazole   40 mg Oral Daily   sodium chloride  flush  3 mL Intravenous Q12H   tamsulosin   0.4 mg Oral QHS   Continuous Infusions:  PRN Meds: hydrALAZINE , HYDROmorphone  (DILAUDID ) injection, ondansetron  **OR** ondansetron  (ZOFRAN ) IV, oxyCODONE , senna-docusate, sodium chloride    Vital Signs    Vitals:   11/24/23 2005 11/24/23 2314 11/25/23 0305 11/25/23 0734  BP: (!) 156/80 (!) 119/90 (!) 150/75 132/89  Pulse: 64 63 70 61  Resp: 19 13 16 12   Temp: 97.7 F (36.5 C) 98.5 F (36.9 C) 97.8 F (36.6 C) (!) 97.5 F (36.4 C)  TempSrc: Oral Oral Oral Oral  SpO2: 100% 95% 97% 100%  Weight:      Height:        Intake/Output Summary (Last 24 hours) at 11/25/2023 0937 Last data filed at 11/25/2023 0530 Gross per 24 hour  Intake 243 ml  Output 650 ml  Net -407 ml      11/22/2023   12:20 PM 11/06/2023    2:46 PM 07/24/2023    9:28 PM  Last 3 Weights  Weight (lbs) 186 lb 195 lb 9.6 oz 185 lb  Weight (kg) 84.369 kg 88.724 kg 83.915 kg      Telemetry    Atrial Fibrillation with CVR - Personally Reviewed  ECG    N/A  Physical Exam   GEN: No acute distress.   Neck:neck collar present. Multiple excoriations on his head Cardiac: irregularly irregular, 3/6 SEM at RUSB, no rubs or gallops.  Respiratory: Clear to auscultation bilaterally. GI: Soft, nontender, non-distended  MS: No edema; No  deformity. Neuro:  Nonfocal  Psych: Normal affect   Labs    High Sensitivity Troponin:  No results for input(s): TROPONINIHS in the last 720 hours.    Chemistry Recent Labs  Lab 11/22/23 1221 11/22/23 1241 11/23/23 0553 11/24/23 0451 11/25/23 0749  NA 138   < > 138 138 139  K 3.9   < > 4.0 3.7 3.5  CL 107   < > 105 106 107  CO2 24  --  22 22 24   GLUCOSE 137*   < > 111* 113* 95  BUN 27*   < > 26* 29* 31*  CREATININE 1.38*   < > 1.19 1.22 1.21  CALCIUM  9.4  --  9.4 9.3 9.3  PROT 6.1*  --   --   --   --   ALBUMIN 3.3*  --   --  2.9* 2.8*  AST 31  --   --   --   --   ALT 19  --   --   --   --   ALKPHOS 107  --   --   --   --   BILITOT 1.1  --   --   --   --  GFRNONAA 47*  --  57* 55* 55*  ANIONGAP 7  --  11 10 8    < > = values in this interval not displayed.     Hematology Recent Labs  Lab 11/23/23 0553 11/23/23 1408 11/24/23 0451 11/25/23 0749  WBC 7.4  --  11.2* 6.8  RBC 3.43* 3.58* 3.44* 3.42*  HGB 10.5*  --  10.6* 10.4*  HCT 32.8*  --  32.6* 32.7*  MCV 95.6  --  94.8 95.6  MCH 30.6  --  30.8 30.4  MCHC 32.0  --  32.5 31.8  RDW 13.8  --  13.8 13.9  PLT 163  --  166 145*    BNPNo results for input(s): BNP, PROBNP in the last 168 hours.   DDimer No results for input(s): DDIMER in the last 168 hours.   CHA2DS2-VASc Score = 6   This indicates a 9.7% annual risk of stroke. The patient's score is based upon: CHF History: 0 HTN History: 1 Diabetes History: 0 Stroke History: 2 Vascular Disease History: 1 Age Score: 2 Gender Score: 0      Radiology    N/A  Patient Profile     89 y.o. 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   Assessment & Plan    #Bradycardia #Chronic atrial fibrillation - Patient has history of a chronic atrial fibrillation and had pauses up to 4  seconds during sleep by a heart monitor last fall 2024 at which time permanent pacemaker was not recommended - Now having significant bradycardia during sleep and while awake - 2D echo 07/25/2023 with EF greater than 75% with moderate LVH, severe biatrial enlargement, moderate MAC and at least moderate aortic valve stenosis - He has not been on any rate slowing medications at home. - Patient states that he has not had any problems with dizziness, presyncope or syncope except for 1 episode about 10 days ago that was very short-lived.  He has had multiple falls and none of them have been precipitated by dizziness or presyncope or syncope. - He really is asymptomatic with his bradycardia.  His wife was insistent that every time he has fallen and had an injury that he gets bradycardic but then once he has recovered from his injuries his heart rate goes back up to the normal range - Xarelto  currently on hold due to C1 fracture and displaced odontoid fracture and received Kcentra  - Do not think there is any indication for temporary pacing at this time as he remains asymptomatic   #Type II posterior displaced odontoid fracture occurring after mechanical fall with C1 fracture - Neurosurgery following currently recommending nonoperative management - Xarelto  on hold - [place] J collar in place   #Chronic diastolic CHF #Chronic lower extremity edema -He appears euvolemic on exam today -He was taking Demadex  and metolazone  as an outpatient -Found to be in AKI with a a serum creatinine of 1.4 on admission but serum creatinine is down to 1.1 today. -back on metolazone  2.5 mg daily and torsemide  20 mg daily. -SCr stable around 1.2   #Hypertension - Candesartan initially on hold due to bump in SCr - Restarted Irbesartan  37.5mg  daily  - BP 132/89 mmHg this am - SCr stable at 1.2 this am - No beta-blocker due to bradycardia   #Coronary artery calcifications - Noted on chest CT  -  2D echo 07/25/2023 with EF  greater than 75% with moderate LVH - Denies anginal symptoms - Continue Lipitor 5 mg daily   #Ruptured aortic aneurysm/dissection  - status post repair in [place] - CT of the chest showed a 4.7 cm aneurysmal dilatation of the ascending order with no dissection - This can be followed outpatient - Needs good control hypertension  #Aortic Stenosis -moderate by echo 07/2022 but was not complete study at that time -Repeat echo pending today- will follow-up on those results  For questions or updates, please contact [facility] Please consult [email or web address] for contact info under   Vinie KYM Maxcy, MD, [facility], FNLA, FACP  [place]  [facility]  Medical Director of the [facility]id [facility]  Cardiovascular Risk Reduction Clinic Diplomate of the American Board of Clinical Lipidology Attending Cardiologist  Direct Dial: [phone]  Fax: [phone]  Website:  [email or web address]  Vinie JAYSON Maxcy, MD  11/25/2023, 9:37 AM

## 2023-11-25 NOTE — Progress Notes (Signed)
 Speech Language Pathology Treatment: Dysphagia  Patient Details Name: Zachary Underwood MRN: [id number] DOB: 12-Feb-1928 Today's Date: 11/25/2023 Time: 1120-1200 SLP Time Calculation (min) (ACUTE ONLY): 40 min  Assessment / Plan / Recommendation Clinical Impression  Pt was seen for dysphagia treatment with his wife present. Pt and his wife were educated regarding the results of the modified barium swallow study, diet recommendations, and swallowing precautions. Video recording of the study was used to facilitate education and both parties verbalized understanding regarding all areas of consideration. SLP's concerns regarding the pt's swallow function were discussed and all of the questions posed by the pt and his wife were answered. Pt and his wife demonstrated understanding regarding the necessity of swallowing precautions to reduce aspiration risk with nectar thick liquids; pt was able to use strategies in the radiology suite with min prompts. Pt's wife stated that she has not observed him showing signs of aspiration with p.o. intake and that he has been tolerating pills well. SLP advised pt's wife that the pt did not sense the pill during the swallow study when it became lodged in the valleculae so his not reporting difficulty does not suggest swallowing adequacy; she verbalized understanding. Per the pt's wife, pt's speech is acutely slurred and it makes her suspicious of a neurological etiology.  She has requested a speech evaluation and medicine to thin his mucous; Dr. Davia has been advised of the wife's requests. A dysphagia 3 diet with nectar thick liquids is recommended with strict observance of swallowing precautions. SLP will continue to follow pt.    HPI HPI: Pt is a 89 y.o. male who presented to the ED for evaluation after he fell and hit his head and neck. CT head and maxillofacial negative for acute changes. CT cervical spine: mildly displaced fractures through the posterior arch of C1 and  nondisplaced fracture through the midline anterior arch of C1. MR cervical spine: Displaced type 2 odontoid fracture with significant posterior dislocation of the dens relative to the body of C2, osteophyte at C5-6. PMH: persistent atrial fibrillation on Xarelto , CVA, moderate aortic stenosis, s/p surgical repair of ascending thoracic aorta, HTN, HLD, BPH, chronic lower extremity edema.      SLP Plan  MBS          Recommendations  Diet recommendations: Dysphagia 3 (mechanical soft);Nectar-thick liquid Liquids provided via: Cup;No straw Medication Administration: Crushed with puree Supervision: Staff to assist with self feeding Compensations: Slow rate;Small sips/bites;Effortful swallow;Multiple dry swallows after each bite/sip Postural Changes and/or Swallow Maneuvers: Seated upright 90 degrees;Upright 30-60 min after meal                  Oral care BID     Dysphagia, oropharyngeal phase (R13.12)     MBS    Zachary Naji I. [place], MS, CCC-SLP Acute Rehabilitation Services Office number [phone]  Zachary Underwood  11/25/2023, 12:45 PM

## 2023-11-25 NOTE — Progress Notes (Addendum)
 Modified Barium Swallow Study  Patient Details  Name: Zachary Underwood MRN: [id number] Date of Birth: Jul 29, 1927  Today's Date: 11/25/2023  Modified Barium Swallow completed.  Full report located under Chart Review in the Imaging Section.  History of Present Illness Pt is a 89 y.o. male who presented to the ED for evaluation after he fell and hit his head and neck. CT head and maxillofacial negative for acute changes. CT cervical spine: mildly displaced fractures through the posterior arch of C1 and nondisplaced fracture through the midline anterior arch of C1. MR cervical spine: Displaced type 2 odontoid fracture with significant posterior dislocation of the dens relative to the body of C2, osteophyte at C5-6. PMH: persistent atrial fibrillation on Xarelto , CVA, moderate aortic stenosis, s/p surgical repair of ascending thoracic aorta, HTN, HLD, BPH, chronic lower extremity edema.   Clinical Impression Pt presents with oropharyngeal dysphagia characterized by reduced bolus cohesion, lingual retraction, pharyngeal stripping, hyolaryngeal elevation, and anterior laryngeal movement. Premature spillage was noted to the pyriform sinuses and was often resulted in aspiration. Residue was noted on the base of tongue and posterior pharyngeal wall, and in the valleculae and pyriform sinuses. Residue was reduced with secondary swallows. Penetration (PAS 5) and aspiration (PAS 7) were noted with thin liquids, nectar thick liquids, and honey thick liquids. Laryngeal invasion was improved to PAS 2 (occasionally 3) with nectar thick liquids when a combination of reduced bolus size, effortful swallows and secondary swallows were used. Despite use of multiple bolus consistencies and use of effortful swallows, pt was unable to propel the 13mm barium tablet past the level of the valleculae; pt was ultimately able to spit it out. A dysphagia 3 diet with nectar thick liquids is recommended with strict observance of  swallowing precautions. Factors that may increase risk of adverse event in presence of aspiration Zachary Underwood 2021): Weak cough  Swallow Evaluation Recommendations Recommendations: PO diet PO Diet Recommendation: Dysphagia 3 (Mechanical soft);Mildly thick liquids (Level 2, nectar thick) Liquid Administration via: Cup;No straw Medication Administration: Crushed with puree Supervision: Staff to assist with self-feeding;Full supervision/cueing for swallowing strategies;Intermittent supervision/cueing for swallowing strategies Swallowing strategies  : Slow rate;Small bites/sips;Minimize environmental distractions;effortful swallow;Multiple dry swallows after each bite/sip Postural changes: Stay upright 30-60 min after meals;Position pt fully upright for meals Oral care recommendations: Oral care BID (2x/day)  Sativa Gelles I. [place], MS, CCC-SLP Acute Rehabilitation Services Office number [phone]  Zachary Underwood [place] 11/25/2023,12:26 PM

## 2023-11-25 NOTE — Progress Notes (Signed)
 [facility]                                                                              Oluwatobiloba Martin, is a 89 y.o. male, DOB - Jun 30, 1927, FMW:[id number] Admit date - 11/22/2023    Outpatient Primary MD for the patient is [name]  LOS - 3  days  Chief Complaint  Patient presents with   Fall       Brief summary   Patient is a 89 y.o. male with persistent atrial fibrillation on Xarelto , history of CVA, moderate aortic stenosis, s/p surgical repair of ascending thoracic aorta, HTN, HLD, BPH, chronic lower extremity edema presented to the ED for evaluation after he fell and hit his head and neck. Patient was trying to get into his car earlier on the day of admission when he slipped and fell.  He hit his head and neck,did not lose consciousness.  He was having pain at the back of his neck, EMS was called. Patient is functional at baseline and continues to teach as a professor.  CT cervical spine without contrast IMPRESSION: 1. Displaced type II odontoid fracture with approximately 12 mm posterior displacement relative to the body of C2. Associated prevertebral soft tissue swelling and possible epidural hematoma. There is at least moderate spinal canal stenosis at C1-2. Recommend MRI cervical spine for further evaluation. 2. Additional mildly displaced fractures through the posterior arch of C1 and an additional nondisplaced fracture through the midline anterior arch of C1. 3. Similar anterolisthesis of C5 on C6 likely related to degenerative changes.  MRI cervical spine without contrast IMPRESSION: 1. Displaced type 2 odontoid fracture with significant posterior dislocation of the dens relative to the body of C2. Associated edema and possible blood products interposed between dens and C2. No definite epidural hematoma. 2. Displacement of dens as well as malalignment of the bilateral atlantoaxial articulations results in moderate spinal canal stenosis and  ventral cervical cord flattening. 3. Disruption of the anterior longitudinal ligament and posterior longitudinal ligament at C2. Edema along the posterior atlantoaxial membrane suggestive of ligamentous injury. Possible disruption of the ligamentum flavum at C2-3 and C3-4. 4. C1 fractures better seen on CT. 5. Significant prevertebral edema. 6. Disc osteophyte complexes at C5-6 contributing to mild spinal canal stenosis.  Assessment & Plan    Type II posterior displaced odontoid fracture occurring after mechanical fall C1 fracture: - Neurosurgery consulted, evaluated by Dr Lanis, recommended non-operative management at this time.   - Holding Xarelto . Recommended Cervical collar, PT, rehab.  - [place] J collar in place, ongoing management as per neurosurgery. - Pain better controlled,continue celebrex , IV Dilaudid , IV Robaxin , p.o. oxycodone  as needed.  - Continue PPI - Vitamin D  level stable 36.8 - Management per neurosurgery   Persistent atrial fibrillation with bradycardia: - Remains in atrial fibrillation, bradycardic -  Xarelto  on hold.  He was given Kcentra  in the ED. -Continue to hold Xarelto , no epidural hematoma however did have edema and blood products interposed between dens and C2. - Cardiology following, BP now trending up.  Per cardiology, no indication for temporary pacing at this time, remains asymptomatic.  Acute kidney injury: - Mild with creatinine 1.38 on admission, baseline ~1.0.   - Creatinine stable, at baseline, continue Avapro  (candesartan not on formulary)  History of diastolic HF, chronic lower extremity edema - Reviewed cardiology recommendations however patient's wife requested metolazone  and not Demadex . - Continue to follow renal function - Currently on metolazone  2.5 mg daily, chest rhonchorous on lung exam, will check BNP - Chest x-ray showed cardiomegaly with chronic interstitial coarsening, nonspecific,?  Mild pulmonary venous congestion.  If  elevated BNP, will add torsemide  20 mg x 1 - Echo pending  Dysphagia with risk of aspiration - Coarse lung sounds, concerned about aspiration, for now placed on IV Unasyn  per pharmacy - Will check BNP, torsemide  if elevated - Wife declined barium swallow  History of CVA: - Xarelto  on hold as above.   - Continue atorvastatin .   Hypertension: - Continue Avapro , metolazone    Mild anemia: - H/H stable  - Anemia panel showed anemia of chronic disease however iron stores still lower FE 30, percent saturation 10, normal ferritin 216   Hyperlipidemia: Continue atorvastatin .  History of ruptured aortic aneurysm/dissection, -Status post repair in [place].  CT of the chest showed ascending aorta aneurysmal at 4.7 cm with no dissection, -Per cardiology, can be followed outpatient    Estimated body mass index is 28.28 kg/m as calculated from the following:   Height as of this encounter: 5' 8 (1.727 m).   Weight as of this encounter: 84.4 kg.  Code Status: full  DVT Prophylaxis:  SCDs Start: 11/22/23 2105   Level of Care: Level of care: Progressive Family Communication: Updated patient's wife on the phone today, answered all questions and concerns Disposition Plan:      Remains inpatient appropriate:      Procedures:    Consultants:   Neurosurgery   Antimicrobials:   Anti-infectives (From admission, onward)    None          Medications  atorvastatin   5 mg Oral QHS   celecoxib   200 mg Oral BID   feeding supplement  237 mL Oral BID BM   fluticasone   2 spray Each Nare Daily   irbesartan   37.5 mg Oral Daily   loratadine   10 mg Oral Daily   methocarbamol  (ROBAXIN ) injection  500 mg Intravenous Q8H   metolazone   2.5 mg Oral Daily   pantoprazole   40 mg Oral Daily   sodium chloride  flush  3 mL Intravenous Q12H   tamsulosin   0.4 mg Oral QHS      Subjective:   Chaise Mahabir was seen and examined today.  Pain controlled, no acute events overnight.  Has some  shortness of breath with coarse lung sounds.   Objective:   Vitals:   11/24/23 2005 11/24/23 2314 11/25/23 0305 11/25/23 0734  BP: (!) 156/80 (!) 119/90 (!) 150/75 132/89  Pulse: 64 63 70 61  Resp: 19 13 16 12   Temp: 97.7 F (36.5 C) 98.5 F (36.9 C) 97.8 F (36.6 C) (!) 97.5 F (36.4 C)  TempSrc: Oral Oral Oral Oral  SpO2: 100% 95% 97% 100%  Weight:      Height:        Intake/Output Summary (Last 24 hours) at 11/25/2023 1049 Last data filed at 11/25/2023 0530 Gross per 24 hour  Intake 243 ml  Output 650 ml  Net -407 ml     Wt Readings from Last 3 Encounters:  11/22/23 84.4 kg  11/06/23 88.7 kg  07/24/23 83.9 kg   Physical Exam  General: Alert and oriented x 3, NAD, c-collar on Cardiovascular: iregular, bradycardia Respiratory: Coarse rhonchi bilaterally Gastrointestinal: Soft, nontender, nondistended, NBS Ext: Trace pedal edema bilaterally Neuro: no new deficits Psych: Normal affect     Data Reviewed:  I have personally reviewed following labs    CBC Lab Results  Component Value Date   WBC 6.8 11/25/2023   RBC 3.42 (L) 11/25/2023   HGB 10.4 (L) 11/25/2023   HCT 32.7 (L) 11/25/2023   MCV 95.6 11/25/2023   MCH 30.4 11/25/2023   PLT 145 (L) 11/25/2023   MCHC 31.8 11/25/2023   RDW 13.9 11/25/2023   LYMPHSABS 0.7 07/24/2023   MONOABS 0.5 07/24/2023   EOSABS 0.2 07/24/2023   BASOSABS 0.1 07/24/2023     Last metabolic panel Lab Results  Component Value Date   NA 139 11/25/2023   K 3.5 11/25/2023   CL 107 11/25/2023   CO2 24 11/25/2023   BUN 31 (H) 11/25/2023   CREATININE 1.21 11/25/2023   GLUCOSE 95 11/25/2023   GFRNONAA 55 (L) 11/25/2023   CALCIUM  9.3 11/25/2023   PHOS 3.2 11/25/2023   PROT 6.1 (L) 11/22/2023   ALBUMIN 2.8 (L) 11/25/2023   BILITOT 1.1 11/22/2023   ALKPHOS 107 11/22/2023   AST 31 11/22/2023   ALT 19 11/22/2023   ANIONGAP 8 11/25/2023    CBG (last 3)  No results for input(s): GLUCAP in the last 72 hours.     Coagulation Profile: Recent Labs  Lab 11/22/23 1251  INR 2.0*     Radiology Studies: I have personally reviewed the imaging studies  MR Cervical Spine Wo Contrast Addendum Date: 11/22/2023 EXAM: MRI CERVICAL SPINE WITHOUT CONTRAST 11/22/2023 04:20:45 PM TECHNIQUE: Multiplanar multisequence MRI of the cervical spine was performed without the administration of intravenous contrast. COMPARISON: CT cervical spine earlier same day. CLINICAL HISTORY: Better assess seen fracture on CT as well as the hematoma. Pt given morphine  before exam. Difficult time laying flat, in a lot of pain. Propeller sequences ran as repeats. Best images possible. FINDINGS: BONES AND ALIGNMENT: Displaced type 2 odontoid fracture with significant posterior dislocation of the dens relative to the body of C2, similar to the earlier CT. Edema and possible blood products are interposed between the dens and posterior superior body of C2. Displacement of the fracture site indents the ventral thecal sac at C1-2. Vertebral body heights are otherwise maintained. No suspicious osseous lesion. SPINAL CORD: Flattening of the ventral cervical cord without evidence of compression. CSF flow artifact along the ventral aspect of the cervical cord at C1-2, compatible with significant stenosis. SOFT TISSUES: Significant prevertebral soft tissue swelling extending from the skull base to the level of C7-T1. Edema within the paraspinal musculature extending from the skull base to the level of C6. C2-C3: Additional disruption of the posterior longitudinal ligament along the posterior aspect of C2. Possible disruption of the ligamentum flavum at C2-3 and C3-4. Edema along the posterior atlantoaxial membrane suggestive of ligamentous injury. C3-C4: Possible disruption of the ligamentum flavum at C3-4. C4-C5: No significant disc herniation. No spinal canal stenosis or neural foraminal narrowing. C5-C6: Disc osteophyte complex which indents the ventral  thecal sac, contributing to mild spinal canal stenosis. C6-C7: Disc osteophyte complex which indents the ventral thecal sac. Facet arthrosis. C7-T1: No significant disc herniation. No spinal canal stenosis or neural foraminal narrowing. SPINAL CANAL STENOSIS: Moderate spinal canal stenosis at C1-2, best appreciated on sagittal images with limited evaluation on axial images due to positioning and motion artifact. Additional mild  spinal canal stenosis at C5-6. IMPRESSION: 1. Displaced type 2 odontoid fracture with significant posterior dislocation of the dens relative to the body of C2. Associated edema and possible blood products interposed between dens and C2. No definite epidural hematoma. 2. Displacement of dens as well as malalignment of the bilateral atlantoaxial articulations results in moderate spinal canal stenosis and ventral cervical cord flattening. 3. Disruption of the anterior longitudinal ligament and posterior longitudinal ligament at C2. Edema along the posterior atlantoaxial membrane suggestive of ligamentous injury. Possible disruption of the ligamentum flavum at C2-3 and C3-4. 4. C1 fractures better seen on CT. 5. Significant prevertebral edema. 6. Disc osteophyte complexes at C5-6 contributing to mild spinal canal stenosis. 7. Findings discussed with Dr. Cecilia at 5:22 PM on 11/22/23. Electronically signed by: Donnice Mania MD 11/22/2023 05:37 PM EDT RP Workstation: [id number]   Result Date: 11/22/2023 EXAM: MRI CERVICAL SPINE WITHOUT CONTRAST 11/22/2023 04:20:45 PM TECHNIQUE: Multiplanar multisequence MRI of the cervical spine was performed without the administration of intravenous contrast. COMPARISON: CT cervical spine earlier same day. CLINICAL HISTORY: Better assess seen fracture on CT as well as the hematoma. Pt given morphine  before exam. Difficult time laying flat, in a lot of pain. Propeller sequences ran as repeats. Best images possible. FINDINGS: BONES AND ALIGNMENT: Displaced type 2  odontoid fracture with significant posterior dislocation of the dens relative to the body of C2, similar to the earlier CT. Edema and possible blood products are interposed between the dens and posterior superior body of C2. Displacement of the fracture site indents the ventral thecal sac at C1-2. Vertebral body heights are otherwise maintained. No suspicious osseous lesion. SPINAL CORD: Flattening of the ventral cervical cord without evidence of compression. CSF flow artifact along the ventral aspect of the cervical cord at C1-2, compatible with significant stenosis. SOFT TISSUES: Significant prevertebral soft tissue swelling extending from the skull base to the level of C7-T1. Edema within the paraspinal musculature extending from the skull base to the level of C6. C2-C3: Additional disruption of the posterior longitudinal ligament along the posterior aspect of C2. Possible disruption of the ligamentum flavum at C2-3 and C3-4. Edema along the posterior atlantoaxial membrane suggestive of ligamentous injury. C3-C4: Possible disruption of the ligamentum flavum at C3-4. C4-C5: No significant disc herniation. No spinal canal stenosis or neural foraminal narrowing. C5-C6: Disc osteophyte complex which indents the ventral thecal sac, contributing to mild spinal canal stenosis. C6-C7: Disc osteophyte complex which indents the ventral thecal sac. Facet arthrosis. C7-T1: No significant disc herniation. No spinal canal stenosis or neural foraminal narrowing. SPINAL CANAL STENOSIS: Moderate spinal canal stenosis at C1-2, best appreciated on sagittal images with limited evaluation on axial images due to positioning and motion artifact. Additional mild spinal canal stenosis at C5-6. IMPRESSION: 1. Displaced type 2 odontoid fracture with significant posterior dislocation of the dens relative to the body of C2. Associated edema and possible blood products interposed between dens and C2. No definite epidural hematoma. 2.  Displacement of dens as well as malalignment of the bilateral atlantoaxial articulations results in moderate spinal canal stenosis and ventral cervical cord flattening. 3. Disruption of the anterior longitudinal ligament and posterior longitudinal ligament at C2. Edema along the posterior atlantoaxial membrane suggestive of ligamentous injury. Possible disruption of the ligamentum flavum at C2-3 and C3-4. 4. C1 fractures better seen on CT. 5. Significant prevertebral edema. 6. Disc osteophyte complexes at C5-6 contributing to mild spinal canal stenosis. Electronically signed by: Donnice Mania MD 11/22/2023 05:12 PM EDT RP  Workstation: [id number]   CT CERVICAL SPINE WO CONTRAST Addendum Date: 11/22/2023  EXAM: CT CERVICAL SPINE WITHOUT CONTRAST 11/22/2023 01:07:00 PM TECHNIQUE: CT of the cervical spine was performed without the administration of intravenous contrast. Multiplanar reformatted images are provided for review. Automated exposure control, iterative reconstruction, and/or weight based adjustment of the mA/kV was utilized to reduce the radiation dose to as low as reasonably achievable. COMPARISON: CTA head and neck dated 08/16/2023. CLINICAL HISTORY: Polytrauma, blunt. Attempting to get in vehicle with caregiver. Mechanical fall while trying to get in vehicle. Hit head. Laceration to head. Complains of pain in the back of neck. On Xarelto . FINDINGS: CERVICAL SPINE: BONES AND ALIGNMENT: There is a displaced fracture through the base of the dens with approximately 12 mm posterior displacement relative to the body of C2. The dens remains articulated with the anterior arch of C1. There are additional mildly displaced fractures through the posterior arch of C1 along the right and left posterolateral aspects. There is likely an additional nondisplaced fracture through the midline anterior arch of C1. Lucency within the left lateral mass of C2 extending into the left articular pillar favored to reflect a nutrient  vessel foramen baseline appearance on coronal images. Cervical lordosis is maintained. Similar anterolisthesis of C5 on C6 likely related to degenerative changes. There is no compression fracture in the cervical spine. DEGENERATIVE CHANGES: Facet arthrosis and uncovertebral hypertrophy at multiple levels in the cervical spine. SOFT TISSUES: Soft tissue surrounding the dens appears increased from prior and may reflect component of hematoma. There is prevertebral soft tissue swelling extending from C1 to C3-4 possible hematoma and fracture displacement results in at least moderate spinal canal stenosis at C1-2. The paraspinal musculature is unremarkable. IMPRESSION: 1. Displaced type II odontoid fracture with approximately 12 mm posterior displacement relative to the body of C2. Associated prevertebral soft tissue swelling and possible epidural hematoma. There is at least moderate spinal canal stenosis at C1-2. Recommend MRI cervical spine for further evaluation. 2. Additional mildly displaced fractures through the posterior arch of C1 and an additional nondisplaced fracture through the midline anterior arch of C1. 3. Similar anterolisthesis of C5 on C6 likely related to degenerative changes. 4. Findings discussed with Dr. Simon at 1:50PM on 11/22/23. Electronically signed by: Donnice Mania MD 11/22/2023 01:53 PM EDT RP Workstation: [id number]   Result Date: 11/22/2023  EXAM: CT CERVICAL SPINE WITHOUT CONTRAST 11/22/2023 01:07:00 PM TECHNIQUE: CT of the cervical spine was performed without the administration of intravenous contrast. Multiplanar reformatted images are provided for review. Automated exposure control, iterative reconstruction, and/or weight based adjustment of the mA/kV was utilized to reduce the radiation dose to as low as reasonably achievable. COMPARISON: CTA head and neck dated 08/16/2023. CLINICAL HISTORY: Polytrauma, blunt. Attempting to get in vehicle with caregiver. Mechanical fall while trying to  get in vehicle. Hit head. Laceration to head. Complains of pain in the back of neck. On Xarelto . FINDINGS: CERVICAL SPINE: BONES AND ALIGNMENT: There is a displaced fracture through the base of the dens with approximately 12 mm posterior displacement relative to the body of C2. The dens remains articulated with the anterior arch of C1. There are additional mildly displaced fractures through the posterior arch of C1 along the right and left posterolateral aspects. There is likely an additional nondisplaced fracture through the midline anterior arch of C1. Lucency within the left lateral mass of C2 extending into the left articular pillar favored to reflect a nutrient vessel foramen baseline appearance on coronal images. Cervical lordosis  is maintained. Similar anterolisthesis of C5 on C6 likely related to degenerative changes. There is no compression fracture in the cervical spine. DEGENERATIVE CHANGES: Facet arthrosis and uncovertebral hypertrophy at multiple levels in the cervical spine. SOFT TISSUES: Soft tissue surrounding the dens appears increased from prior and may reflect component of hematoma. There is prevertebral soft tissue swelling extending from C1 to C3-4 possible hematoma and fracture displacement results in at least moderate spinal canal stenosis at C1-2. The paraspinal musculature is unremarkable. IMPRESSION: 1. Displaced type II odontoid fracture with approximately 12 mm posterior displacement relative to the body of C2. Associated prevertebral soft tissue swelling and possible epidural hematoma. There is at least moderate spinal canal stenosis at C1-2. Recommend MRI cervical spine for further evaluation. 2. Additional mildly displaced fractures through the posterior arch of C1 and an additional nondisplaced fracture through the midline anterior arch of C1. 3. Similar anterolisthesis of C5 on C6 likely related to degenerative changes. Electronically signed by: Donnice Mania MD 11/22/2023 01:33 PM  EDT RP Workstation: [id number]   CT HEAD WO CONTRAST Result Date: 11/22/2023 CLINICAL DATA:  Provided history: Head trauma, moderate/severe. Facial trauma, blunt. Mechanical fall. Head laceration. Neck pain. On Xarelto . EXAM: CT HEAD WITHOUT CONTRAST CT MAXILLOFACIAL WITHOUT CONTRAST TECHNIQUE: Multidetector CT imaging of the head and maxillofacial structures were performed using the standard protocol without intravenous contrast. Multiplanar CT image reconstructions of the maxillofacial structures were also generated. RADIATION DOSE REDUCTION: This exam was performed according to the departmental dose-optimization program which includes automated exposure control, adjustment of the mA and/or kV according to patient size and/or use of iterative reconstruction technique. COMPARISON:  Brain MRI 07/24/2023. CT angiogram head/neck 07/24/2023. FINDINGS: CT HEAD FINDINGS Brain: Generalized cerebral atrophy. 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 No demarcated cortical infarct. No extra-axial fluid collection. No evidence of an intracranial mass. No midline shift. Vascular: No hyperdense vessel.  Atherosclerotic calcifications. Skull: No calvarial fracture or aggressive osseous lesion. Other: Frontoparietal scalp laceration and hematoma. CT MAXILLOFACIAL FINDINGS Osseous: No acute maxillofacial fracture is identified. Orbits: No acute orbital finding. Sinuses: No significant paranasal sinus disease. Soft tissues: No maxillofacial hematoma appreciable by CT. Other: Acute cervical spine fractures separately reported on same day cervical spine CT. IMPRESSION: CT head: 1.  No evidence of an acute intracranial abnormality. 2. Frontoparietal scalp laceration and hematoma. 3. Generalized cerebral atrophy. 4. 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 CT maxillofacial: 1. No evidence of an acute maxillofacial fracture. 2. Acute cervical spine  fractures separately reported on same day cervical spine CT. Electronically Signed   By: Rockey Childs D.O.   On: 11/22/2023 13:39   CT MAXILLOFACIAL WO CONTRAST Result Date: 11/22/2023 CLINICAL DATA:  Provided history: Head trauma, moderate/severe. Facial trauma, blunt. Mechanical fall. Head laceration. Neck pain. On Xarelto . EXAM: CT HEAD WITHOUT CONTRAST CT MAXILLOFACIAL WITHOUT CONTRAST TECHNIQUE: Multidetector CT imaging of the head and maxillofacial structures were performed using the standard protocol without intravenous contrast. Multiplanar CT image reconstructions of the maxillofacial structures were also generated. RADIATION DOSE REDUCTION: This exam was performed according to the departmental dose-optimization program which includes automated exposure control, adjustment of the mA and/or kV according to patient size and/or use of iterative reconstruction technique. COMPARISON:  Brain MRI 07/24/2023. CT angiogram head/neck 07/24/2023. FINDINGS: CT HEAD FINDINGS Brain: Generalized cerebral atrophy. 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 No demarcated cortical infarct. No extra-axial fluid collection. No  evidence of an intracranial mass. No midline shift. Vascular: No hyperdense vessel.  Atherosclerotic calcifications. Skull: No calvarial fracture or aggressive osseous lesion. Other: Frontoparietal scalp laceration and hematoma. CT MAXILLOFACIAL FINDINGS Osseous: No acute maxillofacial fracture is identified. Orbits: No acute orbital finding. Sinuses: No significant paranasal sinus disease. Soft tissues: No maxillofacial hematoma appreciable by CT. Other: Acute cervical spine fractures separately reported on same day cervical spine CT. IMPRESSION: CT head: 1.  No evidence of an acute intracranial abnormality. 2. Frontoparietal scalp laceration and hematoma. 3. Generalized cerebral atrophy. 4. Known chronic infarcts within the left cerebellar  hemisphere. CT maxillofacial: 1. No evidence of an acute maxillofacial fracture. 2. Acute cervical spine fractures separately reported on same day cervical spine CT. Electronically Signed   By: Rockey Childs D.O.   On: 11/22/2023 13:39   CT CHEST ABDOMEN PELVIS W CONTRAST Result Date: 11/22/2023 CLINICAL DATA:  Fall while trying to get into car. EXAM: CT CHEST, ABDOMEN, AND PELVIS WITH CONTRAST TECHNIQUE: Multidetector CT imaging of the chest, abdomen and pelvis was performed following the standard protocol during bolus administration of intravenous contrast. RADIATION DOSE REDUCTION: This exam was performed according to the departmental dose-optimization program which includes automated exposure control, adjustment of the mA and/or kV according to patient size and/or use of iterative reconstruction technique. CONTRAST:  75mL OMNIPAQUE  IOHEXOL  350 MG/ML SOLN COMPARISON:  April 24, 2022. 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 No dissection is noted. Mild cardiomegaly. No pericardial effusion. Coronary artery calcifications are noted. Mediastinum/Nodes: No enlarged mediastinal, hilar, or axillary lymph nodes. Thyroid gland, trachea, and esophagus demonstrate no significant findings. Lungs/Pleura: No pneumothorax or pleural effusion is noted. No acute pulmonary disease is noted. Musculoskeletal: 8.5 x 4.7 cm bilobed fat containing lesion is noted in left pectoral region which was present on prior exam, but now 13 mm solid nodule is noted. Lipoma or liposarcoma cannot be excluded. No definite osseous abnormality is noted. CT ABDOMEN PELVIS FINDINGS Hepatobiliary: No focal liver abnormality is seen. No gallstones, gallbladder wall thickening, or biliary dilatation. Pancreas: Unremarkable. No pancreatic ductal dilatation or surrounding inflammatory changes. Spleen: Normal in size without focal abnormality.  Adrenals/Urinary Tract: Adrenal glands are unremarkable. Kidneys are normal, without renal calculi, focal lesion, or hydronephrosis. Bladder is unremarkable. Stomach/Bowel: Stomach is unremarkable. No evidence of bowel obstruction or inflammation. Status post appendectomy. Vascular/Lymphatic: Aortic atherosclerosis. No enlarged abdominal or pelvic lymph nodes. Reproductive: Status post prostatic brachytherapy seed placement. Other: No ascites or hernia is noted. Musculoskeletal: Probable old L2 compression fracture. No acute osseous abnormality is noted. 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 Recommend semi-annual imaging followup by CTA or MRA and referral to cardiothoracic surgery if not already obtained. This recommendation follows 2010 ACCF/AHA/AATS/ACR/ASA/SCA/SCAI/SIR/STS/SVM Guidelines for the Diagnosis and Management of Patients With Thoracic Aortic Disease. Circulation. 2010; 121: Z733-z630. Aortic aneurysm NOS (ICD10-I71.9). 8.5 x 4.7 cm bilobed fatty lesion seen in left pectoral region which was present on prior exam of 2024, but now noted 13 mm nodule associated with this lesion. This may simply represent lipoma, but liposarcoma cannot be excluded. MRI may be performed for further evaluation. Status post prostatic brachytherapy seed placement. Probable old L2 compression fracture. Coronary artery calcifications are noted suggesting coronary artery disease. Aortic Atherosclerosis (ICD10-I70.0). Electronically Signed   By: Lynwood Landy Raddle M.D.   On: 11/22/2023 13:33   DG Chest [facility]  1 View Result Date: 11/22/2023 CLINICAL DATA:  Status post fall EXAM: PORTABLE CHEST 1 VIEW COMPARISON:  None Available. FINDINGS: Cardiomediastinal silhouette is enlarged. Aortic knob is calcified. Bilateral vascular congestion and increased interstitial markings of both lung fields. Atherosclerotic calcifications of the aortic annulus is suspected. Query  left basilar atelectasis. No pleural effusion or pneumothorax. Right posterior sixth rib healed fracture. No acute fracture identified. Median sternotomy. IMPRESSION: Enlarged cardiomediastinal silhouette and bilateral vascular congestion. Chronic healed fracture of right sixth rib. No new fracture within the limitations of chest radiograph. Electronically Signed   By: Megan  Zare M.D.   On: 11/22/2023 12:58   DG Pelvis Portable Result Date: 11/22/2023 CLINICAL DATA:  Fall. EXAM: PORTABLE PELVIS 1-2 VIEWS COMPARISON:  None Available. FINDINGS: There is no evidence of pelvic fracture or diastasis. No pelvic bone lesions are seen. IMPRESSION: Negative. Electronically Signed   By: Lynwood Landy Raddle M.D.   On: 11/22/2023 12:40       Selisa Tensley M.D. [facility] 11/25/2023, 10:49 AM  Available via Epic secure chat 7am-7pm After 7 pm, please refer to night coverage provider listed on amion.

## 2023-11-25 NOTE — Progress Notes (Signed)
 Physical Therapy Treatment Patient Details Name: Zachary Underwood MRN: [id number] DOB: Sep 14, 1927 Today's Date: 11/25/2023   History of Present Illness Pt is a 89 y.o. male presenting 8/1 with head/neck pain after a fall at home. MRI C spine with displaced type 2 odontoid fracture with significant posterior dislocation of the dens relative to the body of C2. PMH P A-fib, CVA, moderate aortic stenosis, s/p surgical repair of ascending thoracic aorta, HTN, HLD, BPH, chronic lower extremity edema    PT Comments  Pt progressing towards all goals. Pt with improved pain. Remains to have generalized weakness and deconditioning. Pt with difficulty clearing bilat feet completely during ambulation with mild R antalgia limp. Acute PT to cont to follow. Pt to benefit from continued therapy to improve above deficits to decrease burden of care and improve level of function as pt was still working as a Pharmacist, community.     If plan is discharge home, recommend the following: A little help with walking and/or transfers;A little help with bathing/dressing/bathroom;Assistance with cooking/housework;Assist for transportation;Help with stairs or ramp for entrance   Can travel by private vehicle        Equipment Recommendations  None recommended by PT    Recommendations for Other Services       Precautions / Restrictions Precautions Precautions: Fall;Cervical Precaution Booklet Issued: Yes (comment) Recall of Precautions/Restrictions: Impaired Precaution/Restrictions Comments: precautions reviewed within the context of ADL/mobility; will benefit from further/cont education Required Braces or Orthoses: Cervical Brace Cervical Brace: Hard collar;At all times Restrictions Weight Bearing Restrictions Per Provider Order: No     Mobility  Bed Mobility Overal bed mobility: Needs Assistance Bed Mobility: Rolling, Sit to Sidelying Rolling: Mod assist, Used rails Sidelying to sit: Mod assist, +2 for physical  assistance     Sit to sidelying: Mod assist General bed mobility comments: modA for LE management and to go down onto R elbow vs twist back in bed    Transfers Overall transfer level: Needs assistance Equipment used: Rolling walker (2 wheels) Transfers: Sit to/from Stand Sit to Stand: Min assist           General transfer comment: minA to rise, cues for hand positioning    Ambulation/Gait Ambulation/Gait assistance: Min assist Gait Distance (Feet): 150 Feet Assistive device: Rolling walker (2 wheels) Gait Pattern/deviations: Step-through pattern, Decreased stride length, Decreased dorsiflexion - right, Decreased dorsiflexion - left, Shuffle Gait velocity: decreased Gait velocity interpretation: <1.31 ft/sec, indicative of household ambulator   General Gait Details: progressive shuffling of feet with fatigue, no overt buckling but increased drifting to R and slowed speed. minA for safety and cues for positioning in RW   Stairs             Wheelchair Mobility     Tilt Bed    Modified Rankin (Stroke Patients Only)       Balance Overall balance assessment: Needs assistance Sitting-balance support: No upper extremity supported, Feet supported Sitting balance-Leahy Scale: Good     Standing balance support: Bilateral upper extremity supported, During functional activity Standing balance-Leahy Scale: Fair Standing balance comment: reliant on RW                            Communication Communication Communication: Impaired Factors Affecting Communication: Hearing impaired  Cognition Arousal: Alert Behavior During Therapy: WFL for tasks assessed/performed   PT - Cognitive impairments: No family/caregiver present to determine baseline, Awareness  PT - Cognition Comments: pt able to answer questions WFL, tangential but likely pt's baseline Following commands: Intact      Cueing Cueing Techniques: Verbal cues   Exercises      General Comments General comments (skin integrity, edema, etc.): VSS on 2LO2 via [place]      Pertinent Vitals/Pain Pain Assessment Pain Assessment: Faces Faces Pain Scale: Hurts even more Pain Location: neck with bed mobility Pain Descriptors / Indicators: Discomfort, Grimacing    Home Living                          Prior Function            PT Goals (current goals can now be found in the care plan section) Acute Rehab PT Goals Patient Stated Goal: to return home, reduce pain PT Goal Formulation: With patient Time For Goal Achievement: 12/08/23 Potential to Achieve Goals: Good Progress towards PT goals: Progressing toward goals    Frequency    Min 3X/week      PT Plan      Co-evaluation              AM-PAC PT 6 Clicks Mobility   Outcome Measure  Help needed turning from your back to your side while in a flat bed without using bedrails?: A Lot Help needed moving from lying on your back to sitting on the side of a flat bed without using bedrails?: A Lot Help needed moving to and from a bed to a chair (including a wheelchair)?: A Little Help needed standing up from a chair using your arms (e.g., wheelchair or bedside chair)?: A Little Help needed to walk in hospital room?: A Little Help needed climbing 3-5 steps with a railing? : A Lot 6 Click Score: 15    End of Session Equipment Utilized During Treatment: Gait belt;Cervical collar Activity Tolerance: Patient tolerated treatment well Patient left: with call bell/phone within reach;in bed;with family/visitor present Nurse Communication: Mobility status PT Visit Diagnosis: Unsteadiness on feet (R26.81);Other abnormalities of gait and mobility (R26.89);Repeated falls (R29.6);Muscle weakness (generalized) (M62.81);Difficulty in walking, not elsewhere classified (R26.2)     Time: 8645-8576 PT Time Calculation (min) (ACUTE ONLY): 29 min  Charges:    $Gait Training: 8-22  mins $Therapeutic Activity: 8-22 mins PT General Charges $$ ACUTE PT VISIT: 1 Visit                     Norene Ames, PT, DPT Acute Rehabilitation Services Secure chat preferred Office #: [phone]    Norene CHRISTELLA Ames 11/25/2023, 2:46 PM

## 2023-11-26 ENCOUNTER — Inpatient Hospital Stay (HOSPITAL_COMMUNITY)

## 2023-11-26 DIAGNOSIS — I5032 Chronic diastolic (congestive) heart failure: Secondary | ICD-10-CM | POA: Diagnosis not present

## 2023-11-26 DIAGNOSIS — I482 Chronic atrial fibrillation, unspecified: Secondary | ICD-10-CM | POA: Diagnosis not present

## 2023-11-26 DIAGNOSIS — I7121 Aneurysm of the ascending aorta, without rupture: Secondary | ICD-10-CM

## 2023-11-26 DIAGNOSIS — Z8673 Personal history of transient ischemic attack (TIA), and cerebral infarction without residual deficits: Secondary | ICD-10-CM

## 2023-11-26 DIAGNOSIS — S12100A Unspecified displaced fracture of second cervical vertebra, initial encounter for closed fracture: Secondary | ICD-10-CM | POA: Diagnosis not present

## 2023-11-26 DIAGNOSIS — S0101XA Laceration without foreign body of scalp, initial encounter: Secondary | ICD-10-CM | POA: Diagnosis not present

## 2023-11-26 DIAGNOSIS — W19XXXA Unspecified fall, initial encounter: Secondary | ICD-10-CM | POA: Diagnosis not present

## 2023-11-26 LAB — CBC
HCT: 33 % — ABNORMAL LOW (ref 39.0–52.0)
Hemoglobin: 10.7 g/dL — ABNORMAL LOW (ref 13.0–17.0)
MCH: 30.7 pg (ref 26.0–34.0)
MCHC: 32.4 g/dL (ref 30.0–36.0)
MCV: 94.8 fL (ref 80.0–100.0)
Platelets: 173 K/uL (ref 150–400)
RBC: 3.48 MIL/uL — ABNORMAL LOW (ref 4.22–5.81)
RDW: 14 % (ref 11.5–15.5)
WBC: 6.8 K/uL (ref 4.0–10.5)
nRBC: 0 % (ref 0.0–0.2)

## 2023-11-26 LAB — RENAL FUNCTION PANEL
Albumin: 2.7 g/dL — ABNORMAL LOW (ref 3.5–5.0)
Anion gap: 8 (ref 5–15)
BUN: 35 mg/dL — ABNORMAL HIGH (ref 8–23)
CO2: 24 mmol/L (ref 22–32)
Calcium: 9.2 mg/dL (ref 8.9–10.3)
Chloride: 107 mmol/L (ref 98–111)
Creatinine, Ser: 1.19 mg/dL (ref 0.61–1.24)
GFR, Estimated: 57 mL/min — ABNORMAL LOW (ref >60)
Glucose, Bld: 96 mg/dL (ref 70–99)
Phosphorus: 3.3 mg/dL (ref 2.5–4.6)
Potassium: 3.5 mmol/L (ref 3.5–5.1)
Sodium: 139 mmol/L (ref 135–145)

## 2023-11-26 LAB — D-DIMER, QUANTITATIVE: D-Dimer, Quant: 2.49 ug{FEU}/mL — ABNORMAL HIGH (ref 0.00–0.50)

## 2023-11-26 MED ORDER — POLYETHYLENE GLYCOL 3350 17 G PO PACK
17.0000 g | PACK | Freq: Every day | ORAL | Status: DC
  Administered 2023-11-26 – 2023-11-28 (×3): 17 g via ORAL
  Filled 2023-11-26 (×3): qty 1

## 2023-11-26 MED ORDER — BUPIVACAINE HCL (PF) 0.5 % IJ SOLN
10.0000 mL | Freq: Once | INTRAMUSCULAR | Status: AC
  Administered 2023-11-26: 10 mL
  Filled 2023-11-26: qty 10

## 2023-11-26 MED ORDER — LIDOCAINE 5 % EX PTCH
1.0000 | MEDICATED_PATCH | CUTANEOUS | Status: DC
  Filled 2023-11-26 (×2): qty 1

## 2023-11-26 MED ORDER — METHYLPREDNISOLONE ACETATE 40 MG/ML IJ SUSP
40.0000 mg | Freq: Once | INTRAMUSCULAR | Status: AC
  Administered 2023-11-26: 40 mg via INTRA_ARTICULAR
  Filled 2023-11-26: qty 1

## 2023-11-26 MED ORDER — RIVAROXABAN 15 MG PO TABS
15.0000 mg | ORAL_TABLET | Freq: Every day | ORAL | Status: DC
  Administered 2023-11-26 – 2023-11-27 (×2): 15 mg via ORAL
  Filled 2023-11-26 (×3): qty 1

## 2023-11-26 MED ORDER — SENNOSIDES-DOCUSATE SODIUM 8.6-50 MG PO TABS
1.0000 | ORAL_TABLET | Freq: Two times a day (BID) | ORAL | Status: DC
  Administered 2023-11-26 – 2023-11-28 (×5): 1 via ORAL
  Filled 2023-11-26 (×5): qty 1

## 2023-11-26 MED ORDER — LACTULOSE 10 GM/15ML PO SOLN
20.0000 g | Freq: Once | ORAL | Status: DC
  Filled 2023-11-26: qty 30

## 2023-11-26 MED ORDER — TORSEMIDE 20 MG PO TABS
20.0000 mg | ORAL_TABLET | Freq: Once | ORAL | Status: AC
  Administered 2023-11-27: 20 mg via ORAL
  Filled 2023-11-26: qty 1

## 2023-11-26 MED ORDER — IOHEXOL 350 MG/ML SOLN
75.0000 mL | Freq: Once | INTRAVENOUS | Status: AC | PRN
Start: 2023-11-26 — End: 2023-11-26
  Administered 2023-11-26: 75 mL via INTRAVENOUS

## 2023-11-26 MED ORDER — BISACODYL 10 MG RE SUPP
10.0000 mg | Freq: Once | RECTAL | Status: AC
  Administered 2023-11-26: 10 mg via RECTAL
  Filled 2023-11-26: qty 1

## 2023-11-26 MED ORDER — PANTOPRAZOLE SODIUM 40 MG PO TBEC
40.0000 mg | DELAYED_RELEASE_TABLET | Freq: Two times a day (BID) | ORAL | Status: DC
  Administered 2023-11-26 – 2023-11-28 (×4): 40 mg via ORAL
  Filled 2023-11-26 (×4): qty 1

## 2023-11-26 NOTE — Progress Notes (Signed)
 PHARMACY - ANTICOAGULATION CONSULT NOTE  Pharmacy Consult:  Xarelto   Indication: atrial fibrillation  Allergies  Allergen Reactions   Short Ragweed Pollen Ext Other (See Comments)    Unknown    Beef-Derived Drug Products Other (See Comments)    Patient does not eat beef    Eliquis  [Apixaban ] Itching   Lasix [Furosemide] Itching   Other Other (See Comments) and Hypertension    Fusid- Legs became very swollen   Tylenol [Acetaminophen] Hives    Patient Measurements: Height: 5' 8 (172.7 cm) Weight: 84.4 kg (186 lb) IBW/kg (Calculated) : 68.4 HEPARIN DW (KG): 84.4  Vital Signs: Temp: 97.4 F (36.3 C) (08/05 1231) Temp Source: Axillary (08/05 1231) BP: 141/79 (08/05 1231) Pulse Rate: 69 (08/05 1231)  Labs: Recent Labs    11/24/23 0451 11/25/23 0749 11/26/23 0647  HGB 10.6* 10.4* 10.7*  HCT 32.6* 32.7* 33.0*  PLT 166 145* 173  CREATININE 1.22 1.21 1.19    Estimated Creatinine Clearance: 40.2 mL/min (by C-G formula based on SCr of 1.19 mg/dL).   Medical History: Past Medical History:  Diagnosis Date   Arthritis    Cardiac arrhythmia    Hypertension    Ruptured aortic aneurysm (HCC)    Stroke (HCC) 08/23/2021    Assessment: 7 YOM presented s/p fall with C1/2 fracture.  No surgical intervention.  Patient's Xarelto  was reversed with KCentra .  Pharmacy cleared to resume PTA Xarelto  for history of Afib.  Renal function and CBC stable.  Goal of Therapy:  Appropriate anticoagulation Monitor platelets by anticoagulation protocol: Yes   Plan:  Xarelto  15mg  PO daily with supper Pharmacy will sign off.  Thank you for the consult!  Lonnell Chaput D. [place], PharmD, BCPS, BCCCP 11/26/2023, 2:24 PM

## 2023-11-26 NOTE — Progress Notes (Addendum)
 Patients wife called to check on patient, voiced concerns regarding current plan of care. She expressed frustration with the neurosurgeon refusing to proceed with surgical intervention.  She reports he has also refused to send in another neurosurgeon for a second opinion upon her request. At this time she would like to get ortho involved to discuss bone healing and surgical intervention. Wife also reports noticing recent changes in the patient's speech, including slurring/dysphagia. 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 She is firmly requesting an MRI be done as soon as possible. Wife states that she is looking forward to hearing from Dr. Davia, as she has called her daily since patients admission, but she did not receive a phone call yesterday.  Patients wife was at the bedside during shift change and voiced these concerns at that time as well. Wife requests documentation of concerns.  [facility] paged to inform.

## 2023-11-26 NOTE — Progress Notes (Signed)
 Pt off unit for CT scan. Pt's wife at bedside.

## 2023-11-26 NOTE — Progress Notes (Addendum)
 [facility]                                                                              Zachary Underwood, is a 89 y.o. male, DOB - 1927-07-15, FMW:[id number] Admit date - 11/22/2023    Outpatient Primary MD for the patient is [name]  LOS - 4  days  Chief Complaint  Patient presents with   Fall       Brief summary   Patient is a 89 y.o. male with persistent atrial fibrillation on Xarelto , history of CVA, moderate aortic stenosis, s/p surgical repair of ascending thoracic aorta, HTN, HLD, BPH, chronic lower extremity edema presented to the ED for evaluation after he fell and hit his head and neck. Patient was trying to get into his car earlier on the day of admission when he slipped and fell.  He hit his head and neck,did not lose consciousness.  He was having pain at the back of his neck, EMS was called. Patient is functional at baseline and continues to teach as a professor.  CT cervical spine without contrast IMPRESSION: 1. Displaced type II odontoid fracture with approximately 12 mm posterior displacement relative to the body of C2. Associated prevertebral soft tissue swelling and possible epidural hematoma. There is at least moderate spinal canal stenosis at C1-2. Recommend MRI cervical spine for further evaluation. 2. Additional mildly displaced fractures through the posterior arch of C1 and an additional nondisplaced fracture through the midline anterior arch of C1. 3. Similar anterolisthesis of C5 on C6 likely related to degenerative changes.  MRI cervical spine without contrast IMPRESSION: 1. Displaced type 2 odontoid fracture with significant posterior dislocation of the dens relative to the body of C2. Associated edema and possible blood products interposed between dens and C2. No definite epidural hematoma. 2. Displacement of dens as well as malalignment of the bilateral atlantoaxial articulations results in moderate spinal canal stenosis and  ventral cervical cord flattening. 3. Disruption of the anterior longitudinal ligament and posterior longitudinal ligament at C2. Edema along the posterior atlantoaxial membrane suggestive of ligamentous injury. Possible disruption of the ligamentum flavum at C2-3 and C3-4. 4. C1 fractures better seen on CT. 5. Significant prevertebral edema. 6. Disc osteophyte complexes at C5-6 contributing to mild spinal canal stenosis.  Assessment & Plan    Type II posterior displaced odontoid fracture occurring after mechanical fall C1 fracture: - Neurosurgery was consulted, evaluated by Dr Lanis, recommended non-operative management at this time.   - Holding Xarelto . Recommended Cervical collar, PT - [place] J collar in place, ongoing management as per neurosurgery. - Pain better controlled, continue celebrex , IV Dilaudid , IV Robaxin , p.o. oxycodone  as needed.  - Continue PPI, twice daily for GI prophylaxis - Vitamin D  level stable 36.8 - PT OT evaluation-> home health PT OT, wife requesting rehab -Awaiting recommendations regarding when to resume Xarelto ? - Updated patient's wife, requesting second opinion from [facility] surgery. I requested [facility], Dr Georgina, awaiting response. -MRI brain done overnight, showed no acute stroke Addendum: 1:38 PM Discussed with neurosurgery, Dr. Lanis, okay to resume Xarelto  today   Persistent atrial fibrillation with bradycardia: - Remains in  atrial fibrillation, bradycardic -  Xarelto  on hold.  He was given Kcentra  in the ED. -Continue to hold Xarelto , no epidural hematoma however did have edema and blood products interposed between dens and C2. Messaged Dr Lanis, when to resume Xarelto , awaiting response - Cardiology following, no indication for temporary pacing at this time, remains asymptomatic. Addendum -Cleared by neurosurgery to resume Xarelto    Acute kidney injury: - Mild with creatinine 1.38 on admission, baseline ~1.0.   - Creatinine  stable, at baseline, continue Avapro  (candesartan not on formulary)  History of diastolic HF, chronic lower extremity edema - Reviewed cardiology recommendations however patient's wife requested metolazone  and not Demadex . - Continue to follow renal function - Currently on metolazone  2.5 mg daily, BNP 261 - Chest x-ray showed cardiomegaly with chronic interstitial coarsening, nonspecific,?  Mild pulmonary venous congestion. - Torsemide  20 mg x 1, patient's wife requested no daily torsemide  - 2D echo showed EF of 70 to 75%, no regional WMA, mild mitral stenosis, moderate aortic valve stenosis, moderate dilation of ascending aorta measuring 47 mm  Dysphagia with risk of aspiration - Coarse lung sounds, concerned about aspiration, for now continue IV Unasyn .  - SLP evaluation, recommended dysphagia 3 diet with nectar thick liquids - Noted hypoxia, will order D-dimer, if elevated, will need CTA Addendum D-dimer 2.49 however with inflammation/ infection, inconclusive, will check CTA chest to rule out PE  History of CVA: - Xarelto  on hold as above.   - Continue atorvastatin . -MRI brain overnight showed no acute stroke   Hypertension: - Continue Avapro , metolazone    Mild anemia: - H/H stable  - Anemia panel showed anemia of chronic disease however iron stores still lower FE 30, percent saturation 10, normal ferritin 216  Left knee pain -Orthopedics consulted for cortisone injection   Hyperlipidemia: Continue atorvastatin .  History of ruptured aortic aneurysm/dissection, -Status post repair in [place].  CT of the chest showed ascending aorta aneurysmal at 4.7 cm with no dissection, -Per cardiology, can be followed outpatient    Estimated body mass index is 28.28 kg/m as calculated from the following:   Height as of this encounter: 5' 8 (1.727 m).   Weight as of this encounter: 84.4 kg.  Code Status: full  DVT Prophylaxis:  SCDs Start: 11/22/23 2105   Level of Care: Level of  care: Progressive Family Communication: Updated patient's wife on the phone today.  Patient's wife requested repeat PT evaluation and rehab.  Due to C-spine fracture, frail, multiple falls, she feels it would be difficult for her to get them up in case of another fall.  TOC, PT updated. Disposition Plan:      Remains inpatient appropriate:      Procedures:    Consultants:   Neurosurgery  Cardiology  Antimicrobials:   Anti-infectives (From admission, onward)    Start     Dose/Rate Route Frequency Ordered Stop   11/25/23 1200  Ampicillin -Sulbactam (UNASYN ) 3 g in sodium chloride  0.9 % 100 mL IVPB        3 g 200 mL/hr over 30 Minutes Intravenous Every 6 hours 11/25/23 1103            Medications  atorvastatin   5 mg Oral QHS   celecoxib   200 mg Oral BID   feeding supplement  237 mL Oral BID BM   fluticasone   2 spray Each Nare Daily   irbesartan   37.5 mg Oral Daily   lactulose   20 g Oral Once   loratadine   10 mg Oral Daily  methocarbamol  (ROBAXIN ) injection  500 mg Intravenous Q8H   metolazone   2.5 mg Oral Daily   pantoprazole   40 mg Oral BID AC   polyethylene glycol  17 g Oral Daily   senna-docusate  1 tablet Oral BID   sodium chloride  flush  3 mL Intravenous Q12H   tamsulosin   0.4 mg Oral QHS   torsemide   20 mg Oral Once      Subjective:   Zachary Underwood was seen and examined today.  Pain controlled.  Overnight patient's wife felt he had garbled speech, MRI brain showed no acute stroke.  Noted hypoxia last night, on 3 L O2 via [place]   Objective:   Vitals:   11/26/23 0500 11/26/23 0600 11/26/23 0800 11/26/23 1046  BP:   130/65   Pulse: 65 (!) 59 71 (!) 57  Resp: 14 17 17 17   Temp:   98 F (36.7 C)   TempSrc:   Oral   SpO2: 96% (!) 79% 93% (!) 87%  Weight:      Height:        Intake/Output Summary (Last 24 hours) at 11/26/2023 1118 Last data filed at 11/26/2023 0200 Gross per 24 hour  Intake 300 ml  Output 300 ml  Net 0 ml     Wt Readings from Last 3  Encounters:  11/22/23 84.4 kg  11/06/23 88.7 kg  07/24/23 83.9 kg   Physical Exam General: Alert and oriented x 3, NAD, c-collar on Cardiovascular: Irregular Respiratory: Mild scattered rhonchi bilaterally Gastrointestinal: Soft, nontender, nondistended, NBS Ext: no pedal edema bilaterally Neuro: no new deficits Psych: Normal affect    Data Reviewed:  I have personally reviewed following labs    CBC Lab Results  Component Value Date   WBC 6.8 11/26/2023   RBC 3.48 (L) 11/26/2023   HGB 10.7 (L) 11/26/2023   HCT 33.0 (L) 11/26/2023   MCV 94.8 11/26/2023   MCH 30.7 11/26/2023   PLT 173 11/26/2023   MCHC 32.4 11/26/2023   RDW 14.0 11/26/2023   LYMPHSABS 0.7 07/24/2023   MONOABS 0.5 07/24/2023   EOSABS 0.2 07/24/2023   BASOSABS 0.1 07/24/2023     Last metabolic panel Lab Results  Component Value Date   NA 139 11/26/2023   K 3.5 11/26/2023   CL 107 11/26/2023   CO2 24 11/26/2023   BUN 35 (H) 11/26/2023   CREATININE 1.19 11/26/2023   GLUCOSE 96 11/26/2023   GFRNONAA 57 (L) 11/26/2023   CALCIUM  9.2 11/26/2023   PHOS 3.3 11/26/2023   PROT 6.1 (L) 11/22/2023   ALBUMIN 2.7 (L) 11/26/2023   BILITOT 1.1 11/22/2023   ALKPHOS 107 11/22/2023   AST 31 11/22/2023   ALT 19 11/22/2023   ANIONGAP 8 11/26/2023    CBG (last 3)  No results for input(s): GLUCAP in the last 72 hours.    Coagulation Profile: Recent Labs  Lab 11/22/23 1251  INR 2.0*     Radiology Studies: I have personally reviewed the imaging studies  MR Cervical Spine Wo Contrast Addendum Date: 11/22/2023 EXAM: MRI CERVICAL SPINE WITHOUT CONTRAST 11/22/2023 04:20:45 PM TECHNIQUE: Multiplanar multisequence MRI of the cervical spine was performed without the administration of intravenous contrast. COMPARISON: CT cervical spine earlier same day. CLINICAL HISTORY: Better assess seen fracture on CT as well as the hematoma. Pt given morphine  before exam. Difficult time laying flat, in a lot of pain.  Propeller sequences ran as repeats. Best images possible. FINDINGS: BONES AND ALIGNMENT: Displaced type 2 odontoid fracture with significant  posterior dislocation of the dens relative to the body of C2, similar to the earlier CT. Edema and possible blood products are interposed between the dens and posterior superior body of C2. Displacement of the fracture site indents the ventral thecal sac at C1-2. Vertebral body heights are otherwise maintained. No suspicious osseous lesion. SPINAL CORD: Flattening of the ventral cervical cord without evidence of compression. CSF flow artifact along the ventral aspect of the cervical cord at C1-2, compatible with significant stenosis. SOFT TISSUES: Significant prevertebral soft tissue swelling extending from the skull base to the level of C7-T1. Edema within the paraspinal musculature extending from the skull base to the level of C6. C2-C3: Additional disruption of the posterior longitudinal ligament along the posterior aspect of C2. Possible disruption of the ligamentum flavum at C2-3 and C3-4. Edema along the posterior atlantoaxial membrane suggestive of ligamentous injury. C3-C4: Possible disruption of the ligamentum flavum at C3-4. C4-C5: No significant disc herniation. No spinal canal stenosis or neural foraminal narrowing. C5-C6: Disc osteophyte complex which indents the ventral thecal sac, contributing to mild spinal canal stenosis. C6-C7: Disc osteophyte complex which indents the ventral thecal sac. Facet arthrosis. C7-T1: No significant disc herniation. No spinal canal stenosis or neural foraminal narrowing. SPINAL CANAL STENOSIS: Moderate spinal canal stenosis at C1-2, best appreciated on sagittal images with limited evaluation on axial images due to positioning and motion artifact. Additional mild spinal canal stenosis at C5-6. IMPRESSION: 1. Displaced type 2 odontoid fracture with significant posterior dislocation of the dens relative to the body of C2. Associated  edema and possible blood products interposed between dens and C2. No definite epidural hematoma. 2. Displacement of dens as well as malalignment of the bilateral atlantoaxial articulations results in moderate spinal canal stenosis and ventral cervical cord flattening. 3. Disruption of the anterior longitudinal ligament and posterior longitudinal ligament at C2. Edema along the posterior atlantoaxial membrane suggestive of ligamentous injury. Possible disruption of the ligamentum flavum at C2-3 and C3-4. 4. C1 fractures better seen on CT. 5. Significant prevertebral edema. 6. Disc osteophyte complexes at C5-6 contributing to mild spinal canal stenosis. 7. Findings discussed with Dr. Cecilia at 5:22 PM on 11/22/23. Electronically signed by: Donnice Mania MD 11/22/2023 05:37 PM EDT RP Workstation: [id number]   Result Date: 11/22/2023 EXAM: MRI CERVICAL SPINE WITHOUT CONTRAST 11/22/2023 04:20:45 PM TECHNIQUE: Multiplanar multisequence MRI of the cervical spine was performed without the administration of intravenous contrast. COMPARISON: CT cervical spine earlier same day. CLINICAL HISTORY: Better assess seen fracture on CT as well as the hematoma. Pt given morphine  before exam. Difficult time laying flat, in a lot of pain. Propeller sequences ran as repeats. Best images possible. FINDINGS: BONES AND ALIGNMENT: Displaced type 2 odontoid fracture with significant posterior dislocation of the dens relative to the body of C2, similar to the earlier CT. Edema and possible blood products are interposed between the dens and posterior superior body of C2. Displacement of the fracture site indents the ventral thecal sac at C1-2. Vertebral body heights are otherwise maintained. No suspicious osseous lesion. SPINAL CORD: Flattening of the ventral cervical cord without evidence of compression. CSF flow artifact along the ventral aspect of the cervical cord at C1-2, compatible with significant stenosis. SOFT TISSUES: Significant  prevertebral soft tissue swelling extending from the skull base to the level of C7-T1. Edema within the paraspinal musculature extending from the skull base to the level of C6. C2-C3: Additional disruption of the posterior longitudinal ligament along the posterior aspect of C2. Possible disruption  of the ligamentum flavum at C2-3 and C3-4. Edema along the posterior atlantoaxial membrane suggestive of ligamentous injury. C3-C4: Possible disruption of the ligamentum flavum at C3-4. C4-C5: No significant disc herniation. No spinal canal stenosis or neural foraminal narrowing. C5-C6: Disc osteophyte complex which indents the ventral thecal sac, contributing to mild spinal canal stenosis. C6-C7: Disc osteophyte complex which indents the ventral thecal sac. Facet arthrosis. C7-T1: No significant disc herniation. No spinal canal stenosis or neural foraminal narrowing. SPINAL CANAL STENOSIS: Moderate spinal canal stenosis at C1-2, best appreciated on sagittal images with limited evaluation on axial images due to positioning and motion artifact. Additional mild spinal canal stenosis at C5-6. IMPRESSION: 1. Displaced type 2 odontoid fracture with significant posterior dislocation of the dens relative to the body of C2. Associated edema and possible blood products interposed between dens and C2. No definite epidural hematoma. 2. Displacement of dens as well as malalignment of the bilateral atlantoaxial articulations results in moderate spinal canal stenosis and ventral cervical cord flattening. 3. Disruption of the anterior longitudinal ligament and posterior longitudinal ligament at C2. Edema along the posterior atlantoaxial membrane suggestive of ligamentous injury. Possible disruption of the ligamentum flavum at C2-3 and C3-4. 4. C1 fractures better seen on CT. 5. Significant prevertebral edema. 6. Disc osteophyte complexes at C5-6 contributing to mild spinal canal stenosis. Electronically signed by: Donnice Mania MD  11/22/2023 05:12 PM EDT RP Workstation: [id number]   CT CERVICAL SPINE WO CONTRAST Addendum Date: 11/22/2023  EXAM: CT CERVICAL SPINE WITHOUT CONTRAST 11/22/2023 01:07:00 PM TECHNIQUE: CT of the cervical spine was performed without the administration of intravenous contrast. Multiplanar reformatted images are provided for review. Automated exposure control, iterative reconstruction, and/or weight based adjustment of the mA/kV was utilized to reduce the radiation dose to as low as reasonably achievable. COMPARISON: CTA head and neck dated 08/16/2023. CLINICAL HISTORY: Polytrauma, blunt. Attempting to get in vehicle with caregiver. Mechanical fall while trying to get in vehicle. Hit head. Laceration to head. Complains of pain in the back of neck. On Xarelto . FINDINGS: CERVICAL SPINE: BONES AND ALIGNMENT: There is a displaced fracture through the base of the dens with approximately 12 mm posterior displacement relative to the body of C2. The dens remains articulated with the anterior arch of C1. There are additional mildly displaced fractures through the posterior arch of C1 along the right and left posterolateral aspects. There is likely an additional nondisplaced fracture through the midline anterior arch of C1. Lucency within the left lateral mass of C2 extending into the left articular pillar favored to reflect a nutrient vessel foramen baseline appearance on coronal images. Cervical lordosis is maintained. Similar anterolisthesis of C5 on C6 likely related to degenerative changes. There is no compression fracture in the cervical spine. DEGENERATIVE CHANGES: Facet arthrosis and uncovertebral hypertrophy at multiple levels in the cervical spine. SOFT TISSUES: Soft tissue surrounding the dens appears increased from prior and may reflect component of hematoma. There is prevertebral soft tissue swelling extending from C1 to C3-4 possible hematoma and fracture displacement results in at least moderate spinal canal  stenosis at C1-2. The paraspinal musculature is unremarkable. IMPRESSION: 1. Displaced type II odontoid fracture with approximately 12 mm posterior displacement relative to the body of C2. Associated prevertebral soft tissue swelling and possible epidural hematoma. There is at least moderate spinal canal stenosis at C1-2. Recommend MRI cervical spine for further evaluation. 2. Additional mildly displaced fractures through the posterior arch of C1 and an additional nondisplaced fracture through the midline anterior  arch of C1. 3. Similar anterolisthesis of C5 on C6 likely related to degenerative changes. 4. Findings discussed with Dr. Simon at 1:50PM on 11/22/23. Electronically signed by: Donnice Mania MD 11/22/2023 01:53 PM EDT RP Workstation: [id number]   Result Date: 11/22/2023  EXAM: CT CERVICAL SPINE WITHOUT CONTRAST 11/22/2023 01:07:00 PM TECHNIQUE: CT of the cervical spine was performed without the administration of intravenous contrast. Multiplanar reformatted images are provided for review. Automated exposure control, iterative reconstruction, and/or weight based adjustment of the mA/kV was utilized to reduce the radiation dose to as low as reasonably achievable. COMPARISON: CTA head and neck dated 08/16/2023. CLINICAL HISTORY: Polytrauma, blunt. Attempting to get in vehicle with caregiver. Mechanical fall while trying to get in vehicle. Hit head. Laceration to head. Complains of pain in the back of neck. On Xarelto . FINDINGS: CERVICAL SPINE: BONES AND ALIGNMENT: There is a displaced fracture through the base of the dens with approximately 12 mm posterior displacement relative to the body of C2. The dens remains articulated with the anterior arch of C1. There are additional mildly displaced fractures through the posterior arch of C1 along the right and left posterolateral aspects. There is likely an additional nondisplaced fracture through the midline anterior arch of C1. Lucency within the left lateral mass  of C2 extending into the left articular pillar favored to reflect a nutrient vessel foramen baseline appearance on coronal images. Cervical lordosis is maintained. Similar anterolisthesis of C5 on C6 likely related to degenerative changes. There is no compression fracture in the cervical spine. DEGENERATIVE CHANGES: Facet arthrosis and uncovertebral hypertrophy at multiple levels in the cervical spine. SOFT TISSUES: Soft tissue surrounding the dens appears increased from prior and may reflect component of hematoma. There is prevertebral soft tissue swelling extending from C1 to C3-4 possible hematoma and fracture displacement results in at least moderate spinal canal stenosis at C1-2. The paraspinal musculature is unremarkable. IMPRESSION: 1. Displaced type II odontoid fracture with approximately 12 mm posterior displacement relative to the body of C2. Associated prevertebral soft tissue swelling and possible epidural hematoma. There is at least moderate spinal canal stenosis at C1-2. Recommend MRI cervical spine for further evaluation. 2. Additional mildly displaced fractures through the posterior arch of C1 and an additional nondisplaced fracture through the midline anterior arch of C1. 3. Similar anterolisthesis of C5 on C6 likely related to degenerative changes. Electronically signed by: Donnice Mania MD 11/22/2023 01:33 PM EDT RP Workstation: [id number]   CT HEAD WO CONTRAST Result Date: 11/22/2023 CLINICAL DATA:  Provided history: Head trauma, moderate/severe. Facial trauma, blunt. Mechanical fall. Head laceration. Neck pain. On Xarelto . EXAM: CT HEAD WITHOUT CONTRAST CT MAXILLOFACIAL WITHOUT CONTRAST TECHNIQUE: Multidetector CT imaging of the head and maxillofacial structures were performed using the standard protocol without intravenous contrast. Multiplanar CT image reconstructions of the maxillofacial structures were also generated. RADIATION DOSE REDUCTION: This exam was performed according to the  departmental dose-optimization program which includes automated exposure control, adjustment of the mA and/or kV according to patient size and/or use of iterative reconstruction technique. COMPARISON:  Brain MRI 07/24/2023. CT angiogram head/neck 07/24/2023. FINDINGS: CT HEAD FINDINGS Brain: Generalized cerebral atrophy. 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 No demarcated cortical infarct. No extra-axial fluid collection. No evidence of an intracranial mass. No midline shift. Vascular: No hyperdense vessel.  Atherosclerotic calcifications. Skull: No calvarial fracture or aggressive osseous lesion. Other: Frontoparietal scalp laceration and hematoma. CT MAXILLOFACIAL FINDINGS Osseous: No acute maxillofacial fracture is identified. Orbits:  No acute orbital finding. Sinuses: No significant paranasal sinus disease. Soft tissues: No maxillofacial hematoma appreciable by CT. Other: Acute cervical spine fractures separately reported on same day cervical spine CT. IMPRESSION: CT head: 1.  No evidence of an acute intracranial abnormality. 2. Frontoparietal scalp laceration and hematoma. 3. Generalized cerebral atrophy. 4. 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 CT maxillofacial: 1. No evidence of an acute maxillofacial fracture. 2. Acute cervical spine fractures separately reported on same day cervical spine CT. Electronically Signed   By: Rockey Childs D.O.   On: 11/22/2023 13:39   CT MAXILLOFACIAL WO CONTRAST Result Date: 11/22/2023 CLINICAL DATA:  Provided history: Head trauma, moderate/severe. Facial trauma, blunt. Mechanical fall. Head laceration. Neck pain. On Xarelto . EXAM: CT HEAD WITHOUT CONTRAST CT MAXILLOFACIAL WITHOUT CONTRAST TECHNIQUE: Multidetector CT imaging of the head and maxillofacial structures were performed using the standard protocol without intravenous contrast. Multiplanar CT image reconstructions of the  maxillofacial structures were also generated. RADIATION DOSE REDUCTION: This exam was performed according to the departmental dose-optimization program which includes automated exposure control, adjustment of the mA and/or kV according to patient size and/or use of iterative reconstruction technique. COMPARISON:  Brain MRI 07/24/2023. CT angiogram head/neck 07/24/2023. FINDINGS: CT HEAD FINDINGS Brain: Generalized cerebral atrophy. 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 No demarcated cortical infarct. No extra-axial fluid collection. No evidence of an intracranial mass. No midline shift. Vascular: No hyperdense vessel.  Atherosclerotic calcifications. Skull: No calvarial fracture or aggressive osseous lesion. Other: Frontoparietal scalp laceration and hematoma. CT MAXILLOFACIAL FINDINGS Osseous: No acute maxillofacial fracture is identified. Orbits: No acute orbital finding. Sinuses: No significant paranasal sinus disease. Soft tissues: No maxillofacial hematoma appreciable by CT. Other: Acute cervical spine fractures separately reported on same day cervical spine CT. IMPRESSION: CT head: 1.  No evidence of an acute intracranial abnormality. 2. Frontoparietal scalp laceration and hematoma. 3. Generalized cerebral atrophy. 4. 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 CT maxillofacial: 1. No evidence of an acute maxillofacial fracture. 2. Acute cervical spine fractures separately reported on same day cervical spine CT. Electronically Signed   By: Rockey Childs D.O.   On: 11/22/2023 13:39   CT CHEST ABDOMEN PELVIS W CONTRAST Result Date: 11/22/2023 CLINICAL DATA:  Fall while trying to get into car. EXAM: CT CHEST, ABDOMEN, AND PELVIS WITH CONTRAST TECHNIQUE: Multidetector CT imaging of the chest, abdomen and pelvis was performed following the standard protocol during bolus administration of intravenous contrast. RADIATION DOSE  REDUCTION: This exam was performed according to the departmental dose-optimization program which includes automated exposure control, adjustment of the mA and/or kV according to patient size and/or use of iterative reconstruction technique. CONTRAST:  75mL OMNIPAQUE  IOHEXOL  350 MG/ML SOLN COMPARISON:  April 24, 2022. 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 No dissection is noted. Mild cardiomegaly. No pericardial effusion. Coronary artery calcifications are noted. Mediastinum/Nodes: No enlarged mediastinal, hilar, or axillary lymph nodes. Thyroid gland, trachea, and esophagus demonstrate no significant findings. Lungs/Pleura: No pneumothorax or pleural effusion is noted. No acute pulmonary disease is noted. Musculoskeletal: 8.5 x 4.7 cm bilobed fat containing lesion is noted in left pectoral region which was present on prior exam, but now 13 mm solid nodule is noted. Lipoma or liposarcoma cannot be excluded. No definite osseous abnormality is noted. CT ABDOMEN PELVIS FINDINGS Hepatobiliary: No focal liver abnormality is seen. No gallstones,  gallbladder wall thickening, or biliary dilatation. Pancreas: Unremarkable. No pancreatic ductal dilatation or surrounding inflammatory changes. Spleen: Normal in size without focal abnormality. Adrenals/Urinary Tract: Adrenal glands are unremarkable. Kidneys are normal, without renal calculi, focal lesion, or hydronephrosis. Bladder is unremarkable. Stomach/Bowel: Stomach is unremarkable. No evidence of bowel obstruction or inflammation. Status post appendectomy. Vascular/Lymphatic: Aortic atherosclerosis. No enlarged abdominal or pelvic lymph nodes. Reproductive: Status post prostatic brachytherapy seed placement. Other: No ascites or hernia is noted. Musculoskeletal: Probable old L2 compression fracture. No acute osseous abnormality is noted. IMPRESSION: Status  post surgical repair of ascending thoracic aorta. 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 Recommend semi-annual imaging followup by CTA or MRA and referral to cardiothoracic surgery if not already obtained. This recommendation follows 2010 ACCF/AHA/AATS/ACR/ASA/SCA/SCAI/SIR/STS/SVM Guidelines for the Diagnosis and Management of Patients With Thoracic Aortic Disease. Circulation. 2010; 121: Z733-z630. Aortic aneurysm NOS (ICD10-I71.9). 8.5 x 4.7 cm bilobed fatty lesion seen in left pectoral region which was present on prior exam of 2024, but now noted 13 mm nodule associated with this lesion. This may simply represent lipoma, but liposarcoma cannot be excluded. MRI may be performed for further evaluation. Status post prostatic brachytherapy seed placement. Probable old L2 compression fracture. Coronary artery calcifications are noted suggesting coronary artery disease. Aortic Atherosclerosis (ICD10-I70.0). Electronically Signed   By: Lynwood Landy Raddle M.D.   On: 11/22/2023 13:33   DG Chest Port 1 View Result Date: 11/22/2023 CLINICAL DATA:  Status post fall EXAM: PORTABLE CHEST 1 VIEW COMPARISON:  None Available. FINDINGS: Cardiomediastinal silhouette is enlarged. Aortic knob is calcified. Bilateral vascular congestion and increased interstitial markings of both lung fields. Atherosclerotic calcifications of the aortic annulus is suspected. Query left basilar atelectasis. No pleural effusion or pneumothorax. Right posterior sixth rib healed fracture. No acute fracture identified. Median sternotomy. IMPRESSION: Enlarged cardiomediastinal silhouette and bilateral vascular congestion. Chronic healed fracture of right sixth rib. No new fracture within the limitations of chest radiograph. Electronically Signed   By: Megan  Zare M.D.   On: 11/22/2023 12:58   DG Pelvis Portable Result Date: 11/22/2023 CLINICAL DATA:  Fall. EXAM: PORTABLE PELVIS 1-2 VIEWS COMPARISON:  None Available. FINDINGS: There is no evidence of  pelvic fracture or diastasis. No pelvic bone lesions are seen. IMPRESSION: Negative. Electronically Signed   By: Lynwood Landy Raddle M.D.   On: 11/22/2023 12:40       Loula Marcella M.D. [facility] 11/26/2023, 11:18 AM  Available via Epic secure chat 7am-7pm After 7 pm, please refer to night coverage provider listed on amion.

## 2023-11-26 NOTE — Plan of Care (Signed)
   Problem: Education: Goal: Knowledge of General Education information will improve Description: Including pain rating scale, medication(s)/side effects and non-pharmacologic comfort measures Outcome: Progressing   Problem: Activity: Goal: Risk for activity intolerance will decrease Outcome: Progressing

## 2023-11-26 NOTE — Procedures (Signed)
 Procedure: Left knee aspiration and injection   Indication: Left knee effusion(s)   Surgeon: Ozell Ned, PA-C   Assist: None   Anesthesia: Topical refrigerant   EBL: None   Complications: None   Findings: After risks/benefits explained patient/spouse desires to undergo procedure. Consent obtained and time out performed. The left knee was sterilely prepped and aspirated. 16ml clear dark yellow fluid were obtained. 6ml 0.5% Marcaine  and 40mg  depo-medrol  instilled. Pt tolerated the procedure well.       Ozell DOROTHA Ned, PA-C Orthopedic Surgery [phone]

## 2023-11-26 NOTE — Progress Notes (Signed)
 SLP Cancellation Note  Patient Details Name: KADAN MILLSTEIN MRN: [id number] DOB: 1927/10/24   Cancelled treatment:       Reason Eval/Treat Not Completed: Patient at procedure or test/unavailable. Arrived and pt about to get up  to the bathroom with NT and EKG lining up outside as well. Will f/u   Manolo Bosket, Consuelo Fitch 11/26/2023, 9:00 AM

## 2023-11-26 NOTE — Progress Notes (Signed)
 Pt return to unit from Radiology. Bed alarm on call bell within reach.

## 2023-11-26 NOTE — Consult Note (Signed)
 Reason for Consult:Left knee pain Referring Physician: Ripudeep Rai Time called: 0730 Time at bedside: 0856   Zachary Underwood is an 89 y.o. male.  HPI: Zachary Underwood was admitted after a fall and found to have cervical fxs. He has a long-standing hx/o left knee pain stemming from OA and has had multiple injections in that knee. He is having pain now and he and wife request another injection.  Past Medical History:  Diagnosis Date   Arthritis    Cardiac arrhythmia    Hypertension    Ruptured aortic aneurysm [facility])    Stroke (HCC) 08/23/2021    Past Surgical History:  Procedure Laterality Date   open heart surgery     ruptured aortic aneurysm repair      History reviewed. No pertinent family history.  Social History:  reports that he has quit smoking. His smoking use included cigarettes. He has never used smokeless tobacco. He reports that he does not drink alcohol and does not use drugs.  Allergies:  Allergies  Allergen Reactions   Short Ragweed Pollen Ext Other (See Comments)    Unknown    Beef-Derived Drug Products Other (See Comments)    Patient does not eat beef    Eliquis  [Apixaban ] Itching   Lasix [Furosemide] Itching   Other Other (See Comments) and Hypertension    Fusid- Legs became very swollen   Tylenol [Acetaminophen] Hives    Medications: I have reviewed the patient's current medications.  Results for orders placed or performed during the hospital encounter of 11/22/23 (from the past 48 hours)  CBC     Status: Abnormal   Collection Time: 11/25/23  7:49 AM  Result Value Ref Range   WBC 6.8 4.0 - 10.5 K/uL   RBC 3.42 (L) 4.22 - 5.81 MIL/uL   Hemoglobin 10.4 (L) 13.0 - 17.0 g/dL   HCT 67.2 (L) 60.9 - 47.9 %   MCV 95.6 80.0 - 100.0 fL   MCH 30.4 26.0 - 34.0 pg   MCHC 31.8 30.0 - 36.0 g/dL   RDW 86.0 88.4 - 84.4 %   Platelets 145 (L) 150 - 400 K/uL   nRBC 0.0 0.0 - 0.2 %    Comment: Performed at [facility], [address], [place], [place] [address]   Renal function panel     Status: Abnormal   Collection Time: 11/25/23  7:49 AM  Result Value Ref Range   Sodium 139 135 - 145 mmol/L   Potassium 3.5 3.5 - 5.1 mmol/L   Chloride 107 98 - 111 mmol/L   CO2 24 22 - 32 mmol/L   Glucose, Bld 95 70 - 99 mg/dL    Comment: Glucose reference range applies only to samples taken after fasting for at least 8 hours.   BUN 31 (H) 8 - 23 mg/dL   Creatinine, Ser 8.78 0.61 - 1.24 mg/dL   Calcium  9.3 8.9 - 10.3 mg/dL   Phosphorus 3.2 2.5 - 4.6 mg/dL   Albumin 2.8 (L) 3.5 - 5.0 g/dL   GFR, Estimated 55 (L) >60 mL/min    Comment: (NOTE) Calculated using the CKD-EPI Creatinine Equation (2021)    Anion gap 8 5 - 15    Comment: Performed at [facility], [address], [place], [place] [address]  Brain natriuretic peptide     Status: Abnormal   Collection Time: 11/25/23 12:49 PM  Result Value Ref Range   B Natriuretic Peptide 261.0 (H) 0.0 - 100.0 pg/mL  Comment: Performed at [facility], [address], [place], [place] [address]  CBC     Status: Abnormal   Collection Time: 11/26/23  6:47 AM  Result Value Ref Range   WBC 6.8 4.0 - 10.5 K/uL   RBC 3.48 (L) 4.22 - 5.81 MIL/uL   Hemoglobin 10.7 (L) 13.0 - 17.0 g/dL   HCT 66.9 (L) 60.9 - 47.9 %   MCV 94.8 80.0 - 100.0 fL   MCH 30.7 26.0 - 34.0 pg   MCHC 32.4 30.0 - 36.0 g/dL   RDW 85.9 88.4 - 84.4 %   Platelets 173 150 - 400 K/uL   nRBC 0.0 0.0 - 0.2 %    Comment: Performed at [facility], [address], [place], [place] [address]  Renal function panel     Status: Abnormal   Collection Time: 11/26/23  6:47 AM  Result Value Ref Range   Sodium 139 135 - 145 mmol/L   Potassium 3.5 3.5 - 5.1 mmol/L   Chloride 107 98 - 111 mmol/L   CO2 24 22 - 32 mmol/L   Glucose, Bld 96 70 - 99 mg/dL    Comment: Glucose reference range applies only to samples taken after fasting for at least 8 hours.   BUN 35 (H) 8 - 23 mg/dL   Creatinine, Ser 8.80 0.61 - 1.24 mg/dL   Calcium  9.2 8.9 -  10.3 mg/dL   Phosphorus 3.3 2.5 - 4.6 mg/dL   Albumin 2.7 (L) 3.5 - 5.0 g/dL   GFR, Estimated 57 (L) >60 mL/min    Comment: (NOTE) Calculated using the CKD-EPI Creatinine Equation (2021)    Anion gap 8 5 - 15    Comment: Performed at [facility] : [facility], [address] [place], [place] [address]  D-dimer, quantitative     Status: Abnormal   Collection Time: 11/26/23 11:03 AM  Result Value Ref Range   D-Dimer, Quant 2.49 (H) 0.00 - 0.50 ug/mL-FEU    Comment: (NOTE) At the manufacturer cut-off value of 0.5 g/mL FEU, this assay has a negative predictive value of 95-100%.This assay is intended for use in conjunction with a clinical pretest probability (PTP) assessment model to exclude pulmonary embolism (PE) and deep venous thrombosis (DVT) in outpatients suspected of PE or DVT. Results should be correlated with clinical presentation. Performed at [facility], [address], [place], [place] [address]     DG Knee Complete 4 Views Left Result Date: 11/26/2023 CLINICAL DATA:  Left knee pain. EXAM: LEFT KNEE - COMPLETE 4+ VIEW COMPARISON:  None Available. FINDINGS: Moderate medial tibiofemoral joint space narrowing. Mild tricompartmental peripheral spurring. The bones are subjectively under mineralized. No evidence of fracture, erosion or focal bone abnormality. No significant joint effusion. Peripheral vascular calcifications are seen. IMPRESSION: Mild tricompartmental osteoarthritis, most prominent in the medial tibiofemoral compartment. Electronically Signed   By: Andrea Gasman M.D.   On: 11/26/2023 13:23   MR BRAIN WO CONTRAST Result Date: 11/26/2023 EXAM: MRI BRAIN WITHOUT CONTRAST 11/26/2023 03:40:00 AM TECHNIQUE: Multiplanar multisequence MRI of the head/brain was performed without the administration of intravenous contrast. COMPARISON: 07/24/2023 brain MRI 11/22/2023 CT cervical spine CLINICAL HISTORY: Neuro deficit, acute, stroke suspected. FINDINGS: BRAIN AND VENTRICLES: Old  left cerebellar infarct. No acute infarct. No intracranial hemorrhage. No mass. No midline shift. No hydrocephalus. The sella is unremarkable. Normal flow voids. Minimal nonspecific white matter T2-weighted signal hyperintensities, which may be associated with early chronic small vessel disease or migraine headaches.  Normal volume loss. ORBITS: Ocular lens replacements. No acute abnormality. SINUSES AND MASTOIDS: No acute abnormality. BONES AND SOFT TISSUES: Known C2 fracture with posterior displacement of the dens relative to the C2 body. No acute soft tissue abnormality. IMPRESSION: 1. No acute intracranial abnormality. 2. Old left cerebellar infarct. 3. Minimal nonspecific white matter T2-weighted signal hyperintensities, possibly related to chronic small vessel disease 4. Known C2 fracture Electronically signed by: Franky Stanford MD 11/26/2023 03:47 AM EDT RP Workstation: [id number]   ECHOCARDIOGRAM COMPLETE Result Date: 11/25/2023    ECHOCARDIOGRAM REPORT   Patient Name:   [name] Date of Exam: 11/25/2023 Medical Rec #:  [id number]       Height:       68.0 in Accession #:    [id number]      Weight:       186.0 lb Date of Birth:  1928/02/13       BSA:          1.982 m Patient Age:    89 years        BP:           123/55 mmHg Patient Gender: M               HR:           56 bpm. Exam Location:  Inpatient Procedure: 2D Echo (Both Spectral and Color Flow Doppler were utilized during            procedure). Indications:    aortic stenosis  History:        Patient has prior history of Echocardiogram examinations, most                 recent 07/25/2023. CHF, Arrythmias:Atrial Fibrillation and                 Bradycardia, Signs/Symptoms:Edema; Risk Factors:Hypertension and                 Dyslipidemia.  Sonographer:    Tinnie Barefoot RDCS Referring Phys: [phone] TRACI R TURNER  Sonographer Comments: Technically difficult study due to poor echo windows. Patient supine due to neck injury. IMPRESSIONS  1. Left  ventricular ejection fraction, by estimation, is 70 to 75%. The left ventricle has hyperdynamic function. The left ventricle has no regional wall motion abnormalities. There is mild concentric left ventricular hypertrophy. Left ventricular diastolic function could not be evaluated.  2. Right ventricular systolic function is mildly reduced. The right ventricular size is normal. There is mildly elevated pulmonary artery systolic pressure.  3. Left atrial size was moderately dilated.  4. The mitral valve is degenerative. No evidence of mitral valve regurgitation. Mild mitral stenosis. Severe mitral annular calcification.  5. The aortic valve is normal in structure. There is severe calcifcation of the aortic valve. There is severe thickening of the aortic valve. Aortic valve regurgitation is trivial. Moderate aortic valve stenosis.  6. Aortic dilatation noted. There is moderate dilatation of the ascending aorta, measuring 47 mm.  7. The inferior vena cava is dilated in size with <50% respiratory variability, suggesting right atrial pressure of 15 mmHg. FINDINGS  Left Ventricle: Left ventricular ejection fraction, by estimation, is 70 to 75%. The left ventricle has hyperdynamic function. The left ventricle has no regional wall motion abnormalities. The left ventricular internal cavity size was small. There is mild concentric left ventricular hypertrophy. Left ventricular diastolic function could not be evaluated due to mitral annular calcification (moderate or greater). Left ventricular diastolic function could  not be evaluated. Right Ventricle: The right ventricular size is normal. No increase in right ventricular wall thickness. Right ventricular systolic function is mildly reduced. There is mildly elevated pulmonary artery systolic pressure. The tricuspid regurgitant velocity  is 2.34 m/s, and with an assumed right atrial pressure of 15 mmHg, the estimated right ventricular systolic pressure is 36.9 mmHg. Left Atrium:  Left atrial size was moderately dilated. Right Atrium: Right atrial size was normal in size. Pericardium: There is no evidence of pericardial effusion. Mitral Valve: The mitral valve is degenerative in appearance. Severe mitral annular calcification. No evidence of mitral valve regurgitation. Mild mitral valve stenosis. MV peak gradient, 19.1 mmHg. The mean mitral valve gradient is 9.0 mmHg. Tricuspid Valve: The tricuspid valve is normal in structure. Tricuspid valve regurgitation is mild . No evidence of tricuspid stenosis. Aortic Valve: The aortic valve is normal in structure. There is severe calcifcation of the aortic valve. There is severe thickening of the aortic valve. Aortic valve regurgitation is trivial. Moderate aortic stenosis is present. Aortic valve mean gradient measures 22.0 mmHg. Aortic valve peak gradient measures 44.1 mmHg. Aortic valve area, by VTI measures 1.63 cm. Pulmonic Valve: The pulmonic valve was normal in structure. Pulmonic valve regurgitation is not visualized. No evidence of pulmonic stenosis. Aorta: The aortic root is normal in size and structure and aortic dilatation noted. There is moderate dilatation of the ascending aorta, measuring 47 mm. Venous: The inferior vena cava is dilated in size with less than 50% respiratory variability, suggesting right atrial pressure of 15 mmHg. IAS/Shunts: No atrial level shunt detected by color flow Doppler.  LEFT VENTRICLE PLAX 2D LVIDd:         3.10 cm LVIDs:         1.80 cm LV PW:         1.40 cm LV IVS:        1.20 cm LVOT diam:     2.10 cm LV SV:         97 LV SV Index:   49 LVOT Area:     3.46 cm  RIGHT VENTRICLE            IVC RV Basal diam:  2.90 cm    IVC diam: 2.40 cm RV S prime:     9.25 cm/s TAPSE (M-mode): 0.9 cm LEFT ATRIUM             Index        RIGHT ATRIUM           Index LA diam:        5.10 cm 2.57 cm/m   RA Area:     19.10 cm LA Vol (A2C):   79.1 ml 39.92 ml/m  RA Volume:   49.70 ml  25.08 ml/m LA Vol (A4C):   76.6 ml  38.65 ml/m LA Biplane Vol: 78.1 ml 39.41 ml/m  AORTIC VALVE AV Area (Vmax):    1.28 cm AV Area (Vmean):   1.50 cm AV Area (VTI):     1.63 cm AV Vmax:           332.00 cm/s AV Vmean:          186.600 cm/s AV VTI:            0.595 m AV Peak Grad:      44.1 mmHg AV Mean Grad:      22.0 mmHg LVOT Vmax:         122.67 cm/s LVOT Vmean:  81.033 cm/s LVOT VTI:          0.279 m LVOT/AV VTI ratio: 0.47  AORTA Ao Root diam: 3.80 cm Ao Asc diam:  4.60 cm MITRAL VALVE              TRICUSPID VALVE MV Area VTI:  2.07 cm    TR Peak grad:   21.9 mmHg MV Peak grad: 19.1 mmHg   TR Vmax:        234.00 cm/s MV Mean grad: 9.0 mmHg MV Vmax:      2.18 m/s    SHUNTS MV Vmean:     104.4 cm/s  Systemic VTI:  0.28 m                           Systemic Diam: 2.10 cm Morene Brownie Electronically signed by Morene Brownie Signature Date/Time: 11/25/2023/4:54:49 PM    Final    DG Swallowing Func-Speech Pathology Result Date: 11/25/2023 Table formatting from the original result was not included. Modified Barium Swallow Study Patient Details Name: CAMAR GUYTON MRN: [id number] Date of Birth: 11-10-1927 Today's Date: 11/25/2023 HPI/PMH: HPI: Pt is a 89 y.o. male who presented to the ED for evaluation after he fell and hit his head and neck. CT head and maxillofacial negative for acute changes. CT cervical spine: mildly displaced fractures through the posterior arch of C1 and nondisplaced fracture through the midline anterior arch of C1. MR cervical spine: Displaced type 2 odontoid fracture with significant posterior dislocation of the dens relative to the body of C2, osteophyte at C5-6. PMH: persistent atrial fibrillation on Xarelto , CVA, moderate aortic stenosis, s/p surgical repair of ascending thoracic aorta, HTN, HLD, BPH, chronic lower extremity edema. Clinical Impression: Clinical Impression: Pt presents with oropharyngeal dysphagia characterized by reduced bolus cohesion, lingual retraction, pharyngeal stripping, hyolaryngeal  elevation, and anterior laryngeal movement. Premature spillage was noted to the pyriform sinuses and was often resulted in aspiration. Residue was noted on the base of tongue and posterior pharyngeal wall, and in the valleculae and pyriform sinuses. Residue was reduced with secondary swallows. Penetration (PAS 5) and aspiration (PAS 7) were noted with thin liquids, nectar thick liquids, and honey thick liquids. Laryngeal invasion was improved to PAS 2 (occasionally 3) with nectar thick liquids when a combination of reduced bolus size, effortful swallows and secondary swallows were used. Despite use of multiple bolus consistencies and use of effortful swallows, pt was unable to propel the 13mm barium tablet past the level of the valleculae. A dysphagia 3 diet with nectar thick liquids is recommended with strict observance of swallowing precautions. Factors that may increase risk of adverse event in presence of aspiration [name] 2021): Factors that may increase risk of adverse event in presence of aspiration [name] 2021): Weak cough Recommendations/Plan: Swallowing Evaluation Recommendations Swallowing Evaluation Recommendations Recommendations: PO diet PO Diet Recommendation: Dysphagia 3 (Mechanical soft); Mildly thick liquids (Level 2, nectar thick) Liquid Administration via: Cup; No straw Medication Administration: Crushed with puree Supervision: Staff to assist with self-feeding; Full supervision/cueing for swallowing strategies; Intermittent supervision/cueing for swallowing strategies Swallowing strategies  : Slow rate; Small bites/sips; Minimize environmental distractions; effortful swallow; Multiple dry swallows after each bite/sip Postural changes: Stay upright 30-60 min after meals; Position pt fully upright for meals Oral care recommendations: Oral care BID (2x/day) Treatment Plan Treatment Plan Treatment recommendations: Therapy as outlined in treatment plan below Follow-up  recommendations: Home health SLP Functional status assessment: Patient  has had a recent decline in their functional status and demonstrates the ability to make significant improvements in function in a reasonable and predictable amount of time. Treatment frequency: Min 2x/week Treatment duration: 2 weeks Interventions: Patient/family education; Trials of upgraded texture/liquids; Diet toleration management by SLP Recommendations Recommendations for follow up therapy are one component of a multi-disciplinary discharge planning process, led by the attending physician.  Recommendations may be updated based on patient status, additional functional criteria and insurance authorization. Assessment: Orofacial Exam: Orofacial Exam Oral Cavity: Oral Hygiene: WFL Oral Cavity - Dentition: Adequate natural dentition Anatomy: Anatomy: WFL Boluses Administered: Boluses Administered Boluses Administered: Thin liquids (Level 0); Mildly thick liquids (Level 2, nectar thick); Moderately thick liquids (Level 3, honey thick); Puree; Solid  Oral Impairment Domain: Oral Impairment Domain Lip Closure: No labial escape Tongue control during bolus hold: Posterior escape of greater than half of bolus Bolus preparation/mastication: Timely and efficient chewing and mashing Bolus transport/lingual motion: Delayed initiation of tongue motion (oral holding) Oral residue: Trace residue lining oral structures Location of oral residue : Tongue Initiation of pharyngeal swallow : Pyriform sinuses; Posterior laryngeal surface of the epiglottis  Pharyngeal Impairment Domain: Pharyngeal Impairment Domain Soft palate elevation: No bolus between soft palate (SP)/pharyngeal wall (PW) Laryngeal elevation: Partial superior movement of thyroid cartilage/partial approximation of arytenoids to epiglottic petiole Anterior hyoid excursion: Partial anterior movement Epiglottic movement: Partial inversion Laryngeal vestibule closure: Incomplete, narrow column  air/contrast in laryngeal vestibule Pharyngeal stripping wave : Present - diminished Pharyngeal contraction (A/P view only): N/A Pharyngoesophageal segment opening: Complete distension and complete duration, no obstruction of flow Tongue base retraction: Wide column of contrast or air between tongue base and PPW Pharyngeal residue: Collection of residue within or on pharyngeal structures Location of pharyngeal residue: Valleculae; Pharyngeal wall; Pyriform sinuses; Tongue base  Esophageal Impairment Domain: Esophageal Impairment Domain Esophageal clearance upright position: Complete clearance, esophageal coating Pill: Pill Consistency administered: Thin liquids (Level 0); Mildly thick liquids (Level 2, nectar thick); Moderately thick liquids (Level 3, honey thick); Puree (Transport of pill halted at the valleculae) Thin liquids (Level 0): Impaired (see clinical impressions) Mildly thick liquids (Level 2, nectar thick): Impaired (see clinical impressions) Moderately thick liquids (Level 3, honey thick): Impaired (see clinical impressions) Puree: Impaired (see clinical impressions) Penetration/Aspiration Scale Score: Penetration/Aspiration Scale Score 1.  Material does not enter airway: Puree; Solid 2.  Material enters airway, remains ABOVE vocal cords then ejected out: Mildly thick liquids (Level 2, nectar thick) 5.  Material enters airway, CONTACTS cords and not ejected out: Thin liquids (Level 0); Mildly thick liquids (Level 2, nectar thick); Moderately thick liquids (Level 3, honey thick) 7.  Material enters airway, passes BELOW cords and not ejected out despite cough attempt by patient: Mildly thick liquids (Level 2, nectar thick); Thin liquids (Level 0); Moderately thick liquids (Level 3, honey thick) Compensatory Strategies: Compensatory Strategies Compensatory strategies: Yes Effortful swallow: Effective Effective Effortful Swallow: Mildly thick liquid (Level 2, nectar thick) Multiple swallows: Effective  Effective Multiple Swallows: Mildly thick liquid (Level 2, nectar thick)   General Information: Caregiver present: No  Diet Prior to this Study: Dysphagia 3 (mechanical soft); Thin liquids (Level 0)   Temperature : Normal   Respiratory Status: WFL   Supplemental O2: Nasal cannula   History of Recent Intubation: No  Behavior/Cognition: Alert; Cooperative Self-Feeding Abilities: Able to self-feed Baseline vocal quality/speech: Normal Volitional Cough: Able to elicit Volitional Swallow: Able to elicit Exam Limitations: No limitations Goal Planning: Prognosis for improved oropharyngeal function:  Good Barriers to Reach Goals: Severity of deficits No data recorded Patient/Family Stated Goal: none stated Consulted and agree with results and recommendations: Patient Pain: Pain Assessment Pain Assessment: Faces Faces Pain Scale: 6 Pain Location: neck with bed mobility Pain Descriptors / Indicators: Discomfort; Grimacing Pain Intervention(s): Limited activity within patient's tolerance; Monitored during session End of Session: Start Time:SLP Start Time (ACUTE ONLY): 1007 Stop Time: SLP Stop Time (ACUTE ONLY): 1038 Time Calculation:SLP Time Calculation (min) (ACUTE ONLY): 31 min Charges: SLP Evaluations $ SLP Speech Visit: 1 Visit SLP Evaluations $BSS Swallow: 1 Procedure $MBS Swallow: 1 Procedure SLP visit diagnosis: SLP Visit Diagnosis: Dysphagia, unspecified (R13.10) Past Medical History: Past Medical History: Diagnosis Date  Arthritis   Cardiac arrhythmia   Hypertension   Ruptured aortic aneurysm (HCC)   Stroke (HCC) 08/23/2021 Past Surgical History: Past Surgical History: Procedure Laterality Date  open heart surgery    ruptured aortic aneurysm repair   Shanika I. [place], MS, CCC-SLP Acute Rehabilitation Services Office number [phone] [name] [place] 11/25/2023, 12:30 PM  DG CHEST PORT 1 VIEW Result Date: 11/25/2023 CLINICAL DATA:  Shortness of breath EXAM: PORTABLE CHEST 1 VIEW COMPARISON:  11/22/2023 FINDINGS:  artifact from presumed breathing mask projects over the lung apices. Patient rotated left. Moderate cardiomegaly. Median sternotomy. Atherosclerosis in the transverse aorta. No pleural effusion or pneumothorax. Chronic interstitial coarsening is nonspecific in this age group. No lobar consolidation. IMPRESSION: Cardiomegaly with chronic interstitial coarsening, nonspecific. Cannot exclude mild pulmonary venous congestion. Aortic Atherosclerosis (ICD10-I70.0). Electronically Signed   By: Rockey Kilts M.D.   On: 11/25/2023 08:27    Review of Systems  HENT:  Negative for ear discharge, ear pain, hearing loss and tinnitus.   Eyes:  Negative for photophobia and pain.  Respiratory:  Negative for cough and shortness of breath.   Cardiovascular:  Positive for chest pain.  Gastrointestinal:  Negative for abdominal pain, nausea and vomiting.  Genitourinary:  Negative for dysuria, flank pain, frequency and urgency.  Musculoskeletal:  Positive for arthralgias (Left knee) and neck pain. Negative for back pain and myalgias.  Neurological:  Negative for dizziness and headaches.  Hematological:  Does not bruise/bleed easily.  Psychiatric/Behavioral:  The patient is not nervous/anxious.    Blood pressure (!) 141/79, pulse 69, temperature (!) 97.4 F (36.3 C), temperature source Axillary, resp. rate 10, height 5' 8 (1.727 m), weight 84.4 kg, SpO2 95%. Physical Exam Constitutional:      General: He is not in acute distress.    Appearance: He is well-developed. He is not diaphoretic.  HENT:     Head: Normocephalic and atraumatic.  Eyes:     General: No scleral icterus.       Right eye: No discharge.        Left eye: No discharge.     Conjunctiva/sclera: Conjunctivae normal.  Cardiovascular:     Rate and Rhythm: Normal rate and regular rhythm.  Pulmonary:     Effort: Pulmonary effort is normal. No respiratory distress.  Musculoskeletal:     Cervical back: Normal range of motion.     Comments: LLE No  traumatic wounds, ecchymosis, or rash  Mod TTP knee  Mild knee effusion  Sens DPN, SPN, TN intact  Motor EHL, ext, flex, evers 5/5  DP 2+, PT 2+, No significant edema  Skin:    General: Skin is warm and dry.  Neurological:     Mental Status: He is alert.  Psychiatric:        Mood and Affect:  Mood normal.        Behavior: Behavior normal.     Assessment/Plan: Left knee pain -- Will give steroid injection later today.    Ozell DOROTHA Ned, PA-C Orthopedic Surgery [phone] 11/26/2023, 1:31 PM

## 2023-11-26 NOTE — Progress Notes (Addendum)
 Progress Note  Patient Name: Zachary Underwood Date of Encounter: 11/26/2023  [facility] Cardiologist: Dorn Lesches, MD    Subjective   HR stable overnight- remains in afib. Lowest HR briefly while asleep in the 40's. Echo yesterday shows LVEF 70-75%, mildly reduced RV systolic function - there is moderate aortic stenosis with a mean gradient of 22 mmHg and DI of 0.28 -  this appears stable compared to prior limited echo in 07/2023 (which I interpreted) - the ascending aorta is better visualized in the study and noted to be dilated to 47 mm.  Inpatient Medications    Scheduled Meds:  atorvastatin   5 mg Oral QHS   celecoxib   200 mg Oral BID   feeding supplement  237 mL Oral BID BM   fluticasone   2 spray Each Nare Daily   irbesartan   37.5 mg Oral Daily   lactulose   20 g Oral Once   loratadine   10 mg Oral Daily   methocarbamol  (ROBAXIN ) injection  500 mg Intravenous Q8H   metolazone   2.5 mg Oral Daily   pantoprazole   40 mg Oral Daily   polyethylene glycol  17 g Oral Daily   senna-docusate  1 tablet Oral BID   sodium chloride  flush  3 mL Intravenous Q12H   tamsulosin   0.4 mg Oral QHS   Continuous Infusions:  ampicillin -sulbactam (UNASYN ) IV 3 g (11/26/23 0705)   PRN Meds: hydrALAZINE , HYDROmorphone  (DILAUDID ) injection, ondansetron  **OR** ondansetron  (ZOFRAN ) IV, oxyCODONE , sodium chloride    Vital Signs    Vitals:   11/26/23 0400 11/26/23 0500 11/26/23 0600 11/26/23 0800  BP:    130/65  Pulse:  65 (!) 59 71  Resp: 17 14 17 17   Temp:    98 F (36.7 C)  TempSrc:    Oral  SpO2:  96% (!) 79% 93%  Weight:      Height:        Intake/Output Summary (Last 24 hours) at 11/26/2023 0843 Last data filed at 11/26/2023 0200 Gross per 24 hour  Intake 300 ml  Output 300 ml  Net 0 ml      11/22/2023   12:20 PM 11/06/2023    2:46 PM 07/24/2023    9:28 PM  Last 3 Weights  Weight (lbs) 186 lb 195 lb 9.6 oz 185 lb  Weight (kg) 84.369 kg 88.724 kg 83.915 kg      Telemetry     Atrial Fibrillation with CVR - Personally Reviewed  ECG    N/A  Physical Exam   GEN: No acute distress.   Neck:neck collar present. Multiple excoriations on his head Cardiac: irregularly irregular, 3/6 SEM at RUSB, no rubs or gallops.  Respiratory: Clear to auscultation bilaterally. GI: Soft, nontender, non-distended  MS: No edema; No deformity. Neuro:  Nonfocal  Psych: Normal affect   Labs    High Sensitivity Troponin:  No results for input(s): TROPONINIHS in the last 720 hours.    Chemistry Recent Labs  Lab 11/22/23 1221 11/22/23 1241 11/24/23 0451 11/25/23 0749 11/26/23 0647  NA 138   < > 138 139 139  K 3.9   < > 3.7 3.5 3.5  CL 107   < > 106 107 107  CO2 24   < > 22 24 24   GLUCOSE 137*   < > 113* 95 96  BUN 27*   < > 29* 31* 35*  CREATININE 1.38*   < > 1.22 1.21 1.19  CALCIUM  9.4   < > 9.3 9.3 9.2  PROT 6.1*  --   --   --   --   ALBUMIN 3.3*  --  2.9* 2.8* 2.7*  AST 31  --   --   --   --   ALT 19  --   --   --   --   ALKPHOS 107  --   --   --   --   BILITOT 1.1  --   --   --   --   GFRNONAA 47*   < > 55* 55* 57*  ANIONGAP 7   < > 10 8 8    < > = values in this interval not displayed.     Hematology Recent Labs  Lab 11/24/23 0451 11/25/23 0749 11/26/23 0647  WBC 11.2* 6.8 6.8  RBC 3.44* 3.42* 3.48*  HGB 10.6* 10.4* 10.7*  HCT 32.6* 32.7* 33.0*  MCV 94.8 95.6 94.8  MCH 30.8 30.4 30.7  MCHC 32.5 31.8 32.4  RDW 13.8 13.9 14.0  PLT 166 145* 173    BNP Recent Labs  Lab 11/25/23 1249  BNP 261.0*     DDimer No results for input(s): DDIMER in the last 168 hours.   CHA2DS2-VASc Score = 6   This indicates a 9.7% annual risk of stroke. The patient's score is based upon: CHF History: 0 HTN History: 1 Diabetes History: 0 Stroke History: 2 Vascular Disease History: 1 Age Score: 2 Gender Score: 0      Radiology    N/A  Patient Profile     89 y.o. 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   Assessment & Plan    #Bradycardia #Permanent atrial fibrillation - Patient has history of a chronic atrial fibrillation and had pauses up to 4 seconds during sleep by a heart monitor last fall 2024 at which time permanent pacemaker was not recommended - Now having significant bradycardia during sleep and while awake - 2D echo 07/25/2023 with EF greater than 75% with moderate LVH, severe biatrial enlargement, moderate MAC and at least moderate aortic valve stenosis - He has not been on any rate slowing medications at home. - Patient states that he has not had any problems with dizziness, presyncope or syncope except for 1 episode about 10 days ago that was very short-lived.  He has had multiple falls and none of them have been precipitated by dizziness or presyncope or syncope. - He really is asymptomatic with his bradycardia.  His wife was insistent that every time he has fallen and had an injury that he gets bradycardic but then once he has recovered from his injuries his heart rate goes back up to the normal range - Xarelto  currently on hold due to C1 fracture and displaced odontoid fracture and received Kcentra  - Do not think there is any indication for temporary pacing at this time as he remains asymptomatic   #Type II posterior displaced odontoid fracture occurring after mechanical fall with C1 fracture - Neurosurgery following currently recommending nonoperative management - Xarelto  on hold - [place] J collar in place - If neurosurgery is pursued, he would be at moderate to high risk given age and moderate aortic stenosis, however, the risk is not likely prohibitive for surgery and could be undertaken if considered necessary.   #Chronic diastolic CHF #Chronic lower extremity edema -He appears euvolemic on exam today -  He was taking Demadex  and metolazone  as an  outpatient -Found to be in AKI with a a serum creatinine of 1.4 on admission but serum creatinine is down to 1.1 today. -back on metolazone  2.5 mg daily and torsemide  20 mg daily. -SCr stable around 1.2 - Repeat echo shows preserved LVEF 70-75%   #Hypertension - Candesartan initially on hold due to bump in SCr - Restarted Irbesartan  37.5mg  daily  - BP 130/65 mmHg this am - SCr stable at 1.2 this am - No beta-blocker due to bradycardia   #Coronary artery calcifications - Noted on chest CT  -2D echo 07/25/2023 with EF greater than 75% with moderate LVH - Denies anginal symptoms - Continue Lipitor 5 mg daily   #Ruptured aortic aneurysm/dissection  - status post repair in [place] - CT of the chest showed a 4.7 cm aneurysmal dilatation of the ascending order with no dissection - echo confirms this measurment - This can be followed outpatient - Needs good control hypertension  #Aortic Stenosis -moderate by echo 07/2022 but was not complete study at that time  - repeat echo shows moderate AS - MG 22 mmHg, DI 0.28. -would repeat echo in 6-12 months.  [facility] will sign off.   Medication Recommendations:  continue current meds Other recommendations (labs, testing, etc):  restart anticoagulation when safe per neurosurgery Follow up as an outpatient:  Dr. Court or APP - appointment scheduled for 11/29/23 with Katyln West, NP   For questions or updates, please contact [facility] Please consult [email or web address] for contact info under   Vinie KYM Maxcy, MD, [facility], FNLA, FACP  [place]  [facility]  Medical Director of the [facility] Lipid [facility] &  Cardiovascular Risk Reduction Clinic Diplomate of the American Board of Clinical Lipidology Attending Cardiologist  Direct Dial: [phone]  Fax: [phone]  Website:  [email or web address]  Vinie JAYSON Maxcy, MD  11/26/2023, 8:43 AM

## 2023-11-26 NOTE — Progress Notes (Signed)
 OT Cancellation Note  Patient Details Name: Zachary Underwood MRN: [id number] DOB: 1928/01/11   Cancelled Treatment:    Reason Eval/Treat Not Completed: Other (comment) (pt sleeping on arrival with RN reporting unable to rouse to sign consent form, so will return as schedule allows.)  Evelisse Szalkowski D Walton, OTD, OTR/L [facility] Acute Rehabilitation Office: [phone]   Zachary Underwood 11/26/2023, 3:00 PM

## 2023-11-26 NOTE — Progress Notes (Signed)
 Pt off unit to radiology with Armida SANTA RN and transport.

## 2023-11-26 NOTE — Progress Notes (Signed)
Pt return to unit from CT scan

## 2023-11-27 DIAGNOSIS — S0101XA Laceration without foreign body of scalp, initial encounter: Secondary | ICD-10-CM | POA: Diagnosis not present

## 2023-11-27 DIAGNOSIS — W19XXXA Unspecified fall, initial encounter: Secondary | ICD-10-CM | POA: Diagnosis not present

## 2023-11-27 DIAGNOSIS — S12100A Unspecified displaced fracture of second cervical vertebra, initial encounter for closed fracture: Secondary | ICD-10-CM | POA: Diagnosis not present

## 2023-11-27 LAB — CBC
HCT: 33.7 % — ABNORMAL LOW (ref 39.0–52.0)
Hemoglobin: 10.7 g/dL — ABNORMAL LOW (ref 13.0–17.0)
MCH: 30.3 pg (ref 26.0–34.0)
MCHC: 31.8 g/dL (ref 30.0–36.0)
MCV: 95.5 fL (ref 80.0–100.0)
Platelets: 178 K/uL (ref 150–400)
RBC: 3.53 MIL/uL — ABNORMAL LOW (ref 4.22–5.81)
RDW: 14 % (ref 11.5–15.5)
WBC: 6.6 K/uL (ref 4.0–10.5)
nRBC: 0 % (ref 0.0–0.2)

## 2023-11-27 LAB — RENAL FUNCTION PANEL
Albumin: 2.9 g/dL — ABNORMAL LOW (ref 3.5–5.0)
Anion gap: 10 (ref 5–15)
BUN: 35 mg/dL — ABNORMAL HIGH (ref 8–23)
CO2: 23 mmol/L (ref 22–32)
Calcium: 9.6 mg/dL (ref 8.9–10.3)
Chloride: 107 mmol/L (ref 98–111)
Creatinine, Ser: 1.3 mg/dL — ABNORMAL HIGH (ref 0.61–1.24)
GFR, Estimated: 51 mL/min — ABNORMAL LOW (ref >60)
Glucose, Bld: 116 mg/dL — ABNORMAL HIGH (ref 70–99)
Phosphorus: 3.8 mg/dL (ref 2.5–4.6)
Potassium: 4.3 mmol/L (ref 3.5–5.1)
Sodium: 140 mmol/L (ref 135–145)

## 2023-11-27 MED ORDER — HYDROMORPHONE HCL 1 MG/ML IJ SOLN: 0.5000 mg | INTRAMUSCULAR | Status: DC | PRN

## 2023-11-27 MED ORDER — OXYCODONE HCL 5 MG PO TABS: 2.5000 mg | ORAL_TABLET | ORAL | Status: DC | PRN

## 2023-11-27 NOTE — Progress Notes (Signed)
 Physical Therapy Treatment Patient Details Name: Zachary Underwood MRN: [id number] DOB: 22-Dec-1927 Today's Date: 11/27/2023   History of Present Illness Pt is a 89 y.o. male presenting 8/1 with head/neck pain after a fall at home. MRI C spine with displaced type 2 odontoid fracture with significant posterior dislocation of the dens relative to the body of C2. PMH P A-fib, CVA, moderate aortic stenosis, s/p surgical repair of ascending thoracic aorta, HTN, HLD, BPH, chronic lower extremity edema    PT Comments  Patient progressing with mobility.  From conversation with wife feel he will need to be able to mobilize more independently at least to bathroom and for bed mobility.  He has high fall risk with shuffling gait, limited visual inspection of feet with c-collar and HOH.  Feel he will benefit from post-acute inpatient rehab (>3 hours/day) prior to d/c home with wife support.  PT will continue to follow.     If plan is discharge home, recommend the following: A little help with walking and/or transfers;A little help with bathing/dressing/bathroom;Help with stairs or ramp for entrance;Assistance with cooking/housework   Can travel by private vehicle        Equipment Recommendations  None recommended by PT    Recommendations for Other Services Rehab consult     Precautions / Restrictions Precautions Precautions: Fall;Cervical Precaution/Restrictions Comments: precautions reviewed for UE lifting Required Braces or Orthoses: Cervical Brace Cervical Brace: At all times;Hard collar     Mobility  Bed Mobility               General bed mobility comments: in recliner    Transfers Overall transfer level: Needs assistance Equipment used: Rolling walker (2 wheels) Transfers: Sit to/from Stand Sit to Stand: Min assist           General transfer comment: assist for balance    Ambulation/Gait Ambulation/Gait assistance: Contact guard assist, Min assist Gait Distance (Feet): 150  Feet Assistive device: Rolling walker (2 wheels) Gait Pattern/deviations: Step-through pattern, Decreased stride length, Shuffle, Decreased dorsiflexion - left, Decreased dorsiflexion - right       General Gait Details: CGA to min A at times for balance with turns and for walker proximity   Stairs             Wheelchair Mobility     Tilt Bed    Modified Rankin (Stroke Patients Only)       Balance Overall balance assessment: Needs assistance Sitting-balance support: Feet supported Sitting balance-Leahy Scale: Good Sitting balance - Comments: heavy UE support on arms of chair to pull forward, can sit once upright with S   Standing balance support: Bilateral upper extremity supported Standing balance-Leahy Scale: Poor Standing balance comment: UE support for balance and CGA                            Communication Communication Communication: Impaired Factors Affecting Communication: Hearing impaired  Cognition Arousal: Alert Behavior During Therapy: WFL for tasks assessed/performed   PT - Cognitive impairments: Difficult to assess                       PT - Cognition Comments: wife present and answering everything for pt, he follows commands well and can communicate despite HOH with hearing aides though wife likes to repeat louder to make sure he hears Following commands: Intact      Cueing Cueing Techniques: Verbal cues  Exercises General Exercises -  Lower Extremity Ankle Circles/Pumps: AROM, 10 reps, Seated, Both Long Arc Quad: AROM, Both, 10 reps, Seated Hip Flexion/Marching: AROM, Both, 10 reps, Seated    General Comments General comments (skin integrity, edema, etc.): on RW throughout VSS; wife present and with multiple questions, initiated education on CGA for ambulation though discussed her concerns for home soon so agrees to inpatient rehab consult      Pertinent Vitals/Pain Pain Assessment Faces Pain Scale: Hurts a little  bit Pain Location: L knee with exercises Pain Descriptors / Indicators: Discomfort, Grimacing Pain Intervention(s): Monitored during session    Home Living                          Prior Function            PT Goals (current goals can now be found in the care plan section) Progress towards PT goals: Progressing toward goals    Frequency    Min 3X/week      PT Plan      Co-evaluation              AM-PAC PT 6 Clicks Mobility   Outcome Measure  Help needed turning from your back to your side while in a flat bed without using bedrails?: A Lot Help needed moving from lying on your back to sitting on the side of a flat bed without using bedrails?: A Lot Help needed moving to and from a bed to a chair (including a wheelchair)?: A Little Help needed standing up from a chair using your arms (e.g., wheelchair or bedside chair)?: A Little Help needed to walk in hospital room?: A Little Help needed climbing 3-5 steps with a railing? : Total 6 Click Score: 14    End of Session Equipment Utilized During Treatment: Gait belt;Cervical collar Activity Tolerance: Patient tolerated treatment well Patient left: in chair;with call bell/phone within reach;with chair alarm set;with family/visitor present   PT Visit Diagnosis: Other abnormalities of gait and mobility (R26.89);History of falling (Z91.81);Muscle weakness (generalized) (M62.81)     Time: 8759-8676 PT Time Calculation (min) (ACUTE ONLY): 43 min  Charges:    $Gait Training: 8-22 mins $Therapeutic Exercise: 8-22 mins $Self Care/Home Management: 8-22                       Zachary Underwood, PT Acute Rehabilitation Services Office:[phone] 11/27/2023    Zachary Underwood 11/27/2023, 2:31 PM

## 2023-11-27 NOTE — TOC Progression Note (Signed)
 Transition of Care [facility]) - Progression Note    Patient Details  Name: Zachary Underwood MRN: [id number] Date of Birth: Jan 22, 1928  [facility]) CM/SW Contact  Montie LOISE Louder, [place] Phone Number: 11/27/2023, 11:26 AM  Clinical Narrative:     Patient's spouse reports jury duty on Thursday, requested note to provide to be excused- note placed on the chart.  Montie Louder, MSW, LCSW Clinical Social Worker                      Expected Discharge Plan and Services                                               Social Drivers of Health (SDOH) Interventions SDOH Screenings   Food Insecurity: No Food Insecurity (11/23/2023)  Housing: Unknown (11/23/2023)  Transportation Needs: No Transportation Needs (11/23/2023)  Utilities: Not At Risk (11/23/2023)  Depression (PHQ2-9): Low Risk  (09/04/2021)  Financial Resource Strain: Low Risk  (09/04/2021)  Social Connections: Unknown (11/23/2023)  Stress: No Stress Concern Present (09/04/2021)  Tobacco Use: Medium Risk (11/22/2023)    Readmission Risk Interventions     No data to display

## 2023-11-27 NOTE — Progress Notes (Signed)
   Inpatient Rehab Admissions Coordinator :  Per request of Dr Juvenal to assess, patient was screened for CIR candidacy by [name] Leavell RN MSN. Patient has had a decline in function since initial eval.  At this time patient appears to be a potential candidate for CIR. I will place a rehab consult per protocol for full assessment. Please call me with any questions.  [name] Leavell RN MSN Admissions Coordinator [phone]

## 2023-11-27 NOTE — Progress Notes (Signed)
 [facility]                                                                              Zachary Underwood, is a 89 y.o. male, DOB - 1927/10/15, FMW:[id number] Admit date - 11/22/2023    Outpatient Primary MD for the patient is [name]  LOS - 5  days  Chief Complaint  Patient presents with   Fall       Brief summary   Patient is a 89 y.o. male with persistent atrial fibrillation on Xarelto , history of CVA, moderate aortic stenosis, s/p surgical repair of ascending thoracic aorta, HTN, HLD, BPH, chronic lower extremity edema presented to the ED for evaluation after he fell and hit his head and neck. Patient was trying to get into his car earlier on the day of admission when he slipped and fell.  He hit his head and neck,did not lose consciousness.  He was having pain at the back of his neck, EMS was called. Patient is functional at baseline and continues to teach as a professor. Seen by neurosurgery, who recommended nonoperative management with follow-up in his office in 2 weeks.  PT OT-CIR   Assessment & Plan    Type II posterior displaced odontoid fracture occurring after mechanical fall C1 fracture: - Neurosurgery was consulted, evaluated by Dr Lanis, recommended non-operative management at this time.   - [place] J collar in place, ongoing management as per neurosurgery. - Pain better controlled, continue celebrex , Dilaudid , IV Robaxin , p.o. oxycodone  as needed.  - Continue PPI, twice daily for GI prophylaxis - Vitamin D  level stable 36.8 - PT OT evaluation-> recommending CIR-consult placed -MRI brain done overnight, showed no acute stroke -Wife would like to follow-up and have a second opinion by orthospine-Dr. Ry reach out to Dr. Georgina with no response, have reached out to see if Dr. Burnetta will see patient as patient also follows with Dr. Ernie of [facility]   Persistent atrial fibrillation with bradycardia: - Remains in atrial fibrillation,  bradycardic -  Xarelto  on hold.  He was given Kcentra  in the ED. -no epidural hematoma however did have edema and blood products interposed between dens and C2.  - Cardiology following, no indication for temporary pacing at this time, remains asymptomatic.  Acute kidney injury: - Mild with creatinine 1.38 on admission, baseline ~1.0.   - Creatinine stable, at baseline, continue Avapro  (candesartan not on formulary)  History of diastolic HF, chronic lower extremity edema - Reviewed cardiology recommendations however patient's wife requested metolazone  and not Demadex . - Continue to follow renal function - Currently on metolazone  2.5 mg daily, BNP 261 - Chest x-ray showed cardiomegaly with chronic interstitial coarsening, nonspecific,?  Mild pulmonary venous congestion. - Torsemide  20 mg x 1, patient's wife requested no daily torsemide  - 2D echo showed EF of 70 to 75%, no regional WMA, mild mitral stenosis, moderate aortic valve stenosis, moderate dilation of ascending aorta measuring 47 mm  Dysphagia with risk of aspiration - Coarse lung sounds, concerned about aspiration, for now continue IV Unasyn .  - SLP evaluation, recommended dysphagia 3 diet with nectar thick liquids - CTA with ? PNA  vs atelectasis   History of CVA: - Xarelto  on hold as above.   - Continue atorvastatin . -MRI brain overnight showed no acute stroke   Hypertension: - Continue Avapro , metolazone    Mild anemia: - H/H stable  - Anemia panel showed anemia of chronic disease however iron stores still lower FE 30, percent saturation 10, normal ferritin 216  Left knee pain -Orthopedics consulted for cortisone injection   Hyperlipidemia: Continue atorvastatin .  History of ruptured aortic aneurysm/dissection, -Status post repair in [place].  CT of the chest showed ascending aorta aneurysmal at 4.7 cm with no dissection, -Per cardiology, can be followed outpatient    Estimated body mass index is 30.3 kg/m as  calculated from the following:   Height as of this encounter: 5' 8 (1.727 m).   Weight as of this encounter: 90.4 kg.  Code Status: full  DVT Prophylaxis:  SCDs Start: 11/22/23 2105 Rivaroxaban  (XARELTO ) tablet 15 mg   Level of Care: Level of care: Progressive Family Communication: Updated patient's wife on the phone today Disposition Plan:      Remains inpatient appropriate:         Consultants:   Neurosurgery  Cardiology      Medications  atorvastatin   5 mg Oral QHS   celecoxib   200 mg Oral BID   feeding supplement  237 mL Oral BID BM   fluticasone   2 spray Each Nare Daily   irbesartan   37.5 mg Oral Daily   lactulose   20 g Oral Once   lidocaine   1 patch Transdermal Q24H   loratadine   10 mg Oral Daily   methocarbamol  (ROBAXIN ) injection  500 mg Intravenous Q8H   metolazone   2.5 mg Oral Daily   pantoprazole   40 mg Oral BID AC   polyethylene glycol  17 g Oral Daily   Rivaroxaban   15 mg Oral Q supper   senna-docusate  1 tablet Oral BID   sodium chloride  flush  3 mL Intravenous Q12H   tamsulosin   0.4 mg Oral QHS   torsemide   20 mg Oral Once      Subjective:   Per wife improved from few days ago   Objective:   Vitals:   11/26/23 2248 11/27/23 0319 11/27/23 0802 11/27/23 1144  BP: (!) 150/65 (!) 147/74 (!) 161/59 (!) 146/61  Pulse: 60 63 71 (!) 54  Resp: 17 19 (!) 22 (!) 28  Temp: 98.5 F (36.9 C) 98.1 F (36.7 C) 97.9 F (36.6 C) (!) 96.5 F (35.8 C)  TempSrc: Oral Axillary Oral Oral  SpO2: 98% 98% 97% 97%  Weight:      Height:        Intake/Output Summary (Last 24 hours) at 11/27/2023 1313 Last data filed at 11/27/2023 0809 Gross per 24 hour  Intake 120 ml  Output 1300 ml  Net -1180 ml     Wt Readings from Last 3 Encounters:  11/26/23 90.4 kg  11/06/23 88.7 kg  07/24/23 83.9 kg   Physical Exam Wet sounding voice, mildly hard of hearing  Data Reviewed:  I have personally reviewed following labs    CBC Lab Results  Component Value  Date   WBC 6.6 11/27/2023   RBC 3.53 (L) 11/27/2023   HGB 10.7 (L) 11/27/2023   HCT 33.7 (L) 11/27/2023   MCV 95.5 11/27/2023   MCH 30.3 11/27/2023   PLT 178 11/27/2023   MCHC 31.8 11/27/2023   RDW 14.0 11/27/2023   LYMPHSABS 0.7 07/24/2023   MONOABS 0.5 07/24/2023   EOSABS  0.2 07/24/2023   BASOSABS 0.1 07/24/2023     Last metabolic panel Lab Results  Component Value Date   NA 140 11/27/2023   K 4.3 11/27/2023   CL 107 11/27/2023   CO2 23 11/27/2023   BUN 35 (H) 11/27/2023   CREATININE 1.30 (H) 11/27/2023   GLUCOSE 116 (H) 11/27/2023   GFRNONAA 51 (L) 11/27/2023   CALCIUM  9.6 11/27/2023   PHOS 3.8 11/27/2023   PROT 6.1 (L) 11/22/2023   ALBUMIN 2.9 (L) 11/27/2023   BILITOT 1.1 11/22/2023   ALKPHOS 107 11/22/2023   AST 31 11/22/2023   ALT 19 11/22/2023   ANIONGAP 10 11/27/2023    CBG (last 3)  No results for input(s): GLUCAP in the last 72 hours.    Coagulation Profile: Recent Labs  Lab 11/22/23 1251  INR 2.0*     Radiology Studies: I have personally reviewed the imaging studies  MR Cervical Spine Wo Contrast Addendum Date: 11/22/2023 EXAM: MRI CERVICAL SPINE WITHOUT CONTRAST 11/22/2023 04:20:45 PM TECHNIQUE: Multiplanar multisequence MRI of the cervical spine was performed without the administration of intravenous contrast. COMPARISON: CT cervical spine earlier same day. CLINICAL HISTORY: Better assess seen fracture on CT as well as the hematoma. Pt given morphine  before exam. Difficult time laying flat, in a lot of pain. Propeller sequences ran as repeats. Best images possible. FINDINGS: BONES AND ALIGNMENT: Displaced type 2 odontoid fracture with significant posterior dislocation of the dens relative to the body of C2, similar to the earlier CT. Edema and possible blood products are interposed between the dens and posterior superior body of C2. Displacement of the fracture site indents the ventral thecal sac at C1-2. Vertebral body heights are otherwise  maintained. No suspicious osseous lesion. SPINAL CORD: Flattening of the ventral cervical cord without evidence of compression. CSF flow artifact along the ventral aspect of the cervical cord at C1-2, compatible with significant stenosis. SOFT TISSUES: Significant prevertebral soft tissue swelling extending from the skull base to the level of C7-T1. Edema within the paraspinal musculature extending from the skull base to the level of C6. C2-C3: Additional disruption of the posterior longitudinal ligament along the posterior aspect of C2. Possible disruption of the ligamentum flavum at C2-3 and C3-4. Edema along the posterior atlantoaxial membrane suggestive of ligamentous injury. C3-C4: Possible disruption of the ligamentum flavum at C3-4. C4-C5: No significant disc herniation. No spinal canal stenosis or neural foraminal narrowing. C5-C6: Disc osteophyte complex which indents the ventral thecal sac, contributing to mild spinal canal stenosis. C6-C7: Disc osteophyte complex which indents the ventral thecal sac. Facet arthrosis. C7-T1: No significant disc herniation. No spinal canal stenosis or neural foraminal narrowing. SPINAL CANAL STENOSIS: Moderate spinal canal stenosis at C1-2, best appreciated on sagittal images with limited evaluation on axial images due to positioning and motion artifact. Additional mild spinal canal stenosis at C5-6. IMPRESSION: 1. Displaced type 2 odontoid fracture with significant posterior dislocation of the dens relative to the body of C2. Associated edema and possible blood products interposed between dens and C2. No definite epidural hematoma. 2. Displacement of dens as well as malalignment of the bilateral atlantoaxial articulations results in moderate spinal canal stenosis and ventral cervical cord flattening. 3. Disruption of the anterior longitudinal ligament and posterior longitudinal ligament at C2. Edema along the posterior atlantoaxial membrane suggestive of ligamentous  injury. Possible disruption of the ligamentum flavum at C2-3 and C3-4. 4. C1 fractures better seen on CT. 5. Significant prevertebral edema. 6. Disc osteophyte complexes at C5-6 contributing to  mild spinal canal stenosis. 7. Findings discussed with Dr. Cecilia at 5:22 PM on 11/22/23. Electronically signed by: Donnice Mania MD 11/22/2023 05:37 PM EDT RP Workstation: [id number]   Result Date: 11/22/2023 EXAM: MRI CERVICAL SPINE WITHOUT CONTRAST 11/22/2023 04:20:45 PM TECHNIQUE: Multiplanar multisequence MRI of the cervical spine was performed without the administration of intravenous contrast. COMPARISON: CT cervical spine earlier same day. CLINICAL HISTORY: Better assess seen fracture on CT as well as the hematoma. Pt given morphine  before exam. Difficult time laying flat, in a lot of pain. Propeller sequences ran as repeats. Best images possible. FINDINGS: BONES AND ALIGNMENT: Displaced type 2 odontoid fracture with significant posterior dislocation of the dens relative to the body of C2, similar to the earlier CT. Edema and possible blood products are interposed between the dens and posterior superior body of C2. Displacement of the fracture site indents the ventral thecal sac at C1-2. Vertebral body heights are otherwise maintained. No suspicious osseous lesion. SPINAL CORD: Flattening of the ventral cervical cord without evidence of compression. CSF flow artifact along the ventral aspect of the cervical cord at C1-2, compatible with significant stenosis. SOFT TISSUES: Significant prevertebral soft tissue swelling extending from the skull base to the level of C7-T1. Edema within the paraspinal musculature extending from the skull base to the level of C6. C2-C3: Additional disruption of the posterior longitudinal ligament along the posterior aspect of C2. Possible disruption of the ligamentum flavum at C2-3 and C3-4. Edema along the posterior atlantoaxial membrane suggestive of ligamentous injury. C3-C4: Possible  disruption of the ligamentum flavum at C3-4. C4-C5: No significant disc herniation. No spinal canal stenosis or neural foraminal narrowing. C5-C6: Disc osteophyte complex which indents the ventral thecal sac, contributing to mild spinal canal stenosis. C6-C7: Disc osteophyte complex which indents the ventral thecal sac. Facet arthrosis. C7-T1: No significant disc herniation. No spinal canal stenosis or neural foraminal narrowing. SPINAL CANAL STENOSIS: Moderate spinal canal stenosis at C1-2, best appreciated on sagittal images with limited evaluation on axial images due to positioning and motion artifact. Additional mild spinal canal stenosis at C5-6. IMPRESSION: 1. Displaced type 2 odontoid fracture with significant posterior dislocation of the dens relative to the body of C2. Associated edema and possible blood products interposed between dens and C2. No definite epidural hematoma. 2. Displacement of dens as well as malalignment of the bilateral atlantoaxial articulations results in moderate spinal canal stenosis and ventral cervical cord flattening. 3. Disruption of the anterior longitudinal ligament and posterior longitudinal ligament at C2. Edema along the posterior atlantoaxial membrane suggestive of ligamentous injury. Possible disruption of the ligamentum flavum at C2-3 and C3-4. 4. C1 fractures better seen on CT. 5. Significant prevertebral edema. 6. Disc osteophyte complexes at C5-6 contributing to mild spinal canal stenosis. Electronically signed by: Donnice Mania MD 11/22/2023 05:12 PM EDT RP Workstation: [id number]   CT CERVICAL SPINE WO CONTRAST Addendum Date: 11/22/2023  EXAM: CT CERVICAL SPINE WITHOUT CONTRAST 11/22/2023 01:07:00 PM TECHNIQUE: CT of the cervical spine was performed without the administration of intravenous contrast. Multiplanar reformatted images are provided for review. Automated exposure control, iterative reconstruction, and/or weight based adjustment of the mA/kV was utilized to  reduce the radiation dose to as low as reasonably achievable. COMPARISON: CTA head and neck dated 08/16/2023. CLINICAL HISTORY: Polytrauma, blunt. Attempting to get in vehicle with caregiver. Mechanical fall while trying to get in vehicle. Hit head. Laceration to head. Complains of pain in the back of neck. On Xarelto . FINDINGS: CERVICAL SPINE: BONES AND ALIGNMENT: There  is a displaced fracture through the base of the dens with approximately 12 mm posterior displacement relative to the body of C2. The dens remains articulated with the anterior arch of C1. There are additional mildly displaced fractures through the posterior arch of C1 along the right and left posterolateral aspects. There is likely an additional nondisplaced fracture through the midline anterior arch of C1. Lucency within the left lateral mass of C2 extending into the left articular pillar favored to reflect a nutrient vessel foramen baseline appearance on coronal images. Cervical lordosis is maintained. Similar anterolisthesis of C5 on C6 likely related to degenerative changes. There is no compression fracture in the cervical spine. DEGENERATIVE CHANGES: Facet arthrosis and uncovertebral hypertrophy at multiple levels in the cervical spine. SOFT TISSUES: Soft tissue surrounding the dens appears increased from prior and may reflect component of hematoma. There is prevertebral soft tissue swelling extending from C1 to C3-4 possible hematoma and fracture displacement results in at least moderate spinal canal stenosis at C1-2. The paraspinal musculature is unremarkable. IMPRESSION: 1. Displaced type II odontoid fracture with approximately 12 mm posterior displacement relative to the body of C2. Associated prevertebral soft tissue swelling and possible epidural hematoma. There is at least moderate spinal canal stenosis at C1-2. Recommend MRI cervical spine for further evaluation. 2. Additional mildly displaced fractures through the posterior arch of C1  and an additional nondisplaced fracture through the midline anterior arch of C1. 3. Similar anterolisthesis of C5 on C6 likely related to degenerative changes. 4. Findings discussed with Dr. Simon at 1:50PM on 11/22/23. Electronically signed by: Donnice Mania MD 11/22/2023 01:53 PM EDT RP Workstation: [id number]   Result Date: 11/22/2023  EXAM: CT CERVICAL SPINE WITHOUT CONTRAST 11/22/2023 01:07:00 PM TECHNIQUE: CT of the cervical spine was performed without the administration of intravenous contrast. Multiplanar reformatted images are provided for review. Automated exposure control, iterative reconstruction, and/or weight based adjustment of the mA/kV was utilized to reduce the radiation dose to as low as reasonably achievable. COMPARISON: CTA head and neck dated 08/16/2023. CLINICAL HISTORY: Polytrauma, blunt. Attempting to get in vehicle with caregiver. Mechanical fall while trying to get in vehicle. Hit head. Laceration to head. Complains of pain in the back of neck. On Xarelto . FINDINGS: CERVICAL SPINE: BONES AND ALIGNMENT: There is a displaced fracture through the base of the dens with approximately 12 mm posterior displacement relative to the body of C2. The dens remains articulated with the anterior arch of C1. There are additional mildly displaced fractures through the posterior arch of C1 along the right and left posterolateral aspects. There is likely an additional nondisplaced fracture through the midline anterior arch of C1. Lucency within the left lateral mass of C2 extending into the left articular pillar favored to reflect a nutrient vessel foramen baseline appearance on coronal images. Cervical lordosis is maintained. Similar anterolisthesis of C5 on C6 likely related to degenerative changes. There is no compression fracture in the cervical spine. DEGENERATIVE CHANGES: Facet arthrosis and uncovertebral hypertrophy at multiple levels in the cervical spine. SOFT TISSUES: Soft tissue surrounding the dens  appears increased from prior and may reflect component of hematoma. There is prevertebral soft tissue swelling extending from C1 to C3-4 possible hematoma and fracture displacement results in at least moderate spinal canal stenosis at C1-2. The paraspinal musculature is unremarkable. IMPRESSION: 1. Displaced type II odontoid fracture with approximately 12 mm posterior displacement relative to the body of C2. Associated prevertebral soft tissue swelling and possible epidural hematoma. There is at least  moderate spinal canal stenosis at C1-2. Recommend MRI cervical spine for further evaluation. 2. Additional mildly displaced fractures through the posterior arch of C1 and an additional nondisplaced fracture through the midline anterior arch of C1. 3. Similar anterolisthesis of C5 on C6 likely related to degenerative changes. Electronically signed by: Donnice Mania MD 11/22/2023 01:33 PM EDT RP Workstation: [id number]   CT HEAD WO CONTRAST Result Date: 11/22/2023 CLINICAL DATA:  Provided history: Head trauma, moderate/severe. Facial trauma, blunt. Mechanical fall. Head laceration. Neck pain. On Xarelto . EXAM: CT HEAD WITHOUT CONTRAST CT MAXILLOFACIAL WITHOUT CONTRAST TECHNIQUE: Multidetector CT imaging of the head and maxillofacial structures were performed using the standard protocol without intravenous contrast. Multiplanar CT image reconstructions of the maxillofacial structures were also generated. RADIATION DOSE REDUCTION: This exam was performed according to the departmental dose-optimization program which includes automated exposure control, adjustment of the mA and/or kV according to patient size and/or use of iterative reconstruction technique. COMPARISON:  Brain MRI 07/24/2023. CT angiogram head/neck 07/24/2023. FINDINGS: CT HEAD FINDINGS Brain: Generalized cerebral atrophy. 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 No demarcated cortical infarct.  No extra-axial fluid collection. No evidence of an intracranial mass. No midline shift. Vascular: No hyperdense vessel.  Atherosclerotic calcifications. Skull: No calvarial fracture or aggressive osseous lesion. Other: Frontoparietal scalp laceration and hematoma. CT MAXILLOFACIAL FINDINGS Osseous: No acute maxillofacial fracture is identified. Orbits: No acute orbital finding. Sinuses: No significant paranasal sinus disease. Soft tissues: No maxillofacial hematoma appreciable by CT. Other: Acute cervical spine fractures separately reported on same day cervical spine CT. IMPRESSION: CT head: 1.  No evidence of an acute intracranial abnormality. 2. Frontoparietal scalp laceration and hematoma. 3. Generalized cerebral atrophy. 4. 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 CT maxillofacial: 1. No evidence of an acute maxillofacial fracture. 2. Acute cervical spine fractures separately reported on same day cervical spine CT. Electronically Signed   By: Rockey Childs D.O.   On: 11/22/2023 13:39   CT MAXILLOFACIAL WO CONTRAST Result Date: 11/22/2023 CLINICAL DATA:  Provided history: Head trauma, moderate/severe. Facial trauma, blunt. Mechanical fall. Head laceration. Neck pain. On Xarelto . EXAM: CT HEAD WITHOUT CONTRAST CT MAXILLOFACIAL WITHOUT CONTRAST TECHNIQUE: Multidetector CT imaging of the head and maxillofacial structures were performed using the standard protocol without intravenous contrast. Multiplanar CT image reconstructions of the maxillofacial structures were also generated. RADIATION DOSE REDUCTION: This exam was performed according to the departmental dose-optimization program which includes automated exposure control, adjustment of the mA and/or kV according to patient size and/or use of iterative reconstruction technique. COMPARISON:  Brain MRI 07/24/2023. CT angiogram head/neck 07/24/2023. FINDINGS: CT HEAD FINDINGS Brain: Generalized cerebral atrophy. Known chronic infarcts within  the left cerebellar hemisphere There is no acute intracranial hemorrhage. No demarcated cortical infarct. No extra-axial fluid collection. No evidence of an intracranial mass. No midline shift. Vascular: No hyperdense vessel.  Atherosclerotic calcifications. Skull: No calvarial fracture or aggressive osseous lesion. Other: Frontoparietal scalp laceration and hematoma. CT MAXILLOFACIAL FINDINGS Osseous: No acute maxillofacial fracture is identified. Orbits: No acute orbital finding. Sinuses: No significant paranasal sinus disease. Soft tissues: No maxillofacial hematoma appreciable by CT. Other: Acute cervical spine fractures separately reported on same day cervical spine CT. IMPRESSION: CT head: 1.  No evidence of an acute intracranial abnormality. 2. Frontoparietal scalp laceration and hematoma. 3. Generalized cerebral atrophy. 4. 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 CT maxillofacial: 1. No evidence of an acute maxillofacial fracture. 2. Acute cervical spine  fractures separately reported on same day cervical spine CT. Electronically Signed   By: Rockey Childs D.O.   On: 11/22/2023 13:39   CT CHEST ABDOMEN PELVIS W CONTRAST Result Date: 11/22/2023 CLINICAL DATA:  Fall while trying to get into car. EXAM: CT CHEST, ABDOMEN, AND PELVIS WITH CONTRAST TECHNIQUE: Multidetector CT imaging of the chest, abdomen and pelvis was performed following the standard protocol during bolus administration of intravenous contrast. RADIATION DOSE REDUCTION: This exam was performed according to the departmental dose-optimization program which includes automated exposure control, adjustment of the mA and/or kV according to patient size and/or use of iterative reconstruction technique. CONTRAST:  75mL OMNIPAQUE  IOHEXOL  350 MG/ML SOLN COMPARISON:  April 24, 2022. 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  No dissection is noted. Mild cardiomegaly. No pericardial effusion. Coronary artery calcifications are noted. Mediastinum/Nodes: No enlarged mediastinal, hilar, or axillary lymph nodes. Thyroid gland, trachea, and esophagus demonstrate no significant findings. Lungs/Pleura: No pneumothorax or pleural effusion is noted. No acute pulmonary disease is noted. Musculoskeletal: 8.5 x 4.7 cm bilobed fat containing lesion is noted in left pectoral region which was present on prior exam, but now 13 mm solid nodule is noted. Lipoma or liposarcoma cannot be excluded. No definite osseous abnormality is noted. CT ABDOMEN PELVIS FINDINGS Hepatobiliary: No focal liver abnormality is seen. No gallstones, gallbladder wall thickening, or biliary dilatation. Pancreas: Unremarkable. No pancreatic ductal dilatation or surrounding inflammatory changes. Spleen: Normal in size without focal abnormality. Adrenals/Urinary Tract: Adrenal glands are unremarkable. Kidneys are normal, without renal calculi, focal lesion, or hydronephrosis. Bladder is unremarkable. Stomach/Bowel: Stomach is unremarkable. No evidence of bowel obstruction or inflammation. Status post appendectomy. Vascular/Lymphatic: Aortic atherosclerosis. No enlarged abdominal or pelvic lymph nodes. Reproductive: Status post prostatic brachytherapy seed placement. Other: No ascites or hernia is noted. Musculoskeletal: Probable old L2 compression fracture. No acute osseous abnormality is noted. 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 Recommend semi-annual imaging followup by CTA or MRA and referral to cardiothoracic surgery if not already obtained. This recommendation follows 2010 ACCF/AHA/AATS/ACR/ASA/SCA/SCAI/SIR/STS/SVM Guidelines for the Diagnosis and Management of Patients With Thoracic Aortic Disease. Circulation. 2010; 121: Z733-z630. Aortic aneurysm NOS (ICD10-I71.9). 8.5 x 4.7 cm bilobed fatty lesion  seen in left pectoral region which was present on prior exam of 2024, but now noted 13 mm nodule associated with this lesion. This may simply represent lipoma, but liposarcoma cannot be excluded. MRI may be performed for further evaluation. Status post prostatic brachytherapy seed placement. Probable old L2 compression fracture. Coronary artery calcifications are noted suggesting coronary artery disease. Aortic Atherosclerosis (ICD10-I70.0). Electronically Signed   By: Lynwood Landy Raddle M.D.   On: 11/22/2023 13:33   DG Chest Port 1 View Result Date: 11/22/2023 CLINICAL DATA:  Status post fall EXAM: PORTABLE CHEST 1 VIEW COMPARISON:  None Available. FINDINGS: Cardiomediastinal silhouette is enlarged. Aortic knob is calcified. Bilateral vascular congestion and increased interstitial markings of both lung fields. Atherosclerotic calcifications of the aortic annulus is suspected. Query left basilar atelectasis. No pleural effusion or pneumothorax. Right posterior sixth rib healed fracture. No acute fracture identified. Median sternotomy. IMPRESSION: Enlarged cardiomediastinal silhouette and bilateral vascular congestion. Chronic healed fracture of right sixth rib. No new fracture within the limitations of chest radiograph. Electronically Signed   By: Megan  Zare M.D.   On: 11/22/2023 12:58   DG Pelvis Portable Result Date: 11/22/2023 CLINICAL  DATA:  Fall. EXAM: PORTABLE PELVIS 1-2 VIEWS COMPARISON:  None Available. FINDINGS: There is no evidence of pelvic fracture or diastasis. No pelvic bone lesions are seen. IMPRESSION: Negative. Electronically Signed   By: Lynwood Landy Raddle M.D.   On: 11/22/2023 12:40       Harlene RAYMOND Bowl DO [facility] 11/27/2023, 1:13 PM  Available via Epic secure chat 7am-7pm After 7 pm, please refer to night coverage provider listed on amion.

## 2023-11-27 NOTE — Plan of Care (Signed)

## 2023-11-27 NOTE — Progress Notes (Deleted)
 Cardiology Office Note    Date:  11/27/2023  ID:  [name], DOB [birth year], MRN [id number] PCP:  [name]  Cardiologist:  Dorn Lesches, MD  Electrophysiologist:  None   Chief Complaint: ***  History of Present Illness: .    Zachary Underwood is a 89 y.o. male with visit-pertinent history of ***  Labwork independently reviewed:   ROS: .   *** denies chest pain, shortness of breath, lower extremity edema, fatigue, palpitations, melena, hematuria, hemoptysis, diaphoresis, weakness, presyncope, syncope, orthopnea, and PND.  All other systems are reviewed and otherwise negative.  Studies Reviewed: SABRA    EKG:  EKG is ordered today, personally reviewed, demonstrating ***     CV Studies: Cardiac studies reviewed are outlined and summarized above. Otherwise please see EMR for full report. Cardiac Studies & Procedures   ______________________________________________________________________________________________     ECHOCARDIOGRAM  ECHOCARDIOGRAM COMPLETE 11/25/2023  Narrative ECHOCARDIOGRAM REPORT    Patient Name:   Zachary Underwood Date of Exam: 11/25/2023 Medical Rec #:  [id number]       Height:       68.0 in Accession #:    [id number]      Weight:       186.0 lb Date of Birth:  1928-01-06       BSA:          1.982 m Patient Age:    89 years        BP:           123/55 mmHg Patient Gender: M               HR:           56 bpm. Exam Location:  Inpatient  Procedure: 2D Echo (Both Spectral and Color Flow Doppler were utilized during procedure).  Indications:    aortic stenosis  History:        Patient has prior history of Echocardiogram examinations, most recent 07/25/2023. CHF, Arrythmias:Atrial Fibrillation and Bradycardia, Signs/Symptoms:Edema; Risk Factors:Hypertension and Dyslipidemia.  Sonographer:    Tinnie Barefoot RDCS Referring Phys: [phone] TRACI R TURNER   Sonographer Comments: Technically difficult study due to poor echo windows. Patient  supine due to neck injury. IMPRESSIONS   1. Left ventricular ejection fraction, by estimation, is 70 to 75%. The left ventricle has hyperdynamic function. The left ventricle has no regional wall motion abnormalities. There is mild concentric left ventricular hypertrophy. Left ventricular diastolic function could not be evaluated. 2. Right ventricular systolic function is mildly reduced. The right ventricular size is normal. There is mildly elevated pulmonary artery systolic pressure. 3. Left atrial size was moderately dilated. 4. The mitral valve is degenerative. No evidence of mitral valve regurgitation. Mild mitral stenosis. Severe mitral annular calcification. 5. The aortic valve is normal in structure. There is severe calcifcation of the aortic valve. There is severe thickening of the aortic valve. Aortic valve regurgitation is trivial. Moderate aortic valve stenosis. 6. Aortic dilatation noted. There is moderate dilatation of the ascending aorta, measuring 47 mm. 7. The inferior vena cava is dilated in size with <50% respiratory variability, suggesting right atrial pressure of 15 mmHg.  FINDINGS Left Ventricle: Left ventricular ejection fraction, by estimation, is 70 to 75%. The left ventricle has hyperdynamic function. The left ventricle has no regional wall motion abnormalities. The left ventricular internal cavity size was small. There is mild concentric left ventricular hypertrophy. Left ventricular diastolic function could not be evaluated due to mitral annular calcification (moderate  or greater). Left ventricular diastolic function could not be evaluated.  Right Ventricle: The right ventricular size is normal. No increase in right ventricular wall thickness. Right ventricular systolic function is mildly reduced. There is mildly elevated pulmonary artery systolic pressure. The tricuspid regurgitant velocity is 2.34 m/s, and with an assumed right atrial pressure of 15 mmHg, the estimated  right ventricular systolic pressure is 36.9 mmHg.  Left Atrium: Left atrial size was moderately dilated.  Right Atrium: Right atrial size was normal in size.  Pericardium: There is no evidence of pericardial effusion.  Mitral Valve: The mitral valve is degenerative in appearance. Severe mitral annular calcification. No evidence of mitral valve regurgitation. Mild mitral valve stenosis. MV peak gradient, 19.1 mmHg. The mean mitral valve gradient is 9.0 mmHg.  Tricuspid Valve: The tricuspid valve is normal in structure. Tricuspid valve regurgitation is mild . No evidence of tricuspid stenosis.  Aortic Valve: The aortic valve is normal in structure. There is severe calcifcation of the aortic valve. There is severe thickening of the aortic valve. Aortic valve regurgitation is trivial. Moderate aortic stenosis is present. Aortic valve mean gradient measures 22.0 mmHg. Aortic valve peak gradient measures 44.1 mmHg. Aortic valve area, by VTI measures 1.63 cm.  Pulmonic Valve: The pulmonic valve was normal in structure. Pulmonic valve regurgitation is not visualized. No evidence of pulmonic stenosis.  Aorta: The aortic root is normal in size and structure and aortic dilatation noted. There is moderate dilatation of the ascending aorta, measuring 47 mm.  Venous: The inferior vena cava is dilated in size with less than 50% respiratory variability, suggesting right atrial pressure of 15 mmHg.  IAS/Shunts: No atrial level shunt detected by color flow Doppler.   LEFT VENTRICLE PLAX 2D LVIDd:         3.10 cm LVIDs:         1.80 cm LV PW:         1.40 cm LV IVS:        1.20 cm LVOT diam:     2.10 cm LV SV:         97 LV SV Index:   49 LVOT Area:     3.46 cm   RIGHT VENTRICLE            IVC RV Basal diam:  2.90 cm    IVC diam: 2.40 cm RV S prime:     9.25 cm/s TAPSE (M-mode): 0.9 cm  LEFT ATRIUM             Index        RIGHT ATRIUM           Index LA diam:        5.10 cm 2.57 cm/m   RA  Area:     19.10 cm LA Vol (A2C):   79.1 ml 39.92 ml/m  RA Volume:   49.70 ml  25.08 ml/m LA Vol (A4C):   76.6 ml 38.65 ml/m LA Biplane Vol: 78.1 ml 39.41 ml/m AORTIC VALVE AV Area (Vmax):    1.28 cm AV Area (Vmean):   1.50 cm AV Area (VTI):     1.63 cm AV Vmax:           332.00 cm/s AV Vmean:          186.600 cm/s AV VTI:            0.595 m AV Peak Grad:      44.1 mmHg AV Mean Grad:  22.0 mmHg LVOT Vmax:         122.67 cm/s LVOT Vmean:        81.033 cm/s LVOT VTI:          0.279 m LVOT/AV VTI ratio: 0.47  AORTA Ao Root diam: 3.80 cm Ao Asc diam:  4.60 cm  MITRAL VALVE              TRICUSPID VALVE MV Area VTI:  2.07 cm    TR Peak grad:   21.9 mmHg MV Peak grad: 19.1 mmHg   TR Vmax:        234.00 cm/s MV Mean grad: 9.0 mmHg MV Vmax:      2.18 m/s    SHUNTS MV Vmean:     104.4 cm/s  Systemic VTI:  0.28 m Systemic Diam: 2.10 cm  Morene Brownie Electronically signed by Morene Brownie Signature Date/Time: 11/25/2023/4:54:49 PM    Final    MONITORS  CARDIAC EVENT MONITOR 03/20/2023       ______________________________________________________________________________________________       Current Reported Medications:.    No outpatient medications have been marked as taking for the 11/29/23 encounter (Appointment) with Torsha Lemus D, NP.    Physical Exam:    VS:  There were no vitals taken for this visit.   Wt Readings from Last 3 Encounters:  11/26/23 199 lb 4.7 oz (90.4 kg)  11/06/23 195 lb 9.6 oz (88.7 kg)  07/24/23 185 lb (83.9 kg)    GEN: Well nourished, well developed in no acute distress NECK: No JVD; No carotid bruits CARDIAC: ***RRR, no murmurs, rubs, gallops RESPIRATORY:  Clear to auscultation without rales, wheezing or rhonchi  ABDOMEN: Soft, non-tender, non-distended EXTREMITIES:  No edema; No acute deformity     Asessement and Plan:.     ***     Disposition: F/u with ***  Signed, Teddy Rebstock D Choya Tornow, NP

## 2023-11-27 NOTE — Progress Notes (Signed)
 Speech Language Pathology Treatment: Dysphagia  Patient Details Name: Zachary Underwood MRN: [id number] DOB: 1927/12/13 Today's Date: 11/27/2023 Time: 8552-8481 SLP Time Calculation (min) (ACUTE ONLY): 31 min  Assessment / Plan / Recommendation Clinical Impression  Pt was seated in recliner; his spouse was at the bedside. Reviewed results of MBS. Discussed decompensation, generalized weakness of the swallow, and decreased functional reserve as contributors to current dysphagia.  Demonstrated thickening of liquid to nectar and its value as a temporary measure to limit aspiration. Pt/spouse verbalized understanding. Pt self-fed thickened liquids with no overt s/s of aspiration. Continues with persisting secretions that are difficult to mobilize/expectorate. SLP will follow.   HPI HPI: Zachary Underwood is an active, still-working 89 y.o. male who presented to the ED for evaluation after he fell and hit his head and neck. CT head and maxillofacial negative for acute changes. CT cervical spine: mildly displaced fractures through the posterior arch of C1 and nondisplaced fracture through the midline anterior arch of C1. MR cervical spine: Displaced type 2 odontoid fracture with significant posterior dislocation of the dens relative to the body of C2, osteophyte at C5-6. MBS 11/25/23 -dysphagia 3, nectar liquids with precautions recommended. PMH: persistent atrial fibrillation on Xarelto , CVA, moderate aortic stenosis, s/p surgical repair of ascending thoracic aorta, HTN, HLD, BPH, chronic lower extremity edema.      SLP Plan  Continue with current plan of care          Recommendations  Diet recommendations: Dysphagia 3 (mechanical soft);Nectar-thick liquid Liquids provided via: Cup;Straw Medication Administration: Crushed with puree Supervision: Staff to assist with self feeding (or family to assist with self-feeding as necessary) Compensations: Slow rate;Small sips/bites;Effortful swallow;Multiple dry  swallows after each bite/sip Postural Changes and/or Swallow Maneuvers: Seated upright 90 degrees;Upright 30-60 min after meal Cues- mod I                  Oral care BID     Dysphagia, oropharyngeal phase (R13.12)     Continue with current plan of care    Zachary Underwood L. Vona, MA CCC/SLP Clinical Specialist - Acute Care SLP Acute Rehabilitation Services Office number [phone]  Zachary Underwood  11/27/2023, 3:26 PM

## 2023-11-27 NOTE — TOC Initial Note (Signed)
 Transition of Care (TOC) - Initial/Assessment Note  Rayfield Gobble RN,BSN Inpatient Care Management Unit 4NP (Non Trauma)- RN Case Manager See Treatment Team for direct Phone #   Patient Details  Name: Zachary Underwood MRN: [id number] Date of Birth: Jul 26, 1927  Transition of Care [facility]) CM/SW Contact:    Gobble Rayfield Hurst, RN Phone Number: 11/27/2023, 2:47 PM  Clinical Narrative:                 Pt admitted from home s/p fall w/ cervical fracture.  Per chart review pt has primary care with [facility].  Home w/ wife- has aide that comes through [facility] that is set up with the [facility].  Note request for DME-motorized w/c, ramp, and chair lift sent to the [facility] in April when pt was discharged. Pt has BSC, shower seat, RW, Rollator, wheelchair, and cane.   Per [facility] pt has HH hx with [facility] and [facility]. CM spoke with [facility] liaison and pt was recently active with services ending on October 23, 2023.   PT/OT have re-assess pt per wife request and updated recommendations to [facility]. [facility] liaison will follow up with pt/wife regarding possible admission.   Inpatient Care Management (ICM)  will continue to monitor patient advancement through interdisciplinary progression rounds. If new patient transition needs arise, please place a ICM (CM/CSW) consult.  Expected Discharge Plan: IP Rehab Facility Barriers to Discharge: Continued Medical Work up   Patient Goals and CMS Choice Patient states their goals for this hospitalization and ongoing recovery are:: to be able to return home   Choice offered to / list presented to : Patient, Spouse      Expected Discharge Plan and Services In-house Referral: Clinical Social Work Discharge Planning Services: CM Consult Post Acute Care Choice: IP Rehab Living arrangements for the past 2 months: Single Family Home                                      Prior Living Arrangements/Services Living arrangements for the past 2  months: Single Family Home Lives with:: Spouse Patient language and need for interpreter reviewed:: Yes        Need for Family Participation in Patient Care: Yes (Comment) Care giver support system in place?: Yes (comment) Current home services: DME (shower seat/ BSC/ cane/ Psychologist, educational) Criminal Activity/Legal Involvement Pertinent to Current Situation/Hospitalization: No - Comment as needed  Activities of Daily Living      Permission Sought/Granted                  Emotional Assessment Appearance:: Appears stated age     Orientation: : Oriented to Self, Oriented to Place, Oriented to  Time, Oriented to Situation Alcohol / Substance Use: Not Applicable Psych Involvement: No (comment)  Admission diagnosis:  Fall, initial encounter [W19.XXXA] Laceration of scalp, initial encounter [S01.01XA] Closed displaced fracture of second cervical vertebra, unspecified fracture morphology, initial encounter (HCC) [S12.100A] Dens fracture (HCC) [S12.110A] Patient Active Problem List   Diagnosis Date Noted   Bradycardia 11/23/2023   Chronic atrial fibrillation (HCC) 11/23/2023   Chronic diastolic CHF (congestive heart failure) (HCC) 11/23/2023   Coronary artery calcification seen on CAT scan 11/23/2023   Ascending aortic aneurysm (HCC) 11/23/2023   Closed displaced fracture of second cervical vertebra (HCC) 11/22/2023   History of CVA (cerebrovascular accident) 11/22/2023   AKI (acute kidney injury) (HCC) 11/22/2023   Normocytic  anemia 11/22/2023   Lower extremity edema 11/06/2023   TIA (transient ischemic attack) 07/24/2023   Syncope and collapse 01/28/2023   Benign enlargement of prostate 07/30/2022   Bilateral hearing loss 07/30/2022   Other abnormalities of gait and mobility 07/30/2022   Other specified problems related to psychosocial circumstances 07/30/2022   Sensorineural hearing loss, bilateral 07/30/2022   Tinnitus, bilateral 07/30/2022   Impingement syndrome of  left shoulder region 06/03/2022   Mild cognitive impairment of uncertain or unknown etiology 03/27/2022   Snores 03/27/2022   Encounter for examination for normal comparison and control in clinical research program 03/27/2022   Hyperlipidemia 02/28/2022   Primary hypertension 02/28/2022   Nonrheumatic aortic valve stenosis 02/28/2022   Stroke (HCC) 08/23/2021   Atherosclerosis of aorta (HCC) 08/23/2021   Persistent atrial fibrillation [facility])    PCP:  [name] Pharmacy:   [facility] - [place], [place] - [address] [place] [address] Phone: [phone] Fax: [phone]     Social Drivers of Health (SDOH) Social History: SDOH Screenings   Food Insecurity: No Food Insecurity (11/23/2023)  Housing: Unknown (11/23/2023)  Transportation Needs: No Transportation Needs (11/23/2023)  Utilities: Not At Risk (11/23/2023)  Depression (PHQ2-9): Low Risk  (09/04/2021)  Financial Resource Strain: Low Risk  (09/04/2021)  Social Connections: Unknown (11/23/2023)  Stress: No Stress Concern Present (09/04/2021)  Tobacco Use: Medium Risk (11/22/2023)   SDOH Interventions:     Readmission Risk Interventions     No data to display

## 2023-11-27 NOTE — Progress Notes (Signed)
 Occupational Therapy treatment  Patient Details Name: Zachary Underwood MRN: [id number] DOB: May 21, 1927 Today's Date: 11/27/2023   History of Present Illness   Pt is a 89 y.o. male presenting 8/1 with head/neck pain after a fall at home. MRI C spine with displaced type 2 odontoid fracture with significant posterior dislocation of the dens relative to the body of C2. PMH P A-fib, CVA, moderate aortic stenosis, s/p surgical repair of ascending thoracic aorta, HTN, HLD, BPH, chronic lower extremity edema     Clinical Impressions Pt progressing toward established OT goals. Good recall of log roll technique to maintain cervical precautions but up to mod cues for functional implementation. Challenging return to ADL and functional activity tolerance this session. Pt needing up to mod A for LB ADL and min A for grooming tasks at sink with continued education to perform within precautions. Pt then ambulatory in hall with up to min A for balance and one standing rest break. Updated discharge recommendation to inpatient rehab >3 hours/day to optimize safety and independence in ADL and IADL due to pt with continued weakness and presenting below functional baseline.      If plan is discharge home, recommend the following:   A little help with walking and/or transfers;A little help with bathing/dressing/bathroom;Assistance with cooking/housework;Assist for transportation;Help with stairs or ramp for entrance     Functional Status Assessment         Equipment Recommendations   None recommended by OT     Recommendations for Other Services   Speech consult     Precautions/Restrictions   Precautions Precautions: Fall;Cervical Precaution Booklet Issued: Yes (comment) Recall of Precautions/Restrictions: Impaired Precaution/Restrictions Comments: precautions reviewed within the context of ADL/mobility; will benefit from further/cont education Required Braces or Orthoses: Cervical  Brace Cervical Brace: Hard collar;At all times Restrictions Weight Bearing Restrictions Per Provider Order: No     Mobility Bed Mobility Overal bed mobility: Needs Assistance Bed Mobility: Rolling, Sit to Sidelying Rolling: Mod assist, Used rails Sidelying to sit: Mod assist       General bed mobility comments: able to recall technique; hands on cueing and physical assist to elevate trunk    Transfers Overall transfer level: Needs assistance Equipment used: Rolling walker (2 wheels) Transfers: Sit to/from Stand Sit to Stand: Min assist                  Balance Overall balance assessment: Needs assistance Sitting-balance support: No upper extremity supported, Feet supported Sitting balance-Leahy Scale: Good     Standing balance support: Bilateral upper extremity supported, During functional activity Standing balance-Leahy Scale: Fair Standing balance comment: reliant on RW                           ADL either performed or assessed with clinical judgement   ADL Overall ADL's : Needs assistance/impaired     Grooming: Minimal assistance;Standing Grooming Details (indicate cue type and reason): intermittent A for balance, cues for compensatory technique to maintain spinal precations             Lower Body Dressing: Moderate assistance;Sit to/from stand Lower Body Dressing Details (indicate cue type and reason): pt continues to attempt to bend to BLE; able to pull up but not don sock within precautions Toilet Transfer: Contact guard assist;Ambulation;Rolling walker (2 wheels);Minimal assistance Toilet Transfer Details (indicate cue type and reason): 1x assts for balance         Functional mobility during ADLs: Contact  guard assist;Rolling walker (2 wheels);Cueing for safety       Vision         Perception         Praxis         Pertinent Vitals/Pain Pain Assessment Pain Assessment: Faces Faces Pain Scale: Hurts little more Pain  Location: neck with rolling onto side during bed mobility Pain Descriptors / Indicators: Discomfort, Grimacing Pain Intervention(s): Limited activity within patient's tolerance, Monitored during session     Extremity/Trunk Assessment Upper Extremity Assessment Upper Extremity Assessment: Generalized weakness   Lower Extremity Assessment Lower Extremity Assessment: Defer to PT evaluation       Communication Communication Communication: Impaired Factors Affecting Communication: Hearing impaired (slurred speech at times)   Cognition Arousal: Alert Behavior During Therapy: WFL for tasks assessed/performed Cognition: No family/caregiver present to determine baseline             OT - Cognition Comments: cognition appears intact; some cues for recall of spinal precautions                 Following commands: Intact       Cueing  General Comments   Cueing Techniques: Verbal cues         OT Goals(Current goals can be found in the care plan section)   Acute Rehab OT Goals Patient Stated Goal: get better OT Goal Formulation: With patient Time For Goal Achievement: 12/08/23 Potential to Achieve Goals: Good ADL Goals Additional ADL Goal #1: pt will perofrm BADL within precautions with supervision.   OT Frequency:  Min 2X/week    Co-evaluation              AM-PAC OT 6 Clicks Daily Activity     Outcome Measure Help from another person eating meals?: None Help from another person taking care of personal grooming?: A Little Help from another person toileting, which includes using toliet, bedpan, or urinal?: A Little Help from another person bathing (including washing, rinsing, drying)?: A Lot Help from another person to put on and taking off regular upper body clothing?: A Little Help from another person to put on and taking off regular lower body clothing?: A Lot 6 Click Score: 17   End of Session Equipment Utilized During Treatment: Gait belt;Rolling  walker (2 wheels);Cervical collar Nurse Communication: Mobility status  Activity Tolerance: Patient tolerated treatment well Patient left: with call bell/phone within reach;in chair;with chair alarm set  OT Visit Diagnosis: Unsteadiness on feet (R26.81);Muscle weakness (generalized) (M62.81);Pain                Time: 8969-8886 OT Time Calculation (min): 43 min Charges:  OT General Charges $OT Visit: 1 Visit OT Treatments $Self Care/Home Management : 38-52 mins  Elma JONETTA Lebron FREDERICK, OTR/L [facility] Acute Rehabilitation Office: [phone]   Elma JONETTA Lebron 11/27/2023, 1:41 PM

## 2023-11-28 ENCOUNTER — Other Ambulatory Visit: Payer: Self-pay

## 2023-11-28 ENCOUNTER — Inpatient Hospital Stay (HOSPITAL_COMMUNITY)

## 2023-11-28 ENCOUNTER — Inpatient Hospital Stay (HOSPITAL_COMMUNITY)
Admission: AD | Admit: 2023-11-28 | Discharge: 2023-12-11 | DRG: 560 | Disposition: A | Discharge status: Home Health | Source: Intra-hospital | Attending: Physical Medicine and Rehabilitation | Admitting: Physical Medicine and Rehabilitation

## 2023-11-28 ENCOUNTER — Encounter (HOSPITAL_COMMUNITY): Payer: Self-pay | Admitting: Physical Medicine and Rehabilitation

## 2023-11-28 DIAGNOSIS — S0101XD Laceration without foreign body of scalp, subsequent encounter: Secondary | ICD-10-CM

## 2023-11-28 DIAGNOSIS — I350 Nonrheumatic aortic (valve) stenosis: Secondary | ICD-10-CM | POA: Diagnosis present

## 2023-11-28 DIAGNOSIS — M546 Pain in thoracic spine: Secondary | ICD-10-CM | POA: Diagnosis not present

## 2023-11-28 DIAGNOSIS — I700 Atherosclerosis of aorta: Secondary | ICD-10-CM | POA: Diagnosis present

## 2023-11-28 DIAGNOSIS — M25562 Pain in left knee: Secondary | ICD-10-CM | POA: Diagnosis present

## 2023-11-28 DIAGNOSIS — E785 Hyperlipidemia, unspecified: Secondary | ICD-10-CM | POA: Diagnosis present

## 2023-11-28 DIAGNOSIS — I4821 Permanent atrial fibrillation: Secondary | ICD-10-CM | POA: Diagnosis present

## 2023-11-28 DIAGNOSIS — N400 Enlarged prostate without lower urinary tract symptoms: Secondary | ICD-10-CM | POA: Diagnosis present

## 2023-11-28 DIAGNOSIS — N189 Chronic kidney disease, unspecified: Secondary | ICD-10-CM | POA: Diagnosis present

## 2023-11-28 DIAGNOSIS — Z7901 Long term (current) use of anticoagulants: Secondary | ICD-10-CM | POA: Diagnosis not present

## 2023-11-28 DIAGNOSIS — E870 Hyperosmolality and hypernatremia: Secondary | ICD-10-CM | POA: Diagnosis present

## 2023-11-28 DIAGNOSIS — S12100S Unspecified displaced fracture of second cervical vertebra, sequela: Secondary | ICD-10-CM | POA: Diagnosis not present

## 2023-11-28 DIAGNOSIS — I959 Hypotension, unspecified: Secondary | ICD-10-CM | POA: Diagnosis not present

## 2023-11-28 DIAGNOSIS — D649 Anemia, unspecified: Secondary | ICD-10-CM | POA: Diagnosis not present

## 2023-11-28 DIAGNOSIS — Z886 Allergy status to analgesic agent status: Secondary | ICD-10-CM | POA: Diagnosis not present

## 2023-11-28 DIAGNOSIS — R600 Localized edema: Secondary | ICD-10-CM | POA: Diagnosis present

## 2023-11-28 DIAGNOSIS — S12111D Posterior displaced Type II dens fracture, subsequent encounter for fracture with routine healing: Secondary | ICD-10-CM | POA: Diagnosis present

## 2023-11-28 DIAGNOSIS — S12190D Other displaced fracture of second cervical vertebra, subsequent encounter for fracture with routine healing: Secondary | ICD-10-CM

## 2023-11-28 DIAGNOSIS — R0602 Shortness of breath: Secondary | ICD-10-CM | POA: Diagnosis not present

## 2023-11-28 DIAGNOSIS — I4819 Other persistent atrial fibrillation: Secondary | ICD-10-CM | POA: Diagnosis present

## 2023-11-28 DIAGNOSIS — R5381 Other malaise: Secondary | ICD-10-CM | POA: Diagnosis present

## 2023-11-28 DIAGNOSIS — Z8679 Personal history of other diseases of the circulatory system: Secondary | ICD-10-CM

## 2023-11-28 DIAGNOSIS — F419 Anxiety disorder, unspecified: Secondary | ICD-10-CM | POA: Diagnosis not present

## 2023-11-28 DIAGNOSIS — N179 Acute kidney failure, unspecified: Secondary | ICD-10-CM | POA: Diagnosis present

## 2023-11-28 DIAGNOSIS — S129XXA Fracture of neck, unspecified, initial encounter: Principal | ICD-10-CM | POA: Diagnosis present

## 2023-11-28 DIAGNOSIS — R1310 Dysphagia, unspecified: Secondary | ICD-10-CM | POA: Diagnosis present

## 2023-11-28 DIAGNOSIS — T7840XD Allergy, unspecified, subsequent encounter: Secondary | ICD-10-CM | POA: Diagnosis not present

## 2023-11-28 DIAGNOSIS — Z87891 Personal history of nicotine dependence: Secondary | ICD-10-CM

## 2023-11-28 DIAGNOSIS — D1779 Benign lipomatous neoplasm of other sites: Secondary | ICD-10-CM | POA: Diagnosis present

## 2023-11-28 DIAGNOSIS — T380X5A Adverse effect of glucocorticoids and synthetic analogues, initial encounter: Secondary | ICD-10-CM | POA: Diagnosis present

## 2023-11-28 DIAGNOSIS — S12100A Unspecified displaced fracture of second cervical vertebra, initial encounter for closed fracture: Secondary | ICD-10-CM

## 2023-11-28 DIAGNOSIS — M25511 Pain in right shoulder: Secondary | ICD-10-CM | POA: Diagnosis present

## 2023-11-28 DIAGNOSIS — R471 Dysarthria and anarthria: Secondary | ICD-10-CM | POA: Diagnosis present

## 2023-11-28 DIAGNOSIS — W010XXD Fall on same level from slipping, tripping and stumbling without subsequent striking against object, subsequent encounter: Secondary | ICD-10-CM | POA: Diagnosis present

## 2023-11-28 DIAGNOSIS — R0981 Nasal congestion: Secondary | ICD-10-CM | POA: Diagnosis present

## 2023-11-28 DIAGNOSIS — S12000D Unspecified displaced fracture of first cervical vertebra, subsequent encounter for fracture with routine healing: Secondary | ICD-10-CM

## 2023-11-28 DIAGNOSIS — Z888 Allergy status to other drugs, medicaments and biological substances status: Secondary | ICD-10-CM | POA: Diagnosis not present

## 2023-11-28 DIAGNOSIS — R001 Bradycardia, unspecified: Secondary | ICD-10-CM | POA: Diagnosis not present

## 2023-11-28 DIAGNOSIS — I482 Chronic atrial fibrillation, unspecified: Secondary | ICD-10-CM | POA: Diagnosis present

## 2023-11-28 DIAGNOSIS — E669 Obesity, unspecified: Secondary | ICD-10-CM | POA: Diagnosis present

## 2023-11-28 DIAGNOSIS — I5032 Chronic diastolic (congestive) heart failure: Secondary | ICD-10-CM | POA: Diagnosis present

## 2023-11-28 DIAGNOSIS — R5383 Other fatigue: Secondary | ICD-10-CM | POA: Diagnosis present

## 2023-11-28 DIAGNOSIS — D631 Anemia in chronic kidney disease: Secondary | ICD-10-CM | POA: Diagnosis present

## 2023-11-28 DIAGNOSIS — I4892 Unspecified atrial flutter: Secondary | ICD-10-CM | POA: Diagnosis not present

## 2023-11-28 DIAGNOSIS — Z7409 Other reduced mobility: Secondary | ICD-10-CM | POA: Diagnosis present

## 2023-11-28 DIAGNOSIS — Z8673 Personal history of transient ischemic attack (TIA), and cerebral infarction without residual deficits: Secondary | ICD-10-CM

## 2023-11-28 DIAGNOSIS — I130 Hypertensive heart and chronic kidney disease with heart failure and stage 1 through stage 4 chronic kidney disease, or unspecified chronic kidney disease: Secondary | ICD-10-CM | POA: Diagnosis present

## 2023-11-28 DIAGNOSIS — J811 Chronic pulmonary edema: Secondary | ICD-10-CM | POA: Diagnosis present

## 2023-11-28 DIAGNOSIS — R519 Headache, unspecified: Secondary | ICD-10-CM | POA: Diagnosis not present

## 2023-11-28 DIAGNOSIS — I10 Essential (primary) hypertension: Secondary | ICD-10-CM | POA: Diagnosis not present

## 2023-11-28 DIAGNOSIS — D6869 Other thrombophilia: Secondary | ICD-10-CM | POA: Diagnosis present

## 2023-11-28 DIAGNOSIS — S12110S Anterior displaced Type II dens fracture, sequela: Secondary | ICD-10-CM | POA: Diagnosis not present

## 2023-11-28 DIAGNOSIS — L299 Pruritus, unspecified: Secondary | ICD-10-CM | POA: Diagnosis not present

## 2023-11-28 DIAGNOSIS — H9190 Unspecified hearing loss, unspecified ear: Secondary | ICD-10-CM | POA: Diagnosis present

## 2023-11-28 DIAGNOSIS — M810 Age-related osteoporosis without current pathological fracture: Secondary | ICD-10-CM | POA: Diagnosis present

## 2023-11-28 DIAGNOSIS — G3184 Mild cognitive impairment, so stated: Secondary | ICD-10-CM

## 2023-11-28 DIAGNOSIS — Z6830 Body mass index (BMI) 30.0-30.9, adult: Secondary | ICD-10-CM

## 2023-11-28 DIAGNOSIS — F411 Generalized anxiety disorder: Secondary | ICD-10-CM

## 2023-11-28 DIAGNOSIS — S22029D Unspecified fracture of second thoracic vertebra, subsequent encounter for fracture with routine healing: Secondary | ICD-10-CM

## 2023-11-28 DIAGNOSIS — W19XXXA Unspecified fall, initial encounter: Secondary | ICD-10-CM | POA: Diagnosis not present

## 2023-11-28 DIAGNOSIS — Z79899 Other long term (current) drug therapy: Secondary | ICD-10-CM

## 2023-11-28 LAB — BASIC METABOLIC PANEL WITH GFR
Anion gap: 11 (ref 5–15)
BUN: 42 mg/dL — ABNORMAL HIGH (ref 8–23)
CO2: 23 mmol/L (ref 22–32)
Calcium: 9.5 mg/dL (ref 8.9–10.3)
Chloride: 107 mmol/L (ref 98–111)
Creatinine, Ser: 1.52 mg/dL — ABNORMAL HIGH (ref 0.61–1.24)
GFR, Estimated: 42 mL/min — ABNORMAL LOW (ref >60)
Glucose, Bld: 114 mg/dL — ABNORMAL HIGH (ref 70–99)
Potassium: 4 mmol/L (ref 3.5–5.1)
Sodium: 141 mmol/L (ref 135–145)

## 2023-11-28 LAB — CBC
HCT: 33.2 % — ABNORMAL LOW (ref 39.0–52.0)
Hemoglobin: 10.7 g/dL — ABNORMAL LOW (ref 13.0–17.0)
MCH: 30.4 pg (ref 26.0–34.0)
MCHC: 32.2 g/dL (ref 30.0–36.0)
MCV: 94.3 fL (ref 80.0–100.0)
Platelets: 177 K/uL (ref 150–400)
RBC: 3.52 MIL/uL — ABNORMAL LOW (ref 4.22–5.81)
RDW: 14 % (ref 11.5–15.5)
WBC: 7.2 K/uL (ref 4.0–10.5)
nRBC: 0 % (ref 0.0–0.2)

## 2023-11-28 MED ORDER — LIDOCAINE 5 % EX PTCH: 1.0000 | MEDICATED_PATCH | CUTANEOUS | Status: DC

## 2023-11-28 MED ORDER — OXYCODONE HCL 5 MG PO TABS: 2.5000 mg | ORAL_TABLET | ORAL | Status: DC | PRN

## 2023-11-28 MED ORDER — PROCHLORPERAZINE EDISYLATE 10 MG/2ML IJ SOLN: 5.0000 mg | Freq: Four times a day (QID) | INTRAMUSCULAR | Status: DC | PRN

## 2023-11-28 MED ORDER — ALUM & MAG HYDROXIDE-SIMETH 200-200-20 MG/5ML PO SUSP: 30.0000 mL | ORAL | Status: DC | PRN

## 2023-11-28 MED ORDER — FLUTICASONE PROPIONATE 50 MCG/ACT NA SUSP
2.0000 | Freq: Every day | NASAL | Status: DC
  Administered 2023-11-29 – 2023-12-11 (×15): 2 via NASAL
  Filled 2023-11-28: qty 16

## 2023-11-28 MED ORDER — METOLAZONE 2.5 MG PO TABS
2.5000 mg | ORAL_TABLET | Freq: Every day | ORAL | Status: DC
Start: 2023-11-28 — End: 2023-11-28
  Filled 2023-11-28: qty 1

## 2023-11-28 MED ORDER — PANTOPRAZOLE SODIUM 40 MG PO TBEC: 40.0000 mg | DELAYED_RELEASE_TABLET | Freq: Two times a day (BID) | ORAL | Status: DC

## 2023-11-28 MED ORDER — ENSURE PLUS HIGH PROTEIN PO LIQD
237.0000 mL | Freq: Two times a day (BID) | ORAL | Status: DC
  Administered 2023-11-29 – 2023-12-11 (×18): 237 mL via ORAL

## 2023-11-28 MED ORDER — SALINE SPRAY 0.65 % NA SOLN: 1.0000 | NASAL | Status: AC | PRN

## 2023-11-28 MED ORDER — TORSEMIDE 20 MG PO TABS: 20.0000 mg | ORAL_TABLET | Freq: Every day | ORAL | Status: DC | PRN

## 2023-11-28 MED ORDER — POLYETHYLENE GLYCOL 3350 17 G PO PACK: 17.0000 g | PACK | Freq: Every day | ORAL | Status: DC

## 2023-11-28 MED ORDER — FLEET ENEMA RE ENEM: 1.0000 | ENEMA | Freq: Once | RECTAL | Status: DC | PRN

## 2023-11-28 MED ORDER — CELECOXIB 100 MG PO CAPS
200.0000 mg | ORAL_CAPSULE | Freq: Two times a day (BID) | ORAL | Status: DC
  Administered 2023-11-28 – 2023-11-30 (×4): 200 mg via ORAL
  Filled 2023-11-28 (×4): qty 2

## 2023-11-28 MED ORDER — ENSURE PLUS HIGH PROTEIN PO LIQD: 237.0000 mL | Freq: Two times a day (BID) | ORAL | Status: AC

## 2023-11-28 MED ORDER — SENNOSIDES-DOCUSATE SODIUM 8.6-50 MG PO TABS
1.0000 | ORAL_TABLET | Freq: Two times a day (BID) | ORAL | Status: DC
  Administered 2023-11-28 – 2023-12-08 (×17): 1 via ORAL
  Filled 2023-11-28 (×18): qty 1

## 2023-11-28 MED ORDER — IRBESARTAN 75 MG PO TABS
37.5000 mg | ORAL_TABLET | Freq: Every day | ORAL | Status: DC
  Administered 2023-11-29 – 2023-12-11 (×16): 37.5 mg via ORAL
  Filled 2023-11-28 (×13): qty 1

## 2023-11-28 MED ORDER — METHOCARBAMOL 500 MG PO TABS: 500.0000 mg | ORAL_TABLET | Freq: Three times a day (TID) | ORAL | Status: DC

## 2023-11-28 MED ORDER — PROCHLORPERAZINE 25 MG RE SUPP: 12.5000 mg | Freq: Four times a day (QID) | RECTAL | Status: DC | PRN

## 2023-11-28 MED ORDER — LACTULOSE 10 GM/15ML PO SOLN: 20.0000 g | Freq: Once | ORAL | Status: DC

## 2023-11-28 MED ORDER — MELATONIN 5 MG PO TABS
5.0000 mg | ORAL_TABLET | Freq: Every evening | ORAL | Status: DC | PRN
Start: 2023-11-28 — End: 2023-11-29

## 2023-11-28 MED ORDER — GUAIFENESIN ER 600 MG PO TB12
600.0000 mg | ORAL_TABLET | Freq: Two times a day (BID) | ORAL | Status: DC
  Administered 2023-11-28 – 2023-12-11 (×32): 600 mg via ORAL
  Filled 2023-11-28 (×26): qty 1

## 2023-11-28 MED ORDER — LORATADINE 10 MG PO TABS: 10.0000 mg | ORAL_TABLET | Freq: Every day | ORAL | Status: DC

## 2023-11-28 MED ORDER — FLUTICASONE PROPIONATE 50 MCG/ACT NA SUSP: 2.0000 | Freq: Every day | NASAL | Status: DC

## 2023-11-28 MED ORDER — SENNOSIDES-DOCUSATE SODIUM 8.6-50 MG PO TABS: 1.0000 | ORAL_TABLET | Freq: Two times a day (BID) | ORAL | Status: DC

## 2023-11-28 MED ORDER — TAMSULOSIN HCL 0.4 MG PO CAPS
0.4000 mg | ORAL_CAPSULE | Freq: Every day | ORAL | Status: DC
  Administered 2023-11-28 – 2023-12-10 (×16): 0.4 mg via ORAL
  Filled 2023-11-28 (×14): qty 1

## 2023-11-28 MED ORDER — PROCHLORPERAZINE MALEATE 5 MG PO TABS: 5.0000 mg | ORAL_TABLET | Freq: Four times a day (QID) | ORAL | Status: DC | PRN

## 2023-11-28 MED ORDER — POLYETHYLENE GLYCOL 3350 17 G PO PACK
17.0000 g | PACK | Freq: Every day | ORAL | Status: DC
  Administered 2023-11-30 – 2023-12-06 (×8): 17 g via ORAL
  Filled 2023-11-28 (×10): qty 1

## 2023-11-28 MED ORDER — LIDOCAINE 5 % EX PTCH
1.0000 | MEDICATED_PATCH | CUTANEOUS | Status: DC
  Administered 2023-11-29 – 2023-12-10 (×12): 1 via TRANSDERMAL
  Filled 2023-11-28 (×11): qty 1

## 2023-11-28 MED ORDER — AMOXICILLIN-POT CLAVULANATE 875-125 MG PO TABS: 1.0000 | ORAL_TABLET | Freq: Two times a day (BID) | ORAL | Status: DC

## 2023-11-28 MED ORDER — METOLAZONE 2.5 MG PO TABS: 2.5000 mg | ORAL_TABLET | Freq: Every day | ORAL | Status: DC | PRN

## 2023-11-28 MED ORDER — GUAIFENESIN-DM 100-10 MG/5ML PO SYRP: 5.0000 mL | ORAL_SOLUTION | Freq: Four times a day (QID) | ORAL | Status: DC | PRN

## 2023-11-28 MED ORDER — GUAIFENESIN ER 600 MG PO TB12
600.0000 mg | ORAL_TABLET | Freq: Two times a day (BID) | ORAL | Status: DC
  Administered 2023-11-28: 600 mg via ORAL
  Filled 2023-11-28: qty 1

## 2023-11-28 MED ORDER — DIPHENHYDRAMINE HCL 25 MG PO CAPS: 25.0000 mg | ORAL_CAPSULE | Freq: Four times a day (QID) | ORAL | Status: DC | PRN

## 2023-11-28 MED ORDER — METHOCARBAMOL 1000 MG/10ML IJ SOLN
500.0000 mg | Freq: Three times a day (TID) | INTRAMUSCULAR | Status: DC
  Administered 2023-11-28 – 2023-11-29 (×2): 500 mg via INTRAVENOUS
  Filled 2023-11-28 (×2): qty 10

## 2023-11-28 MED ORDER — RIVAROXABAN 15 MG PO TABS
15.0000 mg | ORAL_TABLET | Freq: Every day | ORAL | Status: DC
  Administered 2023-11-28 – 2023-12-10 (×16): 15 mg via ORAL
  Filled 2023-11-28 (×13): qty 1

## 2023-11-28 MED ORDER — ATORVASTATIN CALCIUM 10 MG PO TABS
5.0000 mg | ORAL_TABLET | Freq: Every day | ORAL | Status: DC
  Administered 2023-11-28 – 2023-12-10 (×16): 5 mg via ORAL
  Filled 2023-11-28 (×13): qty 1

## 2023-11-28 MED ORDER — LORATADINE 10 MG PO TABS
10.0000 mg | ORAL_TABLET | Freq: Every day | ORAL | Status: DC
  Administered 2023-11-29: 10 mg via ORAL
  Filled 2023-11-28: qty 1

## 2023-11-28 MED ORDER — PANTOPRAZOLE SODIUM 40 MG PO TBEC
40.0000 mg | DELAYED_RELEASE_TABLET | Freq: Two times a day (BID) | ORAL | Status: DC
  Administered 2023-11-28 – 2023-12-11 (×32): 40 mg via ORAL
  Filled 2023-11-28 (×26): qty 1

## 2023-11-28 MED ORDER — AMOXICILLIN-POT CLAVULANATE 875-125 MG PO TABS
1.0000 | ORAL_TABLET | Freq: Two times a day (BID) | ORAL | Status: AC
  Administered 2023-11-28 – 2023-12-05 (×21): 1 via ORAL
  Filled 2023-11-28 (×15): qty 1

## 2023-11-28 MED ORDER — CELECOXIB 200 MG PO CAPS: 200.0000 mg | ORAL_CAPSULE | Freq: Two times a day (BID) | ORAL | Status: DC

## 2023-11-28 MED ORDER — BISACODYL 10 MG RE SUPP: 10.0000 mg | Freq: Every day | RECTAL | Status: DC | PRN

## 2023-11-28 MED ORDER — METOLAZONE 2.5 MG PO TABS
2.5000 mg | ORAL_TABLET | Freq: Every day | ORAL | Status: DC
  Administered 2023-11-29 – 2023-12-04 (×9): 2.5 mg via ORAL
  Filled 2023-11-28 (×6): qty 1

## 2023-11-28 MED ORDER — AMOXICILLIN-POT CLAVULANATE 875-125 MG PO TABS
1.0000 | ORAL_TABLET | Freq: Two times a day (BID) | ORAL | Status: DC
  Administered 2023-11-28: 1 via ORAL
  Filled 2023-11-28 (×2): qty 1

## 2023-11-28 NOTE — Progress Notes (Signed)
 PMR Admission Coordinator Pre-Admission Assessment   Patient: Zachary Underwood is an 89 y.o., male MRN: [id number] DOB: March 30, 1928 Height: 5' 8 (172.7 cm) Weight: 90.4 kg   Insurance Information HMO:     PPO:      PCP:      IPA:      80/20: yes     OTHER:  PRIMARY: Medicare Part A and B      Policy#: [id number]     Subscriber: pt Benefits:  Phone #: passport one source     Name:  Eff. Date active    Deduct: $1676      Out of Pocket Max: none      Life Max: none CIR: 100%      SNF: 20 dull days Outpatient: 80%     Co-Pay: 20% Home Health: 100%      Co-Pay:  DME: 80%     Co-Pay: 20% Providers: pt choice SECONDARY: [employer]      Policy#: [id number]      Phone#:    Financial Counselor:       Phone#:    The "Data Collection Information Summary" for patients in Inpatient Rehabilitation Facilities with attached "Privacy Act Statement-Health Care Records" was provided and verbally reviewed with: Patient and Family   Emergency Contact Information Contact Information       Name Relation Home Work Mobile    barbra,billauer,dr Spouse [phone]   [phone]         Other Contacts   None on File        Current Medical History  Patient Admitting Diagnosis: Fall with spinal fx History of Present Illness: Zachary Underwood is a 89 y.o. male with medical history significant for persistent atrial fibrillation on Xarelto , history of CVA, moderate aortic stenosis, s/p surgical repair of ascending thoracic aorta, HTN, HLD, BPH, chronic lower extremity edema who presented to the [facility] ED 11/22/2023 for evaluation after he fell and hit his head and neck. Initial vitals showed BP 137/58, pulse 107, RR 16, temp 97.3 F, SpO2 97% on room air.Labs showed WBC 7.0, hemoglobin 10.6, platelets 170, sodium 138, potassium 3.9, bicarb 24, BUN 27, creatinine 1.38, serum glucose 137, LFTs within normal limits, lactic acid 1.4.CT head with  No evidence of an acute intracranial abnormality, a  Frontoparietal scalp laceration and hematoma, Generalized cerebral atroph and  Known chronic infarcts within the left cerebellar hemisphere. CT maxillofacial: showed No evidence of an acute maxillofacial fracture but acute cervical spine fractures separately reported on same day cervical spine CT. CT of C spine with Displaced type II odontoid fracture with approximately 12 mm posterior displacement relative to the body of C2. Associated prevertebral soft tissue swelling and possible epidural hematoma. There is at least moderate spinal canal stenosis at C1-2. Recommend MRI cervical spine for further evaluation.  Additional mildly displaced fractures through the posterior arch of C1 and an additional nondisplaced fracture through the midline anterior arch of C1, also noted similar anterolisthesis of C5 on C6 likely related to degenerative changes. 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 Edema along the posterior atlantoaxial membrane suggestive of ligamentous injury. Possible disruption of the ligamentum flavum at C2-3 and C3-4.  Pt. In Cervical collar with conservative management recommended. Pt. With some dysarthria and swallowing deficits, seen by SLP and placed on dysphagia 3 diet with nectar thick liquids. PT/OT also saw and recommend CIR to assist return to PLOF.  CT chest/abdomen/pelvis with contrast Past Medical History      Past Medical History:  Diagnosis Date   Arthritis     Cardiac arrhythmia     Hypertension     Ruptured aortic aneurysm (HCC)     Stroke (HCC) 08/23/2021          Has the patient had major surgery during 100 days prior to admission? No   Family History   family history is not on file.   Current Medications  Current Medications    Current Facility-Administered Medications:    amoxicillin -clavulanate (AUGMENTIN ) 875-125 MG per tablet 1 tablet, 1 tablet, Oral, Q12H, Vann, Jessica U,  DO   atorvastatin  (LIPITOR) tablet 5 mg, 5 mg, Oral, QHS, Patel, Vishal R, MD, 5 mg at 11/27/23 2142   celecoxib  (CELEBREX ) capsule 200 mg, 200 mg, Oral, BID, Rai, Ripudeep K, MD, 200 mg at 11/28/23 0845   feeding supplement (ENSURE PLUS HIGH PROTEIN) liquid 237 mL, 237 mL, Oral, BID BM, Rai, Ripudeep K, MD, 237 mL at 11/28/23 9094   fluticasone  (FLONASE ) 50 MCG/ACT nasal spray 2 spray, 2 spray, Each Nare, Daily, Rai, Ripudeep K, MD, 2 spray at 11/28/23 0846   guaiFENesin  (MUCINEX ) 12 hr tablet 600 mg, 600 mg, Oral, BID, Vann, Jessica U, DO   hydrALAZINE  (APRESOLINE ) injection 10 mg, 10 mg, Intravenous, Q6H PRN, Rai, Ripudeep K, MD   HYDROmorphone  (DILAUDID ) injection 0.5 mg, 0.5 mg, Intravenous, Q3H PRN, Vann, Jessica U, DO   irbesartan  (AVAPRO ) tablet 37.5 mg, 37.5 mg, Oral, Daily, Rai, Ripudeep K, MD, 37.5 mg at 11/28/23 0845   lactulose  (CHRONULAC ) 10 GM/15ML solution 20 g, 20 g, Oral, Once, Rai, Ripudeep K, MD   lidocaine  (LIDODERM ) 5 % 1 patch, 1 patch, Transdermal, Q24H, Rai, Ripudeep K, MD   loratadine  (CLARITIN ) tablet 10 mg, 10 mg, Oral, Daily, Rai, Ripudeep K, MD, 10 mg at 11/28/23 0845   methocarbamol  (ROBAXIN ) injection 500 mg, 500 mg, Intravenous, Q8H, Rai, Ripudeep K, MD, 500 mg at 11/28/23 0531   metolazone  (ZAROXOLYN ) tablet 2.5 mg, 2.5 mg, Oral, Daily, Vann, Jessica U, DO   ondansetron  (ZOFRAN ) tablet 4 mg, 4 mg, Oral, Q6H PRN **OR** ondansetron  (ZOFRAN ) injection 4 mg, 4 mg, Intravenous, Q6H PRN, Tobie, Jorie SAUNDERS, MD   oxyCODONE  (Oxy IR/ROXICODONE ) immediate release tablet 2.5 mg, 2.5 mg, Oral, Q3H PRN, Vann, Jessica U, DO   pantoprazole  (PROTONIX ) EC tablet 40 mg, 40 mg, Oral, BID AC, Rai, Ripudeep K, MD, 40 mg at 11/28/23 0845   polyethylene glycol (MIRALAX  / GLYCOLAX ) packet 17 g, 17 g, Oral, Daily, Rai, Ripudeep K, MD, 17 g at 11/28/23 0845   Rivaroxaban  (XARELTO ) tablet 15 mg, 15 mg, Oral, Q supper, Dang, Thuy D, RPH, 15 mg at 11/27/23 1650   senna-docusate (Senokot-S)  tablet 1 tablet, 1 tablet, Oral, BID, Rai, Ripudeep K, MD, 1 tablet at 11/28/23 0845   sodium chloride  (OCEAN) 0.65 % nasal spray 1 spray, 1 spray, Each Nare, PRN, Rai, Ripudeep K, MD   sodium chloride  flush (NS) 0.9 % injection 3 mL, 3 mL, Intravenous, Q12H, Patel, Vishal R, MD, 3 mL at 11/28/23 9094   tamsulosin  (FLOMAX ) capsule 0.4 mg, 0.4 mg, Oral, QHS, Patel, Vishal R, MD, 0.4 mg at 11/27/23 2143     Patients Current Diet:  Diet Order                  DIET DYS 3 Room service appropriate? Yes with Assist; Fluid consistency: Nectar Thick  Diet effective  now                         Precautions / Restrictions Precautions Precautions: Fall, Cervical Precaution Booklet Issued: Yes (comment) Precaution/Restrictions Comments: precautions reviewed for UE lifting Cervical Brace: At all times, Hard collar Restrictions Weight Bearing Restrictions Per Provider Order: No    Has the patient had 2 or more falls or a fall with injury in the past year? Yes   Prior Activity Level Community (5-7x/wk): Pt. active in the communiity   Prior Functional Level Self Care: Did the patient need help bathing, dressing, using the toilet or eating? Independent   Indoor Mobility: Did the patient need assistance with walking from room to room (with or without device)? Independent   Stairs: Did the patient need assistance with internal or external stairs (with or without device)? Needed some help   Functional Cognition: Did the patient need help planning regular tasks such as shopping or remembering to take medications? Independent   Patient Information Are you of Hispanic, Latino/a,or Spanish origin?: A. No, not of Hispanic, Latino/a, or Spanish origin What is your race?: A. White Do you need or want an interpreter to communicate with a doctor or health care staff?: 0. No   Patient's Response To:  Health Literacy and Transportation Is the patient able to respond to health literacy and  transportation needs?: No Health Literacy - How often do you need to have someone help you when you read instructions, pamphlets, or other written material from your doctor or pharmacy?: Never In the past 12 months, has lack of transportation kept you from medical appointments or from getting medications?: No In the past 12 months, has lack of transportation kept you from meetings, work, or from getting things needed for daily living?: No   Home Assistive Devices / Equipment Home Equipment: Agricultural consultant (2 wheels), [employer] - quad, Information systems manager, BSC/3in1, Rollator (4 wheels)   Prior Device Use: Indicate devices/aids used by the patient prior to current illness, exacerbation or injury? Walker   Current Functional Level Cognition   Orientation Level: Oriented X4    Extremity Assessment (includes Sensation/Coordination)   Upper Extremity Assessment: Generalized weakness  Lower Extremity Assessment: Defer to PT evaluation     ADLs   Overall ADL's : Needs assistance/impaired Eating/Feeding: Set up, Sitting Grooming: Minimal assistance, Standing Grooming Details (indicate cue type and reason): intermittent A for balance, cues for compensatory technique to maintain spinal precations Upper Body Bathing: Minimal assistance, Sitting Lower Body Bathing: Moderate assistance, Sit to/from stand Upper Body Dressing : Minimal assistance, Sitting Lower Body Dressing: Moderate assistance, Sit to/from stand Lower Body Dressing Details (indicate cue type and reason): pt continues to attempt to bend to BLE; able to pull up but not don sock within precautions Toilet Transfer: Contact guard assist, Ambulation, Rolling walker (2 wheels), Minimal assistance Toilet Transfer Details (indicate cue type and reason): 1x assts for balance Toileting- Clothing Manipulation and Hygiene: Supervision/safety, Sitting/lateral lean Functional mobility during ADLs: Contact guard assist, Rolling walker (2 wheels), Cueing for  safety     Mobility   Overal bed mobility: Needs Assistance Bed Mobility: Rolling, Sit to Sidelying Rolling: Mod assist, Used rails Sidelying to sit: Mod assist Sit to sidelying: Mod assist General bed mobility comments: in recliner     Transfers   Overall transfer level: Needs assistance Equipment used: Rolling walker (2 wheels) Transfers: Sit to/from Stand Sit to Stand: Min assist General transfer comment: assist for balance  Ambulation / Gait / Stairs / Wheelchair Mobility   Ambulation/Gait Ambulation/Gait assistance: Contact guard assist, Min assist Gait Distance (Feet): 150 Feet Assistive device: Rolling walker (2 wheels) Gait Pattern/deviations: Step-through pattern, Decreased stride length, Shuffle, Decreased dorsiflexion - left, Decreased dorsiflexion - right General Gait Details: CGA to min A at times for balance with turns and for walker proximity Gait velocity: decreased Gait velocity interpretation: <1.31 ft/sec, indicative of household ambulator     Posture / Balance Dynamic Sitting Balance Sitting balance - Comments: heavy UE support on arms of chair to pull forward, can sit once upright with S Balance Overall balance assessment: Needs assistance Sitting-balance support: Feet supported Sitting balance-Leahy Scale: Good Sitting balance - Comments: heavy UE support on arms of chair to pull forward, can sit once upright with S Standing balance support: Bilateral upper extremity supported Standing balance-Leahy Scale: Poor Standing balance comment: UE support for balance and CGA     Special considerations/life events  Skin scalp lac and Special service needs c collar    Previous Home Environment (from acute therapy documentation) Living Arrangements: Spouse/significant other Available Help at Discharge: Family, Available 24 hours/day Type of Home: House Home Layout: Bed/bath upstairs, Multi-level Alternate Level Stairs-Number of Steps: chair lift to get  upstairs to bedroom Home Access: Stairs to enter Entrance Stairs-Rails: Left, Right, Can reach both Entrance Stairs-Number of Steps: 3 steps Bathroom Shower/Tub: Engineer, manufacturing systems: Standard Bathroom Accessibility: Yes How Accessible: Accessible via walker Additional Comments: aide drives pt to errands, exercises   Discharge Living Setting Plans for Discharge Living Setting: House Type of Home at Discharge: House Discharge Home Layout: Multi-level, Able to live on main level with bedroom/bathroom Alternate Level Stairs-Rails: Right, Left Alternate Level Stairs-Number of Steps: chair lift Discharge Home Access: Stairs to enter Entrance Stairs-Rails: Right, Left Entrance Stairs-Number of Steps: 3 Discharge Bathroom Shower/Tub: Walk-in shower Discharge Bathroom Toilet: Handicapped height Discharge Bathroom Accessibility: Yes How Accessible: Accessible via walker   Social/Family/Support Systems Patient Roles: Spouse Contact Information: [phone] Anticipated Caregiver: Dr [name] Hageman Anticipated Caregiver's Contact Information: Wife and caregiver 24/7 Caregiver Availability: 24/7 Discharge Plan Discussed with Primary Caregiver: Yes Is Caregiver In Agreement with Plan?: Yes Does Caregiver/Family have Issues with Lodging/Transportation while Pt is in Rehab?: No   Goals Patient/Family Goal for Rehab: PT/OT/SLP Mod I Expected length of stay: 5-7 days Pt/Family Agrees to Admission and willing to participate: Yes Program Orientation Provided & Reviewed with Pt/Caregiver Including Roles  & Responsibilities: Yes   Decrease burden of Care through IP rehab admission: Not anticipated   Possible need for SNF placement upon discharge: Not anticipated   Patient Condition: I have reviewed medical records from [facility] , spoken with CM, and patient. I met with patient at the bedside for inpatient rehabilitation assessment.  Patient will benefit from  ongoing PT, OT, and SLP, can actively participate in 3 hours of therapy a day 5 days of the week, and can make measurable gains during the admission.  Patient will also benefit from the coordinated team approach during an Inpatient Acute Rehabilitation admission.  The patient will receive intensive therapy as well as Rehabilitation physician, nursing, social worker, and care management interventions.  Due to safety, skin/wound care, disease management, medication administration, pain management, and patient education the patient requires 24 hour a day rehabilitation nursing.  The patient is currently CGA  with mobility and basic ADLs.  Discharge setting and therapy post discharge at home with home health is anticipated.  Patient  has agreed to participate in the Acute Inpatient Rehabilitation Program and will admit today.   Preadmission Screen Completed By:  Leita KATHEE Kleine, 11/28/2023 10:48 AM ______________________________________________________________________   Discussed status with Dr. Urbano on 11/28/23 at 900 and received approval for admission today.   Admission Coordinator:  Leita KATHEE Kleine, CCC-SLP, time 1051/Date 11/28/23    Assessment/Plan: Diagnosis: Fall with Displaced odontoid fracture Does the need for close, 24 hr/day Medical supervision in concert with the patient's rehab needs make it unreasonable for this patient to be served in a less intensive setting? Yes Co-Morbidities requiring supervision/potential complications: diastolic HF, AKI, LE edema, CVA hx, HTN, Anemia, L knee pain, HLD, hx of ruptured aortic aneurysm/dissection Due to bladder management, bowel management, safety, skin/wound care, disease management, medication administration, pain management, and patient education, does the patient require 24 hr/day rehab nursing? Yes Does the patient require coordinated care of a physician, rehab nurse, PT, OT, and SLP to address physical and functional deficits in the context of the  above medical diagnosis(es)? Yes Addressing deficits in the following areas: balance, endurance, locomotion, strength, transferring, bowel/bladder control, bathing, dressing, feeding, grooming, toileting, swallowing, and psychosocial support Can the patient actively participate in an intensive therapy program of at least 3 hrs of therapy 5 days a week? Yes The potential for patient to make measurable gains while on inpatient rehab is excellent Anticipated functional outcomes upon discharge from inpatient rehab: modified independent PT, modified independent OT, modified independent SLP Estimated rehab length of stay to reach the above functional goals is: 5-7 days Anticipated discharge destination: Home 10. Overall Rehab/Functional Prognosis: excellent     MD Signature: Murray Urbano

## 2023-11-28 NOTE — H&P (Incomplete)
 Physical Medicine and Rehabilitation Admission H&P    Chief Complaint  Patient presents with   Fall  : HPI: ***  ROS Past Medical History:  Diagnosis Date   Arthritis    Cardiac arrhythmia    Hypertension    Ruptured aortic aneurysm [facility])    Stroke (HCC) 08/23/2021   Past Surgical History:  Procedure Laterality Date   open heart surgery     ruptured aortic aneurysm repair     History reviewed. No pertinent family history. Social History:  reports that he has quit smoking. His smoking use included cigarettes. He has never used smokeless tobacco. He reports that he does not drink alcohol and does not use drugs. Allergies:  Allergies  Allergen Reactions   Short Ragweed Pollen Ext Other (See Comments)    Unknown    Beef-Derived Drug Products Other (See Comments)    Patient does not eat beef    Eliquis  [Apixaban ] Itching   Lasix [Furosemide] Itching   Other Other (See Comments) and Hypertension    Fusid- Legs became very swollen   Tylenol [Acetaminophen] Hives   Medications Prior to Admission  Medication Sig Dispense Refill   atorvastatin  (LIPITOR) 10 MG tablet Take 5 mg by mouth at bedtime.     candesartan (ATACAND) 4 MG tablet Take 0.5 tablets by mouth at bedtime.     ferrous sulfate 325 (65 FE) MG EC tablet Take 325 mg by mouth daily.     metolazone  (ZAROXOLYN ) 2.5 MG tablet Take 1 tablet (2.5 mg total) by mouth daily. 90 tablet 3   Multiple Vitamins-Minerals (MULTIVITAMIN WITH MINERALS) tablet Take 0.5 tablets by mouth daily.     Rivaroxaban  (XARELTO ) 15 MG TABS tablet Take 15 mg by mouth daily with supper.     tamsulosin  (FLOMAX ) 0.4 MG CAPS capsule Take 0.4 mg by mouth at bedtime.     traMADol  (ULTRAM ) 50 MG tablet Take 50 mg by mouth daily as needed for moderate pain (pain score 4-6) or severe pain (pain score 7-10).     [DISCONTINUED] torsemide  (DEMADEX ) 20 MG tablet Take 1 tablet (20 mg total) by mouth daily. 90 tablet 3      Home: Home  Living Family/patient expects to be discharged to:: Private residence Living Arrangements: Spouse/significant other Available Help at Discharge: Family, Available 24 hours/day Type of Home: House Home Access: Stairs to enter [employer] of Steps: 3 steps Entrance Stairs-Rails: Left, Right, Can reach both Home Layout: Bed/bath upstairs, Multi-level Alternate Level Stairs-Number of Steps: chair lift to get upstairs to bedroom Bathroom Shower/Tub: Engineer, manufacturing systems: Standard Bathroom Accessibility: Yes Home Equipment: Agricultural consultant (2 wheels), [employer] - quad, Information systems manager, BSC/3in1, Rollator (4 wheels) Additional Comments: aide drives pt to errands, exercises   Functional History: Prior Function Prior Level of Function : Needs assist Mobility Comments: ambulation with RW upstairs and rollator downstairs ADLs Comments: aide assists with LB dressing, driving, and entertainment during the day.  Functional Status:  Mobility: Bed Mobility Overal bed mobility: Needs Assistance Bed Mobility: Rolling, Sit to Sidelying Rolling: Mod assist, Used rails Sidelying to sit: Mod assist Sit to sidelying: Mod assist General bed mobility comments: in recliner Transfers Overall transfer level: Needs assistance Equipment used: Rolling walker (2 wheels) Transfers: Sit to/from Stand Sit to Stand: Min assist General transfer comment: assist for balance Ambulation/Gait Ambulation/Gait assistance: Contact guard assist, Min assist Gait Distance (Feet): 150 Feet Assistive device: Rolling walker (2 wheels) Gait Pattern/deviations: Step-through pattern, Decreased stride length, Shuffle, Decreased  dorsiflexion - left, Decreased dorsiflexion - right General Gait Details: CGA to min A at times for balance with turns and for walker proximity Gait velocity: decreased Gait velocity interpretation: <1.31 ft/sec, indicative of household ambulator    ADL: ADL Overall ADL's : Needs  assistance/impaired Eating/Feeding: Set up, Sitting Grooming: Minimal assistance, Standing Grooming Details (indicate cue type and reason): intermittent A for balance, cues for compensatory technique to maintain spinal precations Upper Body Bathing: Minimal assistance, Sitting Lower Body Bathing: Moderate assistance, Sit to/from stand Upper Body Dressing : Minimal assistance, Sitting Lower Body Dressing: Moderate assistance, Sit to/from stand Lower Body Dressing Details (indicate cue type and reason): pt continues to attempt to bend to BLE; able to pull up but not don sock within precautions Toilet Transfer: Contact guard assist, Ambulation, Rolling walker (2 wheels), Minimal assistance Toilet Transfer Details (indicate cue type and reason): 1x assts for balance Toileting- Clothing Manipulation and Hygiene: Supervision/safety, Sitting/lateral lean Functional mobility during ADLs: Contact guard assist, Rolling walker (2 wheels), Cueing for safety  Cognition: Cognition Orientation Level: Oriented X4 Cognition Arousal: Alert Behavior During Therapy: WFL for tasks assessed/performed  Physical Exam: Blood pressure 121/66, pulse 60, temperature 97.6 F (36.4 C), temperature source Oral, resp. rate 20, height 5' 8 (1.727 m), weight 90.4 kg, SpO2 90%. Physical Exam  Results for orders placed or performed during the hospital encounter of 11/22/23 (from the past 48 hours)  CBC     Status: Abnormal   Collection Time: 11/27/23  6:08 AM  Result Value Ref Range   WBC 6.6 4.0 - 10.5 K/uL   RBC 3.53 (L) 4.22 - 5.81 MIL/uL   Hemoglobin 10.7 (L) 13.0 - 17.0 g/dL   HCT 66.2 (L) 60.9 - 47.9 %   MCV 95.5 80.0 - 100.0 fL   MCH 30.3 26.0 - 34.0 pg   MCHC 31.8 30.0 - 36.0 g/dL   RDW 85.9 88.4 - 84.4 %   Platelets 178 150 - 400 K/uL   nRBC 0.0 0.0 - 0.2 %    Comment: Performed at [facility], [address], [place], [place] [address]  Renal function panel     Status: Abnormal   Collection  Time: 11/27/23  6:08 AM  Result Value Ref Range   Sodium 140 135 - 145 mmol/L   Potassium 4.3 3.5 - 5.1 mmol/L   Chloride 107 98 - 111 mmol/L   CO2 23 22 - 32 mmol/L   Glucose, Bld 116 (H) 70 - 99 mg/dL    Comment: Glucose reference range applies only to samples taken after fasting for at least 8 hours.   BUN 35 (H) 8 - 23 mg/dL   Creatinine, Ser 8.69 (H) 0.61 - 1.24 mg/dL   Calcium  9.6 8.9 - 10.3 mg/dL   Phosphorus 3.8 2.5 - 4.6 mg/dL   Albumin 2.9 (L) 3.5 - 5.0 g/dL   GFR, Estimated 51 (L) >60 mL/min    Comment: (NOTE) Calculated using the CKD-EPI Creatinine Equation (2021)    Anion gap 10 5 - 15    Comment: Performed at [facility], [address], [place], [place] [address]  CBC     Status: Abnormal   Collection Time: 11/28/23  5:41 AM  Result Value Ref Range   WBC 7.2 4.0 - 10.5 K/uL   RBC 3.52 (L) 4.22 - 5.81 MIL/uL   Hemoglobin 10.7 (L) 13.0 - 17.0 g/dL   HCT 66.7 (L) 60.9 - 47.9 %   MCV 94.3 80.0 -  100.0 fL   MCH 30.4 26.0 - 34.0 pg   MCHC 32.2 30.0 - 36.0 g/dL   RDW 85.9 88.4 - 84.4 %   Platelets 177 150 - 400 K/uL   nRBC 0.0 0.0 - 0.2 %    Comment: Performed at [facility], [address] [place], [place] [address]  Basic metabolic panel     Status: Abnormal   Collection Time: 11/28/23  5:41 AM  Result Value Ref Range   Sodium 141 135 - 145 mmol/L   Potassium 4.0 3.5 - 5.1 mmol/L   Chloride 107 98 - 111 mmol/L   CO2 23 22 - 32 mmol/L   Glucose, Bld 114 (H) 70 - 99 mg/dL    Comment: Glucose reference range applies only to samples taken after fasting for at least 8 hours.   BUN 42 (H) 8 - 23 mg/dL   Creatinine, Ser 8.47 (H) 0.61 - 1.24 mg/dL   Calcium  9.5 8.9 - 10.3 mg/dL   GFR, Estimated 42 (L) >60 mL/min    Comment: (NOTE) Calculated using the CKD-EPI Creatinine Equation (2021)    Anion gap 11 5 - 15    Comment: Performed at [facility], [address], [place], [place] [address]   CT Angio Chest Pulmonary Embolism (PE) W or WO  Contrast Addendum Date: 11/26/2023 ADDENDUM REPORT: 11/26/2023 19:18 ADDENDUM: The previously seen bilobed fatty lesion in the left pectoral area is similar to prior CT. Follow-up as per recommendation of prior CT. Electronically Signed   By: Vanetta Chou M.D.   On: 11/26/2023 19:18   Result Date: 11/26/2023 CLINICAL DATA:  Concern for pulmonary embolism. EXAM: CT ANGIOGRAPHY CHEST WITH CONTRAST TECHNIQUE: Multidetector CT imaging of the chest was performed using the standard protocol during bolus administration of intravenous contrast. Multiplanar CT image reconstructions and MIPs were obtained to evaluate the vascular anatomy. RADIATION DOSE REDUCTION: This exam was performed according to the departmental dose-optimization program which includes automated exposure control, adjustment of the mA and/or kV according to patient size and/or use of iterative reconstruction technique. CONTRAST:  75mL OMNIPAQUE  IOHEXOL  350 MG/ML SOLN COMPARISON:  CT chest abdomen pelvis dated 11/22/2023. FINDINGS: Cardiovascular: There is cardiomegaly. There is retrograde flow of contrast from the right atrium into the IVC suggestive of right heart dysfunction. There is coronary vascular calcification. Advanced atherosclerotic calcification of the thoracic aorta. There is aneurysmal dilatation of the ascending aorta measuring up to approximately 5 cm in diameter and progressed since the prior CT. Evaluation of the aorta is limited due to suboptimal opacification and timing of the contrast. No pulmonary artery embolus identified. Mediastinum/Nodes: No hilar adenopathy. Evaluation however is limited due to consolidative changes of the adjacent lungs. Mildly enlarged lymph node anterior to the carina measures approximately 1 cm in short axis. The esophagus is grossly unremarkable. No mediastinal fluid collection. Lungs/Pleura: Small bilateral pleural effusions with partial compressive atelectasis of the lower lobes versus pneumonia.  There is diffuse interstitial and interlobular septal prominence suggestive of edema. No pneumothorax. The central airways are patent. Upper Abdomen: No acute abnormality. Musculoskeletal: Osteopenia with degenerative changes of spine. Median sternotomy wires. No acute osseous pathology. Review of the MIP images confirms the above findings. IMPRESSION: 1. No CT evidence of pulmonary artery embolus. 2. Cardiomegaly with findings of right heart dysfunction. 3. Small bilateral pleural effusions with partial compressive atelectasis of the lower lobes versus pneumonia. 4. Aneurysmal dilatation of the ascending aorta measuring up to 5 cm and progressed since  the prior CT. Ascending thoracic aortic aneurysm. Recommend semi-annual imaging followup by CTA or MRA and referral to cardiothoracic surgery if not already obtained. This recommendation follows 2010 ACCF/AHA/AATS/ACR/ASA/SCA/SCAI/SIR/STS/SVM Guidelines for the Diagnosis and Management of Patients With Thoracic Aortic Disease. Circulation. 2010; 121: Z733-z630. Aortic aneurysm NOS (ICD10-I71.9) 5.  Aortic Atherosclerosis (ICD10-I70.0). Electronically Signed: By: Vanetta Chou M.D. On: 11/26/2023 19:00      Blood pressure 121/66, pulse 60, temperature 97.6 F (36.4 C), temperature source Oral, resp. rate 20, height 5' 8 (1.727 m), weight 90.4 kg, SpO2 90%.  Medical Problem List and Plan: 1. Functional deficits secondary to ***  -patient may *** shower  -ELOS/Goals: *** 2.  Antithrombotics: -DVT/anticoagulation:  {VTE PROPHYLAXIS/ANTICOAGULATION - UBEZ:[id number]}  -antiplatelet therapy: *** 3. Pain Management: *** 4. Mood/Behavior/Sleep: ***  -antipsychotic agents: *** 5. Neuropsych/cognition: This patient *** capable of making decisions on *** own behalf. 6. Skin/Wound Care: *** 7. Fluids/Electrolytes/Nutrition: ***     ***  Sharlet GORMAN Schmitz, PA-C 11/28/2023

## 2023-11-28 NOTE — TOC Transition Note (Signed)
 Transition of Care (TOC) - Discharge Note Rayfield Gobble RN,BSN Inpatient Care Management Unit 4NP (Non Trauma)- RN Case Manager See Treatment Team for direct Phone #   Patient Details  Name: Zachary Underwood MRN: [id number] Date of Birth: 1928/04/23  Transition of Care [facility]) CM/SW Contact:  Gobble Rayfield Hurst, RN Phone Number: 11/28/2023, 12:05 PM   Clinical Narrative:    Note that pt has bed available for [facility] IN[facility] rehab admit today. Pt/wife agreeable with plan for INPT rehab prior to return home.   Pt stable for transition and d/c order has been placed. Unit staff will transport to [facility] re[facility] this afternoon when bed assigned.   No further IP CM needs noted.    Final next level of care: IP Rehab Facility Barriers to Discharge: Barriers Resolved   Patient Goals and CMS Choice Patient states their goals for this hospitalization and ongoing recovery are:: to be able to return home   Choice offered to / list presented to : Patient, Spouse      Discharge Placement               [facility] rehab        Discharge Plan and Services Additional resources added to the After Visit Summary for   In-house Referral: Clinical Social Work Discharge Planning Services: CM Consult Post Acute Care Choice: IP Rehab          DME Arranged: N/A DME Agency: NA       HH Arranged: NA HH Agency: NA        Social Drivers of Health (SDOH) Interventions SDOH Screenings   Food Insecurity: No Food Insecurity (11/23/2023)  Housing: Unknown (11/23/2023)  Transportation Needs: No Transportation Needs (11/23/2023)  Utilities: Not At Risk (11/23/2023)  Depression (PHQ2-9): Low Risk  (09/04/2021)  Financial Resource Strain: Low Risk  (09/04/2021)  Social Connections: Unknown (11/23/2023)  Stress: No Stress Concern Present (09/04/2021)  Tobacco Use: Medium Risk (11/22/2023)     Readmission Risk Interventions    11/28/2023   12:05 PM  Readmission Risk Prevention Plan  Transportation  Screening Complete  Home Care Screening Complete  Medication Review (RN CM) Complete

## 2023-11-28 NOTE — PMR Pre-admission (Signed)
 PMR Admission Coordinator Pre-Admission Assessment  Patient: Zachary Underwood is an 89 y.o., male MRN: [id number] DOB: [birth year] Height: 5' 8 (172.7 cm) Weight: 90.4 kg  Insurance Information HMO:     PPO:      PCP:      IPA:      80/20: yes     OTHER:  PRIMARY: Medicare Part A and B      Policy#: [id number]     Subscriber: pt Benefits:  Phone #: passport one source     Name:  Eff. Date active    Deduct: $1676      Out of Pocket Max: none      Life Max: none CIR: 100%      SNF: 20 dull days Outpatient: 80%     Co-Pay: 20% Home Health: 100%      Co-Pay:  DME: 80%     Co-Pay: 20% Providers: pt choice SECONDARY: [employer]      Policy#: [id number]      Phone#:   Financial Counselor:       Phone#:   The "Data Collection Information Summary" for patients in Inpatient Rehabilitation Facilities with attached "Privacy Act Statement-Health Care Records" was provided and verbally reviewed with: Patient and Family  Emergency Contact Information Contact Information     Name Relation Home Work Mobile   barbra,billauer,dr Spouse [phone]  [phone]      Other Contacts   None on File     Current Medical History  Patient Admitting Diagnosis: Fall with spinal fx History of Present Illness: Zachary Underwood is a 89 y.o. male with medical history significant for persistent atrial fibrillation on Xarelto , history of CVA, moderate aortic stenosis, s/p surgical repair of ascending thoracic aorta, HTN, HLD, BPH, chronic lower extremity edema who presented to the [facility] ED 11/22/2023 for evaluation after he fell and hit his head and neck. Initial vitals showed BP 137/58, pulse 107, RR 16, temp 97.3 F, SpO2 97% on room air.Labs showed WBC 7.0, hemoglobin 10.6, platelets 170, sodium 138, potassium 3.9, bicarb 24, BUN 27, creatinine 1.38, serum glucose 137, LFTs within normal limits, lactic acid 1.4.CT head with  No evidence of an acute intracranial abnormality, a Frontoparietal  scalp laceration and hematoma, Generalized cerebral atroph and  Known chronic infarcts within the left cerebellar hemisphere. CT maxillofacial: showed No evidence of an acute maxillofacial fracture but acute cervical spine fractures separately reported on same day cervical spine CT. CT of C spine with Displaced type II odontoid fracture with approximately 12 mm posterior displacement relative to the body of C2. Associated prevertebral soft tissue swelling and possible epidural hematoma. There is at least moderate spinal canal stenosis at C1-2. Recommend MRI cervical spine for further evaluation.  Additional mildly displaced fractures through the posterior arch of C1 and an additional nondisplaced fracture through the midline anterior arch of C1, also noted similar anterolisthesis of C5 on C6 likely related to degenerative changes. 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 Edema along the posterior atlantoaxial membrane suggestive of ligamentous injury. Possible disruption of the ligamentum flavum at C2-3 and C3-4.  Pt. In Cervical collar with conservative management recommended. Pt. With some dysarthria and swallowing deficits, seen by SLP and placed on dysphagia 3 diet with nectar thick liquids. PT/OT also saw and recommend CIR to assist return to PLOF.     CT chest/abdomen/pelvis with contrast Past Medical History  Past Medical History:  Diagnosis  Date   Arthritis    Cardiac arrhythmia    Hypertension    Ruptured aortic aneurysm [facility]    Stroke (HCC) 08/23/2021    Has the patient had major surgery during 100 days prior to admission? No  Family History   family history is not on file.  Current Medications  Current Facility-Administered Medications:    amoxicillin -clavulanate (AUGMENTIN ) 875-125 MG per tablet 1 tablet, 1 tablet, Oral, Q12H, Vann, Jessica U, DO   atorvastatin  (LIPITOR) tablet 5 mg, 5 mg, Oral, QHS,  Patel, Vishal R, MD, 5 mg at 11/27/23 2142   celecoxib  (CELEBREX ) capsule 200 mg, 200 mg, Oral, BID, Rai, Ripudeep K, MD, 200 mg at 11/28/23 0845   feeding supplement (ENSURE PLUS HIGH PROTEIN) liquid 237 mL, 237 mL, Oral, BID BM, Rai, Ripudeep K, MD, 237 mL at 11/28/23 0905   fluticasone  (FLONASE ) 50 MCG/ACT nasal spray 2 spray, 2 spray, Each Nare, Daily, Rai, Ripudeep K, MD, 2 spray at 11/28/23 0846   guaiFENesin  (MUCINEX ) 12 hr tablet 600 mg, 600 mg, Oral, BID, Vann, Jessica U, DO   hydrALAZINE  (APRESOLINE ) injection 10 mg, 10 mg, Intravenous, Q6H PRN, Rai, Ripudeep K, MD   HYDROmorphone  (DILAUDID ) injection 0.5 mg, 0.5 mg, Intravenous, Q3H PRN, Vann, Jessica U, DO   irbesartan  (AVAPRO ) tablet 37.5 mg, 37.5 mg, Oral, Daily, Rai, Ripudeep K, MD, 37.5 mg at 11/28/23 0845   lactulose  (CHRONULAC ) 10 GM/15ML solution 20 g, 20 g, Oral, Once, Rai, Ripudeep K, MD   lidocaine  (LIDODERM ) 5 % 1 patch, 1 patch, Transdermal, Q24H, Rai, Ripudeep K, MD   loratadine  (CLARITIN ) tablet 10 mg, 10 mg, Oral, Daily, Rai, Ripudeep K, MD, 10 mg at 11/28/23 0845   methocarbamol  (ROBAXIN ) injection 500 mg, 500 mg, Intravenous, Q8H, Rai, Ripudeep K, MD, 500 mg at 11/28/23 0531   metolazone  (ZAROXOLYN ) tablet 2.5 mg, 2.5 mg, Oral, Daily, Vann, Jessica U, DO   ondansetron  (ZOFRAN ) tablet 4 mg, 4 mg, Oral, Q6H PRN **OR** ondansetron  (ZOFRAN ) injection 4 mg, 4 mg, Intravenous, Q6H PRN, Tobie, Vishal R, MD   oxyCODONE  (Oxy IR/ROXICODONE ) immediate release tablet 2.5 mg, 2.5 mg, Oral, Q3H PRN, Vann, Jessica U, DO   pantoprazole  (PROTONIX ) EC tablet 40 mg, 40 mg, Oral, BID AC, Rai, Ripudeep K, MD, 40 mg at 11/28/23 0845   polyethylene glycol (MIRALAX  / GLYCOLAX ) packet 17 g, 17 g, Oral, Daily, Rai, Ripudeep K, MD, 17 g at 11/28/23 0845   Rivaroxaban  (XARELTO ) tablet 15 mg, 15 mg, Oral, Q supper, Dang, Thuy D, RPH, 15 mg at 11/27/23 1650   senna-docusate (Senokot-S) tablet 1 tablet, 1 tablet, Oral, BID, Rai, Ripudeep K, MD, 1  tablet at 11/28/23 0845   sodium chloride  (OCEAN) 0.65 % nasal spray 1 spray, 1 spray, Each Nare, PRN, Rai, Ripudeep K, MD   sodium chloride  flush (NS) 0.9 % injection 3 mL, 3 mL, Intravenous, Q12H, Patel, Vishal R, MD, 3 mL at 11/28/23 9094   tamsulosin  (FLOMAX ) capsule 0.4 mg, 0.4 mg, Oral, QHS, Patel, Vishal R, MD, 0.4 mg at 11/27/23 2143  Patients Current Diet:  Diet Order             DIET DYS 3 Room service appropriate? Yes with Assist; Fluid consistency: Nectar Thick  Diet effective now                   Precautions / Restrictions Precautions Precautions: Fall, Cervical Precaution Booklet Issued: Yes (comment) Precaution/Restrictions Comments: precautions reviewed for UE lifting Cervical Brace: At all  times, Hard collar Restrictions Weight Bearing Restrictions Per Provider Order: No   Has the patient had 2 or more falls or a fall with injury in the past year? Yes  Prior Activity Level Community (5-7x/wk): Pt. active in the communiity  Prior Functional Level Self Care: Did the patient need help bathing, dressing, using the toilet or eating? Independent  Indoor Mobility: Did the patient need assistance with walking from room to room (with or without device)? Independent  Stairs: Did the patient need assistance with internal or external stairs (with or without device)? Needed some help  Functional Cognition: Did the patient need help planning regular tasks such as shopping or remembering to take medications? Independent  Patient Information Are you of Hispanic, Latino/a,or Spanish origin?: A. No, not of Hispanic, Latino/a, or Spanish origin What is your race?: A. White Do you need or want an interpreter to communicate with a doctor or health care staff?: 0. No  Patient's Response To:  Health Literacy and Transportation Is the patient able to respond to health literacy and transportation needs?: No Health Literacy - How often do you need to have someone help you  when you read instructions, pamphlets, or other written material from your doctor or pharmacy?: Never In the past 12 months, has lack of transportation kept you from medical appointments or from getting medications?: No In the past 12 months, has lack of transportation kept you from meetings, work, or from getting things needed for daily living?: No  Home Assistive Devices / Equipment Home Equipment: Agricultural consultant (2 wheels), [employer] - quad, Information systems manager, BSC/3in1, Rollator (4 wheels)  Prior Device Use: Indicate devices/aids used by the patient prior to current illness, exacerbation or injury? Walker  Current Functional Level Cognition  Orientation Level: Oriented X4    Extremity Assessment (includes Sensation/Coordination)  Upper Extremity Assessment: Generalized weakness  Lower Extremity Assessment: Defer to PT evaluation    ADLs  Overall ADL's : Needs assistance/impaired Eating/Feeding: Set up, Sitting Grooming: Minimal assistance, Standing Grooming Details (indicate cue type and reason): intermittent A for balance, cues for compensatory technique to maintain spinal precations Upper Body Bathing: Minimal assistance, Sitting Lower Body Bathing: Moderate assistance, Sit to/from stand Upper Body Dressing : Minimal assistance, Sitting Lower Body Dressing: Moderate assistance, Sit to/from stand Lower Body Dressing Details (indicate cue type and reason): pt continues to attempt to bend to BLE; able to pull up but not don sock within precautions Toilet Transfer: Contact guard assist, Ambulation, Rolling walker (2 wheels), Minimal assistance Toilet Transfer Details (indicate cue type and reason): 1x assts for balance Toileting- Clothing Manipulation and Hygiene: Supervision/safety, Sitting/lateral lean Functional mobility during ADLs: Contact guard assist, Rolling walker (2 wheels), Cueing for safety    Mobility  Overal bed mobility: Needs Assistance Bed Mobility: Rolling, Sit to  Sidelying Rolling: Mod assist, Used rails Sidelying to sit: Mod assist Sit to sidelying: Mod assist General bed mobility comments: in recliner    Transfers  Overall transfer level: Needs assistance Equipment used: Rolling walker (2 wheels) Transfers: Sit to/from Stand Sit to Stand: Min assist General transfer comment: assist for balance    Ambulation / Gait / Stairs / Wheelchair Mobility  Ambulation/Gait Ambulation/Gait assistance: Contact guard assist, Min assist Gait Distance (Feet): 150 Feet Assistive device: Rolling walker (2 wheels) Gait Pattern/deviations: Step-through pattern, Decreased stride length, Shuffle, Decreased dorsiflexion - left, Decreased dorsiflexion - right General Gait Details: CGA to min A at times for balance with turns and for walker proximity Gait velocity: decreased  Gait velocity interpretation: <1.31 ft/sec, indicative of household ambulator    Posture / Balance Dynamic Sitting Balance Sitting balance - Comments: heavy UE support on arms of chair to pull forward, can sit once upright with S Balance Overall balance assessment: Needs assistance Sitting-balance support: Feet supported Sitting balance-Leahy Scale: Good Sitting balance - Comments: heavy UE support on arms of chair to pull forward, can sit once upright with S Standing balance support: Bilateral upper extremity supported Standing balance-Leahy Scale: Poor Standing balance comment: UE support for balance and CGA    Special considerations/life events  Skin scalp lac and Special service needs c collar   Previous Home Environment (from acute therapy documentation) Living Arrangements: Spouse/significant other Available Help at Discharge: Family, Available 24 hours/day Type of Home: House Home Layout: Bed/bath upstairs, Multi-level Alternate Level Stairs-Number of Steps: chair lift to get upstairs to bedroom Home Access: Stairs to enter Entrance Stairs-Rails: Left, Right, Can reach  both Entrance Stairs-Number of Steps: 3 steps Bathroom Shower/Tub: Engineer, manufacturing systems: Standard Bathroom Accessibility: Yes How Accessible: Accessible via walker Additional Comments: aide drives pt to errands, exercises  Discharge Living Setting Plans for Discharge Living Setting: House Type of Home at Discharge: House Discharge Home Layout: Multi-level, Able to live on main level with bedroom/bathroom Alternate Level Stairs-Rails: Right, Left Alternate Level Stairs-Number of Steps: chair lift Discharge Home Access: Stairs to enter Entrance Stairs-Rails: Right, Left Entrance Stairs-Number of Steps: 3 Discharge Bathroom Shower/Tub: Walk-in shower Discharge Bathroom Toilet: Handicapped height Discharge Bathroom Accessibility: Yes How Accessible: Accessible via walker  Social/Family/Support Systems Patient Roles: Spouse Contact Information: [phone] Anticipated Caregiver: Dr [name] Hageman Anticipated Caregiver's Contact Information: Wife and caregiver 24/7 Caregiver Availability: 24/7 Discharge Plan Discussed with Primary Caregiver: Yes Is Caregiver In Agreement with Plan?: Yes Does Caregiver/Family have Issues with Lodging/Transportation while Pt is in Rehab?: No  Goals Patient/Family Goal for Rehab: PT/OT/SLP Mod I Expected length of stay: 5-7 days Pt/Family Agrees to Admission and willing to participate: Yes Program Orientation Provided & Reviewed with Pt/Caregiver Including Roles  & Responsibilities: Yes  Decrease burden of Care through IP rehab admission: Not anticipated  Possible need for SNF placement upon discharge: Not anticipated  Patient Condition: I have reviewed medical records from [facility] , spoken with CM, and patient. I met with patient at the bedside for inpatient rehabilitation assessment.  Patient will benefit from ongoing PT, OT, and SLP, can actively participate in 3 hours of therapy a day 5 days of the week, and can  make measurable gains during the admission.  Patient will also benefit from the coordinated team approach during an Inpatient Acute Rehabilitation admission.  The patient will receive intensive therapy as well as Rehabilitation physician, nursing, social worker, and care management interventions.  Due to safety, skin/wound care, disease management, medication administration, pain management, and patient education the patient requires 24 hour a day rehabilitation nursing.  The patient is currently CGA  with mobility and basic ADLs.  Discharge setting and therapy post discharge at home with home health is anticipated.  Patient has agreed to participate in the Acute Inpatient Rehabilitation Program and will admit today.  Preadmission Screen Completed By:  Leita KATHEE Kleine, 11/28/2023 10:48 AM ______________________________________________________________________   Discussed status with Dr. Urbano on 11/28/23 at 900 and received approval for admission today.  Admission Coordinator:  Leita KATHEE Kleine, CCC-SLP, time 1051/Date 11/28/23   Assessment/Plan: Diagnosis: Fall with Displaced odontoid fracture Does the need for close, 24 hr/day Medical supervision in  concert with the patient's rehab needs make it unreasonable for this patient to be served in a less intensive setting? Yes Co-Morbidities requiring supervision/potential complications: diastolic HF, AKI, LE edema, CVA hx, HTN, Anemia, L knee pain, HLD, hx of ruptured aortic aneurysm/dissection Due to bladder management, bowel management, safety, skin/wound care, disease management, medication administration, pain management, and patient education, does the patient require 24 hr/day rehab nursing? Yes Does the patient require coordinated care of a physician, rehab nurse, PT, OT, and SLP to address physical and functional deficits in the context of the above medical diagnosis(es)? Yes Addressing deficits in the following areas: balance, endurance, locomotion,  strength, transferring, bowel/bladder control, bathing, dressing, feeding, grooming, toileting, swallowing, and psychosocial support Can the patient actively participate in an intensive therapy program of at least 3 hrs of therapy 5 days a week? Yes The potential for patient to make measurable gains while on inpatient rehab is excellent Anticipated functional outcomes upon discharge from inpatient rehab: modified independent PT, modified independent OT, modified independent SLP Estimated rehab length of stay to reach the above functional goals is: 5-7 days Anticipated discharge destination: Home 10. Overall Rehab/Functional Prognosis: excellent   MD Signature: Murray Collier

## 2023-11-28 NOTE — Discharge Summary (Signed)
 Physician Discharge Summary  Zachary Underwood FMW:[id number] DOB: 1928-02-13 DOA: 11/22/2023  PCP: [name]  Admit date: 11/22/2023 Discharge date: 11/28/2023  Admitted From: Home Discharge disposition: C IR   Recommendations for Outpatient Follow-Up:   Please discussed with wife prior to any medication changes X-ray of cervical spine pending per wife's request to evaluate stability of spine as recommended by a specialist at the wife spoke to Referral placed to Dr. Burnetta for second opinion BMP daily for now (hold ARB)   Discharge Diagnosis:   Principal Problem:   Closed displaced fracture of second cervical vertebra (HCC) Active Problems:   Persistent atrial fibrillation (HCC)   Hyperlipidemia   Primary hypertension   Nonrheumatic aortic valve stenosis   Lower extremity edema   History of CVA (cerebrovascular accident)   AKI (acute kidney injury) (HCC)   Normocytic anemia   Bradycardia   Chronic atrial fibrillation (HCC)   Chronic diastolic CHF (congestive heart failure) (HCC)   Coronary artery calcification seen on CAT scan   Ascending aortic aneurysm (HCC)    Discharge Condition: Improved.  Diet recommendation: Dysphagia diet  Wound care: None.  Code status: Full.   History of Present Illness:   Zachary Underwood is a 89 y.o. male with medical history significant for persistent atrial fibrillation on Xarelto , history of CVA, moderate aortic stenosis, s/p surgical repair of ascending thoracic aorta, HTN, HLD, BPH, chronic lower extremity edema who presented to the ED for evaluation after he fell and hit his head and neck.   Patient was trying to get into his car earlier today when he slipped and fell.  He hit his head and neck.  He did not lose consciousness.  He was having pain at the back of his neck.  EMS were called and he was brought to the ED for further evaluation.   Patient is functional at baseline and continues to teach as a  professor.   ED Course  Labs/Imaging on admission: I have personally reviewed following labs and imaging studies.   Initial vitals showed BP 137/58, pulse 107, RR 16, temp 97.3 F, SpO2 97% on room air.   Labs showed WBC 7.0, hemoglobin 10.6, platelets 170, sodium 138, potassium 3.9, bicarb 24, BUN 27, creatinine 1.38, serum glucose 137, LFTs within normal limits, lactic acid 1.4.     Hospital Course by Problem:   Type II posterior displaced odontoid fracture occurring after mechanical fall C1 fracture: - Neurosurgery was consulted, evaluated by Dr Lanis, recommended non-operative management at this time.   - [place] J collar in place, ongoing management as per neurosurgery. - Pain better controlled, continue celebrex  (monitor Cr), pain meds as needed.  - Continue PPI, twice daily for GI prophylaxis - Vitamin D  level stable 36.8 - PT OT evaluation-> recommending CIR-consult placed -MRI brain done,showed no acute stroke -Wife would like to follow-up and have a second opinion by orthospine-all 3 orthospine have declined to see inpatient-- will place outpatient referral to Dr. Burnetta as patient follows with Dr. Ernie   Persistent atrial fibrillation with bradycardia: - Remains in atrial fibrillation, bradycardic -  Xarelto  on hold.  He was given Kcentra  in the ED. -no epidural hematoma however did have edema and blood products interposed between dens and C2.  - Cardiology following, no indication for temporary pacing at this time, remains asymptomatic.   Acute kidney injury: - Mild with creatinine 1.38 on admission, baseline ~1.0.   - trend Cr-- suspect patient  dry from dose of diuretic-- follow at rehab   History of diastolic HF, chronic lower extremity edema - Reviewed cardiology recommendations however patient's wife requested metolazone  and not Demadex . - Continue to follow renal function - Currently on metolazone  2.5 mg daily, BNP 261-- wife says this CAN NOT BE REMOVED -  Chest x-ray showed cardiomegaly with chronic interstitial coarsening, nonspecific,?  Mild pulmonary venous congestion. - Torsemide  20 mg x 1 but this seemed to have caused AKI - 2D echo showed EF of 70 to 75%, no regional WMA, mild mitral stenosis, moderate aortic valve stenosis, moderate dilation of ascending aorta measuring 47 mm   Dysphagia with risk of aspiration - Coarse lung sounds, concerned about aspiration - SLP evaluation, recommended dysphagia 3 diet with nectar thick liquids - CTA with ? PNA vs atelectasis-- will treat with Augmentin      History of CVA: - Continue atorvastatin . -MRI brain showed no acute stroke   Hypertension: -  metolazone    Mild anemia: - H/H stable  - Anemia panel showed anemia of chronic disease however iron stores still lower FE 30, percent saturation 10, normal ferritin 216   Left knee pain -Orthopedics consulted for cortisone injection   Hyperlipidemia: Continue atorvastatin .   History of ruptured aortic aneurysm/dissection, -Status post repair in [place].  CT of the chest showed ascending aorta aneurysmal at 4.7 cm with no dissection, -Per cardiology, can be followed outpatient     Estimated body mass index is 30.3 kg/m as calculated from the following:   Height as of this encounter: 5' 8 (1.727 m).   Weight as of this encounter: 90.4 kg.  Bradycardia - per cards: Do not think there is any indication for temporary pacing at this time as he remains asymptomatic    Medical Consultants:   NS cards   Discharge Exam:   Vitals:   11/28/23 0434 11/28/23 1131  BP:  121/66  Pulse: 64 60  Resp: 15 20  Temp:  97.6 F (36.4 C)  SpO2: 100% 90%   Vitals:   11/28/23 0312 11/28/23 0405 11/28/23 0434 11/28/23 1131  BP: (!) 153/56   121/66  Pulse: (!) 53 (!) 43 64 60  Resp: (!) 23 (!) 35 15 20  Temp: 97.6 F (36.4 C)   97.6 F (36.4 C)  TempSrc: Oral   Oral  SpO2: 93% 90% 100% 90%  Weight:      Height:        General exam:  Appears calm and comfortable.    The results of significant diagnostics from this hospitalization (including imaging, microbiology, ancillary and laboratory) are listed below for reference.     Procedures and Diagnostic Studies:   MR Cervical Spine Wo Contrast Addendum Date: 11/22/2023 ** ADDENDUM #1 ** EXAM: MRI CERVICAL SPINE WITHOUT CONTRAST 11/22/2023 04:20:45 PM TECHNIQUE: Multiplanar multisequence MRI of the cervical spine was performed without the administration of intravenous contrast. COMPARISON: CT cervical spine earlier same day. CLINICAL HISTORY: Better assess seen fracture on CT as well as the hematoma. Pt given morphine  before exam. Difficult time laying flat, in a lot of pain. Propeller sequences ran as repeats. Best images possible. FINDINGS: BONES AND ALIGNMENT: Displaced type 2 odontoid fracture with significant posterior dislocation of the dens relative to the body of C2, similar to the earlier CT. Edema and possible blood products are interposed between the dens and posterior superior body of C2. Displacement of the fracture site indents the ventral thecal sac at C1-2. Vertebral body heights are otherwise maintained.  No suspicious osseous lesion. SPINAL CORD: Flattening of the ventral cervical cord without evidence of compression. CSF flow artifact along the ventral aspect of the cervical cord at C1-2, compatible with significant stenosis. SOFT TISSUES: Significant prevertebral soft tissue swelling extending from the skull base to the level of C7-T1. Edema within the paraspinal musculature extending from the skull base to the level of C6. C2-C3: Additional disruption of the posterior longitudinal ligament along the posterior aspect of C2. Possible disruption of the ligamentum flavum at C2-3 and C3-4. Edema along the posterior atlantoaxial membrane suggestive of ligamentous injury. C3-C4: Possible disruption of the ligamentum flavum at C3-4. C4-C5: No significant disc herniation. No spinal  canal stenosis or neural foraminal narrowing. C5-C6: Disc osteophyte complex which indents the ventral thecal sac, contributing to mild spinal canal stenosis. C6-C7: Disc osteophyte complex which indents the ventral thecal sac. Facet arthrosis. C7-T1: No significant disc herniation. No spinal canal stenosis or neural foraminal narrowing. SPINAL CANAL STENOSIS: Moderate spinal canal stenosis at C1-2, best appreciated on sagittal images with limited evaluation on axial images due to positioning and motion artifact. Additional mild spinal canal stenosis at C5-6. IMPRESSION: 1. Displaced type 2 odontoid fracture with significant posterior dislocation of the dens relative to the body of C2. Associated edema and possible blood products interposed between dens and C2. No definite epidural hematoma. 2. Displacement of dens as well as malalignment of the bilateral atlantoaxial articulations results in moderate spinal canal stenosis and ventral cervical cord flattening. 3. Disruption of the anterior longitudinal ligament and posterior longitudinal ligament at C2. Edema along the posterior atlantoaxial membrane suggestive of ligamentous injury. Possible disruption of the ligamentum flavum at C2-3 and C3-4. 4. C1 fractures better seen on CT. 5. Significant prevertebral edema. 6. Disc osteophyte complexes at C5-6 contributing to mild spinal canal stenosis. 7. Findings discussed with Dr. Cecilia at 5:22 PM on 11/22/23. Electronically signed by: Donnice Mania MD 11/22/2023 05:37 PM EDT RP Workstation: [id number]   Result Date: 11/22/2023 ** ORIGINAL REPORT ** EXAM: MRI CERVICAL SPINE WITHOUT CONTRAST 11/22/2023 04:20:45 PM TECHNIQUE: Multiplanar multisequence MRI of the cervical spine was performed without the administration of intravenous contrast. COMPARISON: CT cervical spine earlier same day. CLINICAL HISTORY: Better assess seen fracture on CT as well as the hematoma. Pt given morphine  before exam. Difficult time laying flat, in a  lot of pain. Propeller sequences ran as repeats. Best images possible. FINDINGS: BONES AND ALIGNMENT: Displaced type 2 odontoid fracture with significant posterior dislocation of the dens relative to the body of C2, similar to the earlier CT. Edema and possible blood products are interposed between the dens and posterior superior body of C2. Displacement of the fracture site indents the ventral thecal sac at C1-2. Vertebral body heights are otherwise maintained. No suspicious osseous lesion. SPINAL CORD: Flattening of the ventral cervical cord without evidence of compression. CSF flow artifact along the ventral aspect of the cervical cord at C1-2, compatible with significant stenosis. SOFT TISSUES: Significant prevertebral soft tissue swelling extending from the skull base to the level of C7-T1. Edema within the paraspinal musculature extending from the skull base to the level of C6. C2-C3: Additional disruption of the posterior longitudinal ligament along the posterior aspect of C2. Possible disruption of the ligamentum flavum at C2-3 and C3-4. Edema along the posterior atlantoaxial membrane suggestive of ligamentous injury. C3-C4: Possible disruption of the ligamentum flavum at C3-4. C4-C5: No significant disc herniation. No spinal canal stenosis or neural foraminal narrowing. C5-C6: Disc osteophyte complex which indents  the ventral thecal sac, contributing to mild spinal canal stenosis. C6-C7: Disc osteophyte complex which indents the ventral thecal sac. Facet arthrosis. C7-T1: No significant disc herniation. No spinal canal stenosis or neural foraminal narrowing. SPINAL CANAL STENOSIS: Moderate spinal canal stenosis at C1-2, best appreciated on sagittal images with limited evaluation on axial images due to positioning and motion artifact. Additional mild spinal canal stenosis at C5-6. IMPRESSION: 1. Displaced type 2 odontoid fracture with significant posterior dislocation of the dens relative to the body of C2.  Associated edema and possible blood products interposed between dens and C2. No definite epidural hematoma. 2. Displacement of dens as well as malalignment of the bilateral atlantoaxial articulations results in moderate spinal canal stenosis and ventral cervical cord flattening. 3. Disruption of the anterior longitudinal ligament and posterior longitudinal ligament at C2. Edema along the posterior atlantoaxial membrane suggestive of ligamentous injury. Possible disruption of the ligamentum flavum at C2-3 and C3-4. 4. C1 fractures better seen on CT. 5. Significant prevertebral edema. 6. Disc osteophyte complexes at C5-6 contributing to mild spinal canal stenosis. Electronically signed by: Donnice Mania MD 11/22/2023 05:12 PM EDT RP Workstation: [id number]   CT CERVICAL SPINE WO CONTRAST Addendum Date: 11/22/2023 ** ADDENDUM #1 ** EXAM: CT CERVICAL SPINE WITHOUT CONTRAST 11/22/2023 01:07:00 PM TECHNIQUE: CT of the cervical spine was performed without the administration of intravenous contrast. Multiplanar reformatted images are provided for review. Automated exposure control, iterative reconstruction, and/or weight based adjustment of the mA/kV was utilized to reduce the radiation dose to as low as reasonably achievable. COMPARISON: CTA head and neck dated 08/16/2023. CLINICAL HISTORY: Polytrauma, blunt. Attempting to get in vehicle with caregiver. Mechanical fall while trying to get in vehicle. Hit head. Laceration to head. Complains of pain in the back of neck. On Xarelto . FINDINGS: CERVICAL SPINE: BONES AND ALIGNMENT: There is a displaced fracture through the base of the dens with approximately 12 mm posterior displacement relative to the body of C2. The dens remains articulated with the anterior arch of C1. There are additional mildly displaced fractures through the posterior arch of C1 along the right and left posterolateral aspects. There is likely an additional nondisplaced fracture through the midline  anterior arch of C1. Lucency within the left lateral mass of C2 extending into the left articular pillar favored to reflect a nutrient vessel foramen baseline appearance on coronal images. Cervical lordosis is maintained. Similar anterolisthesis of C5 on C6 likely related to degenerative changes. There is no compression fracture in the cervical spine. DEGENERATIVE CHANGES: Facet arthrosis and uncovertebral hypertrophy at multiple levels in the cervical spine. SOFT TISSUES: Soft tissue surrounding the dens appears increased from prior and may reflect component of hematoma. There is prevertebral soft tissue swelling extending from C1 to C3-4 possible hematoma and fracture displacement results in at least moderate spinal canal stenosis at C1-2. The paraspinal musculature is unremarkable. IMPRESSION: 1. Displaced type II odontoid fracture with approximately 12 mm posterior displacement relative to the body of C2. Associated prevertebral soft tissue swelling and possible epidural hematoma. There is at least moderate spinal canal stenosis at C1-2. Recommend MRI cervical spine for further evaluation. 2. Additional mildly displaced fractures through the posterior arch of C1 and an additional nondisplaced fracture through the midline anterior arch of C1. 3. Similar anterolisthesis of C5 on C6 likely related to degenerative changes. 4. Findings discussed with Dr. Simon at 1:50PM on 11/22/23. Electronically signed by: Donnice Mania MD 11/22/2023 01:53 PM EDT RP Workstation: [id number]   Result Date: 11/22/2023 **  ORIGINAL REPORT ** EXAM: CT CERVICAL SPINE WITHOUT CONTRAST 11/22/2023 01:07:00 PM TECHNIQUE: CT of the cervical spine was performed without the administration of intravenous contrast. Multiplanar reformatted images are provided for review. Automated exposure control, iterative reconstruction, and/or weight based adjustment of the mA/kV was utilized to reduce the radiation dose to as low as reasonably achievable.  COMPARISON: CTA head and neck dated 08/16/2023. CLINICAL HISTORY: Polytrauma, blunt. Attempting to get in vehicle with caregiver. Mechanical fall while trying to get in vehicle. Hit head. Laceration to head. Complains of pain in the back of neck. On Xarelto . FINDINGS: CERVICAL SPINE: BONES AND ALIGNMENT: There is a displaced fracture through the base of the dens with approximately 12 mm posterior displacement relative to the body of C2. The dens remains articulated with the anterior arch of C1. There are additional mildly displaced fractures through the posterior arch of C1 along the right and left posterolateral aspects. There is likely an additional nondisplaced fracture through the midline anterior arch of C1. Lucency within the left lateral mass of C2 extending into the left articular pillar favored to reflect a nutrient vessel foramen baseline appearance on coronal images. Cervical lordosis is maintained. Similar anterolisthesis of C5 on C6 likely related to degenerative changes. There is no compression fracture in the cervical spine. DEGENERATIVE CHANGES: Facet arthrosis and uncovertebral hypertrophy at multiple levels in the cervical spine. SOFT TISSUES: Soft tissue surrounding the dens appears increased from prior and may reflect component of hematoma. There is prevertebral soft tissue swelling extending from C1 to C3-4 possible hematoma and fracture displacement results in at least moderate spinal canal stenosis at C1-2. The paraspinal musculature is unremarkable. IMPRESSION: 1. Displaced type II odontoid fracture with approximately 12 mm posterior displacement relative to the body of C2. Associated prevertebral soft tissue swelling and possible epidural hematoma. There is at least moderate spinal canal stenosis at C1-2. Recommend MRI cervical spine for further evaluation. 2. Additional mildly displaced fractures through the posterior arch of C1 and an additional nondisplaced fracture through the midline  anterior arch of C1. 3. Similar anterolisthesis of C5 on C6 likely related to degenerative changes. Electronically signed by: Donnice Mania MD 11/22/2023 01:33 PM EDT RP Workstation: [id number]   CT HEAD WO CONTRAST Result Date: 11/22/2023 CLINICAL DATA:  Provided history: Head trauma, moderate/severe. Facial trauma, blunt. Mechanical fall. Head laceration. Neck pain. On Xarelto . EXAM: CT HEAD WITHOUT CONTRAST CT MAXILLOFACIAL WITHOUT CONTRAST TECHNIQUE: Multidetector CT imaging of the head and maxillofacial structures were performed using the standard protocol without intravenous contrast. Multiplanar CT image reconstructions of the maxillofacial structures were also generated. RADIATION DOSE REDUCTION: This exam was performed according to the departmental dose-optimization program which includes automated exposure control, adjustment of the mA and/or kV according to patient size and/or use of iterative reconstruction technique. COMPARISON:  Brain MRI 07/24/2023. CT angiogram head/neck 07/24/2023. FINDINGS: CT HEAD FINDINGS Brain: Generalized cerebral atrophy. 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 No demarcated cortical infarct. No extra-axial fluid collection. No evidence of an intracranial mass. No midline shift. Vascular: No hyperdense vessel.  Atherosclerotic calcifications. Skull: No calvarial fracture or aggressive osseous lesion. Other: Frontoparietal scalp laceration and hematoma. CT MAXILLOFACIAL FINDINGS Osseous: No acute maxillofacial fracture is identified. Orbits: No acute orbital finding. Sinuses: No significant paranasal sinus disease. Soft tissues: No maxillofacial hematoma appreciable by CT. Other: Acute cervical spine fractures separately reported on same day cervical spine CT. IMPRESSION: CT head: 1.  No evidence of an acute  intracranial abnormality. 2. Frontoparietal scalp laceration and hematoma. 3. Generalized cerebral atrophy.  4. 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 CT maxillofacial: 1. No evidence of an acute maxillofacial fracture. 2. Acute cervical spine fractures separately reported on same day cervical spine CT. Electronically Signed   By: Rockey Childs D.O.   On: 11/22/2023 13:39   CT MAXILLOFACIAL WO CONTRAST Result Date: 11/22/2023 CLINICAL DATA:  Provided history: Head trauma, moderate/severe. Facial trauma, blunt. Mechanical fall. Head laceration. Neck pain. On Xarelto . EXAM: CT HEAD WITHOUT CONTRAST CT MAXILLOFACIAL WITHOUT CONTRAST TECHNIQUE: Multidetector CT imaging of the head and maxillofacial structures were performed using the standard protocol without intravenous contrast. Multiplanar CT image reconstructions of the maxillofacial structures were also generated. RADIATION DOSE REDUCTION: This exam was performed according to the departmental dose-optimization program which includes automated exposure control, adjustment of the mA and/or kV according to patient size and/or use of iterative reconstruction technique. COMPARISON:  Brain MRI 07/24/2023. CT angiogram head/neck 07/24/2023. FINDINGS: CT HEAD FINDINGS Brain: Generalized cerebral atrophy. 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 No demarcated cortical infarct. No extra-axial fluid collection. No evidence of an intracranial mass. No midline shift. Vascular: No hyperdense vessel.  Atherosclerotic calcifications. Skull: No calvarial fracture or aggressive osseous lesion. Other: Frontoparietal scalp laceration and hematoma. CT MAXILLOFACIAL FINDINGS Osseous: No acute maxillofacial fracture is identified. Orbits: No acute orbital finding. Sinuses: No significant paranasal sinus disease. Soft tissues: No maxillofacial hematoma appreciable by CT. Other: Acute cervical spine fractures separately reported on same day cervical spine CT. IMPRESSION: CT head: 1.  No evidence of an acute  intracranial abnormality. 2. Frontoparietal scalp laceration and hematoma. 3. Generalized cerebral atrophy. 4. 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 CT maxillofacial: 1. No evidence of an acute maxillofacial fracture. 2. Acute cervical spine fractures separately reported on same day cervical spine CT. Electronically Signed   By: Rockey Childs D.O.   On: 11/22/2023 13:39   CT CHEST ABDOMEN PELVIS W CONTRAST Result Date: 11/22/2023 CLINICAL DATA:  Fall while trying to get into car. EXAM: CT CHEST, ABDOMEN, AND PELVIS WITH CONTRAST TECHNIQUE: Multidetector CT imaging of the chest, abdomen and pelvis was performed following the standard protocol during bolus administration of intravenous contrast. RADIATION DOSE REDUCTION: This exam was performed according to the departmental dose-optimization program which includes automated exposure control, adjustment of the mA and/or kV according to patient size and/or use of iterative reconstruction technique. CONTRAST:  75mL OMNIPAQUE  IOHEXOL  350 MG/ML SOLN COMPARISON:  April 24, 2022. 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 No dissection is noted. Mild cardiomegaly. No pericardial effusion. Coronary artery calcifications are noted. Mediastinum/Nodes: No enlarged mediastinal, hilar, or axillary lymph nodes. Thyroid gland, trachea, and esophagus demonstrate no significant findings. Lungs/Pleura: No pneumothorax or pleural effusion is noted. No acute pulmonary disease is noted. Musculoskeletal: 8.5 x 4.7 cm bilobed fat containing lesion is noted in left pectoral region which was present on prior exam, but now 13 mm solid nodule is noted. Lipoma or liposarcoma cannot be excluded. No definite osseous abnormality is noted. CT ABDOMEN PELVIS FINDINGS Hepatobiliary: No focal liver abnormality is seen. No gallstones, gallbladder wall thickening, or  biliary dilatation. Pancreas: Unremarkable. No pancreatic ductal dilatation or surrounding inflammatory changes. Spleen: Normal in size without focal abnormality. Adrenals/Urinary Tract: Adrenal glands are unremarkable. Kidneys are normal, without renal calculi, focal lesion, or hydronephrosis. Bladder  is unremarkable. Stomach/Bowel: Stomach is unremarkable. No evidence of bowel obstruction or inflammation. Status post appendectomy. Vascular/Lymphatic: Aortic atherosclerosis. No enlarged abdominal or pelvic lymph nodes. Reproductive: Status post prostatic brachytherapy seed placement. Other: No ascites or hernia is noted. Musculoskeletal: Probable old L2 compression fracture. No acute osseous abnormality is noted. 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 Recommend semi-annual imaging followup by CTA or MRA and referral to cardiothoracic surgery if not already obtained. This recommendation follows 2010 ACCF/AHA/AATS/ACR/ASA/SCA/SCAI/SIR/STS/SVM Guidelines for the Diagnosis and Management of Patients With Thoracic Aortic Disease. Circulation. 2010; 121: Z733-z630. Aortic aneurysm NOS (ICD10-I71.9). 8.5 x 4.7 cm bilobed fatty lesion seen in left pectoral region which was present on prior exam of 2024, but now noted 13 mm nodule associated with this lesion. This may simply represent lipoma, but liposarcoma cannot be excluded. MRI may be performed for further evaluation. Status post prostatic brachytherapy seed placement. Probable old L2 compression fracture. Coronary artery calcifications are noted suggesting coronary artery disease. Aortic Atherosclerosis (ICD10-I70.0). Electronically Signed   By: Lynwood Landy Raddle M.D.   On: 11/22/2023 13:33   DG Chest Port 1 View Result Date: 11/22/2023 CLINICAL DATA:  Status post fall EXAM: PORTABLE CHEST 1 VIEW COMPARISON:  None Available. FINDINGS: Cardiomediastinal silhouette is enlarged. Aortic knob is  calcified. Bilateral vascular congestion and increased interstitial markings of both lung fields. Atherosclerotic calcifications of the aortic annulus is suspected. Query left basilar atelectasis. No pleural effusion or pneumothorax. Right posterior sixth rib healed fracture. No acute fracture identified. Median sternotomy. IMPRESSION: Enlarged cardiomediastinal silhouette and bilateral vascular congestion. Chronic healed fracture of right sixth rib. No new fracture within the limitations of chest radiograph. Electronically Signed   By: Megan  Zare M.D.   On: 11/22/2023 12:58   DG Pelvis Portable Result Date: 11/22/2023 CLINICAL DATA:  Fall. EXAM: PORTABLE PELVIS 1-2 VIEWS COMPARISON:  None Available. FINDINGS: There is no evidence of pelvic fracture or diastasis. No pelvic bone lesions are seen. IMPRESSION: Negative. Electronically Signed   By: Lynwood Landy Raddle M.D.   On: 11/22/2023 12:40     Labs:   Basic Metabolic Panel: Recent Labs  Lab 11/24/23 0451 11/25/23 0749 11/26/23 0647 11/27/23 0608 11/28/23 0541  NA 138 139 139 140 141  K 3.7 3.5 3.5 4.3 4.0  CL 106 107 107 107 107  CO2 22 24 24 23 23   GLUCOSE 113* 95 96 116* 114*  BUN 29* 31* 35* 35* 42*  CREATININE 1.22 1.21 1.19 1.30* 1.52*  CALCIUM  9.3 9.3 9.2 9.6 9.5  PHOS 3.5 3.2 3.3 3.8  --    GFR Estimated Creatinine Clearance: 32.4 mL/min (A) (by C-G formula based on SCr of 1.52 mg/dL (H)). Liver Function Tests: Recent Labs  Lab 11/22/23 1221 11/24/23 0451 11/25/23 0749 11/26/23 0647 11/27/23 0608  AST 31  --   --   --   --   ALT 19  --   --   --   --   ALKPHOS 107  --   --   --   --   BILITOT 1.1  --   --   --   --   PROT 6.1*  --   --   --   --   ALBUMIN 3.3* 2.9* 2.8* 2.7* 2.9*   No results for input(s): LIPASE, AMYLASE in the last 168 hours. No results for input(s): AMMONIA in the last 168 hours. Coagulation profile Recent Labs  Lab 11/22/23 1251  INR 2.0*    CBC: Recent Labs  Lab 11/24/23 0451  11/25/23 0749 11/26/23 0647 11/27/23 0608 11/28/23 0541  WBC 11.2* 6.8 6.8 6.6 7.2  HGB 10.6* 10.4* 10.7* 10.7* 10.7*  HCT 32.6* 32.7* 33.0* 33.7* 33.2*  MCV 94.8 95.6 94.8 95.5 94.3  PLT 166 145* 173 178 177   Cardiac Enzymes: No results for input(s): CKTOTAL, CKMB, CKMBINDEX, TROPONINI in the last 168 hours. BNP: Invalid input(s): POCBNP CBG: No results for input(s): GLUCAP in the last 168 hours. D-Dimer Recent Labs    11/26/23 1103  DDIMER 2.49*   Hgb A1c No results for input(s): HGBA1C in the last 72 hours. Lipid Profile No results for input(s): CHOL, HDL, LDLCALC, TRIG, CHOLHDL, LDLDIRECT in the last 72 hours. Thyroid function studies No results for input(s): TSH, T4TOTAL, T3FREE, THYROIDAB in the last 72 hours.  Invalid input(s): FREET3 Anemia work up No results for input(s): VITAMINB12, FOLATE, FERRITIN, TIBC, IRON, RETICCTPCT in the last 72 hours. Microbiology Recent Results (from the past 240 hours)  Surgical pcr screen     Status: None   Collection Time: 11/22/23  2:21 PM   Specimen: Nasal Mucosa; Nasal Swab  Result Value Ref Range Status   MRSA, PCR NEGATIVE NEGATIVE Final   Staphylococcus aureus NEGATIVE NEGATIVE Final    Comment: (NOTE) The Xpert SA Assay (FDA approved for NASAL specimens in patients 4 years of age and older), is one component of a comprehensive surveillance program. It is not intended to diagnose infection nor to guide or monitor treatment. Performed at [facility], [address] [place], [place] [address]      Discharge Instructions:   Discharge Instructions     Ambulatory referral to Orthopedic Surgery   Complete by: As directed    Family requesting second opinion   Discharge instructions   Complete by: As directed    Dys 3- nectar thick   Increase activity slowly   Complete by: As directed       Allergies as of 11/28/2023       Reactions   Short Ragweed  Pollen Ext Other (See Comments)   Unknown    Beef-derived Drug Products Other (See Comments)   Patient does not eat beef    Eliquis  [apixaban ] Itching   Lasix [furosemide] Itching   Other Other (See Comments), Hypertension   Fusid- Legs became very swollen   Tylenol [acetaminophen] Hives        Medication List     PAUSE taking these medications    candesartan 4 MG tablet Wait to take this until your doctor or other care provider tells you to start again. Commonly known as: ATACAND Take 0.5 tablets by mouth at bedtime.       STOP taking these medications    traMADol  50 MG tablet Commonly known as: ULTRAM        TAKE these medications    amoxicillin -clavulanate 875-125 MG tablet Commonly known as: AUGMENTIN  Take 1 tablet by mouth every 12 (twelve) hours.   atorvastatin  10 MG tablet Commonly known as: LIPITOR Take 5 mg by mouth at bedtime.   celecoxib  200 MG capsule Commonly known as: CELEBREX  Take 1 capsule (200 mg total) by mouth 2 (two) times daily.   feeding supplement Liqd Take 237 mLs by mouth 2 (two) times daily between meals.   ferrous sulfate 325 (65 FE) MG EC tablet Take 325 mg by mouth daily.   fluticasone  50 MCG/ACT nasal spray Commonly known as: FLONASE  Place 2 sprays  into both nostrils daily. Start taking on: November 29, 2023   lactulose  10 GM/15ML solution Commonly known as: CHRONULAC  Take 30 mLs (20 g total) by mouth once for 1 dose.   lidocaine  5 % Commonly known as: LIDODERM  Place 1 patch onto the skin daily. Remove & Discard patch within 12 hours or as directed by MD   loratadine  10 MG tablet Commonly known as: CLARITIN  Take 1 tablet (10 mg total) by mouth daily. Start taking on: November 29, 2023   methocarbamol  500 MG tablet Commonly known as: ROBAXIN  Take 1 tablet (500 mg total) by mouth 3 (three) times daily.   metolazone  2.5 MG tablet Commonly known as: ZAROXOLYN  Take 1 tablet (2.5 mg total) by mouth daily.   multivitamin  with minerals tablet Take 0.5 tablets by mouth daily.   oxyCODONE  5 MG immediate release tablet Commonly known as: Oxy IR/ROXICODONE  Take 0.5 tablets (2.5 mg total) by mouth every 3 (three) hours as needed for moderate pain (pain score 4-6) or breakthrough pain.   pantoprazole  40 MG tablet Commonly known as: PROTONIX  Take 1 tablet (40 mg total) by mouth 2 (two) times daily before a meal.   polyethylene glycol 17 g packet Commonly known as: MIRALAX  / GLYCOLAX  Take 17 g by mouth daily. Start taking on: November 29, 2023   senna-docusate 8.6-50 MG tablet Commonly known as: Senokot-S Take 1 tablet by mouth 2 (two) times daily.   sodium chloride  0.65 % Soln nasal spray Commonly known as: OCEAN Place 1 spray into both nostrils as needed for congestion.   tamsulosin  0.4 MG Caps capsule Commonly known as: FLOMAX  Take 0.4 mg by mouth at bedtime.   torsemide  20 MG tablet Commonly known as: DEMADEX  Take 1 tablet (20 mg total) by mouth daily as needed. What changed:  when to take this reasons to take this   Xarelto  15 MG Tabs tablet Generic drug: Rivaroxaban  Take 15 mg by mouth daily with supper.        Follow-up Information     Lanis Pupa, MD Follow up in 2 week(s).   Specialty: Neurosurgery Contact information: [address] [place] [address] [phone]                  Time coordinating discharge: 45 min  Signed:  Harlene RAYMOND Bowl DO  [facility] 11/28/2023, 1:26 PM

## 2023-11-28 NOTE — Progress Notes (Signed)
 Inpatient Rehab Admissions Coordinator:    I met with Pt and spoke to his wife on the phone. They want to pursue CIR. I will plan for admit today for estimated 5-7 days with the goal of d/c home with wife at mod I level.  Leita Kleine, MS, CCC-SLP Rehab Admissions Coordinator  [phone] (celll) [phone] (office)

## 2023-11-28 NOTE — H&P (Signed)
 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 Upon arrival, initial vital signs were stable. Laboratory tests revealed WBC 7.0, Hemoglobin 10.6, Platelets 170, Sodium 138, Potassium 3.9, BUN 127, Creatinine 1.38, and Bicarb 24. A CT scan of the head showed no acute cranial abnormalities but revealed a frontal parietal scalp laceration and hematoma, generalized cerebral atrophy, and no infarcts within the left cerebellar hemisphere. A CT maxillofacial scan showed no evidence of fracture, but a separate CT spine scan identified a type 2 odontoid fracture with approximately 12 mm posterior displacement relative to the body of C2, associated perivertebral soft tissue swelling, and possible epidural hematoma.   Dr. Lanis consulted was therefore requested  An MRI of the cervical spine showed sub-ligamentous hematoma but no spinal cord compression at C1-2. Conservative management was recommended, [place] -J collar to remain in place at all times. Prior to admission, Xarelto  was held, and the patient received K-centra in the ED. Cardiology was consulted for a.fib and bradycardia and indicated no need for temporary pacing as the patient remained asymptomatic. 2D echo showed EF of 70 to 75%, no regional WMA, mild mitral stenosis, moderate aortic valve stenosis, moderate dilation of ascending aorta measuring 47 mm. The patient exhibited dysarthria and swallowing deficits and was placed on a dysphagia 3 diet with nectar-thick liquids.  Patient had an MRI of his  brain completed without acute CVA.  Patient was seen by Dr. Burnetta orthopedics, recommended continued nonoperative management.  Plan for repeat x-rays in 4 weeks.  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  He currently requires CGA with rolling walker and cueing for safety, min assist to stand and CGA to min assist for ambulation.Therapy evaluations completed due to patient decreased functional mobility was admitted for a comprehensive rehab program.    Review of Systems  Constitutional:  Negative for chills and fever.  HENT:  Positive for hearing loss (chronic).   Eyes:  Negative for blurred vision and double vision.  Respiratory:  Positive for cough. Negative for shortness of breath and wheezing.   Cardiovascular:  Negative for chest pain, palpitations and leg swelling.  Gastrointestinal:  Negative for abdominal pain, constipation, diarrhea, nausea and vomiting.  Genitourinary:  Positive for frequency. Negative for dysuria, flank pain, hematuria and urgency.  Musculoskeletal:  Positive for falls, joint pain and neck pain.  Skin:        Scalp laceration   Neurological:  Positive for weakness. Negative for dizziness, tingling, tremors, seizures and headaches.  Endo/Heme/Allergies: Negative.   Psychiatric/Behavioral: Negative.     Past Medical History:  Diagnosis Date   Arthritis    Cardiac arrhythmia    Hypertension    Ruptured aortic aneurysm [facility])    Stroke (HCC) 08/23/2021   Past Surgical History:  Procedure Laterality Date   open heart surgery     ruptured aortic aneurysm repair     History reviewed. No pertinent family history. Social History:  reports that he has quit smoking. His smoking use included cigarettes. He has never used  smokeless tobacco. He reports that he does not drink alcohol and does not use drugs. Allergies:  Allergies  Allergen Reactions   Short Ragweed Pollen Ext Other (See Comments)    Unknown    Beef-Derived Drug  Products Other (See Comments)    Patient does not eat beef    Eliquis  [Apixaban ] Itching   Lasix [Furosemide] Itching   Other Other (See Comments) and Hypertension    Fusid- Legs became very swollen   Tylenol [Acetaminophen] Hives   Medications Prior to Admission  Medication Sig Dispense Refill   amoxicillin -clavulanate (AUGMENTIN ) 875-125 MG tablet Take 1 tablet by mouth every 12 (twelve) hours.     atorvastatin  (LIPITOR) 10 MG tablet Take 5 mg by mouth at bedtime.     [Paused] candesartan (ATACAND) 4 MG tablet Take 0.5 tablets by mouth at bedtime.     celecoxib  (CELEBREX ) 200 MG capsule Take 1 capsule (200 mg total) by mouth 2 (two) times daily.     feeding supplement (ENSURE PLUS HIGH PROTEIN) LIQD Take 237 mLs by mouth 2 (two) times daily between meals.     ferrous sulfate 325 (65 FE) MG EC tablet Take 325 mg by mouth daily.     [START ON 11/29/2023] fluticasone  (FLONASE ) 50 MCG/ACT nasal spray Place 2 sprays into both nostrils daily.     lactulose  (CHRONULAC ) 10 GM/15ML solution Take 30 mLs (20 g total) by mouth once for 1 dose.     lidocaine  (LIDODERM ) 5 % Place 1 patch onto the skin daily. Remove & Discard patch within 12 hours or as directed by MD     [START ON 11/29/2023] loratadine  (CLARITIN ) 10 MG tablet Take 1 tablet (10 mg total) by mouth daily.     methocarbamol  (ROBAXIN ) 500 MG tablet Take 1 tablet (500 mg total) by mouth 3 (three) times daily.     metolazone  (ZAROXOLYN ) 2.5 MG tablet Take 1 tablet (2.5 mg total) by mouth daily. 90 tablet 3   Multiple Vitamins-Minerals (MULTIVITAMIN WITH MINERALS) tablet Take 0.5 tablets by mouth daily.     oxyCODONE  (OXY IR/ROXICODONE ) 5 MG immediate release tablet Take 0.5 tablets (2.5 mg total) by mouth every 3 (three) hours as needed for moderate pain (pain score 4-6) or breakthrough pain.     pantoprazole  (PROTONIX ) 40 MG tablet Take 1 tablet (40 mg total) by mouth 2 (two) times daily before a meal.     [START ON 11/29/2023] polyethylene glycol  (MIRALAX  / GLYCOLAX ) 17 g packet Take 17 g by mouth daily.     Rivaroxaban  (XARELTO ) 15 MG TABS tablet Take 15 mg by mouth daily with supper.     senna-docusate (SENOKOT-S) 8.6-50 MG tablet Take 1 tablet by mouth 2 (two) times daily.     sodium chloride  (OCEAN) 0.65 % SOLN nasal spray Place 1 spray into both nostrils as needed for congestion.     tamsulosin  (FLOMAX ) 0.4 MG CAPS capsule Take 0.4 mg by mouth at bedtime.     torsemide  (DEMADEX ) 20 MG tablet Take 1 tablet (20 mg total) by mouth daily as needed.        Home: Home Living Family/patient expects to be discharged to:: Private residence Living Arrangements: Spouse/significant other Available Help at Discharge: Family, Available 24 hours/day Type of Home: House Home Access: Stairs to enter [employer] of Steps: 3 steps Entrance Stairs-Rails: Left, Right, Can reach both Home Layout: Bed/bath upstairs, Multi-level Alternate Level Stairs-Number of Steps: chair lift to get upstairs to bedroom Bathroom Shower/Tub: Tub/shower unit [employer]  Toilet: Standard Bathroom Accessibility: Yes Home Equipment: Agricultural consultant (2 wheels), [employer] - quad, Information systems manager, BSC/3in1, Rollator (4 wheels) Additional Comments: aide drives pt to errands, exercises   Functional History: Prior Function Prior Level of Function : Needs assist Mobility Comments: ambulation with RW upstairs and rollator downstairs ADLs Comments: aide assists with LB dressing, driving, and entertainment during the day.  Functional Status:  Mobility: Bed Mobility Overal bed mobility: Needs Assistance Bed Mobility: Rolling, Sit to Sidelying Rolling: Mod assist, Used rails Sidelying to sit: Mod assist Sit to sidelying: Mod assist General bed mobility comments: in recliner Transfers Overall transfer level: Needs assistance Equipment used: Rolling walker (2 wheels) Transfers: Sit to/from Stand Sit to Stand: Min assist General transfer comment: assist for  balance Ambulation/Gait Ambulation/Gait assistance: Contact guard assist, Min assist Gait Distance (Feet): 150 Feet Assistive device: Rolling walker (2 wheels) Gait Pattern/deviations: Step-through pattern, Decreased stride length, Shuffle, Decreased dorsiflexion - left, Decreased dorsiflexion - right General Gait Details: CGA to min A at times for balance with turns and for walker proximity Gait velocity: decreased Gait velocity interpretation: <1.31 ft/sec, indicative of household ambulator    ADL: ADL Overall ADL's : Needs assistance/impaired Eating/Feeding: Set up, Sitting Grooming: Minimal assistance, Standing Grooming Details (indicate cue type and reason): intermittent A for balance, cues for compensatory technique to maintain spinal precations Upper Body Bathing: Minimal assistance, Sitting Lower Body Bathing: Moderate assistance, Sit to/from stand Upper Body Dressing : Minimal assistance, Sitting Lower Body Dressing: Moderate assistance, Sit to/from stand Lower Body Dressing Details (indicate cue type and reason): pt continues to attempt to bend to BLE; able to pull up but not don sock within precautions Toilet Transfer: Contact guard assist, Ambulation, Rolling walker (2 wheels), Minimal assistance Toilet Transfer Details (indicate cue type and reason): 1x assts for balance Toileting- Clothing Manipulation and Hygiene: Supervision/safety, Sitting/lateral lean Functional mobility during ADLs: Contact guard assist, Rolling walker (2 wheels), Cueing for safety  Cognition: Cognition Orientation Level: Oriented X4 Cognition Arousal: Alert Behavior During Therapy: WFL for tasks assessed/performed  Physical Exam: Blood pressure 121/66, pulse 60, temperature 97.6 F (36.4 C), temperature source Oral, resp. rate 20, height 5' 8 (1.727 m), weight 90.4 kg, SpO2 90%. Physical Exam  General: No apparent distress. Pt wife at bedside HEENT: Head is normocephalic, dried blood /  scalp laceration, sclera anicteric, oral mucosa pink and moist, hearing aids in place Neck: Supple without JVD or lymphadenopathy Heart: Reg rate and rhythm. Chest: CTA bilaterally without wheezes, rales, or rhonchi; no distress, 3L o2 [place]. Occasional cough Abdomen: Soft, non-tender, non-distended, bowel sounds positive. Extremities: No clubbing, cyanosis, or edema. Pulses are 2+ Psych: Pt's affect is appropriate. Pt is cooperative GU: External catheter draining yellow urine Skin: Clean and intact without signs of breakdown Swelling anterior to L shoulder- Chronic Lipoma Neuro:     Mental Status: AAOx3, drowsy Speech/Languate: Naming and repetition intact, fluent, follows simple commands, altered speech- difficulty pronouncing some syllables  CRANIAL NERVES: II: PERRL. Visual fields full III, IV, VI: EOM intact, no gaze preference or deviation V: normal sensation bilaterally VII: no asymmetry VIII: Hard of hearing IX, X: normal palatal elevation XI: 5/5 head turn and 5/5 shoulder shrug bilaterally XII: Tongue midline     MOTOR: RUE: 2/5 Deltoid, 4/5 Biceps, 4/5 Triceps,4/5 Grip LUE: 2/5 Deltoid, 4/5 Biceps, 4/5 Triceps, 4/5 Grip RLE: HF 4/5, KE 4/5, ADF 4/5, APF 4/5 LLE: HF 4/5, KE 4/5, ADF 4/5, APF 4/5     SENSORY: Normal to touch  all 4 extremities   Coordination: No ataxia or dsymetria noted   MSK: pain with b/l shoulder passive abduction, crepitus noted. 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, [place], [place] [address]  Renal function panel     Status: Abnormal   Collection Time: 11/27/23  6:08 AM  Result Value Ref Range   Sodium 140 135 - 145 mmol/L   Potassium 4.3 3.5 - 5.1 mmol/L   Chloride 107 98 - 111 mmol/L   CO2 23 22 - 32 mmol/L   Glucose, Bld 116 (H) 70 - 99 mg/dL    Comment: Glucose reference range applies only to samples taken after fasting for at least 8 hours.   BUN 35 (H) 8 - 23 mg/dL   Creatinine, Ser 8.69 (H) 0.61 - 1.24 mg/dL   Calcium  9.6 8.9 - 10.3 mg/dL   Phosphorus 3.8 2.5 - 4.6 mg/dL   Albumin 2.9 (L) 3.5 - 5.0 g/dL   GFR, Estimated 51 (L) >60 mL/min    Comment: (NOTE) Calculated using the CKD-EPI Creatinine Equation (2021)    Anion gap 10 5 - 15    Comment: Performed at [facility], [address], [place], [place] [address]  CBC     Status: Abnormal   Collection Time: 11/28/23  5:41 AM  Result Value Ref Range   WBC 7.2 4.0 - 10.5 K/uL   RBC 3.52 (L) 4.22 - 5.81 MIL/uL   Hemoglobin 10.7 (L) 13.0 - 17.0 g/dL   HCT 66.7 (L) 60.9 - 47.9 %   MCV 94.3 80.0 - 100.0 fL   MCH 30.4 26.0 - 34.0 pg   MCHC 32.2 30.0 - 36.0 g/dL   RDW 85.9 88.4 - 84.4 %   Platelets 177 150 - 400 K/uL   nRBC 0.0 0.0 - 0.2 %    Comment: Performed at [facility], [address], [place], [place] [address]  Basic metabolic panel     Status: Abnormal   Collection Time: 11/28/23  5:41 AM  Result Value Ref Range   Sodium 141 135 - 145 mmol/L   Potassium 4.0 3.5 - 5.1 mmol/L   Chloride 107 98 - 111 mmol/L   CO2 23 22 - 32 mmol/L   Glucose, Bld 114 (H) 70 - 99 mg/dL    Comment: Glucose reference range applies only to samples taken after fasting for at least 8 hours.   BUN 42 (H) 8 - 23 mg/dL   Creatinine, Ser 8.47 (H) 0.61 - 1.24 mg/dL   Calcium  9.5 8.9 - 10.3 mg/dL   GFR, Estimated 42 (L) >60 mL/min    Comment: (NOTE) Calculated using the CKD-EPI Creatinine Equation (2021)  Anion gap 11 5 - 15    Comment: Performed at [facility], [address], [place], [place] [address]   DG Cervical Spine 2 or 3 views Result Date: 11/28/2023 CLINICAL DATA:  Fracture. EXAM: CERVICAL SPINE - 2-3 VIEW COMPARISON:  CT 11/22/2023 FINDINGS: Displaced dens fracture with 12 mm posterior displacement of the proximal fracture fragment. No significant change in alignment from prior exam. The C1 fractures on prior CT are not well demonstrated by radiograph. Similar anterolisthesis of C4 on C5 and C5 on C6. Prevertebral soft tissue thickening is seen at the fracture site. AP views are limited due to osseous overlap. IMPRESSION: 1. Displaced dens fracture with 12 mm posterior displacement of the proximal fracture fragment. No significant change in alignment from prior exam. 2. C1 fractures on prior CT are not well demonstrated by radiograph. Electronically Signed   By: Andrea Gasman M.D.   On: 11/28/2023 14:24      Blood pressure 121/66, pulse 60, temperature 97.6 F (36.4 C), temperature source Oral, resp. rate 20, height 5' 8 (1.727 m), weight 90.4 kg, SpO2 90%.  Medical Problem List and Plan: 1. Functional deficits secondary to type II posterior displaced odontoid fracture after a fall  -patient may shower  -ELOS/Goals: 5-7 days, PT/OT/SLP Mod I   -Pts wife would like to be notified before any medication changes 2.  Antithrombotics: -DVT/anticoagulation:  Mechanical: Sequential compression devices, below knee Bilateral lower extremities Pharmaceutical: Xarelto   -antiplatelet therapy: N/A 3. Pain Management: Celebrex  (monitor Cr) Robaxin , oxycodone  as needed  - Left knee pain-received cortisone injection by orthopedics 4. Mood/Behavior/Sleep: LCSW to follow for evaluation and support when available.   -antipsychotic agents: N/A 5. Neuropsych/cognition: This patient is capable of making decisions on his own behalf. 6. Skin/Wound Care: routine pressure relief measures -[place]  J collar in place, monitor for changes in character of skin  -scalp laceration present on admission  7. Fluids/Electrolytes/Nutrition: monitor I/O and repeat labs in a.m.   - Dysphagia 3 with nectar thick liquids--continue aspiration precautions. Continue SLP 8. HTN: resumed home irbesartan --no BB due to bradycardia 9.  Chronic diastolic CHF/chronic lower extremity edema: patient's wife requested metolazone  and not Demadex .   -Currently on metolazone  2.5 mg daily, BNP 261-- wife says this CAN NOT BE REMOVED  10. HLD: Continue home atorvastatin  11.  Mild anemia: Continue to monitor CBC recheck labs in a.m. 12.  AKI: Creatinine 1.38 on admission baseline approximately 1.0--trend creatinine. Last Cr 1.52. Recheck tomorrow. 13.  A-fib with bradycardia: Xarelto  resumed. 14. Obesity  Body mass index is 30.3 kg/m. 15. Bradycardia  -Pt was evaluated by cardiology, no indication for temporary pacing 16. Hx of aortic aneurysm/dissection -f/u outpatient, BP control   17. Aortic stenosis  -Repeat echo 6-12 months has been recommended 18. L knee pain  -L knee injection completed by ortho     Daphne LITTIE Finders, NP 11/28/2023  I have personally performed a face to face diagnostic evaluation of this patient and formulated the key components of the plan.  Additionally, I have personally reviewed laboratory data, imaging studies, as well as relevant notes and concur with the physician assistant's documentation above.  The patient's status has not changed from the original H&P.  Any changes in documentation from the acute care chart have been noted above.  Murray Collier, MD

## 2023-11-28 NOTE — Consult Note (Signed)
 History:  Zachary Underwood is a 89 y.o. male  Seen in consultation after presenting to the emergency department after a fall. Patient is amnestic to the events, however does report that after this fall, he had severe neck, pain, and some bleeding from his forehead. Upon his arrival to the emergency department has his work up, included CT scan of the head, which demonstrated a displaced Odontoid Fracture. Neurosurgical consultation was therefore requested. Currently, the patient does not report any numbness, tingling or weakness of the extremities. He does, however continue to complain of relatively severe neck pain.    Of note, the patient has a history of medically, controlled, hypertension, history of ruptured thoracic aortic aneurysm, and atrial fibrillation currently on Xarelto . In addition, he does have chronic kidney disease. He has a history of stroke. No history of heart attack. No no lung or liver disease. He does apparently have a diagnosis of osteopenia. No cancer history. He is a non-smoker.  Past Medical History:  Diagnosis Date   Arthritis    Cardiac arrhythmia    Hypertension    Ruptured aortic aneurysm (HCC)    Stroke (HCC) 08/23/2021    Allergies  Allergen Reactions   Short Ragweed Pollen Ext Other (See Comments)    Unknown    Beef-Derived Drug Products Other (See Comments)    Patient does not eat beef    Eliquis  [Apixaban ] Itching   Lasix [Furosemide] Itching   Other Other (See Comments) and Hypertension    Fusid- Legs became very swollen   Tylenol [Acetaminophen] Hives    No current facility-administered medications on file prior to encounter.   Current Outpatient Medications on File Prior to Encounter  Medication Sig Dispense Refill   amoxicillin -clavulanate (AUGMENTIN ) 875-125 MG tablet Take 1 tablet by mouth every 12 (twelve) hours.     atorvastatin  (LIPITOR) 10 MG tablet Take 5 mg by mouth at bedtime.     [Paused] candesartan (ATACAND) 4 MG tablet Take 0.5  tablets by mouth at bedtime.     celecoxib  (CELEBREX ) 200 MG capsule Take 1 capsule (200 mg total) by mouth 2 (two) times daily.     feeding supplement (ENSURE PLUS HIGH PROTEIN) LIQD Take 237 mLs by mouth 2 (two) times daily between meals.     ferrous sulfate 325 (65 FE) MG EC tablet Take 325 mg by mouth daily.     [START ON 11/29/2023] fluticasone  (FLONASE ) 50 MCG/ACT nasal spray Place 2 sprays into both nostrils daily.     lactulose  (CHRONULAC ) 10 GM/15ML solution Take 30 mLs (20 g total) by mouth once for 1 dose.     lidocaine  (LIDODERM ) 5 % Place 1 patch onto the skin daily. Remove & Discard patch within 12 hours or as directed by MD     [START ON 11/29/2023] loratadine  (CLARITIN ) 10 MG tablet Take 1 tablet (10 mg total) by mouth daily.     methocarbamol  (ROBAXIN ) 500 MG tablet Take 1 tablet (500 mg total) by mouth 3 (three) times daily.     metolazone  (ZAROXOLYN ) 2.5 MG tablet Take 1 tablet (2.5 mg total) by mouth daily. 90 tablet 3   Multiple Vitamins-Minerals (MULTIVITAMIN WITH MINERALS) tablet Take 0.5 tablets by mouth daily.     oxyCODONE  (OXY IR/ROXICODONE ) 5 MG immediate release tablet Take 0.5 tablets (2.5 mg total) by mouth every 3 (three) hours as needed for moderate pain (pain score 4-6) or breakthrough pain.     pantoprazole  (PROTONIX ) 40 MG tablet Take 1 tablet (40  mg total) by mouth 2 (two) times daily before a meal.     [START ON 11/29/2023] polyethylene glycol (MIRALAX  / GLYCOLAX ) 17 g packet Take 17 g by mouth daily.     Rivaroxaban  (XARELTO ) 15 MG TABS tablet Take 15 mg by mouth daily with supper.     senna-docusate (SENOKOT-S) 8.6-50 MG tablet Take 1 tablet by mouth 2 (two) times daily.     sodium chloride  (OCEAN) 0.65 % SOLN nasal spray Place 1 spray into both nostrils as needed for congestion.     tamsulosin  (FLOMAX ) 0.4 MG CAPS capsule Take 0.4 mg by mouth at bedtime.     torsemide  (DEMADEX ) 20 MG tablet Take 1 tablet (20 mg total) by mouth daily as needed.      Physical  Exam: Patient is currently in bed.  He is able to stand and ambulate with the use of a walker. He is alert and oriented x 3. He has no shortness of breath or chest pain at present. Abdomen is soft and nontender.  No incontinence of bowel and bladder.  No rebound tenderness. He has multiple soft tissue abrasions in the upper and lower extremities secondary to his fall.  He has a significant laceration on his head which has been addressed Neurological exam: 5/5 motor strength in the upper and lower extremity bilaterally.  Negative Babinski test, negative Hoffman test, no clonus.  Sensation to light touch is grossly intact in all 4 extremities.  Pupils are equal round and reactive to light and accommodation.  He is somewhat hard of hearing currently wearing hearing aids.  The patient is able to communicate without slurring his speech.  No facial asymmetry is noted. He has full range of motion in the upper and lower extremity with no gross deformity or pain. 2+ dorsalis pedis/posterior BLS pulses bilaterally.  Compartments are soft and nontender.   Image: MRI CERVICAL SPINE WITHOUT CONTRAST 11/22/2023 04:20:45 PM TECHNIQUE: Multiplanar multisequence MRI of the cervical spine was performed without the administration of intravenous contrast. COMPARISON: CT cervical spine earlier same day. CLINICAL HISTORY: Better assess seen fracture on CT as well as the hematoma. Pt given morphine  before exam. Difficult time laying flat, in a lot of pain. Propeller sequences ran as repeats. Best images possible. FINDINGS: BONES AND ALIGNMENT: Displaced type 2 odontoid fracture with significant posterior dislocation of the dens relative to the body of C2, similar to the earlier CT. Edema and possible blood products are interposed between the dens and posterior superior body of C2. Displacement of the fracture site indents the ventral thecal sac at C1-2. Vertebral body heights are otherwise maintained. No suspicious osseous  lesion. SPINAL CORD: Flattening of the ventral cervical cord without evidence of compression. CSF flow artifact along the ventral aspect of the cervical cord at C1-2, compatible with significant stenosis. SOFT TISSUES: Significant prevertebral soft tissue swelling extending from the skull base to the level of C7-T1. Edema within the paraspinal musculature extending from the skull base to the level of C6. C2-C3: Additional disruption of the posterior longitudinal ligament along the posterior aspect of C2. Possible disruption of the ligamentum flavum at C2-3 and C3-4. Edema along the posterior atlantoaxial membrane suggestive of ligamentous injury. C3-C4: Possible disruption of the ligamentum flavum at C3-4. C4-C5: No significant disc herniation. No spinal canal stenosis or neural foraminal narrowing. C5-C6: Disc osteophyte complex which indents the ventral thecal sac, contributing to mild spinal canal stenosis. C6-C7: Disc osteophyte complex which indents the ventral thecal sac. Facet arthrosis. C7-T1:  No significant disc herniation. No spinal canal stenosis or neural foraminal narrowing. SPINAL CANAL STENOSIS: Moderate spinal canal stenosis at C1-2, best appreciated on sagittal images with limited evaluation on axial images due to positioning and motion artifact. Additional mild spinal canal stenosis at C5-6. IMPRESSION: 1. Displaced type 2 odontoid fracture with significant posterior dislocation of the dens relative to the body of C2. Associated edema and possible blood products interposed between dens and C2. No definite epidural hematoma. 2. Displacement of dens as well as malalignment of the bilateral atlantoaxial articulations results in moderate spinal canal stenosis and ventral cervical cord flattening. 3. Disruption of the anterior longitudinal ligament and posterior longitudinal ligament at C2. Edema along the posterior atlantoaxial membrane suggestive of ligamentous injury. Possible disruption of the  ligamentum flavum at C2-3 and C3-4. 4. C1 fractures better seen on CT. 5. Significant prevertebral edema. 6. Disc osteophyte complexes at C5-6 contributing to mild spinal canal stenosis.  Displaced type 2 odontoid fracture with significant posterior dislocation of the dens relative to the body of C2, similar to the earlier CT. Edema and possible blood products are interposed between the dens and posterior superior body of C2. Displacement of the fracture site indents the ventral thecal sac at C1-2. Vertebral body heights are otherwise maintained. No suspicious osseous lesion. SPINAL CORD: Flattening of the ventral cervical cord without evidence of compression. CSF flow artifact along the ventral aspect of the cervical cord at C1-2, compatible with significant stenosis. SOFT TISSUES: Significant prevertebral soft tissue swelling extending from the skull base to the level of C7-T1. Edema within the paraspinal musculature extending from the skull base to the level of C6. C2-C3: Additional disruption of the posterior longitudinal ligament along the posterior aspect of C2. Possible disruption of the ligamentum flavum at C2-3 and C3-4. Edema along the posterior atlantoaxial membrane suggestive of ligamentous injury. C3-C4: Possible disruption of the ligamentum flavum at C3-4. C4-C5: No significant disc herniation. No spinal canal stenosis or neural foraminal narrowing. C5-C6: Disc osteophyte complex which indents the ventral thecal sac, contributing to mild spinal canal stenosis. C6-C7: Disc osteophyte complex which indents the ventral thecal sac. Facet arthrosis. C7-T1: No significant disc herniation. No spinal canal stenosis or neural foraminal narrowing. SPINAL CANAL STENOSIS: Moderate spinal canal stenosis at C1-2, best appreciated on sagittal images with limited evaluation on axial images due to positioning and motion artifact. Additional mild spinal canal stenosis at C5-6. IMPRESSION: 1. Displaced type 2 odontoid  fracture with significant posterior dislocation of the dens relative to the body of C2. Associated edema and possible blood products interposed between dens and C2. No definite epidural hematoma. 2. Displacement of dens as well as malalignment of the bilateral atlantoaxial articulations results in moderate spinal canal stenosis and ventral cervical cord flattening. 3. Disruption of the anterior longitudinal ligament and posterior longitudinal ligament at C2. Edema along the posterior atlantoaxial membrane suggestive of ligamentous injury. Possible disruption of the ligamentum flavum at C2-3 and C3-4. 4. C1 fractures better seen on CT. 5. Significant prevertebral edema.   CT CERVICAL SPINE WITHOUT CONTRAST 11/22/2023 01:07:00 PM TECHNIQUE: CT of the cervical spine was performed without the administration of intravenous contrast. Multiplanar reformatted images are provided for review. Automated exposure control, iterative reconstruction, and/or weight based adjustment of the mA/kV was utilized to reduce the radiation dose to as low as reasonably achievable. COMPARISON: CTA head and neck dated 08/16/2023. CLINICAL HISTORY: Polytrauma, blunt. Attempting to get in vehicle with caregiver. Mechanical fall while trying to get in vehicle.  Hit head. Laceration to head. Complains of pain in the back of neck. On Xarelto . FINDINGS: CERVICAL SPINE: BONES AND ALIGNMENT: There is a displaced fracture through the base of the dens with approximately 12 mm posterior displacement relative to the body of C2. The dens remains articulated with the anterior arch of C1. There are additional mildly displaced fractures through the posterior arch of C1 along the right and left posterolateral aspects. There is likely an additional nondisplaced fracture through the midline anterior arch of C1. Lucency within the left lateral mass of C2 extending into the left articular pillar favored to reflect a nutrient vessel foramen baseline appearance on  coronal images. Cervical lordosis is maintained. Similar anterolisthesis of C5 on C6 likely related to degenerative changes. There is no compression fracture in the cervical spine. DEGENERATIVE CHANGES: Facet arthrosis and uncovertebral hypertrophy at multiple levels in the cervical spine. SOFT TISSUES: Soft tissue surrounding the dens appears increased from prior and may reflect component of hematoma. There is prevertebral soft tissue swelling extending from C1 to C3-4 possible hematoma and fracture displacement results in at least moderate spinal canal stenosis at C1-2. The paraspinal musculature is unremarkable. IMPRESSION: 1. Displaced type II odontoid fracture with approximately 12 mm posterior displacement relative to the body of C2. Associated prevertebral soft tissue swelling and possible epidural hematoma. There is at least moderate spinal canal stenosis at C1-2. Recommend MRI cervical spine for further evaluation. 2. Additional mildly displaced fractures through the posterior arch of C1 and an additional nondisplaced fracture through the midline anterior arch of C1. 3. Similar anterolisthesis of C5 on C6 likely related to degenerative changes.  MRI BRAIN WITHOUT CONTRAST 11/26/2023 03:40:00 AM TECHNIQUE: Multiplanar multisequence MRI of the head/brain was performed without the administration of intravenous contrast. COMPARISON: 07/24/2023 brain MRI 11/22/2023 CT cervical spine CLINICAL HISTORY: Neuro deficit, acute, stroke suspected. FINDINGS: BRAIN AND VENTRICLES: Old left cerebellar infarct. No acute infarct. No intracranial hemorrhage. No mass. No midline shift. No hydrocephalus. The sella is unremarkable. Normal flow voids. Minimal nonspecific white matter T2-weighted signal hyperintensities, which may be associated with early chronic small vessel disease or migraine headaches. Normal volume loss. ORBITS: Ocular lens replacements. No acute abnormality. SINUSES AND MASTOIDS: No acute abnormality. BONES  AND SOFT TISSUES: Known C2 fracture with posterior displacement of the dens relative to the C2 body. No acute soft tissue abnormality. IMPRESSION: 1. No acute intracranial abnormality. 2. Old left cerebellar infarct. 3. Minimal nonspecific white matter T2-weighted signal hyperintensities, possibly related to chronic small vessel disease 4. Known C2 fracture    CERVICAL SPINE - 2-3 VIEW COMPARISON:  CT 11/22/2023 FINDINGS: Displaced dens fracture with 12 mm posterior displacement of the proximal fracture fragment. No significant change in alignment from prior exam. The C1 fractures on prior CT are not well demonstrated by radiograph. Similar anterolisthesis of C4 on C5 and C5 on C6. Prevertebral soft tissue thickening is seen at the fracture site. AP views are limited due to osseous overlap. IMPRESSION: 1. Displaced dens fracture with 12 mm posterior displacement of the proximal fracture fragment. No significant change in alignment from prior exam. 2. C1 fractures on prior CT are not well demonstrated by radiograph   A/P: Calen is a very pleasant active 89 year old gentleman who was in his usual state of health until he unfortunately fell on 11/22/2023.  Patient had superficial abrasions and a scalp laceration and was complaining of significant neck pain.  Patient was evaluated in the emergency room and imaging demonstrated type II displaced  odontoid fracture as well as C1 fracture.  The patient was evaluated and placed in a hard cervical collar.  At the request of the family I was asked to provide a second opinion concerning management of the fracture.  On clinical exam, the patient is neurologically intact and demonstrates no signs or symptoms of myelopathy.  Imaging clearly demonstrates a displaced C2 dens fracture creating moderate to severe stenosis.  There does not appear to be any cord signal changes.  In addition the patient is on Xarelto  and there is evidence of bleeding but no significant epidural  hematoma causing additional compression.  I have reviewed the images and his clinical findings with the patient and his wife.  All of their questions were addressed.  Given his multiple medical issues, generalized osteoporosis, and his age I agree that nonoperative management is probably the best option.  Surgical management would entail either an anterior ORIF or a posterior occipital cervical fusion.  I do not think surgery would provide a positive predictable outcome given his age and medical history.  I do think that at minimum 3 months in the collar could provide the best chance that a cartilaginous union.  I do not think he will get a solid bony fusion given the displacement of the fragment.  I did discuss with the patient and his wife that the fracture fragment can displace further and cause a catastrophic spinal cord injury which would significantly increase his morbidity/mortality.  Despite the potential risk I believe nonoperative therapy is still best.  Recommendations: Hard cervical collar at all times.  I will reevaluate the fracture with x-rays in 4 weeks.  Recommend he continue to use Celebrex  and muscle relaxer as this is controlling his pain.  If any questions or concerns arise I will be happy to address them.  Otherwise I will have him follow-up with me in 4 weeks.

## 2023-11-28 NOTE — Plan of Care (Signed)
  Problem: Education: Goal: Knowledge of General Education information will improve Description: Including pain rating scale, medication(s)/side effects and non-pharmacologic comfort measures 11/28/2023 0500 by Margrette Reine POUR, RN Outcome: Progressing 11/28/2023 0458 by Margrette Reine POUR, RN Outcome: Progressing   Problem: Health Behavior/Discharge Planning: Goal: Ability to manage health-related needs will improve 11/28/2023 0500 by Margrette Reine POUR, RN Outcome: Progressing 11/28/2023 0458 by Margrette Reine POUR, RN Outcome: Progressing   Problem: Clinical Measurements: Goal: Ability to maintain clinical measurements within normal limits will improve 11/28/2023 0500 by Margrette Reine POUR, RN Outcome: Progressing 11/28/2023 0458 by Margrette Reine POUR, RN Outcome: Progressing Goal: Will remain free from infection 11/28/2023 0500 by Margrette Reine POUR, RN Outcome: Progressing 11/28/2023 0458 by Margrette Reine POUR, RN Outcome: Progressing Goal: Diagnostic test results will improve 11/28/2023 0500 by Margrette Reine POUR, RN Outcome: Progressing 11/28/2023 0458 by Margrette Reine POUR, RN Outcome: Progressing Goal: Respiratory complications will improve 11/28/2023 0500 by Margrette Reine POUR, RN Outcome: Progressing 11/28/2023 0458 by Hava Massingale K, RN Outcome: Progressing Goal: Cardiovascular complication will be avoided 11/28/2023 0500 by Burle Kwan K, RN Outcome: Progressing 11/28/2023 0458 by Mulki Roesler K, RN Outcome: Progressing   Problem: Activity: Goal: Risk for activity intolerance will decrease 11/28/2023 0500 by Margrette Reine POUR, RN Outcome: Progressing 11/28/2023 0458 by Margrette Reine POUR, RN Outcome: Progressing   Problem: Nutrition: Goal: Adequate nutrition will be maintained 11/28/2023 0500 by Margrette Reine POUR, RN Outcome: Progressing 11/28/2023 0458 by Margrette Reine POUR, RN Outcome: Progressing   Problem: Coping: Goal: Level of anxiety will decrease 11/28/2023 0500 by Margrette Reine POUR, RN Outcome: Progressing 11/28/2023 0458 by Margrette Reine POUR, RN Outcome: Progressing   Problem: Elimination: Goal: Will not experience complications related to bowel motility 11/28/2023 0500 by Margrette Reine POUR, RN Outcome: Progressing 11/28/2023 0458 by Margrette Reine POUR, RN Outcome: Progressing Goal: Will not experience complications related to urinary retention 11/28/2023 0500 by Margrette Reine POUR, RN Outcome: Progressing 11/28/2023 0458 by Margrette Reine POUR, RN Outcome: Progressing   Problem: Pain Managment: Goal: General experience of comfort will improve and/or be controlled 11/28/2023 0500 by Alechia Lezama K, RN Outcome: Progressing 11/28/2023 0458 by Margrette Reine POUR, RN Outcome: Progressing   Problem: Safety: Goal: Ability to remain free from injury will improve 11/28/2023 0500 by Margrette Reine POUR, RN Outcome: Progressing 11/28/2023 0458 by Margrette Reine POUR, RN Outcome: Progressing   Problem: Skin Integrity: Goal: Risk for impaired skin integrity will decrease 11/28/2023 0500 by Margrette Reine POUR, RN Outcome: Progressing 11/28/2023 0458 by Marvena Tally K, RN Outcome: Progressing   Problem: Activity: Goal: Ability to tolerate increased activity will improve 11/28/2023 0500 by Margrette Reine POUR, RN Outcome: Progressing 11/28/2023 0458 by Margrette Reine POUR, RN Outcome: Progressing   Problem: Cardiac: Goal: Ability to achieve and maintain adequate cardiopulmonary perfusion will improve 11/28/2023 0459 by Margrette Reine POUR, RN Outcome: Completed/Met 11/28/2023 0458 by Nazaria Riesen K, RN Outcome: Progressing

## 2023-11-28 NOTE — Progress Notes (Signed)
 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 He has ongoing concerns about the imprecision of his speech.  We discussed the impact of his generalized weakness on clarity.  We reviewed swallowing precautions, particularly using an effortful swallow and f/u dry swallows to reduce the residue that is present due to weakness. Required occasional verbal cues to execute.  Consumed sips of nectar thick liquid with no overt s/s of aspiration.  Recommend continuing current diet with precautions; crush meds to prevent lodging of pills in valleculae (observed during MBS study).   SLP will continue to follow while admitted to acute care. D/W Dr. Vann.    HPI HPI: Zachary Underwood is an active, still-working 89 y.o. male who presented to the ED for evaluation after he fell and hit his head and neck. CT head and maxillofacial negative for acute changes. CT cervical spine: mildly displaced fractures through the posterior arch of C1 and nondisplaced fracture through the midline anterior arch of C1. MR cervical spine: Displaced type 2 odontoid fracture with significant posterior dislocation of the dens relative to the body of C2, osteophyte at C5-6. MBS 11/25/23 -dysphagia 3, nectar liquids with precautions recommended. PMH: persistent atrial fibrillation on Xarelto , CVA, moderate aortic stenosis, s/p surgical repair of ascending thoracic aorta, HTN, HLD, BPH, chronic lower extremity edema.      SLP Plan  Continue with current plan of care          Recommendations  Diet recommendations: Dysphagia 3 (mechanical soft);Nectar-thick liquid Liquids provided via: Cup;Straw Medication Administration: Crushed with puree Supervision: Staff to assist  with self feeding Compensations: Slow rate;Small sips/bites;Effortful swallow;Multiple dry swallows after each bite/sip Postural Changes and/or Swallow Maneuvers: Seated upright 90 degrees;Upright 30-60 min after meal                  Oral care BID     Dysphagia, oropharyngeal phase (R13.12)     Continue with current plan of care    Maddison Kilner L. Vona, MA CCC/SLP Clinical Specialist - Acute Care SLP Acute Rehabilitation Services Office number [phone]  [name]  11/28/2023, 10:33 AM

## 2023-11-28 NOTE — H&P (Addendum)
 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 Upon arrival, initial vital signs were stable. Laboratory tests revealed WBC 7.0, Hemoglobin 10.6, Platelets 170, Sodium 138, Potassium 3.9, BUN 127, Creatinine 1.38, and Bicarb 24. A CT scan of the head showed no acute cranial abnormalities but revealed a frontal parietal scalp laceration and hematoma, generalized cerebral atrophy, and no infarcts within the left cerebellar hemisphere. A CT maxillofacial scan showed no evidence of fracture, but a separate CT spine scan identified a type 2 odontoid fracture with approximately 12 mm posterior displacement relative to the body of C2, associated perivertebral soft tissue swelling, and possible epidural hematoma.    Dr. Lanis consulted was therefore requested  An MRI of the cervical spine showed sub-ligamentous hematoma but no spinal cord compression at C1-2. Conservative management was recommended, [place] -J collar to remain in place at all times. Prior to admission, Xarelto  was held, and the patient received K-centra in the ED. Cardiology was consulted for a.fib and bradycardia and indicated no need for temporary pacing as the patient remained asymptomatic. 2D echo showed EF of 70 to 75%, no regional WMA, mild mitral stenosis, moderate aortic valve stenosis, moderate dilation of ascending aorta measuring 47 mm. The patient exhibited dysarthria and swallowing deficits and was placed on a dysphagia 3 diet with nectar-thick liquids.  Patient had an MRI of  his brain completed without acute CVA.  Patient was seen by Dr. Burnetta orthopedics, recommended continued nonoperative management.  Plan for repeat x-rays in 4 weeks.  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  He currently requires CGA with rolling walker and cueing for safety, min assist to stand and CGA to min assist for ambulation.Therapy evaluations completed due to patient decreased functional mobility was admitted for a comprehensive rehab program.      Review of Systems  Constitutional:  Negative for chills and fever.  HENT:  Positive for hearing loss (chronic).   Eyes:  Negative for blurred vision and double vision.  Respiratory:  Positive for cough. Negative for shortness of breath and wheezing.   Cardiovascular:  Negative for chest pain, palpitations and leg swelling.  Gastrointestinal:  Negative for abdominal pain, constipation, diarrhea, nausea and vomiting.  Genitourinary:  Positive for frequency. Negative for dysuria, flank pain, hematuria and urgency.  Musculoskeletal:  Positive for falls, joint pain and neck pain.  Skin:        Scalp laceration   Neurological:  Positive for weakness. Negative for dizziness, tingling, tremors, seizures and headaches.  Endo/Heme/Allergies: Negative.   Psychiatric/Behavioral: Negative.         Past Medical History:  Diagnosis Date   Arthritis     Cardiac arrhythmia     Hypertension     Ruptured aortic aneurysm [facility])     Stroke (HCC) 08/23/2021         Past Surgical History:  Procedure Laterality Date   open heart surgery       ruptured aortic aneurysm repair  History reviewed. No pertinent family history.     Social History:  reports that he has quit smoking. His smoking use included cigarettes. He has never used smokeless tobacco. He reports that he does not drink alcohol and does not use drugs. Allergies:  Allergies       Allergies  Allergen Reactions   Short Ragweed Pollen Ext  Other (See Comments)      Unknown    Beef-Derived Drug Products Other (See Comments)      Patient does not eat beef    Eliquis  [Apixaban ] Itching   Lasix [Furosemide] Itching   Other Other (See Comments) and Hypertension      Fusid- Legs became very swollen   Tylenol [Acetaminophen] Hives            Medications Prior to Admission  Medication Sig Dispense Refill   amoxicillin -clavulanate (AUGMENTIN ) 875-125 MG tablet Take 1 tablet by mouth every 12 (twelve) hours.       atorvastatin  (LIPITOR) 10 MG tablet Take 5 mg by mouth at bedtime.       [Paused] candesartan (ATACAND) 4 MG tablet Take 0.5 tablets by mouth at bedtime.       celecoxib  (CELEBREX ) 200 MG capsule Take 1 capsule (200 mg total) by mouth 2 (two) times daily.       feeding supplement (ENSURE PLUS HIGH PROTEIN) LIQD Take 237 mLs by mouth 2 (two) times daily between meals.       ferrous sulfate 325 (65 FE) MG EC tablet Take 325 mg by mouth daily.       [START ON 11/29/2023] fluticasone  (FLONASE ) 50 MCG/ACT nasal spray Place 2 sprays into both nostrils daily.       lactulose  (CHRONULAC ) 10 GM/15ML solution Take 30 mLs (20 g total) by mouth once for 1 dose.       lidocaine  (LIDODERM ) 5 % Place 1 patch onto the skin daily. Remove & Discard patch within 12 hours or as directed by MD       [START ON 11/29/2023] loratadine  (CLARITIN ) 10 MG tablet Take 1 tablet (10 mg total) by mouth daily.       methocarbamol  (ROBAXIN ) 500 MG tablet Take 1 tablet (500 mg total) by mouth 3 (three) times daily.       metolazone  (ZAROXOLYN ) 2.5 MG tablet Take 1 tablet (2.5 mg total) by mouth daily. 90 tablet 3   Multiple Vitamins-Minerals (MULTIVITAMIN WITH MINERALS) tablet Take 0.5 tablets by mouth daily.       oxyCODONE  (OXY IR/ROXICODONE ) 5 MG immediate release tablet Take 0.5 tablets (2.5 mg total) by mouth every 3 (three) hours as needed for moderate pain (pain score 4-6) or breakthrough pain.       pantoprazole  (PROTONIX ) 40 MG tablet Take 1 tablet (40  mg total) by mouth 2 (two) times daily before a meal.       [START ON 11/29/2023] polyethylene glycol (MIRALAX  / GLYCOLAX ) 17 g packet Take 17 g by mouth daily.       Rivaroxaban  (XARELTO ) 15 MG TABS tablet Take 15 mg by mouth daily with supper.       senna-docusate (SENOKOT-S) 8.6-50 MG tablet Take 1 tablet by mouth 2 (two) times daily.       sodium chloride  (OCEAN) 0.65 % SOLN nasal spray Place 1 spray into both nostrils as needed for congestion.       tamsulosin  (FLOMAX ) 0.4 MG CAPS capsule Take 0.4 mg by mouth at bedtime.       torsemide  (DEMADEX )  20 MG tablet Take 1 tablet (20 mg total) by mouth daily as needed.                  Home: Home Living Family/patient expects to be discharged to:: Private residence Living Arrangements: Spouse/significant other Available Help at Discharge: Family, Available 24 hours/day Type of Home: House Home Access: Stairs to enter [employer] of Steps: 3 steps Entrance Stairs-Rails: Left, Right, Can reach both Home Layout: Bed/bath upstairs, Multi-level Alternate Level Stairs-Number of Steps: chair lift to get upstairs to bedroom Bathroom Shower/Tub: Engineer, manufacturing systems: Standard Bathroom Accessibility: Yes Home Equipment: Agricultural consultant (2 wheels), [employer] - quad, Information systems manager, BSC/3in1, Rollator (4 wheels) Additional Comments: aide drives pt to errands, exercises   Functional History: Prior Function Prior Level of Function : Needs assist Mobility Comments: ambulation with RW upstairs and rollator downstairs ADLs Comments: aide assists with LB dressing, driving, and entertainment during the day.   Functional Status:  Mobility: Bed Mobility Overal bed mobility: Needs Assistance Bed Mobility: Rolling, Sit to Sidelying Rolling: Mod assist, Used rails Sidelying to sit: Mod assist Sit to sidelying: Mod assist General bed mobility comments: in recliner Transfers Overall transfer level: Needs assistance Equipment used:  Rolling walker (2 wheels) Transfers: Sit to/from Stand Sit to Stand: Min assist General transfer comment: assist for balance Ambulation/Gait Ambulation/Gait assistance: Contact guard assist, Min assist Gait Distance (Feet): 150 Feet Assistive device: Rolling walker (2 wheels) Gait Pattern/deviations: Step-through pattern, Decreased stride length, Shuffle, Decreased dorsiflexion - left, Decreased dorsiflexion - right General Gait Details: CGA to min A at times for balance with turns and for walker proximity Gait velocity: decreased Gait velocity interpretation: <1.31 ft/sec, indicative of household ambulator   ADL: ADL Overall ADL's : Needs assistance/impaired Eating/Feeding: Set up, Sitting Grooming: Minimal assistance, Standing Grooming Details (indicate cue type and reason): intermittent A for balance, cues for compensatory technique to maintain spinal precations Upper Body Bathing: Minimal assistance, Sitting Lower Body Bathing: Moderate assistance, Sit to/from stand Upper Body Dressing : Minimal assistance, Sitting Lower Body Dressing: Moderate assistance, Sit to/from stand Lower Body Dressing Details (indicate cue type and reason): pt continues to attempt to bend to BLE; able to pull up but not don sock within precautions Toilet Transfer: Contact guard assist, Ambulation, Rolling walker (2 wheels), Minimal assistance Toilet Transfer Details (indicate cue type and reason): 1x assts for balance Toileting- Clothing Manipulation and Hygiene: Supervision/safety, Sitting/lateral lean Functional mobility during ADLs: Contact guard assist, Rolling walker (2 wheels), Cueing for safety   Cognition: Cognition Orientation Level: Oriented X4 Cognition Arousal: Alert Behavior During Therapy: WFL for tasks assessed/performed   Physical Exam: Blood pressure 121/66, pulse 60, temperature 97.6 F (36.4 C), temperature source Oral, resp. rate 20, height 5' 8 (1.727 m), weight 90.4 kg, SpO2  90%. Physical Exam   General: No apparent distress. Pt wife at bedside HEENT: Head is normocephalic, dried blood / scalp laceration, sclera anicteric, oral mucosa pink and moist, hearing aids in place Neck: Supple without JVD or lymphadenopathy Heart: Reg rate and rhythm. Chest: CTA bilaterally without wheezes, rales, or rhonchi; no distress, 3L o2 [place]. Occasional cough Abdomen: Soft, non-tender, non-distended, bowel sounds positive. Extremities: No clubbing, cyanosis, or edema. Pulses are 2+ Psych: Pt's affect is appropriate. Pt is cooperative GU: External catheter draining yellow urine Skin: Clean and intact without signs of breakdown Swelling anterior to L shoulder- Chronic Lipoma Neuro:     Mental Status: AAOx3, drowsy Speech/Languate: Naming and repetition intact, fluent, follows simple  commands, altered speech- difficulty pronouncing some syllables  CRANIAL NERVES: II: PERRL. Visual fields full III, IV, VI: EOM intact, no gaze preference or deviation V: normal sensation bilaterally VII: no asymmetry VIII: Hard of hearing IX, X: normal palatal elevation XI: 5/5 head turn and 5/5 shoulder shrug bilaterally XII: Tongue midline     MOTOR: RUE: 2/5 Deltoid, 4/5 Biceps, 4/5 Triceps,4/5 Grip LUE: 2/5 Deltoid, 4/5 Biceps, 4/5 Triceps, 4/5 Grip RLE: HF 4/5, KE 4/5, ADF 4/5, APF 4/5 LLE: HF 4/5, KE 4/5, ADF 4/5, APF 4/5     SENSORY: Normal to touch all 4 extremities   Coordination: No ataxia or dsymetria noted   MSK: pain with b/l shoulder passive abduction, crepitus noted. Pt reports chronic shoulder degeneration         Lab Results Last 48 Hours        Results for orders placed or performed during the hospital encounter of 11/22/23 (from the past 48 hours)  CBC     Status: Abnormal    Collection Time: 11/27/23  6:08 AM  Result Value Ref Range    WBC 6.6 4.0 - 10.5 K/uL    RBC 3.53 (L) 4.22 - 5.81 MIL/uL    Hemoglobin 10.7 (L) 13.0 - 17.0 g/dL    HCT 66.2 (L) 60.9 -  52.0 %    MCV 95.5 80.0 - 100.0 fL    MCH 30.3 26.0 - 34.0 pg    MCHC 31.8 30.0 - 36.0 g/dL    RDW 85.9 88.4 - 84.4 %    Platelets 178 150 - 400 K/uL    nRBC 0.0 0.0 - 0.2 %      Comment: Performed at [facility], [address], [place], [place] [address]  Renal function panel     Status: Abnormal    Collection Time: 11/27/23  6:08 AM  Result Value Ref Range    Sodium 140 135 - 145 mmol/L    Potassium 4.3 3.5 - 5.1 mmol/L    Chloride 107 98 - 111 mmol/L    CO2 23 22 - 32 mmol/L    Glucose, Bld 116 (H) 70 - 99 mg/dL      Comment: Glucose reference range applies only to samples taken after fasting for at least 8 hours.    BUN 35 (H) 8 - 23 mg/dL    Creatinine, Ser 8.69 (H) 0.61 - 1.24 mg/dL    Calcium  9.6 8.9 - 10.3 mg/dL    Phosphorus 3.8 2.5 - 4.6 mg/dL    Albumin 2.9 (L) 3.5 - 5.0 g/dL    GFR, Estimated 51 (L) >60 mL/min      Comment: (NOTE) Calculated using the CKD-EPI Creatinine Equation (2021)      Anion gap 10 5 - 15      Comment: Performed at [facility], [address], [place], [place] [address]  CBC     Status: Abnormal    Collection Time: 11/28/23  5:41 AM  Result Value Ref Range    WBC 7.2 4.0 - 10.5 K/uL    RBC 3.52 (L) 4.22 - 5.81 MIL/uL    Hemoglobin 10.7 (L) 13.0 - 17.0 g/dL    HCT 66.7 (L) 60.9 - 52.0 %    MCV 94.3 80.0 - 100.0 fL    MCH 30.4 26.0 - 34.0 pg    MCHC 32.2 30.0 - 36.0 g/dL    RDW 85.9 88.4 - 84.4 %    Platelets 177 150 - 400 K/uL  nRBC 0.0 0.0 - 0.2 %      Comment: Performed at [facility], [address] [place], [place] [address]  Basic metabolic panel     Status: Abnormal    Collection Time: 11/28/23  5:41 AM  Result Value Ref Range    Sodium 141 135 - 145 mmol/L    Potassium 4.0 3.5 - 5.1 mmol/L    Chloride 107 98 - 111 mmol/L    CO2 23 22 - 32 mmol/L    Glucose, Bld 114 (H) 70 - 99 mg/dL      Comment: Glucose reference range applies only to samples taken after fasting for at least 8 hours.    BUN 42 (H) 8 -  23 mg/dL    Creatinine, Ser 8.47 (H) 0.61 - 1.24 mg/dL    Calcium  9.5 8.9 - 10.3 mg/dL    GFR, Estimated 42 (L) >60 mL/min      Comment: (NOTE) Calculated using the CKD-EPI Creatinine Equation (2021)      Anion gap 11 5 - 15      Comment: Performed at [facility], [address], [place], [place] [address]       Imaging Results (Last 48 hours)  DG Cervical Spine 2 or 3 views Result Date: 11/28/2023 CLINICAL DATA:  Fracture. EXAM: CERVICAL SPINE - 2-3 VIEW COMPARISON:  CT 11/22/2023 FINDINGS: Displaced dens fracture with 12 mm posterior displacement of the proximal fracture fragment. No significant change in alignment from prior exam. The C1 fractures on prior CT are not well demonstrated by radiograph. Similar anterolisthesis of C4 on C5 and C5 on C6. Prevertebral soft tissue thickening is seen at the fracture site. AP views are limited due to osseous overlap. IMPRESSION: 1. Displaced dens fracture with 12 mm posterior displacement of the proximal fracture fragment. No significant change in alignment from prior exam. 2. C1 fractures on prior CT are not well demonstrated by radiograph. Electronically Signed   By: Andrea Gasman M.D.   On: 11/28/2023 14:24           Blood pressure 121/66, pulse 60, temperature 97.6 F (36.4 C), temperature source Oral, resp. rate 20, height 5' 8 (1.727 m), weight 90.4 kg, SpO2 90%.   Medical Problem List and Plan: 1. Functional deficits secondary to type II posterior displaced odontoid fracture after a fall             -patient may shower             -ELOS/Goals: 5-7 days, PT/OT/SLP Mod I              -Pts wife would like to be notified before any medication changes 2.  Antithrombotics: -DVT/anticoagulation:  Mechanical: Sequential compression devices, below knee Bilateral lower extremities Pharmaceutical: Xarelto              -antiplatelet therapy: N/A 3. Pain Management: Celebrex  (monitor Cr) Robaxin , oxycodone  as needed             - Left  knee pain-received cortisone injection by orthopedics 4. Mood/Behavior/Sleep: LCSW to follow for evaluation and support when available.              -antipsychotic agents: N/A 5. Neuropsych/cognition: This patient is capable of making decisions on his own behalf. 6. Skin/Wound Care: routine pressure relief measures -[place] J collar in place, monitor for changes in character of skin  -scalp laceration present on admission  7. Fluids/Electrolytes/Nutrition: monitor I/O and repeat labs in a.m.              -  Dysphagia 3 with nectar thick liquids--continue aspiration precautions. Continue SLP 8. HTN: resumed home irbesartan --no BB due to bradycardia 9.  Chronic diastolic CHF/chronic lower extremity edema: patient's wife requested metolazone  and not Demadex .              -Currently on metolazone  2.5 mg daily, BNP 261-- wife says this CAN NOT BE REMOVED  10. HLD: Continue home atorvastatin  11.  Mild anemia: Continue to monitor CBC recheck labs in a.m. 12.  AKI: Creatinine 1.38 on admission baseline approximately 1.0--trend creatinine. Last Cr 1.52. Recheck tomorrow. 13.  A-fib with bradycardia: Xarelto  resumed. 14. Obesity             Body mass index is 30.3 kg/m. 15. Bradycardia             -Pt was evaluated by cardiology, no indication for temporary pacing 16. Hx of aortic aneurysm/dissection -f/u outpatient, BP control   17. Aortic stenosis             -Repeat echo 6-12 months has been recommended 18. L knee pain             -L knee injection completed by ortho        Daphne LITTIE Finders, NP 11/28/2023   I have personally performed a face to face diagnostic evaluation of this patient and formulated the key components of the plan.  Additionally, I have personally reviewed laboratory data, imaging studies, as well as relevant notes and concur with the physician assistant's documentation above.   The patient's status has not changed from the original H&P.  Any changes in documentation from the  acute care chart have been noted above.   Murray Collier, MD

## 2023-11-29 ENCOUNTER — Ambulatory Visit: Admitting: Cardiology

## 2023-11-29 DIAGNOSIS — S12110S Anterior displaced Type II dens fracture, sequela: Secondary | ICD-10-CM

## 2023-11-29 LAB — CBC WITH DIFFERENTIAL/PLATELET
Abs Immature Granulocytes: 0.08 K/uL — ABNORMAL HIGH (ref 0.00–0.07)
Basophils Absolute: 0 K/uL (ref 0.0–0.1)
Basophils Relative: 1 %
Eosinophils Absolute: 0.4 K/uL (ref 0.0–0.5)
Eosinophils Relative: 6 %
HCT: 34.3 % — ABNORMAL LOW (ref 39.0–52.0)
Hemoglobin: 10.8 g/dL — ABNORMAL LOW (ref 13.0–17.0)
Immature Granulocytes: 1 %
Lymphocytes Relative: 14 %
Lymphs Abs: 1.1 K/uL (ref 0.7–4.0)
MCH: 30.3 pg (ref 26.0–34.0)
MCHC: 31.5 g/dL (ref 30.0–36.0)
MCV: 96.1 fL (ref 80.0–100.0)
Monocytes Absolute: 0.6 K/uL (ref 0.1–1.0)
Monocytes Relative: 8 %
Neutro Abs: 5.4 K/uL (ref 1.7–7.7)
Neutrophils Relative %: 70 %
Platelets: 191 K/uL (ref 150–400)
RBC: 3.57 MIL/uL — ABNORMAL LOW (ref 4.22–5.81)
RDW: 14 % (ref 11.5–15.5)
WBC: 7.6 K/uL (ref 4.0–10.5)
nRBC: 0 % (ref 0.0–0.2)

## 2023-11-29 LAB — COMPREHENSIVE METABOLIC PANEL WITH GFR
ALT: 20 U/L (ref 0–44)
AST: 24 U/L (ref 15–41)
Albumin: 2.9 g/dL — ABNORMAL LOW (ref 3.5–5.0)
Alkaline Phosphatase: 98 U/L (ref 38–126)
Anion gap: 14 (ref 5–15)
BUN: 47 mg/dL — ABNORMAL HIGH (ref 8–23)
CO2: 24 mmol/L (ref 22–32)
Calcium: 9.6 mg/dL (ref 8.9–10.3)
Chloride: 109 mmol/L (ref 98–111)
Creatinine, Ser: 1.58 mg/dL — ABNORMAL HIGH (ref 0.61–1.24)
GFR, Estimated: 40 mL/min — ABNORMAL LOW (ref >60)
Glucose, Bld: 101 mg/dL — ABNORMAL HIGH (ref 70–99)
Potassium: 4.2 mmol/L (ref 3.5–5.1)
Sodium: 147 mmol/L — ABNORMAL HIGH (ref 135–145)
Total Bilirubin: 0.8 mg/dL (ref 0.0–1.2)
Total Protein: 6 g/dL — ABNORMAL LOW (ref 6.5–8.1)

## 2023-11-29 MED ORDER — DIPHENHYDRAMINE HCL 25 MG PO CAPS: 25.0000 mg | ORAL_CAPSULE | Freq: Every evening | ORAL | Status: DC | PRN

## 2023-11-29 MED ORDER — METHOCARBAMOL 500 MG PO TABS
500.0000 mg | ORAL_TABLET | Freq: Three times a day (TID) | ORAL | Status: DC
  Administered 2023-11-29 – 2023-12-11 (×45): 500 mg via ORAL
  Filled 2023-11-29 (×36): qty 1

## 2023-11-29 MED ORDER — HYDRALAZINE HCL 10 MG PO TABS: 10.0000 mg | ORAL_TABLET | Freq: Four times a day (QID) | ORAL | Status: DC | PRN

## 2023-11-29 MED ORDER — SODIUM CHLORIDE 0.9 % IV SOLN: INTRAVENOUS | Status: AC

## 2023-11-29 MED ORDER — OXYCODONE HCL 5 MG PO TABS
2.5000 mg | ORAL_TABLET | Freq: Four times a day (QID) | ORAL | Status: DC | PRN
  Administered 2023-11-30 – 2023-12-02 (×3): 2.5 mg via ORAL
  Filled 2023-11-29 (×2): qty 1

## 2023-11-29 MED ORDER — TRAMADOL HCL 50 MG PO TABS
50.0000 mg | ORAL_TABLET | Freq: Four times a day (QID) | ORAL | Status: DC | PRN
  Administered 2023-12-01: 50 mg via ORAL
  Filled 2023-11-29: qty 1

## 2023-11-29 NOTE — Progress Notes (Signed)
 Inpatient Rehabilitation Admission Medication Review by a Pharmacist  A complete drug regimen review was completed for this patient to identify any potential clinically significant medication issues.  High Risk Drug Classes Is patient taking? Indication by Medication  Antipsychotic Yes Compazine  prn N/V  Anticoagulant Yes Xarelto  - CVA, Afib  Antibiotic Yes PO Augmentin  - pneumonia  Opioid Yes Oxycodone  prn pain  Antiplatelet No   Hypoglycemics/insulin No   Vasoactive Medication Yes Irbesartan  - HTN Metolazone  - fluid Tamsulosin  - BPH  Chemotherapy No   Other Yes Atorvastatin  - HLD Celecoxib , Lidocaine  patch- pain Methocarbamol  - prn spasms      Type of Medication Issue Identified Description of Issue Recommendation(s)  Drug Interaction(s) (clinically significant)     Duplicate Therapy     Allergy     No Medication Administration End Date     Incorrect Dose     Additional Drug Therapy Needed     Significant med changes from prior encounter (inform family/care partners about these prior to discharge).    Other       Clinically significant medication issues were identified that warrant physician communication and completion of prescribed/recommended actions by midnight of the next day:  No  Name of provider notified for urgent issues identified:   Provider Method of Notification:     Pharmacist comments: None  Time spent performing this drug regimen review (minutes):  20 minutes  Thank you. Olam Monte, PharmD

## 2023-11-29 NOTE — Evaluation (Signed)
 Speech Language Pathology Assessment and Plan  Patient Details  Name: Zachary Underwood MRN: [id number] Date of Birth: 18-Oct-1927  SLP Diagnosis: Cognitive Impairments;Dysphagia;Dysarthria  Rehab Potential: Good ELOS: 7-10 days    Today's Date: 11/29/2023 SLP Individual Time: 9184-9084 SLP Individual Time Calculation (min): 60 min   Hospital Problem: Principal Problem:   Cervical spine fracture (HCC) Active Problems:   Left knee pain   Dysphagia   Anemia   Chronic diastolic congestive heart failure (HCC)  Past Medical History:  Past Medical History:  Diagnosis Date   Arthritis    Cardiac arrhythmia    Hypertension    Ruptured aortic aneurysm (HCC)    Stroke (HCC) 08/23/2021   Past Surgical History:  Past Surgical History:  Procedure Laterality Date   open heart surgery     ruptured aortic aneurysm repair      Assessment / Plan / Recommendation Clinical Impression Pt is a 89 year old male with a significant medical history persistent atrial fibrillation on Xarelto , history of CVA, moderate aortic stenosis, s/p surgical repair of ascending thoracic aorta, HTN, HLD, BPH, chronic lower extremity edema who presented to [facility] on 11/22/2023 via EMS after experiencing a fall, striking his head and neck. Upon arrival, initial vital signs were stable. A CT scan of the head showed no acute cranial abnormalities but revealed a frontal parietal scalp laceration and hematoma, generalized cerebral atrophy, and no infarcts within the left cerebellar hemisphere. A CT maxillofacial scan showed no evidence of fracture, but a separate CT spine scan identified a type 2 odontoid fracture with approximately 12 mm posterior displacement relative to the body of C2, associated perivertebral soft tissue swelling, and possible epidural hematoma.    Dr. Lanis consulted and therefore requested an MRI of the cervical spine, which showed sub-ligamentous hematoma but no spinal cord compression at  C1-2. Conservative management was recommended, [place] -J collar to remain in place at all times. Prior to admission, Xarelto  was held, and the patient received K-centra in the ED. Cardiology was consulted for a.fib and bradycardia and indicated no need for temporary pacing as the patient remained asymptomatic. 2D echo showed EF of 70 to 75%, no regional WMA, mild mitral stenosis, moderate aortic valve stenosis, moderate dilation of ascending aorta measuring 47 mm. The patient exhibited dysarthria and swallowing deficits and was placed on a dysphagia 3 diet with nectar-thick liquids.  Patient had an MRI of his brain completed without acute CVA.  Patient was seen by Dr. Burnetta orthopedics, recommended continued nonoperative management.  Plan for repeat x-rays in 4 weeks.  Bedside Swallow Eval: As best judged at bedside, pt appeared to present w/ mild to moderate pharyngeal dysphagia. Oral phase appeared [facility]. Oral care completed via suctioning prior to PO trials. PO trials completed w/ ice chips, thin liquids via tspn, thin liquids via cup, mildly thick (nectar) liquids, puree, and Dys 3 textures. Trace delayed coughing noted w/ pureed consistencies. No overt s/s of airway invasion noted w/ Dys 3 textures or nectar thick liquids. Immediate coughing noted during 1/3 ice chip trials, 3/3 liquid trials via tsp, and 3/10 thin liquid trials via cup. He independently utilized double swallow, as previously recommended from acute care SLP. Given reduced overt s/s of airway invasion w/ thin liquids, he would benefit from introduction of [place] Free Water Protocol to enhance hydration and maximize swallow functioning. Recommend repeat MBSS, which is scheduled for 8/11. In the meantime, continue w/ Dys 3 textures and mildly thick (nectar) liquids - no straws.  Meds crushed in puree. Intermittent supervision during meals.  Cognitive-linguistic Eval: Functional eval tasks completed given priority of swallow eval vs cog.  Cognitive function WFL overall. Some very mild STM deficits noted when recalling timeline of recent events and details of current aspiration precautions, though no concerns otherwise. Pt reported concerns re vocal quality and imprecise articulation. Cul de sac resonance noted, which did result in distorted consonant production intermittently. Despite this, he remained 100% intelligible throughout conversational tasks. This was most evident w/ stop sounds. Anticipate this is d/t cervical inflammation/swelling at this time. No concern re expressive/receptive language deficits.   Recommend skilled ST services to target aforementioned deficits, maximize swallow safety/efficiency, maximize pt communication, and facilitate subsequent return to prev roles/responsibilities.     Skilled Therapeutic Interventions          SLP facilitated a cognitive-linguistic and bedside swallow evaluation to assess pt's cognitive-communication skills and determine need for additional skilled ST services. See above for more information.    SLP Assessment  Patient will need skilled Speech Lanaguage Pathology Services during CIR admission    Recommendations  SLP Diet Recommendations: Dysphagia 3 (Mech soft);Nectar;Free water protocol after oral care Liquid Administration via: Cup Medication Administration: Crushed with puree Supervision: Patient able to self feed;Intermittent supervision to cue for compensatory strategies Compensations: Slow rate;Small sips/bites;Effortful swallow;Multiple dry swallows after each bite/sip Postural Changes and/or Swallow Maneuvers: Seated upright 90 degrees;Upright 30-60 min after meal Oral Care Recommendations: Oral care BID;Oral care prior to ice chip/H20 Patient destination: Home Follow up Recommendations: Outpatient SLP Equipment Recommended: None recommended by SLP    SLP Frequency 3 to 5 out of 7 days   SLP Duration  SLP Intensity  SLP Treatment/Interventions 7-10  days  Minumum of 1-2 x/day, 30 to 90 minutes  Speech/Language facilitation;Cognitive remediation/compensation;Cueing hierarchy;Functional tasks;Therapeutic Activities;Therapeutic Exercise;Oral motor exercises;Patient/family education;Dysphagia/aspiration precaution training    Pain  None endorsed  Prior Functioning Type of Home: House  Lives With: Spouse Available Help at Discharge: Family;Available 24 hours/day  SLP Evaluation Cognition Overall Cognitive Status: Within Functional Limits for tasks assessed Arousal/Alertness: Awake/alert Orientation Level: Oriented X4 Year: 2025 Month: August Day of Week: Correct Attention: Selective Selective Attention: Appears intact Memory: Impaired Memory Impairment: Decreased recall of new information;Decreased short term memory Awareness: Appears intact Problem Solving: Appears intact Safety/Judgment: Appears intact  Comprehension Auditory Comprehension Overall Auditory Comprehension: Appears within functional limits for tasks assessed Expression Expression Primary Mode of Expression: Verbal Verbal Expression Overall Verbal Expression: Appears within functional limits for tasks assessed Oral Motor Oral Motor/Sensory Function Overall Oral Motor/Sensory Function: Within functional limits Motor Speech Overall Motor Speech: Impaired Respiration: Within functional limits Resonance:  (cul de sac resonance) Articulation: Impaired Level of Impairment: Conversation Intelligibility: Intelligible Motor Planning: Within functional limits Interfering Components: Anatomical limitations Effective Techniques: Over-articulate;Slow rate;Pause  Care Tool Care Tool Cognition Ability to hear (with hearing aid or hearing appliances if normally used Ability to hear (with hearing aid or hearing appliances if normally used): 2. Moderate difficulty - speaker has to increase volume and speak distinctly   Expression of Ideas and Wants Expression of  Ideas and Wants: 4. Without difficulty (complex and basic) - expresses complex messages without difficulty and with speech that is clear and easy to understand   Understanding Verbal and Non-Verbal Content Understanding Verbal and Non-Verbal Content: 4. Understands (complex and basic) - clear comprehension without cues or repetitions  Memory/Recall Ability Memory/Recall Ability : Current season;That he or she is in a hospital/hospital unit;Location of own room  Motor Speech Assessment  Intelligibility: Intelligible  Bedside Swallowing Assessment General Date of Onset: 11/22/23 Previous Swallow Assessment: MBSS 8/4 recommended Dys 3 textures and mildly (nectar) thick liquids Diet Prior to this Study: Dysphagia 3 (mechanical soft);Mildly thick liquids (Level 2, nectar thick) Temperature Spikes Noted: No Respiratory Status: Room air History of Recent Intubation: No Behavior/Cognition: Alert;Cooperative;Pleasant mood Oral Cavity - Dentition: Adequate natural dentition Self-Feeding Abilities: Able to feed self Vision: Functional for self-feeding Patient Positioning: Upright in bed Baseline Vocal Quality: Wet Volitional Cough: Weak Volitional Swallow: Able to elicit   Oral Care Assessment Oral Assessment  (WDL): Within Defined Limits Lips: Symmetrical Teeth: Intact Tongue: Pink;Moist Mucous Membrane(s): Moist;Pink Saliva: Moist, saliva free flowing Level of Consciousness: Alert Is patient on any of following O2 devices?: None of the above Nutritional status: On thickened liquids Oral Assessment Risk : High Risk  BSE Assessment Risk for Aspiration Impact on safety and function: Moderate aspiration risk Other Related Risk Factors: Decreased management of secretions;Deconditioning  Short Term Goals: Week 1: SLP Short Term Goal 1 (Week 1): STGs = LTGs d/t ELOS  Refer to Care Plan for Long Term Goals  Recommendations for other services: None   Discharge Criteria: Patient will  be discharged from SLP if patient refuses treatment 3 consecutive times without medical reason, if treatment goals not met, if there is a change in medical status, if patient makes no progress towards goals or if patient is discharged from hospital.  The above assessment, treatment plan, treatment alternatives and goals were discussed and mutually agreed upon: by patient  [name] 11/29/2023, 8:03 PM

## 2023-11-29 NOTE — Evaluation (Signed)
 Physical Therapy Assessment and Plan  Patient Details  Name: Zachary Underwood MRN: [id number] Date of Birth: 05/03/1927  PT Diagnosis: Difficulty walking and Muscle weakness Rehab Potential: Good ELOS: 7-10 Days   Today's Date: 11/29/2023 PT Individual Time: 1331-1445 PT Individual Time Calculation (min): 74 min    Hospital Problem: Principal Problem:   Cervical spine fracture (HCC) Active Problems:   Left knee pain   Dysphagia   Anemia   Chronic diastolic congestive heart failure (HCC)   Past Medical History:  Past Medical History:  Diagnosis Date   Arthritis    Cardiac arrhythmia    Hypertension    Ruptured aortic aneurysm (HCC)    Stroke (HCC) 08/23/2021   Past Surgical History:  Past Surgical History:  Procedure Laterality Date   open heart surgery     ruptured aortic aneurysm repair      Assessment & Plan Clinical Impression: Patient is a 89 year old male with a significant medical history persistent atrial fibrillation on Xarelto , history of CVA, moderate aortic stenosis, s/p surgical repair of ascending thoracic aorta, HTN, HLD, BPH, chronic lower extremity edema  who presented to [facility] on 11/22/2023 via EMS after experiencing a fall, striking his head and neck. Upon arrival, initial vital signs were stable. Laboratory tests revealed WBC 7.0, Hemoglobin 10.6, Platelets 170, Sodium 138, Potassium 3.9, BUN 127, Creatinine 1.38, and Bicarb 24. A CT scan of the head showed no acute cranial abnormalities but revealed a frontal parietal scalp laceration and hematoma, generalized cerebral atrophy, and no infarcts within the left cerebellar hemisphere. A CT maxillofacial scan showed no evidence of fracture, but a separate CT spine scan identified a type 2 odontoid fracture with approximately 12 mm posterior displacement relative to the body of C2, associated perivertebral soft tissue swelling, and possible epidural hematoma.    Dr. Lanis consulted was therefore  requested  An MRI of the cervical spine showed sub-ligamentous hematoma but no spinal cord compression at C1-2. Conservative management was recommended, [place] -J collar to remain in place at all times. Prior to admission, Xarelto  was held, and the patient received K-centra in the ED. Cardiology was consulted for a.fib and bradycardia and indicated no need for temporary pacing as the patient remained asymptomatic. 2D echo showed EF of 70 to 75%, no regional WMA, mild mitral stenosis, moderate aortic valve stenosis, moderate dilation of ascending aorta measuring 47 mm. The patient exhibited dysarthria and swallowing deficits and was placed on a dysphagia 3 diet with nectar-thick liquids.  Patient had an MRI of his brain completed without acute CVA.  Patient was seen by Dr. Burnetta orthopedics, recommended continued nonoperative management.  Plan for repeat x-rays in 4 weeks.  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  He currently requires CGA with rolling walker and cueing for safety, min assist to stand and CGA to min assist for ambulation  Patient transferred to CIR on 11/28/2023 .   Patient currently requires min with mobility secondary to muscle weakness, decreased cardiorespiratoy endurance, and decreased sitting balance, decreased standing balance, and decreased balance strategies.  Prior to hospitalization, patient was modified independent  with mobility and lived with   in a   home.  Home access is 3 steps .  Patient will benefit from skilled PT intervention to maximize safe functional mobility, minimize fall risk, and decrease caregiver burden for planned discharge home with intermittent assist.  Anticipate patient will benefit from follow up OP at discharge.  PT -  End of Session Activity Tolerance: Tolerates 30+ min activity with multiple rests Endurance Deficit: Yes PT Assessment Rehab Potential (ACUTE/IP ONLY): Good PT Barriers to Discharge: Home environment  access/layout PT Patient demonstrates impairments in the following area(s): Balance;Endurance;Motor;Pain;Safety PT Transfers Functional Problem(s): Bed Mobility;Bed to Chair;Car;Furniture PT Locomotion Functional Problem(s): Ambulation;Stairs PT Plan PT Intensity: Minimum of 1-2 x/day ,45 to 90 minutes PT Frequency: 5 out of 7 days PT Duration Estimated Length of Stay: 7-10 Days PT Treatment/Interventions: Ambulation/gait training;Community reintegration;DME/adaptive equipment instruction;Psychosocial support;Stair training;UE/LE Strength taining/ROM;Balance/vestibular training;Neuromuscular re-education;Discharge planning;Pain management;Therapeutic Activities;UE/LE Coordination activities;Functional mobility training;Patient/family education;Therapeutic Exercise PT Transfers Anticipated Outcome(s): Supervision PT Locomotion Anticipated Outcome(s): Supervision PT Recommendation Recommendations for Other Services: None Follow Up Recommendations: Outpatient PT Patient destination: Home Equipment Recommended: To be determined   PT Evaluation Precautions/Restrictions Precautions Precautions: Fall;Cervical Required Braces or Orthoses: Cervical Brace Cervical Brace: At all times;Hard collar Restrictions Weight Bearing Restrictions Per Provider Order: No General Chart Reviewed: Yes Family/Caregiver Present: No  Pain Interference Pain Interference Pain Effect on Sleep: 2. Occasionally Pain Interference with Therapy Activities: 3. Frequently Pain Interference with Day-to-Day Activities: 4. Almost constantly Home Living/Prior Functioning Home Living Available Help at Discharge: Family;Available 24 hours/day Type of Home: House Home Access: Stairs to enter [employer] of Steps: 3 steps Entrance Stairs-Rails: Left;Right;Can reach both Home Layout: Bed/bath upstairs;Multi-level Alternate Level Stairs-Number of Steps: chair lift to get upstairs to bedroom Bathroom Shower/Tub:  Associate Professor: Yes  Lives With: Spouse Prior Function Level of Independence: Requires assistive device for independence  Able to Take Stairs?: Yes Vision/Perception  Vision - History Ability to See in Adequate Light: 0 Adequate Perception Perception: Within Functional Limits Praxis Praxis: WFL  Cognition Overall Cognitive Status: Within Functional Limits for tasks assessed Arousal/Alertness: Awake/alert Memory: Appears intact Awareness: Appears intact Problem Solving: Appears intact Safety/Judgment: Appears intact Sensation Sensation Light Touch: Impaired Detail Central sensation comments: Occasional tingling through BUE Coordination Gross Motor Movements are Fluid and Coordinated: No Fine Motor Movements are Fluid and Coordinated: No Coordination and Movement Description: generalized deconditioning Finger Nose Finger Test: WNL Motor  Motor Motor: Other (comment) Motor - Skilled Clinical Observations: generalized weakness  Trunk/Postural Assessment  Cervical Assessment Cervical Assessment: Exceptions to [facility] (cervical collar) Thoracic Assessment Thoracic Assessment: Exceptions to [facility] (rounded shoulders, kyphotic posture) Lumbar Assessment Lumbar Assessment: Exceptions to [facility] (posterior pelvic tilt) Postural Control Postural Control: Deficits on evaluation Righting Reactions: delayed  Balance Balance Balance Assessed: Yes Static Sitting Balance Static Sitting - Balance Support: Feet supported Static Sitting - Level of Assistance: 7: Independent Dynamic Sitting Balance Dynamic Sitting - Balance Support: Feet supported Dynamic Sitting - Level of Assistance: 6: Modified independent (Device/Increase time) Static Standing Balance Static Standing - Balance Support: During functional activity;Bilateral upper extremity supported Static Standing - Level of Assistance: 4: Min assist Dynamic Standing Balance Dynamic  Standing - Balance Support: During functional activity;Bilateral upper extremity supported Dynamic Standing - Level of Assistance: 4: Min assist Extremity Assessment  RUE Assessment RUE Assessment: Exceptions to [facility] General Strength Comments: 4-/5. About 100 degrees flex RUE Body System: Ortho LUE Assessment LUE Assessment: Exceptions to [facility] General Strength Comments: 4-/5. About 90 degrees flex LUE Body System: Ortho RLE Assessment RLE Assessment: Exceptions to [facility] General Strength Comments: Grossly 4/5 LLE Assessment LLE Assessment: Exceptions to [facility] General Strength Comments: Grossly 4/5  Care Tool Care Tool Bed Mobility Roll left and right activity   Roll left and right assist level: Supervision/Verbal cueing    Sit to  lying activity   Sit to lying assist level: Supervision/Verbal cueing    Lying to sitting on side of bed activity   Lying to sitting on side of bed assist level: the ability to move from lying on the back to sitting on the side of the bed with no back support.: Supervision/Verbal cueing     Care Tool Transfers Sit to stand transfer   Sit to stand assist level: Minimal Assistance - Patient > 75%    Chair/bed transfer   Chair/bed transfer assist level: Minimal Assistance - Patient > 75%    Car transfer          Care Tool Locomotion Ambulation   Assist level: Minimal Assistance - Patient > 75% Assistive device: Walker-rolling Max distance: 200'  Walk 10 feet activity   Assist level: Minimal Assistance - Patient > 75% Assistive device: Walker-rolling   Walk 50 feet with 2 turns activity   Assist level: Minimal Assistance - Patient > 75% Assistive device: Walker-rolling  Walk 150 feet activity   Assist level: Minimal Assistance - Patient > 75% Assistive device: Walker-rolling  Walk 10 feet on uneven surfaces activity   Assist level: Minimal Assistance - Patient > 75% Assistive device: Walker-rolling  Stairs   Assist level: Minimal Assistance -  Patient > 75% Stairs assistive device: 2 hand rails Max number of stairs: 12  Walk up/down 1 step activity   Walk up/down 1 step (curb) assist level: Minimal Assistance - Patient > 75% Walk up/down 1 step or curb assistive device: 2 hand rails  Walk up/down 4 steps activity   Walk up/down 4 steps assist level: Minimal Assistance - Patient > 75% Walk up/down 4 steps assistive device: 2 hand rails  Walk up/down 12 steps activity   Walk up/down 12 steps assist level: Minimal Assistance - Patient > 75% Walk up/down 12 steps assistive device: 2 hand rails  Pick up small objects from floor   Pick up small object from the floor assist level: Dependent - Patient 0%    Wheelchair Is the patient using a wheelchair?: Yes Type of Wheelchair: Manual Wheelchair activity did not occur: Safety/medical concerns   Max wheelchair distance: 150'  Wheel 50 feet with 2 turns activity   Assist Level: Dependent - Patient 0%  Wheel 150 feet activity   Assist Level: Dependent - Patient 0%    Refer to Care Plan for Long Term Goals  SHORT TERM GOAL WEEK 1 PT Short Term Goal 1 (Week 1): STGs = LTGs  Recommendations for other services: None   Skilled Therapeutic Intervention  Evaluation completed (see details above and below) with education on PT POC and goals and individual treatment initiated with focus on balance, transfers, ambulation, car transfer, and stair training. Pt received seated in recliner and agrees to hterapy. No complaint of pain. Pt performs sit to stand from recliner with minA and cues for hand placement, initiation, and sequencing. Pt ambulates x15' to [facility] with RW and cues for positioning and hand placement. WC transport to gym. Pt completes ramp navigation and car transfer with RW and minA, with cues for sequencing and safety. Seated rest break. Pt stands and ambulates x200' with RW and cues for upright gaze to improve posture and balance, and incresaing stride length bilaterally to improve  balance and decrease risk for falls. Seated rest break. Pt completes x12 6 steps with bilateral handrails and minA, with cues for step sequencing and foot placement for safety. WC transport back to room. Pt performs  ambulatory transfer back to recliner with minA and cues for safe AD management and positioning. Left seated in recliner with all needs within reach   Mobility Bed Mobility Bed Mobility: Sit to Supine;Supine to Sit Supine to Sit: Contact Guard/Touching assist Transfers Transfers: Sit to Stand;Stand to Sit;Stand Pivot Transfers Sit to Stand: Minimal Assistance - Patient > 75% Stand to Sit: Minimal Assistance - Patient > 75% Stand Pivot Transfers: Minimal Assistance - Patient > 75% Transfer (Assistive device): Rolling walker Locomotion  Gait Ambulation: Yes Gait Assistance: Minimal Assistance - Patient > 75% Gait Distance (Feet): 200 Feet Assistive device: Rolling walker Gait Assistance Details: Verbal cues for gait pattern;Verbal cues for technique;Tactile cues for posture;Verbal cues for safe use of DME/AE Gait Gait: Yes Gait Pattern: Impaired Gait Pattern: Decreased stride length;Decreased trunk rotation;Trunk flexed Gait velocity: decreased Stairs / Additional Locomotion Stairs: Yes Stairs Assistance: Minimal Assistance - Patient > 75% Stair Management Technique: Two rails Number of Stairs: 12 Height of Stairs: 6 Ramp: Minimal Assistance - Patient >75% Curb: Minimal Assistance - Patient >75% Wheelchair Mobility Wheelchair Mobility: No   Discharge Criteria: Patient will be discharged from PT if patient refuses treatment 3 consecutive times without medical reason, if treatment goals not met, if there is a change in medical status, if patient makes no progress towards goals or if patient is discharged from hospital.  The above assessment, treatment plan, treatment alternatives and goals were discussed and mutually agreed upon: by patient  [name], PT,  DPT 11/29/2023, 5:33 PM

## 2023-11-29 NOTE — Progress Notes (Signed)
 Zachary Albright, MD  Physician Physical Medicine and Rehabilitation   PMR Pre-admission    Signed   Date of Service: 11/28/2023 10:48 AM  Related encounter: ED to Hosp-Admission (Discharged) from 11/22/2023 in [facility]   Signed     Expand All Collapse All  PMR Admission Coordinator Pre-Admission Assessment   Patient: Zachary Underwood is an 89 y.o., male MRN: [id number] DOB: July 29, 1927 Height: 5' 8 (172.7 cm) Weight: 90.4 kg   Insurance Information HMO:     PPO:      PCP:      IPA:      80/20: yes     OTHER:  PRIMARY: Medicare Part A and B      Policy#: [id number]     Subscriber: pt Benefits:  Phone #: passport one source     Name:  Eff. Date active    Deduct: $1676      Out of Pocket Max: none      Life Max: none CIR: 100%      SNF: 20 dull days Outpatient: 80%     Co-Pay: 20% Home Health: 100%      Co-Pay:  DME: 80%     Co-Pay: 20% Providers: pt choice SECONDARY: [employer]      Policy#: [id number]      Phone#:    Financial Counselor:       Phone#:    The "Data Collection Information Summary" for patients in Inpatient Rehabilitation Facilities with attached "Privacy Act Statement-Health Care Records" was provided and verbally reviewed with: Patient and Family   Emergency Contact Information Contact Information       Name Relation Home Work Mobile    Zachary Underwood Spouse [phone]   [phone]         Other Contacts   None on File        Current Medical History  Patient Admitting Diagnosis: Fall with spinal fx History of Present Illness: Zachary Underwood is a 89 y.o. male with medical history significant for persistent atrial fibrillation on Xarelto , history of CVA, moderate aortic stenosis, s/p surgical repair of ascending thoracic aorta, HTN, HLD, BPH, chronic lower extremity edema who presented to the [facility] ED 11/22/2023 for evaluation after he fell and hit his head and neck. Initial vitals showed BP  137/58, pulse 107, RR 16, temp 97.3 F, SpO2 97% on room air.Labs showed WBC 7.0, hemoglobin 10.6, platelets 170, sodium 138, potassium 3.9, bicarb 24, BUN 27, creatinine 1.38, serum glucose 137, LFTs within normal limits, lactic acid 1.4.CT head with  No evidence of an acute intracranial abnormality, a Frontoparietal scalp laceration and hematoma, Generalized cerebral atroph and  Known chronic infarcts within the left cerebellar hemisphere. CT maxillofacial: showed No evidence of an acute maxillofacial fracture but acute cervical spine fractures separately reported on same day cervical spine CT. CT of C spine with Displaced type II odontoid fracture with approximately 12 mm posterior displacement relative to the body of C2. Associated prevertebral soft tissue swelling and possible epidural hematoma. There is at least moderate spinal canal stenosis at C1-2. Recommend MRI cervical spine for further evaluation.  Additional mildly displaced fractures through the posterior arch of C1 and an additional nondisplaced fracture through the midline anterior arch of C1, also noted similar anterolisthesis of C5 on C6 likely related to degenerative changes. 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 Edema along the posterior atlantoaxial membrane suggestive  of ligamentous injury. Possible disruption of the ligamentum flavum at C2-3 and C3-4.  Pt. In Cervical collar with conservative management recommended. Pt. With some dysarthria and swallowing deficits, seen by SLP and placed on dysphagia 3 diet with nectar thick liquids. PT/OT also saw and recommend CIR to assist return to PLOF.      CT chest/abdomen/pelvis with contrast Past Medical History      Past Medical History:  Diagnosis Date   Arthritis     Cardiac arrhythmia     Hypertension     Ruptured aortic aneurysm (HCC)     Stroke (HCC) 08/23/2021          Has the patient had major  surgery during 100 days prior to admission? No   Family History   family history is not on file.   Current Medications  Current Medications    Current Facility-Administered Medications:    amoxicillin -clavulanate (AUGMENTIN ) 875-125 MG per tablet 1 tablet, 1 tablet, Oral, Q12H, Vann, Jessica U, DO   atorvastatin  (LIPITOR) tablet 5 mg, 5 mg, Oral, QHS, Patel, Vishal R, MD, 5 mg at 11/27/23 2142   celecoxib  (CELEBREX ) capsule 200 mg, 200 mg, Oral, BID, Rai, Ripudeep K, MD, 200 mg at 11/28/23 0845   feeding supplement (ENSURE PLUS HIGH PROTEIN) liquid 237 mL, 237 mL, Oral, BID BM, Rai, Ripudeep K, MD, 237 mL at 11/28/23 0905   fluticasone  (FLONASE ) 50 MCG/ACT nasal spray 2 spray, 2 spray, Each Nare, Daily, Rai, Ripudeep K, MD, 2 spray at 11/28/23 0846   guaiFENesin  (MUCINEX ) 12 hr tablet 600 mg, 600 mg, Oral, BID, Vann, Jessica U, DO   hydrALAZINE  (APRESOLINE ) injection 10 mg, 10 mg, Intravenous, Q6H PRN, Rai, Ripudeep K, MD   HYDROmorphone  (DILAUDID ) injection 0.5 mg, 0.5 mg, Intravenous, Q3H PRN, Vann, Jessica U, DO   irbesartan  (AVAPRO ) tablet 37.5 mg, 37.5 mg, Oral, Daily, Rai, Ripudeep K, MD, 37.5 mg at 11/28/23 0845   lactulose  (CHRONULAC ) 10 GM/15ML solution 20 g, 20 g, Oral, Once, Rai, Ripudeep K, MD   lidocaine  (LIDODERM ) 5 % 1 patch, 1 patch, Transdermal, Q24H, Rai, Ripudeep K, MD   loratadine  (CLARITIN ) tablet 10 mg, 10 mg, Oral, Daily, Rai, Ripudeep K, MD, 10 mg at 11/28/23 0845   methocarbamol  (ROBAXIN ) injection 500 mg, 500 mg, Intravenous, Q8H, Rai, Ripudeep K, MD, 500 mg at 11/28/23 0531   metolazone  (ZAROXOLYN ) tablet 2.5 mg, 2.5 mg, Oral, Daily, Vann, Jessica U, DO   ondansetron  (ZOFRAN ) tablet 4 mg, 4 mg, Oral, Q6H PRN **OR** ondansetron  (ZOFRAN ) injection 4 mg, 4 mg, Intravenous, Q6H PRN, Patel, Vishal R, MD   oxyCODONE  (Oxy IR/ROXICODONE ) immediate release tablet 2.5 mg, 2.5 mg, Oral, Q3H PRN, Vann, Jessica U, DO   pantoprazole  (PROTONIX ) EC tablet 40 mg, 40 mg, Oral, BID  AC, Rai, Ripudeep K, MD, 40 mg at 11/28/23 0845   polyethylene glycol (MIRALAX  / GLYCOLAX ) packet 17 g, 17 g, Oral, Daily, Rai, Ripudeep K, MD, 17 g at 11/28/23 0845   Rivaroxaban  (XARELTO ) tablet 15 mg, 15 mg, Oral, Q supper, Dang, Thuy D, RPH, 15 mg at 11/27/23 1650   senna-docusate (Senokot-S) tablet 1 tablet, 1 tablet, Oral, BID, Rai, Ripudeep K, MD, 1 tablet at 11/28/23 0845   sodium chloride  (OCEAN) 0.65 % nasal spray 1 spray, 1 spray, Each Nare, PRN, Rai, Ripudeep K, MD   sodium chloride  flush (NS) 0.9 % injection 3 mL, 3 mL, Intravenous, Q12H, Tobie Bloch R, MD, 3 mL at 11/28/23 0905   tamsulosin  (FLOMAX ) capsule  0.4 mg, 0.4 mg, Oral, QHS, Patel, Vishal R, MD, 0.4 mg at 11/27/23 2143     Patients Current Diet:  Diet Order                  DIET DYS 3 Room service appropriate? Yes with Assist; Fluid consistency: Nectar Thick  Diet effective now                         Precautions / Restrictions Precautions Precautions: Fall, Cervical Precaution Booklet Issued: Yes (comment) Precaution/Restrictions Comments: precautions reviewed for UE lifting Cervical Brace: At all times, Hard collar Restrictions Weight Bearing Restrictions Per Provider Order: No    Has the patient had 2 or more falls or a fall with injury in the past year? Yes   Prior Activity Level Community (5-7x/wk): Pt. active in the communiity   Prior Functional Level Self Care: Did the patient need help bathing, dressing, using the toilet or eating? Independent   Indoor Mobility: Did the patient need assistance with walking from room to room (with or without device)? Independent   Stairs: Did the patient need assistance with internal or external stairs (with or without device)? Needed some help   Functional Cognition: Did the patient need help planning regular tasks such as shopping or remembering to take medications? Independent   Patient Information Are you of Hispanic, Latino/a,or Spanish origin?: A.  No, not of Hispanic, Latino/a, or Spanish origin What is your race?: A. White Do you need or want an interpreter to communicate with a doctor or health care staff?: 0. No   Patient's Response To:  Health Literacy and Transportation Is the patient able to respond to health literacy and transportation needs?: Yes Health Literacy - How often do you need to have someone help you when you read instructions, pamphlets, or other written material from your doctor or pharmacy?: Never In the past 12 months, has lack of transportation kept you from medical appointments or from getting medications?: No In the past 12 months, has lack of transportation kept you from meetings, work, or from getting things needed for daily living?: No   Home Assistive Devices / Equipment Home Equipment: Agricultural consultant (2 wheels), [employer] - quad, Information systems manager, BSC/3in1, Rollator (4 wheels)   Prior Device Use: Indicate devices/aids used by the patient prior to current illness, exacerbation or injury? Walker   Current Functional Level Cognition   Orientation Level: Oriented X4    Extremity Assessment (includes Sensation/Coordination)   Upper Extremity Assessment: Generalized weakness  Lower Extremity Assessment: Defer to PT evaluation     ADLs   Overall ADL's : Needs assistance/impaired Eating/Feeding: Set up, Sitting Grooming: Minimal assistance, Standing Grooming Details (indicate cue type and reason): intermittent A for balance, cues for compensatory technique to maintain spinal precations Upper Body Bathing: Minimal assistance, Sitting Lower Body Bathing: Moderate assistance, Sit to/from stand Upper Body Dressing : Minimal assistance, Sitting Lower Body Dressing: Moderate assistance, Sit to/from stand Lower Body Dressing Details (indicate cue type and reason): pt continues to attempt to bend to BLE; able to pull up but not don sock within precautions Toilet Transfer: Contact guard assist, Ambulation, Rolling walker  (2 wheels), Minimal assistance Toilet Transfer Details (indicate cue type and reason): 1x assts for balance Toileting- Clothing Manipulation and Hygiene: Supervision/safety, Sitting/lateral lean Functional mobility during ADLs: Contact guard assist, Rolling walker (2 wheels), Cueing for safety     Mobility   Overal bed mobility: Needs Assistance Bed Mobility:  Rolling, Sit to Sidelying Rolling: Mod assist, Used rails Sidelying to sit: Mod assist Sit to sidelying: Mod assist General bed mobility comments: in recliner     Transfers   Overall transfer level: Needs assistance Equipment used: Rolling walker (2 wheels) Transfers: Sit to/from Stand Sit to Stand: Min assist General transfer comment: assist for balance     Ambulation / Gait / Stairs / Wheelchair Mobility   Ambulation/Gait Ambulation/Gait assistance: Contact guard assist, Min assist Gait Distance (Feet): 150 Feet Assistive device: Rolling walker (2 wheels) Gait Pattern/deviations: Step-through pattern, Decreased stride length, Shuffle, Decreased dorsiflexion - left, Decreased dorsiflexion - right General Gait Details: CGA to min A at times for balance with turns and for walker proximity Gait velocity: decreased Gait velocity interpretation: <1.31 ft/sec, indicative of household ambulator     Posture / Balance Dynamic Sitting Balance Sitting balance - Comments: heavy UE support on arms of chair to pull forward, can sit once upright with S Balance Overall balance assessment: Needs assistance Sitting-balance support: Feet supported Sitting balance-Leahy Scale: Good Sitting balance - Comments: heavy UE support on arms of chair to pull forward, can sit once upright with S Standing balance support: Bilateral upper extremity supported Standing balance-Leahy Scale: Poor Standing balance comment: UE support for balance and CGA     Special considerations/life events  Skin scalp lac and Special service needs c collar    Previous  Home Environment (from acute therapy documentation) Living Arrangements: Spouse/significant other Available Help at Discharge: Family, Available 24 hours/day Type of Home: House Home Layout: Bed/bath upstairs, Multi-level Alternate Level Stairs-Number of Steps: chair lift to get upstairs to bedroom Home Access: Stairs to enter Entrance Stairs-Rails: Left, Right, Can reach both Entrance Stairs-Number of Steps: 3 steps Bathroom Shower/Tub: Engineer, manufacturing systems: Standard Bathroom Accessibility: Yes How Accessible: Accessible via walker Additional Comments: aide drives pt to errands, exercises   Discharge Living Setting Plans for Discharge Living Setting: House Type of Home at Discharge: House Discharge Home Layout: Multi-level, Able to live on main level with bedroom/bathroom Alternate Level Stairs-Rails: Right, Left Alternate Level Stairs-Number of Steps: chair lift Discharge Home Access: Stairs to enter Entrance Stairs-Rails: Right, Left Entrance Stairs-Number of Steps: 3 Discharge Bathroom Shower/Tub: Walk-in shower Discharge Bathroom Toilet: Handicapped height Discharge Bathroom Accessibility: Yes How Accessible: Accessible via walker   Social/Family/Support Systems Patient Roles: Spouse Contact Information: [phone] Anticipated Caregiver: Dr [name] Hageman Anticipated Caregiver's Contact Information: Wife and caregiver 24/7 Caregiver Availability: 24/7 Discharge Plan Discussed with Primary Caregiver: Yes Is Caregiver In Agreement with Plan?: Yes Does Caregiver/Family have Issues with Lodging/Transportation while Pt is in Rehab?: No   Goals Patient/Family Goal for Rehab: PT/OT/SLP Mod I Expected length of stay: 5-7 days Pt/Family Agrees to Admission and willing to participate: Yes Program Orientation Provided & Reviewed with Pt/Caregiver Including Roles  & Responsibilities: Yes   Decrease burden of Care through IP rehab admission: Not anticipated    Possible need for SNF placement upon discharge: Not anticipated   Patient Condition: I have reviewed medical records from [facility] , spoken with CM, and patient. I met with patient at the bedside for inpatient rehabilitation assessment.  Patient will benefit from ongoing PT, OT, and SLP, can actively participate in 3 hours of therapy a day 5 days of the week, and can make measurable gains during the admission.  Patient will also benefit from the coordinated team approach during an Inpatient Acute Rehabilitation admission.  The patient will receive intensive therapy as well  as Rehabilitation physician, nursing, social worker, and care management interventions.  Due to safety, skin/wound care, disease management, medication administration, pain management, and patient education the patient requires 24 hour a day rehabilitation nursing.  The patient is currently CGA  with mobility and basic ADLs.  Discharge setting and therapy post discharge at home with home health is anticipated.  Patient has agreed to participate in the Acute Inpatient Rehabilitation Program and will admit today.   Preadmission Screen Completed By:  Leita KATHEE Kleine, 11/28/2023 10:48 AM ______________________________________________________________________   Discussed status with Dr. Urbano on 11/28/23 at 900 and received approval for admission today.   Admission Coordinator:  Leita KATHEE Kleine, CCC-SLP, time 1051/Date 11/28/23    Assessment/Plan: Diagnosis: Fall with Displaced odontoid fracture Does the need for close, 24 hr/day Medical supervision in concert with the patient's rehab needs make it unreasonable for this patient to be served in a less intensive setting? Yes Co-Morbidities requiring supervision/potential complications: diastolic HF, AKI, LE edema, CVA hx, HTN, Anemia, L knee pain, HLD, hx of ruptured aortic aneurysm/dissection Due to bladder management, bowel management, safety, skin/wound care, disease  management, medication administration, pain management, and patient education, does the patient require 24 hr/day rehab nursing? Yes Does the patient require coordinated care of a physician, rehab nurse, PT, OT, and SLP to address physical and functional deficits in the context of the above medical diagnosis(es)? Yes Addressing deficits in the following areas: balance, endurance, locomotion, strength, transferring, bowel/bladder control, bathing, dressing, feeding, grooming, toileting, swallowing, and psychosocial support Can the patient actively participate in an intensive therapy program of at least 3 hrs of therapy 5 days a week? Yes The potential for patient to make measurable gains while on inpatient rehab is excellent Anticipated functional outcomes upon discharge from inpatient rehab: modified independent PT, modified independent OT, modified independent SLP Estimated rehab length of stay to reach the above functional goals is: 5-7 days Anticipated discharge destination: Home 10. Overall Rehab/Functional Prognosis: excellent     MD Signature: Murray Zachary          Revision History

## 2023-11-29 NOTE — IPOC Note (Addendum)
 Overall Plan of Care [facility]) Patient Details Name: Zachary Underwood MRN: [id number] DOB: [birth year]  Admitting Diagnosis: Cervical spine fracture [facility])  Hospital Problems: Principal Problem:   Cervical spine fracture (HCC) Active Problems:   Left knee pain   Dysphagia   Anemia   Chronic diastolic congestive heart failure (HCC)     Functional Problem List: Nursing Bladder, Bowel, Edema, Endurance, Medication Management, Motor, Pain, Perception, Safety, Skin Integrity  PT Balance, Endurance, Motor, Pain, Safety  OT Balance, Endurance, Motor, Safety  SLP Cognition, Nutrition, Safety, Endurance  TR         Basic ADL's: OT Bathing, Dressing, Toileting     Advanced  ADL's: OT None     Transfers: PT Bed Mobility, Bed to Chair, Car, Occupational psychologist, Research scientist (life sciences): PT Ambulation, Stairs     Additional Impairments: OT None  SLP Swallowing, Social Cognition   Memory  TR      Anticipated Outcomes Item Anticipated Outcome  Self Feeding no goal  Swallowing  modI   Basic self-care  mod I  Toileting  mod I   Bathroom Transfers mod I  Bowel/Bladder  manage bowels with medications/ manage bladder with toileting assistance  Transfers  Supervision  Locomotion  Supervision  Communication  modI  Cognition  supervision  Pain  <4 w/ prns  Safety/Judgment  Manage safety with mod I assistance   Therapy Plan: PT Intensity: Minimum of 1-2 x/day ,45 to 90 minutes PT Frequency: 5 out of 7 days PT Duration Estimated Length of Stay: 7-10 Days OT Intensity: Minimum of 1-2 x/day, 45 to 90 minutes OT Frequency: 5 out of 7 days OT Duration/Estimated Length of Stay: 7-10 days SLP Intensity: Minumum of 1-2 x/day, 30 to 90 minutes SLP Frequency: 3 to 5 out of 7 days SLP Duration/Estimated Length of Stay: 7-10 days   Team Interventions: Nursing Interventions Patient/Family Education, Medication Management, Bladder Management, Bowel Management, Disease  Management/Prevention, Pain Management, Discharge Planning, Dysphagia/Aspiration Precaution Training, Skin Care/Wound Management  PT interventions Ambulation/gait training, Community reintegration, DME/adaptive equipment instruction, Psychosocial support, Museum/gallery curator, UE/LE Strength taining/ROM, Warden/ranger, Neuromuscular re-education, Discharge planning, Pain management, Therapeutic Activities, UE/LE Coordination activities, Functional mobility training, Patient/family education, Therapeutic Exercise  OT Interventions Warden/ranger, Patient/family education, Therapeutic Activities, Therapeutic Exercise, Community reintegration, Functional mobility training, Self Care/advanced ADL retraining, Discharge planning, Skin care/wound managment, UE/LE Coordination activities, DME/adaptive equipment instruction, Wheelchair propulsion/positioning  SLP Interventions Speech/Language facilitation, Cognitive remediation/compensation, Cueing hierarchy, Functional tasks, Therapeutic Activities, Therapeutic Exercise, Oral motor exercises, Patient/family education, Dysphagia/aspiration precaution training  TR Interventions    SW/CM Interventions Discharge Planning, Psychosocial Support, Patient/Family Education   Barriers to Discharge MD  Medical stability, Home enviroment access/loayout, Weight bearing restrictions, Pending surgery, and Nutritional means  Nursing Decreased caregiver support, Home environment access/layout, Incontinence Discharge: House  Discharge Home Layout: Multi-level, Able to live on main level with bedroom/bathroom  Alternate Level Stairs-Rails: Right, Left  Alternate Level Stairs-Number of Steps: chair lift  Discharge Home Access: Stairs to enter  Entrance Stairs-Rails: Right, Left  Entrance Stairs-Number of Steps: 3  PT Home environment access/layout    OT None    SLP      SW Decreased caregiver support, Lack of/limited family support     Team Discharge  Planning: Destination: PT-Home ,OT- Home , SLP-Home Projected Follow-up: PT-Outpatient PT, OT-  Outpatient OT, SLP-Outpatient SLP Projected Equipment Needs: PT-To be determined, OT- To be determined, SLP-None recommended by SLP Equipment Details: PT- , OT-  Patient/family involved in discharge planning: PT- Patient,  OT-Patient, Family member/caregiver, SLP-Patient  MD ELOS: 5-7 days Medical Rehab Prognosis:  Good Assessment: The patient has been admitted for CIR therapies with the diagnosis of type II posterior displaced odontoid fracture after a fall . The team will be addressing functional mobility, strength, stamina, balance, safety, adaptive techniques and equipment, self-care, bowel and bladder mgt, patient and caregiver education,. Goals have been set at Mod I. Anticipated discharge destination is home.       See Team Conference Notes for weekly updates to the plan of care

## 2023-11-29 NOTE — Progress Notes (Addendum)
 PROGRESS NOTE   Subjective/Complaints:  Hypertensive overnight, 160s over 80s, other vitals are stable.  No complaints of pain.  He denies any side effects of morning. Labs significant for hypernatremia 147, BUN/creatinine with slight uptrend of 47/1.58.  Continent of bowel and bladder, last bowel movement this a.m., small.  -Discussed patient care with wife as below  ROS: Denies fevers, chills, N/V, abdominal pain, constipation, diarrhea, SOB, cough, chest pain, new weakness or paraesthesias.   + Dysphagia + Hard of hearing  Objective:   DG Cervical Spine 2 or 3 views Result Date: 11/28/2023 CLINICAL DATA:  Fracture. EXAM: CERVICAL SPINE - 2-3 VIEW COMPARISON:  CT 11/22/2023 FINDINGS: Displaced dens fracture with 12 mm posterior displacement of the proximal fracture fragment. No significant change in alignment from prior exam. The C1 fractures on prior CT are not well demonstrated by radiograph. Similar anterolisthesis of C4 on C5 and C5 on C6. Prevertebral soft tissue thickening is seen at the fracture site. AP views are limited due to osseous overlap. IMPRESSION: 1. Displaced dens fracture with 12 mm posterior displacement of the proximal fracture fragment. No significant change in alignment from prior exam. 2. C1 fractures on prior CT are not well demonstrated by radiograph. Electronically Signed   By: Andrea Gasman M.D.   On: 11/28/2023 14:24   Recent Labs    11/28/23 0541 11/29/23 0549  WBC 7.2 7.6  HGB 10.7* 10.8*  HCT 33.2* 34.3*  PLT 177 191   Recent Labs    11/28/23 0541 11/29/23 0549  NA 141 147*  K 4.0 4.2  CL 107 109  CO2 23 24  GLUCOSE 114* 101*  BUN 42* 47*  CREATININE 1.52* 1.58*  CALCIUM  9.5 9.6    Intake/Output Summary (Last 24 hours) at 11/29/2023 0945 Last data filed at 11/28/2023 2138 Gross per 24 hour  Intake 540 ml  Output --  Net 540 ml        Physical Exam: Vital Signs Blood  pressure (!) 163/58, pulse 64, temperature 97.6 F (36.4 C), resp. rate 18, height 5' 8 (1.727 m), weight 84 kg, SpO2 99%.    General: No apparent distress.  Reclining in bed. HEENT: Head is normocephalic, hearing aids in place. + Hard cervical collar. Neck: Supple without JVD or lymphadenopathy Heart: Reg rate and rhythm.  No obvious murmurs, rubs, gallops. Chest: CTA bilaterally without wheezes, rales, or rhonchi; no distress.  Intermittent, wet throat clearing. Abdomen: Soft, non-tender, non-distended, bowel sounds positive. Extremities: No clubbing, cyanosis. Pulses are 2+.  Right upper extremity edema 1+ Psych: Pt's affect is appropriate. Pt is cooperative  Skin: Clean and intact without signs of breakdown  L shoulder- Chronic Lipoma + Right anterior scalp laceration, dried blood, covered in nonadherent dressing + Some mild edema and bruising in right forearm  Neuro:     Mental Status: Awake, alert, and oriented to self, place, and time with cues. + Mild cognitive delay, but appropriate content.  No obvious cognitive deficits. Speech/Languate: Intact CRANIAL NERVES: 2 through 12 grossly intact     MOTOR: Unchanged 8-8 RUE: 2/5 Deltoid, 4/5 Biceps, 4/5 Triceps,4/5 Grip LUE: 2/5 Deltoid, 4/5 Biceps, 4/5 Triceps, 4/5 Grip RLE: HF  4/5, KE 4/5, ADF 4/5, APF 4/5 LLE: HF 4/5, KE 4/5, ADF 4/5, APF 4/5     SENSORY: Normal to touch all 4 extremities   Coordination: No ataxia or dsymetria noted in bilateral upper or lower extremities   MSK: Right shoulder abduction limited to less than 90 degrees; crepitus on passive range of motion.  Assessment/Plan: 1. Functional deficits which require 3+ hours per day of interdisciplinary therapy in a comprehensive inpatient rehab setting. Physiatrist is providing close team supervision and 24 hour management of active medical problems listed below. Physiatrist and rehab team continue to assess barriers to discharge/monitor patient progress  toward functional and medical goals  Care Tool:  Bathing              Bathing assist       Upper Body Dressing/Undressing Upper body dressing        Upper body assist      Lower Body Dressing/Undressing Lower body dressing            Lower body assist       Toileting Toileting    Toileting assist Assist for toileting: Moderate Assistance - Patient 50 - 74%     Transfers Chair/bed transfer  Transfers assist     Chair/bed transfer assist level: Moderate Assistance - Patient 50 - 74%     Locomotion Ambulation   Ambulation assist              Walk 10 feet activity   Assist           Walk 50 feet activity   Assist           Walk 150 feet activity   Assist           Walk 10 feet on uneven surface  activity   Assist           Wheelchair     Assist               Wheelchair 50 feet with 2 turns activity    Assist            Wheelchair 150 feet activity     Assist          Blood pressure (!) 163/58, pulse 64, temperature 97.6 F (36.4 C), resp. rate 18, height 5' 8 (1.727 m), weight 84 kg, SpO2 99%.  Medical Problem List and Plan: 1. Functional deficits secondary to type II posterior displaced odontoid fracture after a fall             -patient may shower; maintain cervical collar 24/7             -ELOS/Goals: 5-7 days, PT/OT/SLP Mod I              -Pts wife would like to be notified before any medication changes; per discussion will plan to call every Tues/Thurs and PRN for medical updates    - 8/7: Dr. Burnetta with ortho spine provided second opinion; recommendations  - Hard cervical collar at all times.  - reevaluate the fracture with x-rays in 4 weeks. - - Recommend he continue to use Celebrex  and muscle relaxer as this is controlling his pain.  - follow-up with me in 4 weeks.  - Will provide list of PRN medications to patient   2.  Antithrombotics: -DVT/anticoagulation:   Mechanical: Sequential compression devices, below knee Bilateral lower extremities Pharmaceutical: Xarelto              -antiplatelet  therapy: N/A  3. Pain Management: Celebrex  (monitor Cr) Robaxin , oxycodone  as needed             - Left knee pain-received cortisone injection by orthopedics  - 8-8: Switch to tramadol  50 mg for moderate pain and oxycodone  2.5 mg for severe pain every 6 hours as needed.  Continue Robaxin , Celebrex .  Nursing to inquire about location of cold packs patient had on prior floor.   4. Mood/Behavior/Sleep: LCSW to follow for evaluation and support when available.              -antipsychotic agents: N/A  - 8-8.  Benadryl  25 mg nightly as needed for sleep; per wife, patient uses this at home  5. Neuropsych/cognition: This patient is capable of making decisions on his own behalf.  -  6. Skin/Wound Care: routine pressure relief measures -[place] J collar in place, monitor for changes in character of skin  -scalp laceration present on admission --monitor for any drainage today, then would use saline to debride off current dressing if it remains dry.  7. Fluids/Electrolytes/Nutrition: monitor I/O and repeat labs in a.m.              - Dysphagia 3 with nectar thick liquids--continue aspiration precautions. Continue SLP   - 8/8: Eating 50 to 100% of meals; on  Free water protocol for hydration.  Albumin 2.9, will encourage p.o. intakes, appreciate nutrition assistance.  8. HTN: resumed home irbesartan --no BB due to bradycardia  - 8-8: Add as needed hydralazine  10 milligrams every 6 hours for SBP greater than 160, DBP greater than 100.  Increased blood pressure likely due to pain, monitor today may increase irbesartan  if renal function improves tomorrow.     11/29/2023    3:07 AM 11/28/2023    8:09 PM 11/28/2023    4:59 PM  Vitals with BMI  Height   5' 8  Weight   185 lbs 3 oz  BMI   28.16  Systolic 163 167 840  Diastolic 58 81 58  Pulse 64 64 58    9.  Chronic  diastolic CHF/chronic lower extremity edema: patient's wife requested metolazone  and not Demadex .              -Currently on metolazone  2.5 mg daily, BNP 261-- wife says this CAN NOT BE REMOVED   - Daily weights and volume assessments. Filed Weights   11/28/23 1659  Weight: 84 kg    10. HLD: Continue home atorvastatin  11.  Mild anemia: Continue to monitor CBC recheck labs in a.m. 12.  AKI: Creatinine 1.38 on admission baseline approximately 1.0--trend creatinine. Last Cr 1.52. Recheck tomorrow.  - 8-8: Creatinine 1.5, BUN slightly increased.  Plan for IV fluids very gentle normal saline at 50 cc/h for 10 hours.  Repeat BMP in AM.  Did discuss with wife that if AKI does not improve, may need to adjust scheduled Celebrex  to as needed.  13. Obesity             Body mass index is 30.3 kg/m.  14. Bradycardia/atrial fibrillation             -Pt was evaluated by cardiology, no indication for temporary pacing   - Regular rate  15. Hx of aortic aneurysm/dissection -f/u outpatient, BP control   -Blood pressure management as above, has as needed hydralazine   16. Aortic stenosis             -Repeat echo 6-12 months has been recommended 17. L knee  pain             -L knee injection completed by ortho 8/1--no current complaints of pain 18.  Right shoulder pain. 19.  Dysphagia/secretion management  - Mucinex  600 mg twice daily  - Robitussin DM as needed  - DC Claritin  10 mg daily  -8-8: Patient managing secretions well on current exam, no current rhonchi or wheezing, no concerns for aspiration.  Monitor oxygen and respiratory status closely.  Can add scopolamine patch if needed, but would hold off due to multiple anticholinergic medications unless absolutely needed.      LOS: 1 days A FACE TO FACE EVALUATION WAS PERFORMED  Joesph JAYSON Likes 11/29/2023, 9:45 AM

## 2023-11-29 NOTE — Progress Notes (Signed)
 Inpatient Rehabilitation  Patient information reviewed and entered into eRehab system by [facility]. Karen Kays., CCC/SLP, PPS Coordinator.  Information including medical coding, functional ability and quality indicators will be reviewed and updated through discharge.

## 2023-11-29 NOTE — Progress Notes (Signed)
 Patient ID: Zachary Underwood, male   DOB: 1928-01-28, 89 y.o.   MRN: [id number]  SW made effort to meet with pt to complete assessment, but pt had friend in room. SW will follow-up to complete.  1235- SW spoke with pt wife to introduce self, explain role, and discuss dichrge process. Pt wife confirms she will be with him at all times, but he will need to be able to get up and get to the bathroom on his own as she I snot able to physically assist him due to her own physical limitations (spinal cord issues). SW will follow-up with updates as available.   Graeme Jude, MSW, LCSW Office: [phone] Cell: [phone] Fax: [phone]

## 2023-11-29 NOTE — Progress Notes (Signed)
 Occupational Therapy Assessment and Plan  Patient Details  Name: Zachary Underwood MRN: [id number] Date of Birth: 22-Dec-1927  OT Diagnosis: acute pain and muscle weakness (generalized) Rehab Potential: Rehab Potential (ACUTE ONLY): Good ELOS: 7-10 days   Today's Date: 11/29/2023 OT Individual Time: 8952-8792 OT Individual Time Calculation (min): 80 min     Hospital Problem: Principal Problem:   Cervical spine fracture (HCC) Active Problems:   Left knee pain   Dysphagia   Anemia   Chronic diastolic congestive heart failure (HCC)   Past Medical History:  Past Medical History:  Diagnosis Date   Arthritis    Cardiac arrhythmia    Hypertension    Ruptured aortic aneurysm (HCC)    Stroke (HCC) 08/23/2021   Past Surgical History:  Past Surgical History:  Procedure Laterality Date   open heart surgery     ruptured aortic aneurysm repair      Assessment & Plan Clinical Impression: Zachary Underwood is a 89 year old male with a significant medical history persistent atrial fibrillation on Xarelto , history of CVA, moderate aortic stenosis, s/p surgical repair of ascending thoracic aorta, HTN, HLD, BPH, chronic lower extremity edema  who presented to [facility] on 11/22/2023 via EMS after experiencing a fall, striking his head and neck. Upon arrival, initial vital signs were stable. Laboratory tests revealed WBC 7.0, Hemoglobin 10.6, Platelets 170, Sodium 138, Potassium 3.9, BUN 127, Creatinine 1.38, and Bicarb 24. A CT scan of the head showed no acute cranial abnormalities but revealed a frontal parietal scalp laceration and hematoma, generalized cerebral atrophy, and no infarcts within the left cerebellar hemisphere. A CT maxillofacial scan showed no evidence of fracture, but a separate CT spine scan identified a type 2 odontoid fracture with approximately 12 mm posterior displacement relative to the body of C2, associated perivertebral soft tissue swelling, and possible epidural hematoma.     Dr. Lanis consulted was therefore requested  An MRI of the cervical spine showed sub-ligamentous hematoma but no spinal cord compression at C1-2. Conservative management was recommended, [place] -J collar to remain in place at all times. Prior to admission, Xarelto  was held, and the patient received K-centra in the ED. Cardiology was consulted for a.fib and bradycardia and indicated no need for temporary pacing as the patient remained asymptomatic. 2D echo showed EF of 70 to 75%, no regional WMA, mild mitral stenosis, moderate aortic valve stenosis, moderate dilation of ascending aorta measuring 47 mm. The patient exhibited dysarthria and swallowing deficits and was placed on a dysphagia 3 diet with nectar-thick liquids.  Patient had an MRI of his brain completed without acute CVA.  Patient was seen by Dr. Burnetta orthopedics, recommended continued nonoperative management.  Plan for repeat x-rays in 4 weeks.  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  He currently requires CGA with rolling walker and cueing for safety, min assist to stand and CGA to min assist for ambulation.Therapy evaluations completed due to patient decreased functional mobility was admitted for a comprehensive rehab program. Patient transferred to CIR on 11/28/2023 .    Patient currently requires min with basic self-care skills secondary to muscle weakness, decreased cardiorespiratoy endurance, and decreased standing balance, decreased postural control, and decreased balance strategies.  Prior to hospitalization, patient could complete ADLs with modified independent .  Patient will benefit from skilled intervention to increase independence with basic self-care skills prior to discharge home with care partner.  Anticipate patient will require intermittent supervision and follow up  outpatient.  OT - End of Session Activity Tolerance: Tolerates 30+ min activity with multiple rests Endurance Deficit:  Yes OT Assessment Rehab Potential (ACUTE ONLY): Good OT Barriers to Discharge: None OT Patient demonstrates impairments in the following area(s): Balance;Endurance;Motor;Safety OT Basic ADL's Functional Problem(s): Bathing;Dressing;Toileting OT Advanced ADL's Functional Problem(s): None OT Transfers Functional Problem(s): Toilet;Tub/Shower OT Additional Impairment(s): None OT Plan OT Intensity: Minimum of 1-2 x/day, 45 to 90 minutes OT Frequency: 5 out of 7 days OT Duration/Estimated Length of Stay: 7-10 days OT Treatment/Interventions: Metallurgist training;Patient/family education;Therapeutic Activities;Therapeutic Exercise;Community reintegration;Functional mobility training;Self Care/advanced ADL retraining;Discharge planning;Skin care/wound managment;UE/LE Coordination activities;DME/adaptive equipment instruction;Wheelchair propulsion/positioning OT Self Feeding Anticipated Outcome(s): no goal OT Basic Self-Care Anticipated Outcome(s): mod I OT Toileting Anticipated Outcome(s): mod I OT Bathroom Transfers Anticipated Outcome(s): mod I OT Recommendation Recommendations for Other Services: None Patient destination: Home Follow Up Recommendations: Outpatient OT Equipment Recommended: To be determined   OT Evaluation Precautions/Restrictions  Precautions Precautions: Fall;Cervical Required Braces or Orthoses: Cervical Brace Cervical Brace: At all times;Hard collar Restrictions Weight Bearing Restrictions Per Provider Order: No General Chart Reviewed: Yes Family/Caregiver Present: No  Pain Pain Assessment Pain Scale: 0-10 Pain Score: 0-No pain Home Living/Prior Functioning Home Living Living Arrangements: Spouse/significant other Vision Baseline Vision/History: 0 No visual deficits Ability to See in Adequate Light: 0 Adequate Patient Visual Report: No change from baseline Vision Assessment?: No apparent visual deficits Perception  Perception: Within Functional  Limits Praxis Praxis: WFL Cognition Cognition Overall Cognitive Status: Within Functional Limits for tasks assessed Arousal/Alertness: Awake/alert Orientation Level: Person;Place;Situation Person: Oriented Place: Oriented Situation: Oriented Memory: Appears intact Awareness: Appears intact Problem Solving: Appears intact Safety/Judgment: Appears intact Brief Interview for Mental Status (BIMS) Repetition of Three Words (First Attempt): 3 Temporal Orientation: Year: Correct Temporal Orientation: Month: Accurate within 5 days Temporal Orientation: Day: Correct Recall: Sock: Yes, no cue required Recall: Blue: Yes, no cue required Recall: Bed: No, could not recall BIMS Summary Score: 13 Sensation Sensation Light Touch: Impaired Detail Central sensation comments: Occasional tingling through BUE Coordination Gross Motor Movements are Fluid and Coordinated: No Fine Motor Movements are Fluid and Coordinated: No Coordination and Movement Description: generalized deconditioning Finger Nose Finger Test: WNL Motor  Motor Motor: Other (comment) Motor - Skilled Clinical Observations: generalized weakness  Trunk/Postural Assessment  Cervical Assessment Cervical Assessment: Exceptions to [facility] (cervical collar) Thoracic Assessment Thoracic Assessment: Exceptions to [facility] (rounded shoulders, kyphotic posture) Lumbar Assessment Lumbar Assessment: Exceptions to [facility] (posterior pelvic tilt) Postural Control Postural Control: Deficits on evaluation Righting Reactions: delayed  Balance Balance Balance Assessed: Yes Static Sitting Balance Static Sitting - Balance Support: Feet supported Static Sitting - Level of Assistance: 7: Independent Dynamic Sitting Balance Dynamic Sitting - Balance Support: Feet supported Dynamic Sitting - Level of Assistance: 6: Modified independent (Device/Increase time) Static Standing Balance Static Standing - Balance Support: During functional  activity;Bilateral upper extremity supported Static Standing - Level of Assistance: 4: Min assist Dynamic Standing Balance Dynamic Standing - Balance Support: During functional activity;Bilateral upper extremity supported Dynamic Standing - Level of Assistance: 4: Min assist Extremity/Trunk Assessment RUE Assessment RUE Assessment: Exceptions to [facility] General Strength Comments: 4-/5. About 100 degrees flex RUE Body System: Ortho LUE Assessment LUE Assessment: Exceptions to [facility] General Strength Comments: 4-/5. About 90 degrees flex LUE Body System: Ortho  Care Tool Care Tool Self Care Eating   Eating Assist Level: Supervision/Verbal cueing    Oral Care    Oral Care Assist Level: Supervision/Verbal cueing    Bathing  Body parts bathed by patient: Right arm;Left arm;Abdomen;Chest;Front perineal area;Buttocks;Right upper leg;Left upper leg;Face Body parts bathed by helper: Right lower leg;Left lower leg   Assist Level: Minimal Assistance - Patient > 75%    Upper Body Dressing(including orthotics)   What is the patient wearing?: Hospital gown only   Assist Level: Minimal Assistance - Patient > 75%    Lower Body Dressing (excluding footwear) Lower body dressing activity did not occur: Refused        Putting on/Taking off footwear   What is the patient wearing?: Non-skid slipper socks Assist for footwear: Moderate Assistance - Patient 50 - 74%       Care Tool Toileting Toileting activity   Assist for toileting: Minimal Assistance - Patient > 75%     Care Tool Bed Mobility Roll left and right activity   Roll left and right assist level: Supervision/Verbal cueing    Sit to lying activity   Sit to lying assist level: Supervision/Verbal cueing    Lying to sitting on side of bed activity   Lying to sitting on side of bed assist level: the ability to move from lying on the back to sitting on the side of the bed with no back support.: Supervision/Verbal cueing     Care  Tool Transfers Sit to stand transfer   Sit to stand assist level: Minimal Assistance - Patient > 75%    Chair/bed transfer   Chair/bed transfer assist level: Minimal Assistance - Patient > 75%     Toilet transfer   Assist Level: Minimal Assistance - Patient > 75%     Care Tool Cognition  Expression of Ideas and Wants Expression of Ideas and Wants: 4. Without difficulty (complex and basic) - expresses complex messages without difficulty and with speech that is clear and easy to understand  Understanding Verbal and Non-Verbal Content Understanding Verbal and Non-Verbal Content: 4. Understands (complex and basic) - clear comprehension without cues or repetitions   Memory/Recall Ability Memory/Recall Ability : Current season;Staff names and faces;Location of own room;That he or she is in a hospital/hospital unit   Refer to Care Plan for Long Term Goals  SHORT TERM GOAL WEEK 1 OT Short Term Goal 1 (Week 1): STG= LTG d/t ELOS  Recommendations for other services: None    Skilled Therapeutic Intervention ADL ADL Eating: Supervision/safety Where Assessed-Eating: Chair Grooming: Supervision/safety Where Assessed-Grooming: Chair Upper Body Bathing: Supervision/safety Where Assessed-Upper Body Bathing: Sitting at sink Lower Body Bathing: Minimal assistance Where Assessed-Lower Body Bathing: Sitting at sink Upper Body Dressing: Minimal assistance Where Assessed-Upper Body Dressing: Sitting at sink Lower Body Dressing: Moderate assistance Where Assessed-Lower Body Dressing: Sitting at sink Toileting: Minimal assistance Where Assessed-Toileting: IT consultant Method: Insurance claims handler: Unable to assess Mobility  Bed Mobility Bed Mobility: Sit to Supine;Supine to Sit Supine to Sit: Contact Guard/Touching assist Transfers Sit to Stand: Minimal Assistance - Patient > 75% Stand to Sit: Minimal Assistance - Patient > 75%  Skilled  OT evaluation completed with the creation of pt centered OT POC. Pt educated on condition, ELOS, rehab expectations, and fall risk reduction strategies throughout session. Pt completed sink level bathing and dressing. He completed toileting tasks with min A. He completed 150 ft of functional mobility with CGA using the RW. He was left sitting up in the recliner with all needs met.   Discharge Criteria: Patient will be discharged from OT if patient refuses treatment 3 consecutive times without medical reason, if treatment goals  not met, if there is a change in medical status, if patient makes no progress towards goals or if patient is discharged from hospital.  The above assessment, treatment plan, treatment alternatives and goals were discussed and mutually agreed upon: by patient and by family  Nena VEAR Moats 11/29/2023, 12:56 PM

## 2023-11-29 NOTE — Progress Notes (Signed)
 Went to room to review current situation, team conference and plan of care. Patient was sleepy after therapy. Will revisit to provide more education to facilitate preparation for discharge.

## 2023-11-29 NOTE — Plan of Care (Signed)
  Problem: RH Swallowing Goal: LTG Patient will consume least restrictive diet using compensatory strategies with assistance (SLP) Description: LTG:  Patient will consume least restrictive diet using compensatory strategies with assistance (SLP) Flowsheets (Taken 11/29/2023 2037) LTG: Pt Patient will consume least restrictive diet using compensatory strategies with assistance of (SLP): Modified Independent Goal: LTG Pt will demonstrate functional change in swallow as evidenced by bedside/clinical objective assessment (SLP) Description: LTG: Patient will demonstrate functional change in swallow as evidenced by bedside/clinical objective assessment (SLP) Flowsheets (Taken 11/29/2023 2037) LTG: Patient will demonstrate functional change in swallow as evidenced by bedside/clinical objective assessment: Oropharyngeal swallow   Problem: RH Pre-functional/Other (Specify) Goal: RH LTG SLP (Specify) 1 Description: RH LTG SLP (Specify) 1 Flowsheets (Taken 11/29/2023 2037) LTG: Other SLP (Specify) 1: Pt will demonstrate understanding of education re motor speech production as judged by SLP   Problem: RH Pre-functional/Other (Specify) Goal: RH LTG SLP (Specify) 2 Description: RH LTG SLP (Specify) 2 Flowsheets (Taken 11/29/2023 2039) LTG: Other SLP (Specify) 2: Pt will complete pharyngeal strengthening exercises @ modI

## 2023-11-30 ENCOUNTER — Inpatient Hospital Stay (HOSPITAL_COMMUNITY)

## 2023-11-30 DIAGNOSIS — S12190D Other displaced fracture of second cervical vertebra, subsequent encounter for fracture with routine healing: Secondary | ICD-10-CM | POA: Diagnosis not present

## 2023-11-30 DIAGNOSIS — R519 Headache, unspecified: Secondary | ICD-10-CM

## 2023-11-30 DIAGNOSIS — T7840XD Allergy, unspecified, subsequent encounter: Secondary | ICD-10-CM

## 2023-11-30 DIAGNOSIS — F419 Anxiety disorder, unspecified: Secondary | ICD-10-CM | POA: Diagnosis not present

## 2023-11-30 DIAGNOSIS — N179 Acute kidney failure, unspecified: Secondary | ICD-10-CM | POA: Diagnosis not present

## 2023-11-30 LAB — BASIC METABOLIC PANEL WITH GFR
Anion gap: 10 (ref 5–15)
BUN: 41 mg/dL — ABNORMAL HIGH (ref 8–23)
CO2: 21 mmol/L — ABNORMAL LOW (ref 22–32)
Calcium: 9.3 mg/dL (ref 8.9–10.3)
Chloride: 110 mmol/L (ref 98–111)
Creatinine, Ser: 1.51 mg/dL — ABNORMAL HIGH (ref 0.61–1.24)
GFR, Estimated: 43 mL/min — ABNORMAL LOW
Glucose, Bld: 104 mg/dL — ABNORMAL HIGH (ref 70–99)
Potassium: 3.8 mmol/L (ref 3.5–5.1)
Sodium: 141 mmol/L (ref 135–145)

## 2023-11-30 MED ORDER — ALPRAZOLAM 0.25 MG PO TABS
0.2500 mg | ORAL_TABLET | Freq: Three times a day (TID) | ORAL | Status: DC | PRN
  Administered 2023-11-30 – 2023-12-01 (×3): 0.25 mg via ORAL
  Filled 2023-11-30 (×3): qty 1

## 2023-11-30 MED ORDER — CELECOXIB 100 MG PO CAPS
200.0000 mg | ORAL_CAPSULE | Freq: Two times a day (BID) | ORAL | Status: DC | PRN
  Administered 2023-12-01 – 2023-12-03 (×5): 200 mg via ORAL
  Filled 2023-11-30 (×3): qty 2

## 2023-11-30 MED ORDER — LORATADINE 10 MG PO TABS
10.0000 mg | ORAL_TABLET | Freq: Every day | ORAL | Status: DC
  Administered 2023-11-30 – 2023-12-11 (×15): 10 mg via ORAL
  Filled 2023-11-30 (×12): qty 1

## 2023-11-30 NOTE — Progress Notes (Signed)
 Occupational Therapy Session Note  Patient Details  Name: Zachary Underwood MRN: [id number] Date of Birth: May 20, 1927  Today's Date: 11/30/2023 OT Individual Time: 0700-0735 OT Individual Time Calculation (min): 35 min  and Today's Date: 11/30/2023 OT Missed Time: 20 Minutes Missed Time Reason: Patient ill (comment)  Short Term Goals: Week 1:  OT Short Term Goal 1 (Week 1): STG= LTG d/t ELOS  Skilled Therapeutic Interventions/Progress Updates:   Pt greeted resting in bed, reports of SOB post bed mobility with nursing staff. Pt requires increased cuing for alertness, falling into/out of sleep during start of session. OT takes time to assess HR & O2 at rest, pt demo's increased variability with both measurements when resting eyes closed. HR consisted with chronic bradycardia. O2 does desat to mid-high 80s Spo2. Attempted to engage patient in OOB ADLs, pt requires re-education on OT role/POC. Without trigger, pt then begins to have audible SOB with panic-like presentation, a total of 3-4 episodes occur during short session. LPN assessing and present during x1 occurrence. BP=132/70. Pt reports similar symptomology during urination. Session ended early awaiting for charge nurse/MD assessment. Pt remained resting in bed with all immediate needs met, call bell within reach, and door open.   Therapy Documentation Precautions:  Precautions Precautions: Fall, Cervical Required Braces or Orthoses: Cervical Brace Cervical Brace: At all times, Hard collar Restrictions Weight Bearing Restrictions Per Provider Order: No   Therapy/Group: Individual Therapy  Nereida Habermann, OTR/L, MSOT  11/30/2023, 5:15 AM

## 2023-11-30 NOTE — Progress Notes (Addendum)
 Physical Therapy Session Note  Patient Details  Name: Zachary Underwood MRN: [id number] Date of Birth: [birth year]  Today's Date: 11/30/2023 PT Individual Time: 1105-1203 PT Individual Time Calculation (min): 58 min   Short Term Goals: Week 1:  PT Short Term Goal 1 (Week 1): STGs = LTGs  Skilled Therapeutic Interventions/Progress Updates:    Pt presents in day room seated EOM, handoff from OT. Pt denies pain but reporting feeling not so good today reporting feeling some anxiety surrounding intermittent episodes of SOB. Pt unable to report precipitating factors surrounding SOB but agreeable to PT, demonstrates short episode of SOB while discussing however recovers quickly and does not show any further episodes during session. Session focused on therapeutic activities for improved activity tolerance, upright tolerance, standing balance and transfer training. Pt completes ambulatory transfer to nustep and completes continuous activity with BUE/BLE for 10 min total L3 resistance with cues to maintain pace at 50 SPM for activity, pt listening to Mozart during activity for improved attention to task with pt occasionally demonstrating falling asleep requiring cues to attend. Pt then completes ambulatory transfer back to EOM and comes to sitting for rest break. Pt participates with therapeutic exercise in standing with and without UE support to promote upright tolerance and standing balance needed for functional tasks including: - standing marches x10 with UE support, x10 without UE support Pt requesting urgently to use restroom, ambulates to bathroom in day room that was occupied and when provided with alternative to ambulate back to room pt turns and sits in rollator with min assist as pt does not position rollator against wall prior to sitting however does lock brakes. Pt becoming more lethargic towards end of session and requires frequent cues to remain awake, RN present during session and aware reporting  recent medication administration and pt reporting lack of sleep. Pt then ambulates into bathroom once open, completes toilet transfer with min assist and continent of bladder, charted. Pt then ambulates back to room ~100' with rollator, shuffling gait noted with mod cues for increasing step height and length to decrease fall risk, CGA for task. Pt comes to sitting on EOB with CGA, completes bed mobility with supervision for sit to supine but requires min assist for repositioning in center of bed and +2 assist to boost towards HOB. Pt remains semi reclined in bed with NT present and bed alarm activated at end of session.   Therapy Documentation Precautions:  Precautions Precautions: Fall, Cervical Required Braces or Orthoses: Cervical Brace Cervical Brace: At all times, Hard collar Restrictions Weight Bearing Restrictions Per Provider Order: No    Therapy/Group: Individual Therapy  Reche Ohara PT, DPT 11/30/2023, 2:39 PM

## 2023-11-30 NOTE — Progress Notes (Signed)
 Occupational Therapy Session Note  Patient Details  Name: Zachary Underwood MRN: [id number] Date of Birth: 04-16-1928  Today's Date: 11/30/2023 OT Individual Time: 8599-8571 OT Individual Time Calculation (min): 28 min    Short Term Goals: Week 1:  OT Short Term Goal 1 (Week 1): STG= LTG d/t ELOS  Skilled Therapeutic Interventions/Progress Updates:    Pt greeted semi-reclined in bed with eyes closed, easy to wake and agreeable to OT treatment session. Pt completed bed mobility with min A, then nursing gave pt muscle relaxer in applesauce. Pt declined to participate in BADL tasks today, but ambulated to therapy gym with rollator and CGA with cues for safety using device. Addressed standing balance/endurance with standing alternating toe taps on small cones. 3 sets of 10 with rest breaks in between. Pt then participated in simple obstacle course weaving around cones with rollator and min cues to avoid obstacles. Pt then handoff to PT for next therapy session.  Therapy Documentation Precautions:  Precautions Precautions: Fall, Cervical Required Braces or Orthoses: Cervical Brace Cervical Brace: At all times, Hard collar Restrictions Weight Bearing Restrictions Per Provider Order: No Pain: Pain Assessment Pain Scale: 0-10 Pain Score: 5  Rest and repositioned   Therapy/Group: Individual Therapy  Sharyle GORMAN Pert 11/30/2023, 2:41 PM

## 2023-11-30 NOTE — Progress Notes (Addendum)
 PROGRESS NOTE   Subjective/Complaints: Pt reports some episodes of anxiety and shortness of breath. Occurs when changing position (sitting up or down) and urination. 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  A little bit of soreness around his right shoulder. Reports nasal congestion, can sometimes bring in his nose but not out his nose.  LBM today  ROS: Denies fevers, chills, N/V, abdominal pain, constipation, diarrhea,cough, chest pain, new weakness or paraesthesias.   + Dysphagia + Hard of hearing + Intermittent shortness of breath and anxiety + Head and neck soreness  Objective:   DG Cervical Spine 2 or 3 views Result Date: 11/28/2023 CLINICAL DATA:  Fracture. EXAM: CERVICAL SPINE - 2-3 VIEW COMPARISON:  CT 11/22/2023 FINDINGS: Displaced dens fracture with 12 mm posterior displacement of the proximal fracture fragment. No significant change in alignment from prior exam. The C1 fractures on prior CT are not well demonstrated by radiograph. Similar anterolisthesis of C4 on C5 and C5 on C6. Prevertebral soft tissue thickening is seen at the fracture site. AP views are limited due to osseous overlap. IMPRESSION: 1. Displaced dens fracture with 12 mm posterior displacement of the proximal fracture fragment. No significant change in alignment from prior exam. 2. C1 fractures on prior CT are not well demonstrated by radiograph. Electronically Signed   By: Andrea Gasman M.D.   On: 11/28/2023 14:24   Recent Labs    11/28/23 0541 11/29/23 0549  WBC 7.2 7.6  HGB 10.7* 10.8*  HCT 33.2* 34.3*  PLT 177 191   Recent Labs    11/29/23 0549 11/30/23 0516  NA 147* 141  K 4.2 3.8  CL 109 110  CO2 24 21*  GLUCOSE 101* 104*  BUN 47* 41*  CREATININE 1.58* 1.51*  CALCIUM  9.6 9.3    Intake/Output Summary (Last 24 hours) at 11/30/2023 0956 Last data filed at 11/29/2023  2100 Gross per 24 hour  Intake 120 ml  Output 400 ml  Net -280 ml        Physical Exam: Vital Signs Blood pressure (!) 117/57, pulse 60, temperature 97.9 F (36.6 C), resp. rate (!) 22, height 5' 8 (1.727 m), weight 84 kg, SpO2 98%.    General: No apparent distress.  Working with therapy, ambulating HEENT: Head is normocephalic, hearing aids in place. + Hard cervical collar. Neck: Supple without JVD or lymphadenopathy Heart: Reg rate and rhythm.  No obvious murmurs, rubs, gallops. Chest: CTA bilaterally without wheezes, rales, or rhonchi; no distress.   Abdomen: Soft, non-tender, non-distended, bowel sounds positive. Extremities: No clubbing, cyanosis. Pulses are 2+.  Right upper extremity edema 1+ Psych: Pt's affect is appropriate. Pt is cooperative  Skin: Clean and intact without signs of breakdown  L shoulder- Chronic Lipoma + Right anterior scalp laceration, dried blood, covered in nonadherent dressing + Some mild edema and bruising in right forearm Mild TTP in right lat  Neuro:     Mental Status: Awake, alert, and oriented to self, place, and time with cues. + Mild cognitive delay, but appropriate content.  No obvious cognitive deficits. Speech/Languate: Intact CRANIAL NERVES: 2 through 12  grossly intact     MOTOR:  RUE: 2/5 Deltoid, 4/5 Biceps, 4/5 Triceps,4/5 Grip LUE: 2/5 Deltoid, 4/5 Biceps, 4/5 Triceps, 4/5 Grip RLE: HF 4/5, KE 4/5, ADF 4/5, APF 4/5 LLE: HF 4/5, KE 4/5, ADF 4/5, APF 4/5     SENSORY: Normal to touch all 4 extremities   Coordination: No ataxia or dsymetria noted in bilateral upper or lower extremities   Prior neuro assessment is c/w today's exam 11/30/2023.   MSK: Right shoulder abduction limited to less than 90 degrees; crepitus on passive range of motion.  Assessment/Plan: 1. Functional deficits which require 3+ hours per day of interdisciplinary therapy in a comprehensive inpatient rehab setting. Physiatrist is providing close team  supervision and 24 hour management of active medical problems listed below. Physiatrist and rehab team continue to assess barriers to discharge/monitor patient progress toward functional and medical goals  Care Tool:  Bathing    Body parts bathed by patient: Right arm, Left arm, Abdomen, Chest, Front perineal area, Buttocks, Right upper leg, Left upper leg, Face   Body parts bathed by helper: Right lower leg, Left lower leg     Bathing assist Assist Level: Minimal Assistance - Patient > 75%     Upper Body Dressing/Undressing Upper body dressing   What is the patient wearing?: Hospital gown only    Upper body assist Assist Level: Minimal Assistance - Patient > 75%    Lower Body Dressing/Undressing Lower body dressing    Lower body dressing activity did not occur: Refused       Lower body assist       Toileting Toileting    Toileting assist Assist for toileting: Minimal Assistance - Patient > 75%     Transfers Chair/bed transfer  Transfers assist     Chair/bed transfer assist level: Minimal Assistance - Patient > 75%     Locomotion Ambulation   Ambulation assist      Assist level: Minimal Assistance - Patient > 75% Assistive device: Walker-rolling Max distance: 200'   Walk 10 feet activity   Assist     Assist level: Minimal Assistance - Patient > 75% Assistive device: Walker-rolling   Walk 50 feet activity   Assist    Assist level: Minimal Assistance - Patient > 75% Assistive device: Walker-rolling    Walk 150 feet activity   Assist    Assist level: Minimal Assistance - Patient > 75% Assistive device: Walker-rolling    Walk 10 feet on uneven surface  activity   Assist     Assist level: Minimal Assistance - Patient > 75% Assistive device: Walker-rolling   Wheelchair     Assist Is the patient using a wheelchair?: Yes Type of Wheelchair: Manual Wheelchair activity did not occur: Safety/medical concerns    Max  wheelchair distance: 150'    Wheelchair 50 feet with 2 turns activity    Assist        Assist Level: Dependent - Patient 0%   Wheelchair 150 feet activity     Assist      Assist Level: Dependent - Patient 0%   Blood pressure (!) 117/57, pulse 60, temperature 97.9 F (36.6 C), resp. rate (!) 22, height 5' 8 (1.727 m), weight 84 kg, SpO2 98%.  Medical Problem List and Plan: 1. Functional deficits secondary to type II posterior displaced odontoid fracture after a fall             -patient may shower; maintain cervical collar 24/7             -  ELOS/Goals: 5-7 days, PT/OT/SLP Mod I              -Pts wife would like to be notified before any medication changes; per discussion will plan to call every Tues/Thurs and PRN for medical updates    - 8/7: Dr. Burnetta with ortho spine provided second opinion; recommendations  - Hard cervical collar at all times.  - reevaluate the fracture with x-rays in 4 weeks. - - Recommend he continue to use Celebrex  and muscle relaxer as this is controlling his pain.  - follow-up with me in 4 weeks.  - Will provide list of PRN medications to patient   2.  Antithrombotics: -DVT/anticoagulation:  Mechanical: Sequential compression devices, below knee Bilateral lower extremities Pharmaceutical: Xarelto              -antiplatelet therapy: N/A  3. Pain Management: Celebrex  (monitor Cr) Robaxin , oxycodone  as needed             - Left knee pain-received cortisone injection by orthopedics  - 8-8: Switch to tramadol  50 mg for moderate pain and oxycodone  2.5 mg for severe pain every 6 hours as needed.  Continue Robaxin , Celebrex .  Nursing to inquire about location of cold packs patient had on prior floor.  8/9: Cold therapy Prn for shoulders, head   4. Mood/Behavior/Sleep: LCSW to follow for evaluation and support when available.              -antipsychotic agents: N/A  - 8-8.  Benadryl  25 mg nightly as needed for sleep; per wife, patient uses this at  home  5. Neuropsych/cognition: This patient is capable of making decisions on his own behalf.  -  6. Skin/Wound Care: routine pressure relief measures -[place] J collar in place, monitor for changes in character of skin  -scalp laceration present on admission --monitor for any drainage today, then would use saline to debride off current dressing if it remains dry.  7. Fluids/Electrolytes/Nutrition: monitor I/O and repeat labs in a.m.              - Dysphagia 3 with nectar thick liquids--continue aspiration precautions. Continue SLP   - 8/8: Eating 50 to 100% of meals; on  Free water protocol for hydration.  Albumin 2.9, will encourage p.o. intakes, appreciate nutrition assistance.  8. HTN: resumed home irbesartan --no BB due to bradycardia  - 8-8: Add as needed hydralazine  10 milligrams every 6 hours for SBP greater than 160, DBP greater than 100.  Increased blood pressure likely due to pain, monitor today may increase irbesartan  if renal function improves tomorrow.  -8/9 BP not elevated this morning.  Continue current regimen and monitor trend     11/30/2023    8:00 AM 11/30/2023    5:15 AM 11/29/2023    7:27 PM  Vitals with BMI  Systolic  117 159  Diastolic  57 57  Pulse 60 94 65    9.  Chronic diastolic CHF/chronic lower extremity edema: patient's wife requested metolazone  and not Demadex .              -Currently on metolazone  2.5 mg daily, BNP 261-- wife says this CAN NOT BE REMOVED   - Daily weights and volume assessments.  -Will ask nursing to get new wt reading [facility] Weights   11/28/23 1659  Weight: 84 kg    10. HLD: Continue home atorvastatin  11.  Mild anemia: Continue to monitor CBC recheck labs in a.m. 12.  AKI: Creatinine 1.38 on admission baseline approximately 1.0--trend  creatinine. Last Cr 1.52. Recheck tomorrow.  - 8-8: Creatinine 1.5, BUN slightly increased.  Plan for IV fluids very gentle normal saline at 50 cc/h for 10 hours.  Repeat BMP in AM.  Did discuss with wife  that if AKI does not improve, may need to adjust scheduled Celebrex  to as needed.  -8/9 Cr a little better at 1.51, change celebrex  to PRN 13. Obesity             Body mass index is 30.3 kg/m.  14. Bradycardia/atrial fibrillation             -Pt was evaluated by cardiology, no indication for temporary pacing   - Regular rate  -8/9 HR stable currently, nursing reports occasional bradycardic episodes at night, pt was evaluated by cardiology for this during acute admission  15. Hx of aortic aneurysm/dissection -f/u outpatient, BP control   -Blood pressure management as above, has as needed hydralazine   16. Aortic stenosis             -Repeat echo 6-12 months has been recommended 17. L knee pain             -L knee injection completed by ortho 8/1--no current complaints of pain 18.  Right shoulder pain. 19.  Dysphagia/secretion management  - Mucinex  600 mg twice daily  - Robitussin DM as needed  - DC Claritin  10 mg daily  -8-8: Patient managing secretions well on current exam, no current rhonchi or wheezing, no concerns for aspiration.  Monitor oxygen and respiratory status closely.  Can add scopolamine patch if needed, but would hold off due to multiple anticholinergic medications unless absolutely needed.  -8/9  Restart claritin -has allergies hx, discussed increased use of suction 20. Occasional SOB and Anxiety   -Suspect most likely anxiety but secretions could be contributing. Will check CXR-negative, Consider prn anxiety medication    -start xanax  0.25 PRN, discussed with his wife   -Schedule for neuropscyh- next available  LOS: 2 days A FACE TO FACE EVALUATION WAS PERFORMED  Murray Collier 11/30/2023, 9:56 AM

## 2023-11-30 NOTE — Progress Notes (Signed)
 Physical Therapy Session Note  Patient Details  Name: Zachary Underwood MRN: [id number] Date of Birth: 24-Jul-1927  Today's Date: 11/30/2023 PT Individual Time: 1105-1203 PT Individual Time Calculation (min): 58 min   Short Term Goals: Week 1:  PT Short Term Goal 1 (Week 1): STGs = LTGs  Skilled Therapeutic Interventions/Progress Updates: Patient sitting in recliner on entrance to room. Patient alert and agreeable to PT session.   Patient reported 3/10 pain on head (global). Nsg and MD notified. When back in room at end of session and pt in recliner, pt had sudden onset of fast/short breathing and pointed to groin. PTA cued pt to take deep breaths and onset subsided shortly after. NSG arrived and alerted to presentation (communicated with MD as well following).   Therapeutic Activity: Transfers: Pt performed sit<>stand transfers throughout session with supervision to RW/rollator. Provided VC for hand placement.  - Pt required BM and ambulated from recliner at beginning of session to toilet in RW with CGA for safety while PTA towed IV pole. Pt had BM (increased time required) and needed further assistance with posterior pericare (pt used toilet paper and PTA followed up with clean cloths with pt standing in RW). PTA donned brief with pt assisting anteriorly around waist.  - Pt ambulated from room<>main gym (started with RW, then moved to rollator at the end) with supervision and PTA towing IV pole/WC for safety due to pts earlier presentation with other therapy of SOB. Pt ambulated to day room mat with no issue. PTA provided pt with rollator as pt reports having one at home (adjusted to height). Pt then ambulated 230' x 1 with rest break required, then 87' with rest break provided around nsg/day room loop in rollator with pt safely sitting in rollator when needing break. Pt with no reports of SOB throughout. Pt cued to increase step clearance throughout trials.  SpO2 monitored after 230' gait trial.  98% SpO2 and 55 bpm HR.    Patient sitting in recliner at end of session with brakes locked, nsg obtaining ice packs per pt request, and all needs within reach.      Therapy Documentation Precautions:  Precautions Precautions: Fall, Cervical Required Braces or Orthoses: Cervical Brace Cervical Brace: At all times, Hard collar Restrictions Weight Bearing Restrictions Per Provider Order: No   Therapy/Group: Individual Therapy  Reid Nawrot PTA 11/30/2023, 12:14 PM

## 2023-11-30 NOTE — Progress Notes (Signed)
 Spouse requested that staff not to lower the [facility] flat due to [facility] being flat triggers an anxiety attack for the patient. She requested that this nurse document a note.

## 2023-12-01 DIAGNOSIS — T7840XD Allergy, unspecified, subsequent encounter: Secondary | ICD-10-CM | POA: Diagnosis not present

## 2023-12-01 DIAGNOSIS — N179 Acute kidney failure, unspecified: Secondary | ICD-10-CM | POA: Diagnosis not present

## 2023-12-01 DIAGNOSIS — M546 Pain in thoracic spine: Secondary | ICD-10-CM

## 2023-12-01 DIAGNOSIS — F419 Anxiety disorder, unspecified: Secondary | ICD-10-CM | POA: Diagnosis not present

## 2023-12-01 DIAGNOSIS — S12190D Other displaced fracture of second cervical vertebra, subsequent encounter for fracture with routine healing: Secondary | ICD-10-CM | POA: Diagnosis not present

## 2023-12-01 MED ORDER — ALPRAZOLAM 0.25 MG PO TABS: 0.1250 mg | ORAL_TABLET | Freq: Three times a day (TID) | ORAL | Status: DC | PRN

## 2023-12-01 MED ORDER — TRAMADOL HCL 50 MG PO TABS
50.0000 mg | ORAL_TABLET | Freq: Two times a day (BID) | ORAL | Status: DC
  Administered 2023-12-01 – 2023-12-02 (×3): 50 mg via ORAL
  Filled 2023-12-01 (×2): qty 1

## 2023-12-01 NOTE — Progress Notes (Signed)
 PROGRESS NOTE   Subjective/Complaints: Anxiety doing little better today.  Patient's wife feels like his voice was doing better yesterday after he had dose of Xanax  yesterday briefly.   LBM today  ROS: Denies fevers, chills, N/V, abdominal pain, constipation, diarrhea,cough, chest pain, new weakness or paraesthesias.   + Dysphagia + Hard of hearing + Intermittent shortness of breath and anxiety- improved + Head and neck soreness, right shoulder  soreness  Objective:   DG Chest 2 View Result Date: 11/30/2023 CLINICAL DATA:  Shortness of breath. EXAM: CHEST - 2 VIEW COMPARISON:  Radiograph 11/25/2023, CT 11/26/2023 FINDINGS: Prior median sternotomy. Cardiomegaly is stable. Mediastinal contours are unchanged. Small bilateral pleural effusions again seen. Improvement in pulmonary edema. No confluent opacity or pneumothorax. IMPRESSION: 1. Improvement in pulmonary edema. 2. Small bilateral pleural effusions. 3. Stable cardiomegaly. Electronically Signed   By: Andrea Gasman M.D.   On: 11/30/2023 11:16   Recent Labs    11/29/23 0549  WBC 7.6  HGB 10.8*  HCT 34.3*  PLT 191   Recent Labs    11/29/23 0549 11/30/23 0516  NA 147* 141  K 4.2 3.8  CL 109 110  CO2 24 21*  GLUCOSE 101* 104*  BUN 47* 41*  CREATININE 1.58* 1.51*  CALCIUM  9.6 9.3    Intake/Output Summary (Last 24 hours) at 12/01/2023 1640 Last data filed at 12/01/2023 9071 Gross per 24 hour  Intake 238 ml  Output --  Net 238 ml        Physical Exam: Vital Signs Blood pressure 133/67, pulse 61, temperature 98.6 F (37 C), temperature source Oral, resp. rate 16, height 5' 8 (1.727 m), weight 84 kg, SpO2 100%.    General: No apparent distress.  Sitting in chair reading his newspaper HEENT: Head is normocephalic, hearing aids in place. + Hard cervical collar. Neck: Supple without JVD or lymphadenopathy Heart: Reg rate and rhythm.  No obvious murmurs, rubs,  gallops. Chest: CTA bilaterally without wheezes, rales, or rhonchi; no distress.   Abdomen: Soft, non-tender, non-distended, bowel sounds positive. Extremities: No clubbing, cyanosis. Pulses are 2+.  Right upper extremity edema 1+ Psych: Pt's affect is appropriate. Pt is cooperative  Skin: Clean and intact without signs of breakdown  L shoulder- Chronic Lipoma + Right anterior scalp laceration, dried blood, covered in nonadherent dressing + Some mild edema and bruising in right forearm Mild TTP in right lat- continued  Neuro:     Mental Status: Awake, alert, and oriented to self, place, and time with cues. + Mild cognitive delay, but appropriate content.  No obvious cognitive deficits. Speech/Languate: Intact CRANIAL NERVES: 2 through 12 grossly intact Able to recite text from Shakespeare   MOTOR:  RUE: 2/5 Deltoid, 4/5 Biceps, 4/5 Triceps,4/5 Grip LUE: 2/5 Deltoid, 4/5 Biceps, 4/5 Triceps, 4/5 Grip RLE: HF 4/5, KE 4/5, ADF 4/5, APF 4/5 LLE: HF 4/5, KE 4/5, ADF 4/5, APF 4/5     SENSORY: Normal to touch all 4 extremities   Coordination: No ataxia or dsymetria noted in bilateral upper or lower extremities   Prior neuro assessment is c/w today's exam 12/01/2023.   MSK: Right shoulder abduction limited to less than 90 degrees;  crepitus on passive range of motion.  Assessment/Plan: 1. Functional deficits which require 3+ hours per day of interdisciplinary therapy in a comprehensive inpatient rehab setting. Physiatrist is providing close team supervision and 24 hour management of active medical problems listed below. Physiatrist and rehab team continue to assess barriers to discharge/monitor patient progress toward functional and medical goals  Care Tool:  Bathing    Body parts bathed by patient: Right arm, Left arm, Abdomen, Chest, Front perineal area, Buttocks, Right upper leg, Left upper leg, Face   Body parts bathed by helper: Right lower leg, Left lower leg      Bathing assist Assist Level: Minimal Assistance - Patient > 75%     Upper Body Dressing/Undressing Upper body dressing   What is the patient wearing?: Hospital gown only    Upper body assist Assist Level: Minimal Assistance - Patient > 75%    Lower Body Dressing/Undressing Lower body dressing    Lower body dressing activity did not occur: Refused       Lower body assist       Toileting Toileting    Toileting assist Assist for toileting: Minimal Assistance - Patient > 75%     Transfers Chair/bed transfer  Transfers assist     Chair/bed transfer assist level: Minimal Assistance - Patient > 75%     Locomotion Ambulation   Ambulation assist      Assist level: Minimal Assistance - Patient > 75% Assistive device: Walker-rolling Max distance: 200'   Walk 10 feet activity   Assist     Assist level: Minimal Assistance - Patient > 75% Assistive device: Walker-rolling   Walk 50 feet activity   Assist    Assist level: Minimal Assistance - Patient > 75% Assistive device: Walker-rolling    Walk 150 feet activity   Assist    Assist level: Minimal Assistance - Patient > 75% Assistive device: Walker-rolling    Walk 10 feet on uneven surface  activity   Assist     Assist level: Minimal Assistance - Patient > 75% Assistive device: Development worker, international aid     Assist Is the patient using a wheelchair?: Yes Type of Wheelchair: Manual Wheelchair activity did not occur: Safety/medical concerns    Max wheelchair distance: 150'    Wheelchair 50 feet with 2 turns activity    Assist        Assist Level: Dependent - Patient 0%   Wheelchair 150 feet activity     Assist      Assist Level: Dependent - Patient 0%   Blood pressure 133/67, pulse 61, temperature 98.6 F (37 C), temperature source Oral, resp. rate 16, height 5' 8 (1.727 m), weight 84 kg, SpO2 100%.  Medical Problem List and Plan: 1. Functional deficits  secondary to type II posterior displaced odontoid fracture after a fall             -patient may shower; maintain cervical collar 24/7             -ELOS/Goals: 5-7 days, PT/OT/SLP Mod I              -Pts wife would like to be notified before any medication changes; per discussion will plan to call every Tues/Thurs and PRN for medical updates    - 8/7: Dr. Burnetta with ortho spine provided second opinion; recommendations  - Hard cervical collar at all times.  - reevaluate the fracture with x-rays in 4 weeks. - - Recommend he continue to use Celebrex   and muscle relaxer as this is controlling his pain.  - follow-up with me in 4 weeks.  - Will provide list of PRN medications to patient   2.  Antithrombotics: -DVT/anticoagulation:  Mechanical: Sequential compression devices, below knee Bilateral lower extremities Pharmaceutical: Xarelto              -antiplatelet therapy: N/A  3. Pain Management: Celebrex  (monitor Cr) Robaxin , oxycodone  as needed             - Left knee pain-received cortisone injection by orthopedics  - 8-8: Switch to tramadol  50 mg for moderate pain and oxycodone  2.5 mg for severe pain every 6 hours as needed.  Continue Robaxin , Celebrex .  Nursing to inquire about location of cold packs patient had on prior floor.  8/9: Cold therapy Prn for shoulders, head  8/10 discussed with patient's wife, patient does not ask for tramadol  as needed.  Will schedule twice a day   4. Mood/Behavior/Sleep: LCSW to follow for evaluation and support when available.              -antipsychotic agents: N/A  - 8-8.  Benadryl  25 mg nightly as needed for sleep; per wife, patient uses this at home  5. Neuropsych/cognition: This patient is capable of making decisions on his own behalf.  -  6. Skin/Wound Care: routine pressure relief measures -[place] J collar in place, monitor for changes in character of skin  -scalp laceration present on admission --monitor for any drainage today, then would use  saline to debride off current dressing if it remains dry.  7. Fluids/Electrolytes/Nutrition: monitor I/O and repeat labs in a.m.              - Dysphagia 3 with nectar thick liquids--continue aspiration precautions. Continue SLP   - 8/8: Eating 50 to 100% of meals; on  Free water protocol for hydration.  Albumin 2.9, will encourage p.o. intakes, appreciate nutrition assistance.  8. HTN: resumed home irbesartan --no BB due to bradycardia  - 8-8: Add as needed hydralazine  10 milligrams every 6 hours for SBP greater than 160, DBP greater than 100.  Increased blood pressure likely due to pain, monitor today may increase irbesartan  if renal function improves tomorrow.  -8/10 BP a little labile, continue to monitor trend and continue current medications     12/01/2023    1:24 PM 12/01/2023    6:35 AM 11/30/2023    9:51 PM  Vitals with BMI  Systolic 133 150 895  Diastolic 67 74 92  Pulse 61 62 57    9.  Chronic diastolic CHF/chronic lower extremity edema: patient's wife requested metolazone  and not Demadex .              -Currently on metolazone  2.5 mg daily, BNP 261-- wife says this CAN NOT BE REMOVED   - Daily weights and volume assessments.  -Will ask nursing to get new wt reading [facility] Weights   11/28/23 1659  Weight: 84 kg    10. HLD: Continue home atorvastatin  11.  Mild anemia: Continue to monitor CBC recheck labs in a.m. 12.  AKI: Creatinine 1.38 on admission baseline approximately 1.0--trend creatinine. Last Cr 1.52. Recheck tomorrow.  - 8-8: Creatinine 1.5, BUN slightly increased.  Plan for IV fluids very gentle normal saline at 50 cc/h for 10 hours.  Repeat BMP in AM.  Did discuss with wife that if AKI does not improve, may need to adjust scheduled Celebrex  to as needed.  -8/9 Cr a little better at 1.51, change  celebrex  to PRN  -8/10 recheck BMP tomorrow 13. Obesity             Body mass index is 30.3 kg/m.  14. Bradycardia/atrial fibrillation             -Pt was evaluated by  cardiology, no indication for temporary pacing   - Regular rate  -8/9 HR stable currently, nursing reports occasional bradycardic episodes at night, pt was evaluated by cardiology for this during acute admission  15. Hx of aortic aneurysm/dissection -f/u outpatient, BP control   -Blood pressure management as above, has as needed hydralazine   16. Aortic stenosis             -Repeat echo 6-12 months has been recommended 17. L knee pain             -L knee injection completed by ortho 8/1--no current complaints of pain 18.  Right shoulder pain. 19.  Dysphagia/secretion management  - Mucinex  600 mg twice daily  - Robitussin DM as needed  - DC Claritin  10 mg daily  -8-8: Patient managing secretions well on current exam, no current rhonchi or wheezing, no concerns for aspiration.  Monitor oxygen and respiratory status closely.  Can add scopolamine patch if needed, but would hold off due to multiple anticholinergic medications unless absolutely needed.  -8/9  Restart claritin -has allergies hx, discussed increased use of suction  -8/10 nasal congestion no better 20. Occasional SOB and Anxiety   -Suspect most likely anxiety but secretions could be contributing. Will check CXR-negative for acute changes, Consider prn anxiety medication    -start xanax  0.25 PRN, discussed with his wife  - 43/10 patient's wife would like to have option for 0.125 dose of Xanax  as needed.  Order adjusted   -Schedule for neuropscyh- next available   LOS: 3 days A FACE TO FACE EVALUATION WAS PERFORMED  Murray Collier 12/01/2023, 4:40 PM

## 2023-12-01 NOTE — Progress Notes (Signed)
 Patient could not tolerate HOB low due to anxiety and he was not ready to get up to be weighed. Patient could not be weighed this shift.

## 2023-12-02 ENCOUNTER — Inpatient Hospital Stay (HOSPITAL_COMMUNITY)

## 2023-12-02 DIAGNOSIS — S12110S Anterior displaced Type II dens fracture, sequela: Secondary | ICD-10-CM | POA: Diagnosis not present

## 2023-12-02 LAB — HEPATIC FUNCTION PANEL
ALT: 17 U/L (ref 0–44)
AST: 21 U/L (ref 15–41)
Albumin: 3.2 g/dL — ABNORMAL LOW (ref 3.5–5.0)
Alkaline Phosphatase: 94 U/L (ref 38–126)
Bilirubin, Direct: <0.1 mg/dL (ref 0.0–0.2)
Total Bilirubin: 0.9 mg/dL (ref 0.0–1.2)
Total Protein: 6.3 g/dL — ABNORMAL LOW (ref 6.5–8.1)

## 2023-12-02 LAB — BASIC METABOLIC PANEL WITH GFR
Anion gap: 9 (ref 5–15)
BUN: 34 mg/dL — ABNORMAL HIGH (ref 8–23)
CO2: 23 mmol/L (ref 22–32)
Calcium: 9.5 mg/dL (ref 8.9–10.3)
Chloride: 110 mmol/L (ref 98–111)
Creatinine, Ser: 1.2 mg/dL (ref 0.61–1.24)
GFR, Estimated: 56 mL/min — ABNORMAL LOW (ref >60)
Glucose, Bld: 90 mg/dL (ref 70–99)
Potassium: 4.1 mmol/L (ref 3.5–5.1)
Sodium: 142 mmol/L (ref 135–145)

## 2023-12-02 LAB — CBC
HCT: 36.1 % — ABNORMAL LOW (ref 39.0–52.0)
Hemoglobin: 11.4 g/dL — ABNORMAL LOW (ref 13.0–17.0)
MCH: 30.3 pg (ref 26.0–34.0)
MCHC: 31.6 g/dL (ref 30.0–36.0)
MCV: 96 fL (ref 80.0–100.0)
Platelets: 191 K/uL (ref 150–400)
RBC: 3.76 MIL/uL — ABNORMAL LOW (ref 4.22–5.81)
RDW: 14.3 % (ref 11.5–15.5)
WBC: 8.7 K/uL (ref 4.0–10.5)
nRBC: 0 % (ref 0.0–0.2)

## 2023-12-02 MED ORDER — ALPRAZOLAM 0.25 MG PO TABS
0.1250 mg | ORAL_TABLET | Freq: Three times a day (TID) | ORAL | Status: DC | PRN
  Administered 2023-12-03 (×2): 0.125 mg via ORAL
  Filled 2023-12-02: qty 1

## 2023-12-02 MED ORDER — DEXAMETHASONE SODIUM PHOSPHATE 4 MG/ML IJ SOLN
4.0000 mg | Freq: Two times a day (BID) | INTRAMUSCULAR | Status: DC
  Administered 2023-12-02 – 2023-12-06 (×13): 4 mg via INTRAVENOUS
  Filled 2023-12-02 (×8): qty 1

## 2023-12-02 MED ORDER — TRAMADOL HCL 50 MG PO TABS: 50.0000 mg | ORAL_TABLET | Freq: Two times a day (BID) | ORAL | Status: DC | PRN

## 2023-12-02 NOTE — Progress Notes (Addendum)
 Patient admin dose of 2.5 oxycodone  as request for 9/10 left side headache. Patient states tramadol  is not effective today and refused ice packs. Patient also refusing lunch d/t head pain.

## 2023-12-02 NOTE — Progress Notes (Addendum)
 Physical Therapy Session Note  Patient Details  Name: Zachary Underwood MRN: [id number] Date of Birth: 1927-04-29  Today's Date: 12/02/2023 PT Individual Time: 9051-8899 PT Individual Time Calculation (min): 72 min   Short Term Goals: Week 1:  PT Short Term Goal 1 (Week 1): STGs = LTGs  Skilled Therapeutic Interventions/Progress Updates: Pt presents semi-reclined in bed and asleep but able to arouse although does fall back asleep.  With continued stim, pt remains awake.  Pt rolls to remove soiled brief, and then able to perform front pericare after handed washcloth.  Pt transferred w/ log roll to L w/ supervision.  Underpants, and shorts threaded over feet and assisted w/ donning shoes.  Pt transferred sit to stand w/ CGA to complete pulling up pants.  Pt donned button-up shirt shirt w/ set-up but required assist for buttoning.  Pt amb to BR w/ min/CGA, cues for safety and reaching for external support.  Pt continent of bowel and bladder (charted in Flowsheets).  Pt performed pericare but required completion by PT. Pt amb w/ rollator to sink for handwashing.  Pt amb multiple trials during session of 150' w/ rollator, cues for positioning w/in rollator and cadence.  Pt performed Nustep Level 3 x 8 maintaining 60 spm and then increased to Level 4 x 2.  Pt performed x 496 steps and O2 sats at 100%, HR 51.  Pt returned to room and remained sitting in recliner w/ seat alarm on and all needs in reach, ice pack given but did not wish on at this time.  Pt left w/ NP. Spouse phoned before therapy started and adamant about no briefs and importance of speech therapy.  Nursing aware.     Therapy Documentation Precautions:  Precautions Precautions: Fall, Cervical Required Braces or Orthoses: Cervical Brace Cervical Brace: At all times, Hard collar Restrictions Weight Bearing Restrictions Per Provider Order: No General:   Vital Signs:   Pain:5/10 HA Pain Assessment Pain Scale: 0-10 Pain Score: 5   Pain Location: Head Pain Intervention(s): Medication (See eMAR)    Therapy/Group: Individual Therapy  Carleton [place] P Jakyria Bleau 12/02/2023, 11:08 AM

## 2023-12-02 NOTE — Progress Notes (Signed)
 Occupational Therapy Session Note  Patient Details  Name: Zachary Underwood MRN: [id number] Date of Birth: 1927-11-30  Today's Date: 12/02/2023 OT Individual Time: 8567-8482 OT Individual Time Calculation (min): 45 min    Short Term Goals: Week 1:  OT Short Term Goal 1 (Week 1): STG= LTG d/t ELOS  Skilled Therapeutic Interventions/Progress Updates:    Pt received sitting reclined in the recliner with 3/10 pain in his L shoulder and 5/10 pain in his head. Cervical collar on. RN Rhoda entered room to provide a lidocaine  patch to his shoulder which he reports feels a whole lot better. Initially spent time assessing vitals with RN- in flowsheets. Notified PA/DO of bradycardia of 35-50 bpm, which is baseline. He was agreeable to OT session, declined bathing/dressing needs. He stood from the recliner with CGA using the rollator. CGA ambulatory transfer into the bathroom where he completed clothing management and peri hygiene with close (S) following continent BM and urine void. He then completed 120 ft of functional mobility to the therapy gym with the rollator with CGA, fading to (S). Remainder of session focused on challenging dynamic balance and functional activity tolerance for reduced fall risk and carryover to ADL transfers. He completed stepping over forward and then backward a 1x2 in threshold with min HHA. Min cueing provided for positioning. Pt required use of rest breaks throughout session for recovery, as well as to support safety and prevent overexertion. During breaks, OT monitored recovery time to assess endurance and response to exertion. He then completed functional activity stepping up and then down off a 4 in step with min-mod HHA. This was without his RW to grade task demands up. He requested OT talk to his wife on the phone. Provided overview of session details. [name] shared frustrations with nursing staff today and requested that OT put that pt does not have signs of trigeminal  neuralgia in note. Deferred as this is outside the scope of OT. Pt and wife happy with OT session and progress. Pt returned to his room following and was left sitting up in the recliner. His lunch was set up for him to eat.   Therapy Documentation Precautions:  Precautions Precautions: Fall, Cervical Required Braces or Orthoses: Cervical Brace Cervical Brace: At all times, Hard collar Restrictions Weight Bearing Restrictions Per Provider Order: No  Therapy/Group: Individual Therapy  Nena VEAR Moats 12/02/2023, 6:22 AM

## 2023-12-02 NOTE — Procedures (Signed)
 Modified Barium Swallow Study  Patient Details  Name: Zachary Underwood MRN: [id number] Date of Birth: Aug 27, 1927  Today's Date: 12/02/2023  Modified Barium Swallow completed.  Full report located under Chart Review in the Imaging Section.  History of Present Illness Zachary Underwood is an active, still-working 89 y.o. male who presented to the ED for evaluation after he fell and hit his head and neck. CT head and maxillofacial negative for acute changes. CT cervical spine: mildly displaced fractures through the posterior arch of C1 and nondisplaced fracture through the midline anterior arch of C1. MR cervical spine: Displaced type 2 odontoid fracture with significant posterior dislocation of the dens relative to the body of C2, osteophyte at C5-6. MBS 11/25/23 -dysphagia 3, nectar liquids with precautions recommended. PMH: persistent atrial fibrillation on Xarelto , CVA, moderate aortic stenosis, s/p surgical repair of ascending thoracic aorta, HTN, HLD, BPH, chronic lower extremity edema.   Clinical Impression Pt presents w/ improved oropharyngeal dysphagia as compared to 8/4. However, mild dysphagia remains overall. Trace oral residue and some posterior lingual weakness remain. Pharyngeally, he demonstrates improved airway protection. Despite this; reduced tongue base retraction, hyolaryngeal excursion, and pharyngeal constriction continue to negatively impact swallow efficiency. Subsequently, mild scattered pharyngeal residue remains after the swallow. Liquid wash was effective to reduce phayrngeal residue after puree and solid trials. Penetration (PAS 2 w/ nectar and PAS 4 w/ thin) noted w/ thin and mildly thick consistencies, however this is well WFL for pt's age. The only instance of aspiration noted (PAS 7) occured during the pill trial. Similar to prev MBSS, the barium tablet became lodged in his valleculae. Cervical collar negatively impacted his ROM, however, a chin tuck, additoinal liquid washes,  and pureed consistencies were ineffective to clear the tablet. It was only cleared when pt eventually coughed up and expectorated said tablet. Of note, some potential prevertebral edema noted @ level of C2, however, difficult to differentiate d/t cervical lordosis. Hypertrophy of the cricopharyngeus noted as well. Anticipate recent cervical fx, potential edema, and deconditioning exacerbated baseline mild structural abnormalities and presbyphagia that patient was able to compensate for prior to this admission. Recommend continued Dys 3 textures and meds crushed in puree. Upgrade to thin liquids. Intermittent supervision during meals. Pt should also continue w/ previously recommended effortful swallow and double swallow to assist w/ pharyngeal clearance. Factors that may increase risk of adverse event in presence of aspiration Zachary Underwood & Zachary Underwood 2021): Frail or deconditioned  Swallow Evaluation Recommendations Recommendations: PO diet PO Diet Recommendation: Dysphagia 3 (Mechanical soft);Thin liquids (Level 0) Liquid Administration via: Straw;Cup Medication Administration: Crushed with puree Supervision: Patient able to self-feed;Intermittent supervision/cueing for swallowing strategies Swallowing strategies  : Slow rate;Small bites/sips;effortful swallow;Multiple dry swallows after each bite/sip;Follow solids with liquids;Clear throat intermittently Postural changes: Stay upright 30-60 min after meals;Position pt fully upright for meals Oral care recommendations: Oral care BID (2x/day) Caregiver Recommendations: Have oral suction available    Zachary Underwood Zachary Underwood 12/02/2023,12:09 PM

## 2023-12-02 NOTE — Progress Notes (Signed)
 Inpatient Rehabilitation Care Coordinator Assessment and Plan Patient Details  Name: Zachary Underwood MRN: [id number] Date of Birth: 1928-01-31  Today's Date: 12/02/2023  Hospital Problems: Principal Problem:   Cervical spine fracture [facility]) Active Problems:   Left knee pain   Dysphagia   Anemia   Chronic diastolic congestive heart failure (HCC)  Past Medical History:  Past Medical History:  Diagnosis Date   Arthritis    Cardiac arrhythmia    Hypertension    Ruptured aortic aneurysm (HCC)    Stroke (HCC) 08/23/2021   Past Surgical History:  Past Surgical History:  Procedure Laterality Date   open heart surgery     ruptured aortic aneurysm repair     Social History:  reports that he has quit smoking. His smoking use included cigarettes. He has never used smokeless tobacco. He reports that he does not drink alcohol and does not use drugs.  Family / Support Systems Marital Status: Married Patient Roles: Spouse Spouse/Significant Other: Zachary Underwood (wife) [phone] Children: Unknown Other Supports: None Anticipated Caregiver: Wife Ability/Limitations of Caregiver: Wife is able to provide supervision only as she can not due any physical lifting due to her own medical issues. Caregiver Availability: 24/7 Family Dynamics: Pt lives with his wife  Social History Preferred language: English Religion: Jewish Cultural Background: Pt is a professor and gives Psychologist, clinical - How often do you need to have someone help you when you read instructions, pamphlets, or other written material from your doctor or pharmacy?: Never Writes: Yes Employment Status: Retired Return to Work Plans: TBD.Pt wife reports pt continues to give lectures Legal History/Current Legal Issues: Unknown Guardian/Conservator: Unknown   Abuse/Neglect Abuse/Neglect Assessment Can Be Completed: Unable to assess, patient is non-responsive or altered mental status Physical Abuse: Denies Verbal Abuse:  Denies Sexual Abuse: Denies Exploitation of patient/patient's resources: Denies Self-Neglect: Denies  Patient response to: Social Isolation - How often do you feel lonely or isolated from those around you?: Patient unable to respond  Emotional Status Pt's affect, behavior and adjustment status: SW made efforts to complete assessment but pt was falling asleep and complaining of a headache Recent Psychosocial Issues: Unknown Psychiatric History: Unknown Substance Abuse History: Unknown  Patient / Family Perceptions, Expectations & Goals Pt/Family understanding of illness & functional limitations: Pt wife has a general understanding of care needs Premorbid pt/family roles/activities: Independent Anticipated changes in roles/activities/participation: Assistance with ADLs/IADLs Pt/family expectations/goals: Pt wife would like for her husband to be as independent as possible; she also stresses the importance of speech therapy.  Community [employer] Agencies: None Premorbid Home Care/DME Agencies: Other (Comment) (12hrs per week offered through [place]) Transportation available at discharge: TBD Is the patient able to respond to transportation needs?: Yes In the past 12 months, has lack of transportation kept you from medical appointments or from getting medications?: No In the past 12 months, has lack of transportation kept you from meetings, work, or from getting things needed for daily living?: No Resource referrals recommended: Neuropsychology  Discharge Planning Living Arrangements: Spouse/significant other Support Systems: Spouse/significant other Type of Residence: Private residence Insurance Resources: Electrical engineer Resources: Employment Financial Screen Referred: No Living Expenses: Banker Management: Patient, Spouse Does the patient have any problems obtaining your medications?: No Home Management: Both manage home care needs Patient/Family Preliminary  Plans: TBD Care Coordinator Barriers to Discharge: Decreased caregiver support, Lack of/limited family support Care Coordinator Anticipated Follow Up Needs: HH/OP Expected length of stay: 7-10 days  Clinical Impression Chart audit and collateral  reports used to complete assessment. Will confirm DME.   Zachary Underwood 12/02/2023, 1:11 PM

## 2023-12-02 NOTE — Progress Notes (Signed)
 Speech Language Pathology Daily Session Note  Patient Details  Name: Zachary Underwood MRN: [id number] Date of Birth: March 28, 1928  Today's Date: 12/02/2023 SLP Individual Time: 8883-8841 SLP Individual Time Calculation (min): 42 min  Short Term Goals: Week 1: SLP Short Term Goal 1 (Week 1): STGs = LTGs d/t ELOS  Skilled Therapeutic Interventions:  Patient was seen in am to address dysphagia management and dysarthria. Pt was alert upon SLP arrival though expressing discomfort and requesting repositioning. SLP assisted pt in repositioning in recliner and placement of ice pack behind the head with pt able to direct this SLP in order to maximize comfort. Pt noted with increasing lethargy as session progressed requiring re- alerting cues. He confirmed completion of MBSS earlier in the day however unable to expound upon results likely due to ongoing lethargy. SLP verbalizing plan of session; initiation of IOPI for lingual strengthening and Masako to increase airway protection. Pt asking multiple times if it was possible to reschedule session due to fatigue and then reporting, I can't concentrate right now. Throughout session, pt's speech intelligibility judged to be ~80% at the sentence level. He briefly expressed concerns about his speech though again demonstrated inability to expound upon information before falling asleep. Assigned NT and RN in and out during session, SLP providing update on results of MBSS and education on recommended swallow precautions. Signs in room were updated. Pt left upright in recliner with call button within reach and chair alarm active. SLP to continue POC.   Pain Pain Assessment Pain Scale: 0-10 Pain Score: 6  Pain Type: Acute pain Pain Location: Head Pain Orientation: Posterior Pain Descriptors / Indicators: Aching Pain Intervention(s): Repositioned  Therapy/Group: Individual Therapy  Joane GORMAN Fuss 12/02/2023, 12:19 PM

## 2023-12-02 NOTE — Care Management (Signed)
 [facility] Individual Statement of Services  Patient Name:  Zachary Underwood  Date:  12/02/2023  Welcome to the [facility].  Our goal is to provide you with an individualized program based on your diagnosis and situation, designed to meet your specific needs.  With this comprehensive rehabilitation program, you will be expected to participate in at least 3 hours of rehabilitation therapies Monday-Friday, with modified therapy programming on the weekends.  Your rehabilitation program will include the following services:  Physical Therapy (PT), Occupational Therapy (OT), Speech Therapy (ST), 24 hour per day rehabilitation nursing, Therapeutic Recreaction (TR), Psychology, Neuropsychology, Care Coordinator, Rehabilitation Medicine, Nutrition Services, Pharmacy Services, and Other  Weekly team conferences will be held on Tuesday to discuss your progress.  Your Inpatient Rehabilitation Care Coordinator will talk with you frequently to get your input and to update you on team discussions.  Team conferences with you and your family in attendance may also be held.  Expected length of stay: 7-10 days    Overall anticipated outcome: Supervision  Depending on your progress and recovery, your program may change. Your Inpatient Rehabilitation Care Coordinator will coordinate services and will keep you informed of any changes. Your Inpatient Rehabilitation Care Coordinator's name and contact numbers are listed  below.  The following services may also be recommended but are not provided by the Inpatient [facility]:  Driving Evaluations Home Health Rehabiltiation Services Outpatient Rehabilitation Services Vocational Rehabilitation   Arrangements will be made to provide these services after discharge if needed.  Arrangements include referral to agencies that provide these services.  Your insurance has been verified to be:  Medicare A/B  Your primary  doctor is:  [facility]  Pertinent information will be shared with your doctor and your insurance company.  Inpatient Rehabilitation Care Coordinator:  Zachary Underwood [phone] or [phone]  Information discussed with and copy given to patient by: Zachary Underwood, 12/02/2023, 10:40 AM

## 2023-12-02 NOTE — Progress Notes (Addendum)
 Patient ID: BRYOR RAMI, male   DOB: [birth year], 89 y.o.   MRN: [id number]  [facility] to inquire about [facility] SW, SW waiting on follow-up.   1041- SW spoke wiith pt wife to inform on ELOS. Wife reports various concerns that she has shared with nursing staff. She would like for him to be here for 3 weeks. She understands this may not occur and SW will relay her concerns. She would like an ENT and audiology consult. Feels audiology consult will help determine that the reason he has issues with his balance is due to his hearing. She understands these are done on an outpatient basis. SW informed will follow-up with updates after team conference.   [address] [facility] [place] [phone] ext [phone]; PCP- Dr. Salman; 30% service connected .  Graeme Jude, MSW, LCSW Office: [phone] Cell: [phone] Fax: [phone]

## 2023-12-02 NOTE — Progress Notes (Signed)
 PROGRESS NOTE   Subjective/Complaints:  No events overnight.   C/o L sided headache this morning over the frontal/temporal area, above the eye.  He describes it as intermittent, worse this morning than it has been prior, somewhat responsive to as needed medication which he has not yet received this morning.  He denies any vision changes, nausea, vertigo, or radiation of the headache.  It is not improved with adjustment of his cervical collar.  Mild HTN,  notable hypotension yesterday evening. HR high 50s, asymptomatic. Other vitals stable.     12/02/2023    5:47 AM 12/02/2023    5:00 AM 12/01/2023    8:00 PM  Vitals with BMI  Weight  176 lbs 2 oz   BMI  26.79   Systolic 139  155  Diastolic 54  69  Pulse 59  58    P.o. intakes appropriate  Continent of bladder  Last BM 8/9, small  ROS: Denies fevers, chills, N/V, abdominal pain, constipation, diarrhea,cough, chest pain, new weakness or paraesthesias.   + Dysphagia--ongoing + Hard of hearing--stable + Intermittent shortness of breath and anxiety- improved + Head and neck soreness, right shoulder  soreness + Left sided headache  Objective:   DG Chest 2 View Result Date: 11/30/2023 CLINICAL DATA:  Shortness of breath. EXAM: CHEST - 2 VIEW COMPARISON:  Radiograph 11/25/2023, CT 11/26/2023 FINDINGS: Prior median sternotomy. Cardiomegaly is stable. Mediastinal contours are unchanged. Small bilateral pleural effusions again seen. Improvement in pulmonary edema. No confluent opacity or pneumothorax. IMPRESSION: 1. Improvement in pulmonary edema. 2. Small bilateral pleural effusions. 3. Stable cardiomegaly. Electronically Signed   By: Andrea Gasman M.D.   On: 11/30/2023 11:16   Recent Labs    12/02/23 0510  WBC 8.7  HGB 11.4*  HCT 36.1*  PLT 191   Recent Labs    11/30/23 0516 12/02/23 0510  NA 141 142  K 3.8 4.1  CL 110 110  CO2 21* 23  GLUCOSE 104* 90  BUN 41* 34*   CREATININE 1.51* 1.20  CALCIUM  9.3 9.5    Intake/Output Summary (Last 24 hours) at 12/02/2023 0827 Last data filed at 12/01/2023 1425 Gross per 24 hour  Intake 236 ml  Output --  Net 236 ml        Physical Exam: Vital Signs Blood pressure (!) 139/54, pulse (!) 59, temperature (!) 97.4 F (36.3 C), resp. rate 17, height 5' 8 (1.727 m), weight 79.9 kg, SpO2 96%.    General: No apparent distress.  Laying in bed. HEENT: EOMI.  PERRLA.  No nystagmus.  Head is normocephalic, hearing aids in place. + Hard cervical collar--well-fitting Neck: Supple without JVD or lymphadenopathy.  Heart: Bradycardic rate, regular rhythm.  No obvious murmurs, rubs, gallops. Chest: CTA bilaterally without wheezes, rales, or rhonchi; no distress.  Suction at bedside, with clear output in canister.  Abdomen: Soft, non-tender, non-distended, bowel sounds positive. Extremities: No clubbing, cyanosis. Pulses are 2+.  Right upper extremity edema trace, improved from last week Psych: Pt's affect is appropriate. Pt is cooperative.  Skin: Clean and intact without signs of breakdown  + L shoulder- Chronic Lipoma  + Right anterior scalp laceration --stable  Neuro:  Mental Status: Awake, alert, and oriented to self, place, and time. No obvious cognitive deficits.  Limited interview due to headache. Speech/Languate: Intact, mild dysarthria.  100% intelligible. CRANIAL NERVES: 2 through 12 grossly intact   MOTOR:  RUE: 3/5 Deltoid, 4/5 Biceps, 4/5 Triceps,4/5 Grip LUE: 3/5 Deltoid, 4/5 Biceps, 4/5 Triceps, 4/5 Grip RLE: HF 4/5, KE 4/5, ADF 4/5, APF 4/5 LLE: HF 4/5, KE 4/5, ADF 4/5, APF 4/5     SENSORY: Normal to touch all 4 extremities   Coordination: No ataxia or dsymetria noted in bilateral upper or lower extremities     MSK: Right shoulder abduction limited to less than 90 degrees; crepitus on passive range of motion.  Assessment/Plan: 1. Functional deficits which require 3+ hours per day of  interdisciplinary therapy in a comprehensive inpatient rehab setting. Physiatrist is providing close team supervision and 24 hour management of active medical problems listed below. Physiatrist and rehab team continue to assess barriers to discharge/monitor patient progress toward functional and medical goals  Care Tool:  Bathing    Body parts bathed by patient: Right arm, Left arm, Abdomen, Chest, Front perineal area, Buttocks, Right upper leg, Left upper leg, Face   Body parts bathed by helper: Right lower leg, Left lower leg     Bathing assist Assist Level: Minimal Assistance - Patient > 75%     Upper Body Dressing/Undressing Upper body dressing   What is the patient wearing?: Hospital gown only    Upper body assist Assist Level: Minimal Assistance - Patient > 75%    Lower Body Dressing/Undressing Lower body dressing    Lower body dressing activity did not occur: Refused       Lower body assist       Toileting Toileting    Toileting assist Assist for toileting: Minimal Assistance - Patient > 75%     Transfers Chair/bed transfer  Transfers assist     Chair/bed transfer assist level: Minimal Assistance - Patient > 75%     Locomotion Ambulation   Ambulation assist      Assist level: Minimal Assistance - Patient > 75% Assistive device: Walker-rolling Max distance: 200'   Walk 10 feet activity   Assist     Assist level: Minimal Assistance - Patient > 75% Assistive device: Walker-rolling   Walk 50 feet activity   Assist    Assist level: Minimal Assistance - Patient > 75% Assistive device: Walker-rolling    Walk 150 feet activity   Assist    Assist level: Minimal Assistance - Patient > 75% Assistive device: Walker-rolling    Walk 10 feet on uneven surface  activity   Assist     Assist level: Minimal Assistance - Patient > 75% Assistive device: Development worker, international aid     Assist Is the patient using a wheelchair?:  Yes Type of Wheelchair: Manual Wheelchair activity did not occur: Safety/medical concerns    Max wheelchair distance: 150'    Wheelchair 50 feet with 2 turns activity    Assist        Assist Level: Dependent - Patient 0%   Wheelchair 150 feet activity     Assist      Assist Level: Dependent - Patient 0%   Blood pressure (!) 139/54, pulse (!) 59, temperature (!) 97.4 F (36.3 C), resp. rate 17, height 5' 8 (1.727 m), weight 79.9 kg, SpO2 96%.  Medical Problem List and Plan: 1. Functional deficits secondary to type II posterior displaced odontoid fracture after a fall             -  patient may shower; maintain cervical collar 24/7             -ELOS/Goals: 5-7 days, PT/OT/SLP Mod I              -Pts wife would like to be notified before any medication changes; per discussion will plan to call every Tues/Thurs and PRN for medical updates    - 8/7: Dr. Burnetta with ortho spine provided second opinion; recommendations  - Hard cervical collar at all times.  - reevaluate the fracture with x-rays in 4 weeks. - - Recommend he continue to use Celebrex  and muscle relaxer as this is controlling his pain.  - follow-up with me in 4 weeks.  - 8/7: Provided list of PRN medications to patient   2.  Antithrombotics: -DVT/anticoagulation:  Mechanical: Sequential compression devices, below knee Bilateral lower extremities Pharmaceutical: Xarelto              -antiplatelet therapy: N/A  3. Pain Management: Celebrex  (monitor Cr) Robaxin , oxycodone  as needed             - Left knee pain-received cortisone injection by orthopedics  - 8-8: Switch to tramadol  50 mg for moderate pain and oxycodone  2.5 mg for severe pain every 6 hours as needed.  Continue Robaxin , Celebrex .  Nursing to inquire about location of cold packs patient had on prior floor.  8/9: Cold therapy Prn for shoulders, head  8/10 discussed with patient's wife, patient does not ask for tramadol  as needed.  Will schedule twice  a day--tolerating  8-11: Left-sided headache.  Patient refusing ice packs.  Per report, tramadol  not effective; changed to as needed.  Patient has Celebrex , oxycodone  available.  Allergic to Tylenol, could consider Topamax but cautious to oversedate.  Continue current regimen for now.  4. Mood/Behavior/Sleep: LCSW to follow for evaluation and support when available.              -antipsychotic agents: N/A  - 8-8.  Benadryl  25 mg nightly as needed for sleep; per wife, patient uses this at home  5. Neuropsych/cognition: This patient is capable of making decisions on his own behalf.   6. Skin/Wound Care: routine pressure relief measures -[place] J collar in place, monitor for changes in character of skin  -scalp laceration present on admission --stable appearance  7. Fluids/Electrolytes/Nutrition: monitor I/O and repeat labs in a.m.              - Dysphagia 3 with nectar thick liquids--continue aspiration precautions. Continue SLP   - 8/8: Eating 50 to 100% of meals; on  Free water protocol for hydration.  Albumin 2.9, will encourage p.o. intakes, appreciate nutrition assistance.  8-11: Labs stable.  Albumin improved from 2.9-3.2.  Cleared for thin fluids today.  8. HTN: resumed home irbesartan --no BB due to bradycardia  - 8-8: Add as needed hydralazine  10 milligrams every 6 hours for SBP greater than 160, DBP greater than 100.  Increased blood pressure likely due to pain, monitor today may increase irbesartan  if renal function improves tomorrow.  -8/10 BP a little labile, continue to monitor trend and continue current medications--ongoing     12/02/2023    5:47 AM 12/02/2023    5:00 AM 12/01/2023    8:00 PM  Vitals with BMI  Weight  176 lbs 2 oz   BMI  26.79   Systolic 139  155  Diastolic 54  69  Pulse 59  58    9.  Chronic diastolic CHF/chronic lower extremity edema: patient's  wife requested metolazone  and not Demadex .              -Currently on metolazone  2.5 mg daily, BNP 261-- wife  says this CAN NOT BE REMOVED   - Daily weights and volume assessments.  - 8-11: Volume assessments and symptoms without signs of volume overload; chest x-ray 8-9 with improved pulmonary edema.  Weights downtrending while albumin is improving.  Monitor.  Filed Weights   11/28/23 1659 12/02/23 0500  Weight: 84 kg 79.9 kg    10. HLD: Continue home atorvastatin  11.  Mild anemia: Continue to monitor CBC recheck labs in a.m.  - 8-11 hemoglobin uptrending 11.4  12.  AKI: Creatinine 1.38 on admission baseline approximately 1.0--trend creatinine. Last Cr 1.52. Recheck tomorrow.  - 8-8: Creatinine 1.5, BUN slightly increased.  Plan for IV fluids very gentle normal saline at 50 cc/h for 10 hours.  Repeat BMP in AM.  Did discuss with wife that if AKI does not improve, may need to adjust scheduled Celebrex  to as needed.  -8/9 Cr a little better at 1.51, change celebrex  to PRN  -8/11 - BUN improving, Cr normalized - continue current regimen, recheck Thursday  13. Obesity             Body mass index is 30.3 kg/m.  14. Bradycardia/atrial fibrillation             -Pt was evaluated by cardiology, no indication for temporary pacing   - Regular rate  -8/9 HR stable currently, nursing reports occasional bradycardic episodes at night, pt was evaluated by cardiology for this during acute admission  8-11: Reported episode of bradycardia 30s to 50s during therapies today; self resolved, patient asymptomatic.  If recurs again, will get EKG.  15. Hx of aortic aneurysm/dissection -f/u outpatient, BP control   -Blood pressure management as above, has as needed hydralazine   16. Aortic stenosis             -Repeat echo 6-12 months has been recommended  17. L knee pain             -L knee injection completed by ortho 8/1--no current complaints of pain  18.  Right shoulder pain--no complaints on exam 8-11  19.  Dysphagia/secretion management  - Mucinex  600 mg twice daily  - Robitussin DM as needed  - DC  Claritin  10 mg daily  -Was placed on Unasyn  --> Augmentin  for suspected aspiration, chest x-ray 8-9 improved without opacity; finish antibiotics at 10 days total  -8-8: Patient managing secretions well on current exam, no current rhonchi or wheezing, no concerns for aspiration.  Monitor oxygen and respiratory status closely.  Can add scopolamine patch if needed, but would hold off due to multiple anticholinergic medications unless absolutely needed.  -8/9  Restart claritin -has allergies hx, discussed increased use of suction  -8/10 nasal congestion no better--Flonase  BID started 8/8  8-11: MBS today with pill dysphagia, otherwise cleared for thin liquids.  Possible prevertebral edema noted at C2, discussed with Dr. Georgina with orthopedics, will start Decadron  4 mg twice daily x 5 days with possible taper to see if this improves dysphagia  20. Occasional SOB and Anxiety   -Suspect most likely anxiety but secretions could be contributing. Will check CXR-negative for acute changes, Consider prn anxiety medication    -start xanax  0.25 PRN, discussed with his wife  - 63/10 patient's wife would like to have option for 0.125 dose of Xanax  as needed.  Order adjusted   -Schedule for  neuropscyh- next available --discussed patient case with Dr. Corina 8-11   LOS: 4 days A FACE TO FACE EVALUATION WAS PERFORMED  Zachary Underwood 12/02/2023, 8:27 AM

## 2023-12-03 ENCOUNTER — Inpatient Hospital Stay (HOSPITAL_COMMUNITY)

## 2023-12-03 DIAGNOSIS — I4821 Permanent atrial fibrillation: Secondary | ICD-10-CM | POA: Diagnosis not present

## 2023-12-03 MED ORDER — ALPRAZOLAM 0.25 MG PO TABS
0.1250 mg | ORAL_TABLET | Freq: Two times a day (BID) | ORAL | Status: DC | PRN
  Administered 2023-12-06 (×2): 0.125 mg via ORAL
  Filled 2023-12-03 (×2): qty 1

## 2023-12-03 MED ORDER — CELECOXIB 100 MG PO CAPS
200.0000 mg | ORAL_CAPSULE | Freq: Two times a day (BID) | ORAL | Status: DC
  Administered 2023-12-03 – 2023-12-08 (×14): 200 mg via ORAL
  Filled 2023-12-03 (×11): qty 2

## 2023-12-03 MED ORDER — ATROPINE SULFATE 1 MG/10ML IJ SOSY: 0.5000 mg | PREFILLED_SYRINGE | INTRAMUSCULAR | Status: DC | PRN

## 2023-12-03 NOTE — Progress Notes (Signed)
 SLP Cancellation Note  Patient Details Name: Zachary Underwood MRN: [id number] DOB: May 07, 1927   Cancelled treatment: Attempted to see pt for scheduled session. Upon SLP arrival noted that pt, family members, and necessary staff engaged in a meeting. Thus, pt missed 45 minutes of speech therapy. SLP will attempt to make up minutes as patient and clinician schedule allows.                                                                                                 Joane GORMAN Fuss 12/03/2023, 2:38 PM

## 2023-12-03 NOTE — Progress Notes (Signed)
 Patient ID: Zachary Underwood, male   DOB: Jul 20, 1927, 89 y.o.   MRN: [id number]   SW met with pt and pt wife in room to provide updates from team conference, and d.c date 8/20. SW will order RW with [employer] using Medicare as she has a RW that was ordered last year through [place].  SW informed will confirm if medical team can support the need for hospital bed. Fam edu scheduled for Friday 1pm-4pm. She will see if the aide [name] is able to come in. Will ask Wadie to help assist with discharge to home. Prefers HH; will follow-up with [facility]. SW will provide updates as available.   SW sent HHPT/OT/SLP/aide referral to [facility] and waiting on follow-ujp.   SW ordered RW with [employer] via parachute.   Graeme Jude, MSW, LCSW Office: [phone] Cell: [phone] Fax: [phone]

## 2023-12-03 NOTE — Progress Notes (Addendum)
 Speech Language Pathology Daily Session Note  Patient Details  Name: Zachary Underwood MRN: [id number] Date of Birth: 08-11-1927  Today's Date: 12/03/2023 SLP Individual Time: 1110-1200 SLP Individual Time Calculation (min): 50 min Missed minutes: 10  Reason: pt fatigue  Short Term Goals: Week 1: SLP Short Term Goal 1 (Week 1): STGs = LTGs d/t ELOS  Skilled Therapeutic Interventions:   Pt, wife, and RN greeted at bedside. He was initially asleep upon SLP arrival and his wife reported a difficult morning for the pt. SLP provided education to the wife re repeat MBSS completed 8/11: improved airway protection, remaining pre vertebral swelling, baseline cervical lordosis/CP hypertrophy, and upgrade to thin liquids. Also discussed introduction of steroids and anticipated positive impact on articulatory precision and resonance. She provided SLP w/ speech sample of baseline speech production. Recent changes in resonance/articulatory precision were evident. Pt awoke during education and was agreeable to tx tasks targeting speech production and dysphagia. SLP continued w/ introduction of IOPI, as attempted during prev tx session. His average strength in the anterior position was measured @ 19 kpa. He was then able to complete 15 reps in the anterior position @ 21 kpa w/ minA to ensure optimal technique. Pt suddenly presented w/ hyperventilation, but was able to return to normal breathing pattern within a minute. Pt reported no feeling of rapid HR or known panic before this began. Nurse notified and SLP provided pt w/ education re breathing strategies/exercises to assist w/ sympathetic nervous system regulation and reducing anxiety. He was able to demonstrate these independently. Also re introduced masako maneuver. He was able to complete 5 reps w/ minA for maximum potential. SLP also left instructions for him to complete outside of ST tx sessions. He was left semi reclined in bed w/ the alarm set and call light  within reach. His wife was present upon SLP departure as well. Recommend cont ST per POC.   Pain  No pain reported  Therapy/Group: Individual Therapy  [name] 12/03/2023, 1:50 PM

## 2023-12-03 NOTE — Patient Care Conference (Signed)
 Inpatient RehabilitationTeam Conference and Plan of Care Update Date: 12/03/2023   Time: 1008 am    Patient Name: Zachary Underwood      Medical Record Number: [id number]  Date of Birth: May 06, 1927 Sex: Male         Room/Bed: 4W08C/4W08C-01 Payor Info: Payor: MEDICARE / Plan: MEDICARE PART A AND B / Product Type: *No Product type* /    Admit Date/Time:  11/28/2023  4:11 PM  Primary Diagnosis:  Cervical spine fracture [facility])  Hospital Problems: Principal Problem:   Cervical spine fracture (HCC) Active Problems:   Left knee pain   Dysphagia   Anemia   Chronic diastolic congestive heart failure [facility])    Expected Discharge Date: Expected Discharge Date: 12/11/23  Team Members Present: Physician leading conference: Dr. Joesph Likes Social Worker Present: Graeme Jude, LCSW Nurse Present: Eulalio Falls, RN PT Present: Catilin Osborn, PT OT Present: Nena Moats, OT SLP Present: Recardo Mole, SLP     Current Status/Progress Goal Weekly Team Focus  Bowel/Bladder   Continent of bowel and bladder; LBM 12/02/23   Remain continent of bowel and bladder with minimal assistance.   Assess bowel and bladder needs q2 hour while awake.    Swallow/Nutrition/ Hydration   Dys 3 textures/thin liquids after MBSS 8/11; double swallow/effortful swallow; meds crushed in puree   modI  pt/family education, oropharyngeal strengthening exercises, IOPI    ADL's   CGA- (S) overall with toileting, ADL transfers, light min A with LB dressing, (S) UB ADLS. Barriers are pain   Mod I overall   ADL retraining, transfers, endurance, d/c planning    Mobility   pt transfers sup to sit w/ supervision,log roll, and sit to stand w/ CGA.  Amb w/ rollator 150' w/ CGA, verbal cues for walker management, cadence, foot clearance.   supervision w/ transfers and gait up to 150' w/ rollator.  increase endurance, gait w/ rollator.    Communication   mildly imprecise articulation and abnormal resonance   sup    pt/family education, IOPI, conversational tasks    Safety/Cognition/ Behavioral Observations  supervision - minA depending on his fatigue   modI to carryover education/exercises   pt/family education, visual aids    Pain   Patient denies pain at this time.   Patient rates pain < or equal to 3/10.   Assess pain q shift and PRN.    Skin   Laceration to top of head-scab in place.   Remain free from skin breakdown.  Assess skin q shift and PRN.      Discharge Planning:  D/c to home with his wife who can only provide supervison level of care. Pt will need to be as independent as possible. SW will confirm there are no barriers to discharge.    Team Discussion: Patient was admitted post fall with displaced odontoid fracture. Patient with pain/ labile blood pressure : medication adjusted by MD. Patient with symptomatic bradycardia, occasional shortness of breath. Patient limited by dysphagia, poor po intake, anxiety, vertigo and poor endurance.  Patient on target to meet rehab goals: Currently patient requires supervision assistance with upper body care and light min assist with lower body care. Patient requires CGA-supervision with transfers; Patient was able to ambulate up to 150' using a rolling walker with CGA. Patient needs supervision- min assist with cognition depending on patient's fatigue level. Overall goals at discharge are set for supervision to mod I assistance  *See Care Plan and progress notes for long and short-term goals.  Revisions to Treatment Plan:  Cervical collar at all times Audiology consult Cardiology consult MBSS Dysphagia 3 / thin liquids Pharyngeal exercises IOP Daily weights Caregiver education Family meeting  Teaching Needs: Safety, medications, toileting, transfers, dietary modifications, etc.   Current Barriers to Discharge: Decreased caregiver support, Home enviroment access/layout, Incontinence, and Weight  Possible Resolutions to  Barriers: Family Education      Medical Summary Current Status: medically complicated by hypertension, poor pain control, constipation, bradycardia/atria fibrillation, AKI, dysphagia, headache, intermittent bladder incontinence, SOB and anxiety  Barriers to Discharge: Behavior/Mood;Cardiac Complications;Electrolyte abnormality;Medical stability;Infection/IV Antibiotics;Pending surgery/plan;Renal Insufficiency/Failure;Self-care education;Uncontrolled Hypertension;Uncontrolled Pain;Weight bearing restrictions   Possible Resolutions to [employer]: titrate pain medications to minimum tolerated doses for function, titrate bowel medications, monitor vitals and increase BP regimen as approrpiate, cardiology input for symptomatic bradycardia, monitor labs and adjust for AKI, monitor anxiety on IV steroids for prevertebral swelling   Continued Need for Acute Rehabilitation Level of Care: The patient requires daily medical management by a physician with specialized training in physical medicine and rehabilitation for the following reasons: Direction of a multidisciplinary physical rehabilitation program to maximize functional independence : Yes Medical management of patient stability for increased activity during participation in an intensive rehabilitation regime.: Yes Analysis of laboratory values and/or radiology reports with any subsequent need for medication adjustment and/or medical intervention. : Yes   I attest that I was present, lead the team conference, and concur with the assessment and plan of the team.   Aneesah Hernan Gayo 12/03/2023, 1008 am

## 2023-12-03 NOTE — Progress Notes (Signed)
 New order for scheduled Celebrex  200mg  x2 daily. First dose given as PRN today at approx 0800, next dose will be admin at 2000 as scheduled

## 2023-12-03 NOTE — Progress Notes (Signed)
 NT informed this RN pt called nurse station to report shob episode. Patient recovered quickly c NT at bedside. Patient was lying in bed awake at approx 50-55 degrees. Vitals taken WDL c exception of HR which was 39 dinamap, retaken manually at left radial 46bpm

## 2023-12-03 NOTE — Progress Notes (Signed)
 Occupational Therapy Session Note  Patient Details  Name: Zachary Underwood MRN: [id number] Date of Birth: [birth year]  Today's Date: 12/03/2023 OT Individual Time: 0822-0903 OT Individual Time Calculation (min): 41 min    Short Term Goals: Week 1:  OT Short Term Goal 1 (Week 1): STG= LTG d/t ELOS  Skilled Therapeutic Interventions/Progress Updates:    Pt received supine with 3/10 pain in his shoulder, no request for intervention. He was more alert overall this session. He declined bathing or changing clothes despite offer of a shower. He came to EOB with (S) using bedrail. He stood with CGA from EOB. Using the rollator he completed functional mobility into the bathroom with CGA overall. He completed toileting tasks with (S) overall, voiding urine. He transferred to standing at the sink to wash hands and had sudden onset of SOB and dizziness episode followed by urinary incontinence. He began rushing to the bathroom and required cueing to slow down for reduced fall risk. He completed toileting tasks again to clean up. He donned new underwear and shorts with CGA sit <> stands. Cueing provided to dress LB in seated position. He attempted to begin another walk and had a similar episode. Checked vitals. BP 173/70, HR 55 bpm, SpO2 99%. Following a rest break he completed 50 ft of functional mobility at (S) level. He returned to supine in bed, RN now present with xanax . Pt endorses that episodes feel similar to an anxiety attack and endorses that this has happened in the past. Did notify DO of events.   Therapy Documentation Precautions:  Precautions Precautions: Fall, Cervical Required Braces or Orthoses: Cervical Brace Cervical Brace: At all times, Hard collar Restrictions Weight Bearing Restrictions Per Provider Order: No Therapy/Group: Individual Therapy  Nena VEAR Moats 12/03/2023, 8:50 AM

## 2023-12-03 NOTE — Progress Notes (Addendum)
 PROGRESS NOTE   Subjective/Complaints:  No events overnight.   Reported 2 episodes of SOB, anxiety, and HR 30-50s this AM with BP 170s SBP max; responsive to PRN xanax , CXR looks stable, EKG brady in the 40s with read saying a fib but appears sawtooth pattern ?flutter.   Incontinent of bladder this AM during episode.   Patient endorses SOB on exam but is laying in bed, breathing comfortably.    ROS: Denies fevers, chills, N/V, abdominal pain, constipation, diarrhea,cough, chest pain, new weakness or paraesthesias.   + Dysphagia--ongoing + Hard of hearing--stable + Intermittent shortness of breath and anxiety- ongoing, worse today + Left sided headache --ongoing  Objective:   DG CHEST PORT 1 VIEW Result Date: 12/03/2023 CLINICAL DATA:  Shortness of breath. EXAM: PORTABLE CHEST 1 VIEW COMPARISON:  Radiograph 11/30/2023, CT 11/26/2023 FINDINGS: Prior median sternotomy. Stable cardiomegaly. Unchanged mediastinal contours. Small pleural effusions persist. Stable pulmonary vasculature. No acute airspace disease or pneumothorax. IMPRESSION: Stable cardiomegaly. Small pleural effusions. Electronically Signed   By: Andrea Gasman M.D.   On: 12/03/2023 10:07   DG Swallowing Func-Speech Pathology Result Date: 12/02/2023 Table formatting from the original result was not included. Modified Barium Swallow Study Patient Details Name: Zachary Underwood MRN: [id number] Date of Birth: March 07, 1928 Today's Date: 12/02/2023 HPI/PMH: HPI: Zachary Underwood is an active, still-working 89 y.o. male who presented to the ED for evaluation after he fell and hit his head and neck. CT head and maxillofacial negative for acute changes. CT cervical spine: mildly displaced fractures through the posterior arch of C1 and nondisplaced fracture through the midline anterior arch of C1. MR cervical spine: Displaced type 2 odontoid fracture with significant posterior dislocation  of the dens relative to the body of C2, osteophyte at C5-6. MBS 11/25/23 -dysphagia 3, nectar liquids with precautions recommended. PMH: persistent atrial fibrillation on Xarelto , CVA, moderate aortic stenosis, s/p surgical repair of ascending thoracic aorta, HTN, HLD, BPH, chronic lower extremity edema. Clinical Impression: Clinical Impression: Pt presents w/ improved oropharyngeal dysphagia as compared to 8/4. However, mild dysphagia remains overall. Trace oral residue and some posterior lingual weakness remain. Pharyngeally, he demonstrates improved airway protection. Despite this; reduced tongue base retraction, hyolaryngeal excursion, and pharyngeal constriction continue to negatively impact swallow efficiency. Subsequently, mild scattered pharyngeal residue remains after the swallow. Liquid wash was effective to reduce phayrngeal residue after puree and solid trials. Penetration (PAS 2 w/ nectar and PAS 4 w/ thin) noted w/ thin and mildly thick consistencies, however this is well WFL for pt's age. The only instance of aspiration noted (PAS 7) occured during the pill trial. Similar to prev MBSS, the barium tablet became lodged in his valleculae. Cervical collar negatively impacted his ROM, however, a chin tuck, additoinal liquid washes, and pureed consistencies were ineffective to clear the tablet. It was only cleared when pt eventually coughed up and expectorated said tablet. Of note, some potential prevertebral edema noted @ level of C2, however, difficult to differentiate d/t cervical lordosis. Hypertrophy of the cricopharyngeus noted as well. Anticipate recent cervical fx, potential edema, and deconditioning exacerbated baseline mild structural abnormalities and presbyphagia that patient was able to compensate for prior to this admission.  Recommend continued Dys 3 textures and meds crushed in puree. Upgrade to thin liquids. Intermittent supervision during meals. Pt should also continue w/ previously  recommended effortful swallow and double swallow to assist w/ pharyngeal clearance. Factors that may increase risk of adverse event in presence of aspiration [name] 2021): Factors that may increase risk of adverse event in presence of aspiration [name] 2021): Frail or deconditioned Recommendations/Plan: Swallowing Evaluation Recommendations Swallowing Evaluation Recommendations Recommendations: PO diet PO Diet Recommendation: Dysphagia 3 (Mechanical soft); Thin liquids (Level 0) Liquid Administration via: Straw; Cup Medication Administration: Crushed with puree Supervision: Patient able to self-feed; Intermittent supervision/cueing for swallowing strategies Swallowing strategies  : Slow rate; Small bites/sips; effortful swallow; Multiple dry swallows after each bite/sip; Follow solids with liquids; Clear throat intermittently Postural changes: Stay upright 30-60 min after meals; Position pt fully upright for meals Oral care recommendations: Oral care BID (2x/day) Caregiver Recommendations: Have oral suction available Treatment Plan Treatment Plan Treatment recommendations: Therapy as outlined in treatment plan below Follow-up recommendations: Outpatient SLP Functional status assessment: Patient has had a recent decline in their functional status and demonstrates the ability to make significant improvements in function in a reasonable and predictable amount of time. Treatment frequency: Min 5x/week Treatment duration: 2 weeks Interventions: Oropharyngeal exercises; Patient/family education; Diet toleration management by SLP; Compensatory techniques; Aspiration precaution training; Respiratory muscle strength training Recommendations Recommendations for follow up therapy are one component of a multi-disciplinary discharge planning process, led by the attending physician.  Recommendations may be updated based on patient status, additional functional criteria and insurance authorization.  Assessment: Orofacial Exam: Orofacial Exam Oral Cavity: Oral Hygiene: WFL Oral Cavity - Dentition: Adequate natural dentition Orofacial Anatomy: WFL Oral Motor/Sensory Function: WFL Anatomy: Anatomy: Prominent cricopharyngeus; Other (Comment); Suspected cervical osteophytes (cervical lordosis) Boluses Administered: Boluses Administered Boluses Administered: Thin liquids (Level 0); Mildly thick liquids (Level 2, nectar thick); Puree; Solid  Oral Impairment Domain: Oral Impairment Domain Lip Closure: No labial escape Tongue control during bolus hold: Posterior escape of less than half of bolus Bolus preparation/mastication: Timely and efficient chewing and mashing Bolus transport/lingual motion: Repetitive/disorganized tongue motion Oral residue: Trace residue lining oral structures Location of oral residue : Tongue Initiation of pharyngeal swallow : Posterior laryngeal surface of the epiglottis; Pyriform sinuses  Pharyngeal Impairment Domain: Pharyngeal Impairment Domain Soft palate elevation: No bolus between soft palate (SP)/pharyngeal wall (PW) Laryngeal elevation: Partial superior movement of thyroid cartilage/partial approximation of arytenoids to epiglottic petiole Anterior hyoid excursion: Partial anterior movement Epiglottic movement: Partial inversion Laryngeal vestibule closure: Incomplete, narrow column air/contrast in laryngeal vestibule Pharyngeal stripping wave : Present - diminished Pharyngeal contraction (A/P view only): N/A Pharyngoesophageal segment opening: Partial distention/partial duration, partial obstruction of flow Tongue base retraction: Narrow column of contrast or air between tongue base and PPW Pharyngeal residue: Collection of residue within or on pharyngeal structures Location of pharyngeal residue: Valleculae; Pharyngeal wall; Tongue base; Pyriform sinuses; Diffuse (>3 areas)  Esophageal Impairment Domain: No data recorded Pill: Pill Consistency administered: Thin liquids (Level 0);  Puree Thin liquids (Level 0): Impaired (see clinical impressions) Puree: Impaired (see clinical impressions) Penetration/Aspiration Scale Score: Penetration/Aspiration Scale Score 1.  Material does not enter airway: Puree; Solid 2.  Material enters airway, remains ABOVE vocal cords then ejected out: Mildly thick liquids (Level 2, nectar thick) 4.  Material enters airway, CONTACTS cords then ejected out: Thin liquids (Level 0) 7.  Material enters airway, passes BELOW cords and not ejected out despite cough attempt by patient:  Pill; Thin liquids (Level 0) (thin liquids during pill trial only) Compensatory Strategies: Compensatory Strategies Compensatory strategies: Yes Straw: Effective Effective Straw: Thin liquid (Level 0) Multiple swallows: Effective Effective Multiple Swallows: Thin liquid (Level 0); Mildly thick liquid (Level 2, nectar thick); Puree; Solid Chin tuck: Ineffective Ineffective Chin Tuck: Pill Liquid wash: Effective Effective Liquid Wash: Puree; Solid   General Information: Caregiver present: No  Diet Prior to this Study: Dysphagia 3 (mechanical soft); Mildly thick liquids (Level 2, nectar thick)   Temperature : Normal   Respiratory Status: WFL   Supplemental O2: None (Room air)   History of Recent Intubation: No  Behavior/Cognition: Alert; Cooperative Self-Feeding Abilities: Able to self-feed Baseline vocal quality/speech: Abnormal resonance Volitional Cough: Able to elicit Volitional Swallow: Able to elicit Exam Limitations: No limitations Goal Planning: Prognosis for improved oropharyngeal function: Good Barriers to Reach Goals: Severity of deficits; Other (Comment) Barriers/Prognosis Comment: deconditioned state Patient/Family Stated Goal: improve dysphagia Consulted and agree with results and recommendations: Patient Pain: Pain Assessment Pain Assessment: Faces Faces Pain Scale: 6 Pain Location: headache - L side Pain Intervention(s): Monitored during session; Premedicated before session End of  Session: Start Time:No data recorded Stop Time: No data recorded Time Calculation:No data recorded Charges: No data recorded SLP visit diagnosis: SLP Visit Diagnosis: Dysphagia, oropharyngeal phase (R13.12) Past Medical History: Past Medical History: Diagnosis Date  Arthritis   Cardiac arrhythmia   Hypertension   Ruptured aortic aneurysm (HCC)   Stroke (HCC) 08/23/2021 Past Surgical History: Past Surgical History: Procedure Laterality Date  open heart surgery    ruptured aortic aneurysm repair   [facility] 12/02/2023, 12:10 PM  Recent Labs    12/02/23 0510  WBC 8.7  HGB 11.4*  HCT 36.1*  PLT 191   Recent Labs    12/02/23 0510  NA 142  K 4.1  CL 110  CO2 23  GLUCOSE 90  BUN 34*  CREATININE 1.20  CALCIUM  9.5    Intake/Output Summary (Last 24 hours) at 12/03/2023 1010 Last data filed at 12/03/2023 1006 Gross per 24 hour  Intake 240 ml  Output 100 ml  Net 140 ml        Physical Exam: Vital Signs Blood pressure (!) 143/53, pulse (!) 53, temperature 97.8 F (36.6 C), temperature source Oral, resp. rate 18, height 5' 8 (1.727 m), weight 81.6 kg, SpO2 93%.    General: No apparent distress.  Laying in bed; difficult to arouse but once awake pleasant and appropriate.  HEENT: EOMI.  PERRLA.  No nystagmus.  Head is normocephalic, hearing aids in place. + Hard cervical collar--well-fitting Neck: Supple without JVD or lymphadenopathy.  Heart: Bradycardic rate 40s, irregular rhythm.  + systolic ejection murmur Chest: CTA bilaterally without wheezes, rales, or rhonchi; no distress.     Abdomen: Soft, non-tender, non-distended, bowel sounds positive. Extremities: No clubbing, cyanosis. Pulses are 2+.  Right upper extremity edema trace  Psych: Pt's affect is appropriate. Pt is cooperative.  Skin: Clean and intact without signs of breakdown  + L shoulder - Chronic Lipoma--stable  + Right anterior scalp laceration --stable--under mepilex dressing, c/d  Neuro:   Mental Status:  Lethargic but arrousable; oriented to self, place, and time. No obvious cognitive deficits.   Speech/Languate: Intact, mild dysarthria.  100% intelligible. CRANIAL NERVES: 2 through 12 grossly intact   MOTOR: unchanged RUE: 3/5 Deltoid, 4/5 Biceps, 4/5 Triceps,4/5 Grip LUE: 3/5 Deltoid, 4/5 Biceps, 4/5 Triceps, 4/5 Grip RLE: HF 4/5, KE 4/5, ADF 4/5, APF 4/5 LLE:  HF 4/5, KE 4/5, ADF 4/5, APF 4/5     SENSORY: Normal to touch all 4 extremities. No sensitivity or sensory loss over L V1-2   Coordination: No ataxia, no tremor   MSK: Right shoulder abduction limited to less than 90 degrees; crepitus on passive range of motion--unchanged  Assessment/Plan: 1. Functional deficits which require 3+ hours per day of interdisciplinary therapy in a comprehensive inpatient rehab setting. Physiatrist is providing close team supervision and 24 hour management of active medical problems listed below. Physiatrist and rehab team continue to assess barriers to discharge/monitor patient progress toward functional and medical goals  Care Tool:  Bathing    Body parts bathed by patient: Right arm, Left arm, Abdomen, Chest, Front perineal area, Buttocks, Right upper leg, Left upper leg, Face   Body parts bathed by helper: Right lower leg, Left lower leg     Bathing assist Assist Level: Minimal Assistance - Patient > 75%     Upper Body Dressing/Undressing Upper body dressing   What is the patient wearing?: Hospital gown only    Upper body assist Assist Level: Minimal Assistance - Patient > 75%    Lower Body Dressing/Undressing Lower body dressing    Lower body dressing activity did not occur: Refused       Lower body assist       Toileting Toileting    Toileting assist Assist for toileting: Minimal Assistance - Patient > 75%     Transfers Chair/bed transfer  Transfers assist     Chair/bed transfer assist level: Minimal Assistance - Patient > 75%      Locomotion Ambulation   Ambulation assist      Assist level: Contact Guard/Touching assist Assistive device: Rollator Max distance: 150   Walk 10 feet activity   Assist     Assist level: Contact Guard/Touching assist Assistive device: Rollator   Walk 50 feet activity   Assist    Assist level: Contact Guard/Touching assist Assistive device: Rollator    Walk 150 feet activity   Assist    Assist level: Contact Guard/Touching assist Assistive device: Rollator    Walk 10 feet on uneven surface  activity   Assist     Assist level: Minimal Assistance - Patient > 75% Assistive device: Walker-rolling   Wheelchair     Assist Is the patient using a wheelchair?: Yes Type of Wheelchair: Manual      Max wheelchair distance: 150'    Wheelchair 50 feet with 2 turns activity    Assist        Assist Level: Dependent - Patient 0%   Wheelchair 150 feet activity     Assist      Assist Level: Dependent - Patient 0%   Blood pressure (!) 143/53, pulse (!) 53, temperature 97.8 F (36.6 C), temperature source Oral, resp. rate 18, height 5' 8 (1.727 m), weight 81.6 kg, SpO2 93%.  Medical Problem List and Plan: 1. Functional deficits secondary to type II posterior displaced odontoid fracture after a fall             -patient may shower; maintain cervical collar 24/7             -ELOS/Goals: 5-7 days, PT/OT/SLP Mod I -- 8/20 DC date             -Pts wife would like to be notified before any medication changes; per discussion will plan to call every Tues/Thurs and PRN for medical updates    - 8/7: Dr. Burnetta  with ortho spine provided second opinion; recommendations  - Hard cervical collar at all times.  - reevaluate the fracture with x-rays in 4 weeks. - - Recommend he continue to use Celebrex  and muscle relaxer as this is controlling his pain.  - follow-up with me in 4 weeks.  - 8/7: Provided list of PRN medications to patient  - 8/12: Reported  caregiver at home was bathing and dressing him. Currently full SPV most ADLs, limited by SOB/symptomatic bradycardia episodes when mobilized--goals Mod I. SPV bed mobility/transfers, CGA with rollator 150 ft. Goals SPV. Limited by endurance. 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   - Family meeting with patient, wife, nursing admin, hospital admin, Architectural technologist, and medical team  today to discuss patient care -- provided appropriate boundaries with staff (ex. Not following nurses in hallway), reasonable expectations for medical updates, and patient advocacy - provided physician email and office # and discussed physician schedule, NP availability and limitations for medical updates/communication. Wife, team in agreement on minimizing stress with nursing surrounding attending to the patient and his needs. Emphasized OK to seek urgent care if emergency is suspected.    -  Will get nursing to provide call button due to difficulty finding call bell with C collar  2.  Antithrombotics: -DVT/anticoagulation:  Mechanical: Sequential compression devices, below knee Bilateral lower extremities Pharmaceutical: Xarelto              -antiplatelet therapy: N/A  3. Pain Management: Celebrex  (monitor Cr) Robaxin , oxycodone  as needed             - Left knee pain-received cortisone injection by orthopedics  - 8-8: Switch to tramadol  50 mg for moderate pain and oxycodone  2.5 mg for severe pain every 6 hours as needed.  Continue Robaxin , Celebrex .  Nursing to inquire about location of cold packs patient had on prior floor.  8/9: Cold therapy Prn for shoulders, head  8/10 discussed with patient's wife, patient does not ask for tramadol  as needed.  Will schedule twice a day--tolerating  8-11: Left-sided headache.  Patient refusing ice packs.  Per report, tramadol  not effective; changed to as needed.  Patient has Celebrex , oxycodone  available.  Allergic to Tylenol, could  consider Topamax but cautious to oversedate.  Continue current regimen for now.  8/12: D/w wife, requests resumption of scheduled celebrex  200 mg BID understanding may result in recurrent AKI. He has been tolerating once daily up to this point - will trial and check labs Thursday.  - DC tramadol .  - Discussed Fioricet, Journavix as alternative PRNS for headache-- family wishes to hold off at this time.   4. Mood/Behavior/Sleep: LCSW to follow for evaluation and support when available.              -antipsychotic agents: N/A  - 8-8.  Benadryl  25 mg nightly as needed for sleep; per wife, patient uses this at home--has not requested, sleeping well per nursing  - 8/12: 1x PRN 0.125 xanax  today with symptomatic improvement but significant lethargy; maintain minimal dosing only, remove 0.25 PRN option and reduce to BID PRN  5. Neuropsych/cognition: This patient is capable of making decisions on his own behalf.  6. Skin/Wound Care: routine pressure relief measures -[place] J collar in place, monitor for changes in character of skin  -scalp laceration present on admission --stable appearance  7. Fluids/Electrolytes/Nutrition: monitor I/O and repeat labs in a.m.              -  Dysphagia 3 with nectar thick liquids--continue aspiration precautions. Continue SLP   - 8/8: Eating 50 to 100% of meals; on  Free water protocol for hydration.  Albumin 2.9, will encourage p.o. intakes, appreciate nutrition assistance.  8-11: Labs stable.  Albumin improved from 2.9-3.2.  Cleared for thin fluids today.  8/12: patient refused all meals 8/11; 100% breakfast this AM. Will monitor today - if continues to be poor will get nutritional consult  8. HTN: resumed home irbesartan --no BB due to bradycardia  - 8-8: Add as needed hydralazine  10 milligrams every 6 hours for SBP greater than 160, DBP greater than 100.  Increased blood pressure likely due to pain, monitor today may increase irbesartan  if renal function improves  tomorrow.  - BP a little labile, continue to monitor trend and continue current medications  8/11: HTN with therapies, bradycardia - ?contribution of pain/headache vs. Anxiety. Self-resolves once calm.      12/03/2023    9:25 AM 12/03/2023    6:00 AM 12/03/2023    5:09 AM  Vitals with BMI  Weight  179 lbs 14 oz   BMI  27.36   Systolic 143  159  Diastolic 53  82  Pulse 53  60    9.  Chronic diastolic CHF/chronic lower extremity edema: patient's wife requested metolazone  and not Demadex .              -Currently on metolazone  2.5 mg daily, BNP 261-- wife says this CAN NOT BE REMOVED   - Daily weights and volume assessments.  - 8-11: Volume assessments and symptoms without signs of volume overload; chest x-ray 8-9 with improved pulmonary edema.  Weights downtrending while albumin is improving.  Monitor.  8/12: Stable cardiomegaly, pleural effusions on CXR--no external s/s volume overload; trend weights. May need to increase metolazone .   Filed Weights   11/28/23 1659 12/02/23 0500 12/03/23 0600  Weight: 84 kg 79.9 kg 81.6 kg    10. HLD: Continue home atorvastatin  11.  Mild anemia: Continue to monitor CBC recheck labs in a.m.  - 8-11 hemoglobin uptrending 11.4  12.  AKI: Creatinine 1.38 on admission baseline approximately 1.0--trend creatinine. Last Cr 1.52. Recheck tomorrow.  - 8-8: Creatinine 1.5, BUN slightly increased.  Plan for IV fluids very gentle normal saline at 50 cc/h for 10 hours.  Repeat BMP in AM.  Did discuss with wife that if AKI does not improve, may need to adjust scheduled Celebrex  to as needed.  -8/9 Cr a little better at 1.51, change celebrex  to PRN  -8/11 - BUN improving, Cr normalized - continue current regimen, recheck Thursday     13. Obesity             Body mass index is 30.3 kg/m.  14. Bradycardia/atrial fibrillation             -Pt was evaluated by cardiology, no indication for temporary pacing   - Regular rate  -8/9 HR stable currently, nursing  reports occasional bradycardic episodes at night, pt was evaluated by cardiology for this during acute admission  8-11: Reported episode of bradycardia 30s to 50s during therapies today; self resolved, patient asymptomatic.  If recurs again, will get EKG.  8/12: 2x episode SOB and bradycardia 30-50s with therapy this AM; EKG showed ?a flutter with ventricular rate 40s. Re-consulted cardiology for symptomatic bradycardia, added PRN atropine  0.5 mg IV (for use with rapid response if needed); not candidate for pacemake with last evaluation  15. Hx of aortic aneurysm/dissection -f/u  outpatient, BP control   -Blood pressure management as above, has as needed hydralazine   16. Aortic stenosis             -Repeat echo 6-12 months has been recommended  17. L knee pain             -L knee injection completed by ortho 8/1--no current complaints of pain  18.  Right shoulder pain--no complaints on exam 8-11  19.  Dysphagia/secretion management  - Mucinex  600 mg twice daily  - Robitussin DM as needed  - DC Claritin  10 mg daily  -Was placed on Unasyn  --> Augmentin  for suspected aspiration, chest x-ray 8-9 improved without opacity; finish antibiotics at 10 days total  -8-8: Patient managing secretions well on current exam, no current rhonchi or wheezing, no concerns for aspiration.  Monitor oxygen and respiratory status closely.  Can add scopolamine patch if needed, but would hold off due to multiple anticholinergic medications unless absolutely needed.  -8/9  Restart claritin -has allergies hx, discussed increased use of suction  -8/10 nasal congestion no better--Flonase  BID started 8/8  8-11: MBS today with pill dysphagia, otherwise cleared for thin liquids.  Possible prevertebral edema noted at C2, discussed with Dr. Georgina with orthopedics, will start Decadron  4 mg twice daily x 5 days with possible taper to see if this improves dysphagia  8/12: POs better today; monitor as above  20. Occasional SOB and  Anxiety, ?vertigo/fear of falling   -Suspect most likely anxiety but secretions could be contributing. Will check CXR-negative for acute changes, Consider prn anxiety medication    -start xanax  0.25 PRN, discussed with his wife  - 28/10 patient's wife would like to have option for 0.125 dose of Xanax  as needed.  Order adjusted   -Schedule for neuropscyh- next available --discussed patient case with Dr. Corina 8-11  - 8/12: Recurrent, associated with bradycardia and HTN; See #4, #14 above. Also with bed positional changes. May benefit from Buspar trial, family wishes to defer at this time. Offered atarax as alternative to xanax , family defers at this time. Discussed limitations of audiology consult for vertigo; offered COR testing however patient has already seen ENT for OP evaluation for this.    LOS: 5 days A FACE TO FACE EVALUATION WAS PERFORMED  Joesph JAYSON Likes 12/03/2023, 10:10 AM

## 2023-12-03 NOTE — Consult Note (Signed)
 ELECTROPHYSIOLOGY CONSULT NOTE    Patient ID: Zachary Underwood MRN: [id number], DOB/AGE: [birth year] 89 y.o.  Admit date: 11/28/2023 Date of Consult: 12/03/2023  Primary Physician: [facility], [name] Primary Cardiologist: Dorn Lesches, MD  Electrophysiologist: Dr. Inocencio, new consult 12/03/23   Referring Provider: Dr. Emeline  Patient Profile: Zachary Underwood is a 89 y.o. male with a history of permanent AF, falls vs syncope (pt remembered falls), HTN, HLD, aortic atherosclerosis, CVA, syncope who is being seen today for the evaluation of bradycardia at the request of Dr. Emeline.  HPI:  Zachary Underwood is a 89 y.o. male who was admitted to [facility] on 11/28/23 after a hospitalization at [facility] from 8/1-11/28/23 for closed displaced fracture of the cervical spine / C1 fracture. He slipped and fell prior to admit to [facility] and hit his head / neck.  He denies ever losing consciousness. 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    He has had known permanent AF.  He is followed by Dr. Lesches for Cardiology.  He was seen in the AF Clinic 03/2023 after a cardiac monitor showed 100% AF/AFL with controlled V-rates, occ PVC's & multiple pauses that were nocturnal and not patient triggered. He has not been on AV nodal blockers due to bradycardia.  He reports this has been a known issue for him for many years.    He currently reports anxiety and shortness of breath when his bed is laid flat. He remains in a [place] J cervical collar. He works with PT and per staff is able to increase his HR up above 100 bpm.  He has not had the episodes of anxiety and shortness of breath with PT efforts. Only when bed is laid flat.   He denies chest pain, palpitations, dyspnea, PND, orthopnea, nausea, vomiting, dizziness, syncope, edema, weight gain, or early satiety.   Labs Potassium4.1 (08/11 0510)   Creatinine, ser  1.20 (08/11 0510) PLT  191 (08/11 0510) HGB  11.4* (08/11  0510) WBC 8.7 (08/11 0510)  .    Past Medical History:  Diagnosis Date   Arthritis    Cardiac arrhythmia    Hypertension    Ruptured aortic aneurysm [facility])    Stroke (HCC) 08/23/2021     Surgical History:  Past Surgical History:  Procedure Laterality Date   open heart surgery     ruptured aortic aneurysm repair       Medications Prior to Admission  Medication Sig Dispense Refill Last Dose/Taking   amoxicillin -clavulanate (AUGMENTIN ) 875-125 MG tablet Take 1 tablet by mouth every 12 (twelve) hours.      atorvastatin  (LIPITOR) 10 MG tablet Take 5 mg by mouth at bedtime.      [Paused] candesartan (ATACAND) 4 MG tablet Take 0.5 tablets by mouth at bedtime.      celecoxib  (CELEBREX ) 200 MG capsule Take 1 capsule (200 mg total) by mouth 2 (two) times daily.      feeding supplement (ENSURE PLUS HIGH PROTEIN) LIQD Take 237 mLs by mouth 2 (two) times daily between meals.      ferrous sulfate 325 (65 FE) MG EC tablet Take 325 mg by mouth daily.      fluticasone  (FLONASE ) 50 MCG/ACT nasal spray Place 2 sprays into both nostrils daily.      [EXPIRED] lactulose  (CHRONULAC ) 10 GM/15ML solution Take 30 mLs (20 g total) by mouth once for 1 dose.      lidocaine  (LIDODERM ) 5 % Place 1  patch onto the skin daily. Remove & Discard patch within 12 hours or as directed by MD      loratadine  (CLARITIN ) 10 MG tablet Take 1 tablet (10 mg total) by mouth daily.      methocarbamol  (ROBAXIN ) 500 MG tablet Take 1 tablet (500 mg total) by mouth 3 (three) times daily.      metolazone  (ZAROXOLYN ) 2.5 MG tablet Take 1 tablet (2.5 mg total) by mouth daily. 90 tablet 3    Multiple Vitamins-Minerals (MULTIVITAMIN WITH MINERALS) tablet Take 0.5 tablets by mouth daily.      oxyCODONE  (OXY IR/ROXICODONE ) 5 MG immediate release tablet Take 0.5 tablets (2.5 mg total) by mouth every 3 (three) hours as needed for moderate pain (pain score 4-6) or breakthrough pain.      pantoprazole  (PROTONIX ) 40 MG tablet Take 1 tablet (40  mg total) by mouth 2 (two) times daily before a meal.      polyethylene glycol (MIRALAX  / GLYCOLAX ) 17 g packet Take 17 g by mouth daily.      Rivaroxaban  (XARELTO ) 15 MG TABS tablet Take 15 mg by mouth daily with supper.      senna-docusate (SENOKOT-S) 8.6-50 MG tablet Take 1 tablet by mouth 2 (two) times daily.      sodium chloride  (OCEAN) 0.65 % SOLN nasal spray Place 1 spray into both nostrils as needed for congestion.      tamsulosin  (FLOMAX ) 0.4 MG CAPS capsule Take 0.4 mg by mouth at bedtime.      torsemide  (DEMADEX ) 20 MG tablet Take 1 tablet (20 mg total) by mouth daily as needed.       Inpatient Medications:   amoxicillin -clavulanate  1 tablet Oral Q12H   atorvastatin   5 mg Oral QHS   celecoxib   200 mg Oral BID   dexamethasone  (DECADRON ) injection  4 mg Intravenous Q12H   feeding supplement  237 mL Oral BID BM   fluticasone   2 spray Each Nare Daily   guaiFENesin   600 mg Oral BID   irbesartan   37.5 mg Oral Daily   lidocaine   1 patch Transdermal Q24H   loratadine   10 mg Oral Daily   methocarbamol   500 mg Oral TID   metolazone   2.5 mg Oral Daily   pantoprazole   40 mg Oral BID AC   polyethylene glycol  17 g Oral Daily   Rivaroxaban   15 mg Oral Q supper   senna-docusate  1 tablet Oral BID   tamsulosin   0.4 mg Oral QHS    Allergies:  Allergies  Allergen Reactions   Short Ragweed Pollen Ext Other (See Comments)    Unknown    Beef-Derived Drug Products Other (See Comments)    Patient does not eat beef    Eliquis  [Apixaban ] Itching   Lasix [Furosemide] Itching   Other Other (See Comments) and Hypertension    Fusid- Legs became very swollen   Tylenol [Acetaminophen] Hives    History reviewed. No pertinent family history.   Physical Exam: Vitals:   12/03/23 0509 12/03/23 0600 12/03/23 0925 12/03/23 1056  BP: (!) 159/82  (!) 143/53 (!) 143/62  Pulse: 60  (!) 53 (!) 46  Resp: 18     Temp: 97.8 F (36.6 C)     TempSrc: Oral     SpO2: 99%  93% 99%  Weight:  81.6 kg     Height:        GEN- elderly male lying in bed in NAD, A&O x 3, normal affect HEENT: Normocephalic, atraumatic,  cervical collar in place Lungs- CTAB, Normal effort.  Heart- Irregularly irregular rate and rhythm, No M/G/R.  GI- Soft, NT, ND.  Extremities- No clubbing, cyanosis, or edema   Radiology/Studies: DG CHEST PORT 1 VIEW Result Date: 12/03/2023 CLINICAL DATA:  Shortness of breath. EXAM: PORTABLE CHEST 1 VIEW COMPARISON:  Radiograph 11/30/2023, CT 11/26/2023 FINDINGS: Prior median sternotomy. Stable cardiomegaly. Unchanged mediastinal contours. Small pleural effusions persist. Stable pulmonary vasculature. No acute airspace disease or pneumothorax. IMPRESSION: Stable cardiomegaly. Small pleural effusions. Electronically Signed   By: Andrea Gasman M.D.   On: 12/03/2023 10:07   DG Swallowing Func-Speech Pathology Result Date: 12/02/2023 Table formatting from the original result was not included. Modified Barium Swallow Study Patient Details Name: Zachary Underwood MRN: [id number] Date of Birth: 09/04/1927 Today's Date: 12/02/2023 HPI/PMH: HPI: Gen Clagg is an active, still-working 89 y.o. male who presented to the ED for evaluation after he fell and hit his head and neck. CT head and maxillofacial negative for acute changes. CT cervical spine: mildly displaced fractures through the posterior arch of C1 and nondisplaced fracture through the midline anterior arch of C1. MR cervical spine: Displaced type 2 odontoid fracture with significant posterior dislocation of the dens relative to the body of C2, osteophyte at C5-6. MBS 11/25/23 -dysphagia 3, nectar liquids with precautions recommended. PMH: persistent atrial fibrillation on Xarelto , CVA, moderate aortic stenosis, s/p surgical repair of ascending thoracic aorta, HTN, HLD, BPH, chronic lower extremity edema. Clinical Impression: Clinical Impression: Pt presents w/ improved oropharyngeal dysphagia as compared to 8/4. However, mild dysphagia remains  overall. Trace oral residue and some posterior lingual weakness remain. Pharyngeally, he demonstrates improved airway protection. Despite this; reduced tongue base retraction, hyolaryngeal excursion, and pharyngeal constriction continue to negatively impact swallow efficiency. Subsequently, mild scattered pharyngeal residue remains after the swallow. Liquid wash was effective to reduce phayrngeal residue after puree and solid trials. Penetration (PAS 2 w/ nectar and PAS 4 w/ thin) noted w/ thin and mildly thick consistencies, however this is well WFL for pt's age. The only instance of aspiration noted (PAS 7) occured during the pill trial. Similar to prev MBSS, the barium tablet became lodged in his valleculae. Cervical collar negatively impacted his ROM, however, a chin tuck, additoinal liquid washes, and pureed consistencies were ineffective to clear the tablet. It was only cleared when pt eventually coughed up and expectorated said tablet. Of note, some potential prevertebral edema noted @ level of C2, however, difficult to differentiate d/t cervical lordosis. Hypertrophy of the cricopharyngeus noted as well. Anticipate recent cervical fx, potential edema, and deconditioning exacerbated baseline mild structural abnormalities and presbyphagia that patient was able to compensate for prior to this admission. Recommend continued Dys 3 textures and meds crushed in puree. Upgrade to thin liquids. Intermittent supervision during meals. Pt should also continue w/ previously recommended effortful swallow and double swallow to assist w/ pharyngeal clearance. Factors that may increase risk of adverse event in presence of aspiration [name] 2021): Factors that may increase risk of adverse event in presence of aspiration [name] 2021): Frail or deconditioned Recommendations/Plan: Swallowing Evaluation Recommendations Swallowing Evaluation Recommendations Recommendations: PO diet PO Diet Recommendation:  Dysphagia 3 (Mechanical soft); Thin liquids (Level 0) Liquid Administration via: Straw; Cup Medication Administration: Crushed with puree Supervision: Patient able to self-feed; Intermittent supervision/cueing for swallowing strategies Swallowing strategies  : Slow rate; Small bites/sips; effortful swallow; Multiple dry swallows after each bite/sip; Follow solids with liquids; Clear throat intermittently Postural changes: Stay upright 30-60  min after meals; Position pt fully upright for meals Oral care recommendations: Oral care BID (2x/day) Caregiver Recommendations: Have oral suction available Treatment Plan Treatment Plan Treatment recommendations: Therapy as outlined in treatment plan below Follow-up recommendations: Outpatient SLP Functional status assessment: Patient has had a recent decline in their functional status and demonstrates the ability to make significant improvements in function in a reasonable and predictable amount of time. Treatment frequency: Min 5x/week Treatment duration: 2 weeks Interventions: Oropharyngeal exercises; Patient/family education; Diet toleration management by SLP; Compensatory techniques; Aspiration precaution training; Respiratory muscle strength training Recommendations Recommendations for follow up therapy are one component of a multi-disciplinary discharge planning process, led by the attending physician.  Recommendations may be updated based on patient status, additional functional criteria and insurance authorization. Assessment: Orofacial Exam: Orofacial Exam Oral Cavity: Oral Hygiene: WFL Oral Cavity - Dentition: Adequate natural dentition Orofacial Anatomy: WFL Oral Motor/Sensory Function: WFL Anatomy: Anatomy: Prominent cricopharyngeus; Other (Comment); Suspected cervical osteophytes (cervical lordosis) Boluses Administered: Boluses Administered Boluses Administered: Thin liquids (Level 0); Mildly thick liquids (Level 2, nectar thick); Puree; Solid  Oral Impairment  Domain: Oral Impairment Domain Lip Closure: No labial escape Tongue control during bolus hold: Posterior escape of less than half of bolus Bolus preparation/mastication: Timely and efficient chewing and mashing Bolus transport/lingual motion: Repetitive/disorganized tongue motion Oral residue: Trace residue lining oral structures Location of oral residue : Tongue Initiation of pharyngeal swallow : Posterior laryngeal surface of the epiglottis; Pyriform sinuses  Pharyngeal Impairment Domain: Pharyngeal Impairment Domain Soft palate elevation: No bolus between soft palate (SP)/pharyngeal wall (PW) Laryngeal elevation: Partial superior movement of thyroid cartilage/partial approximation of arytenoids to epiglottic petiole Anterior hyoid excursion: Partial anterior movement Epiglottic movement: Partial inversion Laryngeal vestibule closure: Incomplete, narrow column air/contrast in laryngeal vestibule Pharyngeal stripping wave : Present - diminished Pharyngeal contraction (A/P view only): N/A Pharyngoesophageal segment opening: Partial distention/partial duration, partial obstruction of flow Tongue base retraction: Narrow column of contrast or air between tongue base and PPW Pharyngeal residue: Collection of residue within or on pharyngeal structures Location of pharyngeal residue: Valleculae; Pharyngeal wall; Tongue base; Pyriform sinuses; Diffuse (>3 areas)  Esophageal Impairment Domain: No data recorded Pill: Pill Consistency administered: Thin liquids (Level 0); Puree Thin liquids (Level 0): Impaired (see clinical impressions) Puree: Impaired (see clinical impressions) Penetration/Aspiration Scale Score: Penetration/Aspiration Scale Score 1.  Material does not enter airway: Puree; Solid 2.  Material enters airway, remains ABOVE vocal cords then ejected out: Mildly thick liquids (Level 2, nectar thick) 4.  Material enters airway, CONTACTS cords then ejected out: Thin liquids (Level 0) 7.  Material enters airway,  passes BELOW cords and not ejected out despite cough attempt by patient: Pill; Thin liquids (Level 0) (thin liquids during pill trial only) Compensatory Strategies: Compensatory Strategies Compensatory strategies: Yes Straw: Effective Effective Straw: Thin liquid (Level 0) Multiple swallows: Effective Effective Multiple Swallows: Thin liquid (Level 0); Mildly thick liquid (Level 2, nectar thick); Puree; Solid Chin tuck: Ineffective Ineffective Chin Tuck: Pill Liquid wash: Effective Effective Liquid Wash: Puree; Solid   General Information: Caregiver present: No  Diet Prior to this Study: Dysphagia 3 (mechanical soft); Mildly thick liquids (Level 2, nectar thick)   Temperature : Normal   Respiratory Status: WFL   Supplemental O2: None (Room air)   History of Recent Intubation: No  Behavior/Cognition: Alert; Cooperative Self-Feeding Abilities: Able to self-feed Baseline vocal quality/speech: Abnormal resonance Volitional Cough: Able to elicit Volitional Swallow: Able to elicit Exam Limitations: No limitations Goal Planning: Prognosis for  improved oropharyngeal function: Good Barriers to Reach Goals: Severity of deficits; Other (Comment) Barriers/Prognosis Comment: deconditioned state Patient/Family Stated Goal: improve dysphagia Consulted and agree with results and recommendations: Patient Pain: Pain Assessment Pain Assessment: Faces Faces Pain Scale: 6 Pain Location: headache - L side Pain Intervention(s): Monitored during session; Premedicated before session End of Session: Start Time:No data recorded Stop Time: No data recorded Time Calculation:No data recorded Charges: No data recorded SLP visit diagnosis: SLP Visit Diagnosis: Dysphagia, oropharyngeal phase (R13.12) Past Medical History: Past Medical History: Diagnosis Date  Arthritis   Cardiac arrhythmia   Hypertension   Ruptured aortic aneurysm (HCC)   Stroke (HCC) 08/23/2021 Past Surgical History: Past Surgical History: Procedure Laterality Date  open heart  surgery    ruptured aortic aneurysm repair   [name] 12/02/2023, 12:10 PM  DG Chest 2 View Result Date: 11/30/2023 CLINICAL DATA:  Shortness of breath. EXAM: CHEST - 2 VIEW COMPARISON:  Radiograph 11/25/2023, CT 11/26/2023 FINDINGS: Prior median sternotomy. Cardiomegaly is stable. Mediastinal contours are unchanged. Small bilateral pleural effusions again seen. Improvement in pulmonary edema. No confluent opacity or pneumothorax. IMPRESSION: 1. Improvement in pulmonary edema. 2. Small bilateral pleural effusions. 3. Stable cardiomegaly. Electronically Signed   By: Andrea Gasman M.D.   On: 11/30/2023 11:16   DG Cervical Spine 2 or 3 views Result Date: 11/28/2023 CLINICAL DATA:  Fracture. EXAM: CERVICAL SPINE - 2-3 VIEW COMPARISON:  CT 11/22/2023 FINDINGS: Displaced dens fracture with 12 mm posterior displacement of the proximal fracture fragment. No significant change in alignment from prior exam. The C1 fractures on prior CT are not well demonstrated by radiograph. Similar anterolisthesis of C4 on C5 and C5 on C6. Prevertebral soft tissue thickening is seen at the fracture site. AP views are limited due to osseous overlap. IMPRESSION: 1. Displaced dens fracture with 12 mm posterior displacement of the proximal fracture fragment. No significant change in alignment from prior exam. 2. C1 fractures on prior CT are not well demonstrated by radiograph. Electronically Signed   By: Andrea Gasman M.D.   On: 11/28/2023 14:24   CT Angio Chest Pulmonary Embolism (PE) W or WO Contrast Addendum Date: 11/26/2023 ADDENDUM REPORT: 11/26/2023 19:18 ADDENDUM: The previously seen bilobed fatty lesion in the left pectoral area is similar to prior CT. Follow-up as per recommendation of prior CT. Electronically Signed   By: Vanetta Chou M.D.   On: 11/26/2023 19:18   Result Date: 11/26/2023 CLINICAL DATA:  Concern for pulmonary embolism. EXAM: CT ANGIOGRAPHY CHEST WITH CONTRAST TECHNIQUE: Multidetector CT imaging of  the chest was performed using the standard protocol during bolus administration of intravenous contrast. Multiplanar CT image reconstructions and MIPs were obtained to evaluate the vascular anatomy. RADIATION DOSE REDUCTION: This exam was performed according to the departmental dose-optimization program which includes automated exposure control, adjustment of the mA and/or kV according to patient size and/or use of iterative reconstruction technique. CONTRAST:  75mL OMNIPAQUE  IOHEXOL  350 MG/ML SOLN COMPARISON:  CT chest abdomen pelvis dated 11/22/2023. FINDINGS: Cardiovascular: There is cardiomegaly. There is retrograde flow of contrast from the right atrium into the IVC suggestive of right heart dysfunction. There is coronary vascular calcification. Advanced atherosclerotic calcification of the thoracic aorta. There is aneurysmal dilatation of the ascending aorta measuring up to approximately 5 cm in diameter and progressed since the prior CT. Evaluation of the aorta is limited due to suboptimal opacification and timing of the contrast. No pulmonary artery embolus identified. Mediastinum/Nodes: No hilar adenopathy. Evaluation however is limited  due to consolidative changes of the adjacent lungs. Mildly enlarged lymph node anterior to the carina measures approximately 1 cm in short axis. The esophagus is grossly unremarkable. No mediastinal fluid collection. Lungs/Pleura: Small bilateral pleural effusions with partial compressive atelectasis of the lower lobes versus pneumonia. There is diffuse interstitial and interlobular septal prominence suggestive of edema. No pneumothorax. The central airways are patent. Upper Abdomen: No acute abnormality. Musculoskeletal: Osteopenia with degenerative changes of spine. Median sternotomy wires. No acute osseous pathology. Review of the MIP images confirms the above findings. IMPRESSION: 1. No CT evidence of pulmonary artery embolus. 2. Cardiomegaly with findings of right heart  dysfunction. 3. Small bilateral pleural effusions with partial compressive atelectasis of the lower lobes versus pneumonia. 4. Aneurysmal dilatation of the ascending aorta measuring up to 5 cm and progressed since the prior CT. Ascending thoracic aortic aneurysm. Recommend semi-annual imaging followup by CTA or MRA and referral to cardiothoracic surgery if not already obtained. This recommendation follows 2010 ACCF/AHA/AATS/ACR/ASA/SCA/SCAI/SIR/STS/SVM Guidelines for the Diagnosis and Management of Patients With Thoracic Aortic Disease. Circulation. 2010; 121: Z733-z630. Aortic aneurysm NOS (ICD10-I71.9) 5.  Aortic Atherosclerosis (ICD10-I70.0). Electronically Signed: By: Vanetta Chou M.D. On: 11/26/2023 19:00   DG Knee Complete 4 Views Left Result Date: 11/26/2023 CLINICAL DATA:  Left knee pain. EXAM: LEFT KNEE - COMPLETE 4+ VIEW COMPARISON:  None Available. FINDINGS: Moderate medial tibiofemoral joint space narrowing. Mild tricompartmental peripheral spurring. The bones are subjectively under mineralized. No evidence of fracture, erosion or focal bone abnormality. No significant joint effusion. Peripheral vascular calcifications are seen. IMPRESSION: Mild tricompartmental osteoarthritis, most prominent in the medial tibiofemoral compartment. Electronically Signed   By: Andrea Gasman M.D.   On: 11/26/2023 13:23   MR BRAIN WO CONTRAST Result Date: 11/26/2023 EXAM: MRI BRAIN WITHOUT CONTRAST 11/26/2023 03:40:00 AM TECHNIQUE: Multiplanar multisequence MRI of the head/brain was performed without the administration of intravenous contrast. COMPARISON: 07/24/2023 brain MRI 11/22/2023 CT cervical spine CLINICAL HISTORY: Neuro deficit, acute, stroke suspected. FINDINGS: BRAIN AND VENTRICLES: Old left cerebellar infarct. No acute infarct. No intracranial hemorrhage. No mass. No midline shift. No hydrocephalus. The sella is unremarkable. Normal flow voids. Minimal nonspecific white matter T2-weighted signal  hyperintensities, which may be associated with early chronic small vessel disease or migraine headaches. Normal volume loss. ORBITS: Ocular lens replacements. No acute abnormality. SINUSES AND MASTOIDS: No acute abnormality. BONES AND SOFT TISSUES: Known C2 fracture with posterior displacement of the dens relative to the C2 body. No acute soft tissue abnormality. IMPRESSION: 1. No acute intracranial abnormality. 2. Old left cerebellar infarct. 3. Minimal nonspecific white matter T2-weighted signal hyperintensities, possibly related to chronic small vessel disease 4. Known C2 fracture Electronically signed by: Franky Stanford MD 11/26/2023 03:47 AM EDT RP Workstation: [id number]   ECHOCARDIOGRAM COMPLETE Result Date: 11/25/2023    ECHOCARDIOGRAM REPORT   Patient Name:   Zachary Underwood Date of Exam: 11/25/2023 Medical Rec #:  [id number]       Height:       68.0 in Accession #:    [id number]      Weight:       186.0 lb Date of Birth:  1928/01/12       BSA:          1.982 m Patient Age:    89 years        BP:           123/55 mmHg Patient Gender: M  HR:           56 bpm. Exam Location:  Inpatient Procedure: 2D Echo (Both Spectral and Color Flow Doppler were utilized during            procedure). Indications:    aortic stenosis  History:        Patient has prior history of Echocardiogram examinations, most                 recent 07/25/2023. CHF, Arrythmias:Atrial Fibrillation and                 Bradycardia, Signs/Symptoms:Edema; Risk Factors:Hypertension and                 Dyslipidemia.  Sonographer:    Tinnie Barefoot RDCS Referring Phys: [phone] TRACI R TURNER  Sonographer Comments: Technically difficult study due to poor echo windows. Patient supine due to neck injury. IMPRESSIONS  1. Left ventricular ejection fraction, by estimation, is 70 to 75%. The left ventricle has hyperdynamic function. The left ventricle has no regional wall motion abnormalities. There is mild concentric left ventricular hypertrophy.  Left ventricular diastolic function could not be evaluated.  2. Right ventricular systolic function is mildly reduced. The right ventricular size is normal. There is mildly elevated pulmonary artery systolic pressure.  3. Left atrial size was moderately dilated.  4. The mitral valve is degenerative. No evidence of mitral valve regurgitation. Mild mitral stenosis. Severe mitral annular calcification.  5. The aortic valve is normal in structure. There is severe calcifcation of the aortic valve. There is severe thickening of the aortic valve. Aortic valve regurgitation is trivial. Moderate aortic valve stenosis.  6. Aortic dilatation noted. There is moderate dilatation of the ascending aorta, measuring 47 mm.  7. The inferior vena cava is dilated in size with <50% respiratory variability, suggesting right atrial pressure of 15 mmHg. FINDINGS  Left Ventricle: Left ventricular ejection fraction, by estimation, is 70 to 75%. The left ventricle has hyperdynamic function. The left ventricle has no regional wall motion abnormalities. The left ventricular internal cavity size was small. There is mild concentric left ventricular hypertrophy. Left ventricular diastolic function could not be evaluated due to mitral annular calcification (moderate or greater). Left ventricular diastolic function could not be evaluated. Right Ventricle: The right ventricular size is normal. No increase in right ventricular wall thickness. Right ventricular systolic function is mildly reduced. There is mildly elevated pulmonary artery systolic pressure. The tricuspid regurgitant velocity  is 2.34 m/s, and with an assumed right atrial pressure of 15 mmHg, the estimated right ventricular systolic pressure is 36.9 mmHg. Left Atrium: Left atrial size was moderately dilated. Right Atrium: Right atrial size was normal in size. Pericardium: There is no evidence of pericardial effusion. Mitral Valve: The mitral valve is degenerative in appearance. Severe  mitral annular calcification. No evidence of mitral valve regurgitation. Mild mitral valve stenosis. MV peak gradient, 19.1 mmHg. The mean mitral valve gradient is 9.0 mmHg. Tricuspid Valve: The tricuspid valve is normal in structure. Tricuspid valve regurgitation is mild . No evidence of tricuspid stenosis. Aortic Valve: The aortic valve is normal in structure. There is severe calcifcation of the aortic valve. There is severe thickening of the aortic valve. Aortic valve regurgitation is trivial. Moderate aortic stenosis is present. Aortic valve mean gradient measures 22.0 mmHg. Aortic valve peak gradient measures 44.1 mmHg. Aortic valve area, by VTI measures 1.63 cm. Pulmonic Valve: The pulmonic valve was normal in structure. Pulmonic valve regurgitation is not visualized.  No evidence of pulmonic stenosis. Aorta: The aortic root is normal in size and structure and aortic dilatation noted. There is moderate dilatation of the ascending aorta, measuring 47 mm. Venous: The inferior vena cava is dilated in size with less than 50% respiratory variability, suggesting right atrial pressure of 15 mmHg. IAS/Shunts: No atrial level shunt detected by color flow Doppler.  LEFT VENTRICLE PLAX 2D LVIDd:         3.10 cm LVIDs:         1.80 cm LV PW:         1.40 cm LV IVS:        1.20 cm LVOT diam:     2.10 cm LV SV:         97 LV SV Index:   49 LVOT Area:     3.46 cm  RIGHT VENTRICLE            IVC RV Basal diam:  2.90 cm    IVC diam: 2.40 cm RV S prime:     9.25 cm/s TAPSE (M-mode): 0.9 cm LEFT ATRIUM             Index        RIGHT ATRIUM           Index LA diam:        5.10 cm 2.57 cm/m   RA Area:     19.10 cm LA Vol (A2C):   79.1 ml 39.92 ml/m  RA Volume:   49.70 ml  25.08 ml/m LA Vol (A4C):   76.6 ml 38.65 ml/m LA Biplane Vol: 78.1 ml 39.41 ml/m  AORTIC VALVE AV Area (Vmax):    1.28 cm AV Area (Vmean):   1.50 cm AV Area (VTI):     1.63 cm AV Vmax:           332.00 cm/s AV Vmean:          186.600 cm/s AV VTI:             0.595 m AV Peak Grad:      44.1 mmHg AV Mean Grad:      22.0 mmHg LVOT Vmax:         122.67 cm/s LVOT Vmean:        81.033 cm/s LVOT VTI:          0.279 m LVOT/AV VTI ratio: 0.47  AORTA Ao Root diam: 3.80 cm Ao Asc diam:  4.60 cm MITRAL VALVE              TRICUSPID VALVE MV Area VTI:  2.07 cm    TR Peak grad:   21.9 mmHg MV Peak grad: 19.1 mmHg   TR Vmax:        234.00 cm/s MV Mean grad: 9.0 mmHg MV Vmax:      2.18 m/s    SHUNTS MV Vmean:     104.4 cm/s  Systemic VTI:  0.28 m                           Systemic Diam: 2.10 cm Morene Brownie Electronically signed by Morene Brownie Signature Date/Time: 11/25/2023/4:54:49 PM    Final    DG Swallowing Func-Speech Pathology Result Date: 11/25/2023 Table formatting from the original result was not included. Modified Barium Swallow Study Patient Details Name: Zachary Underwood MRN: [id number] Date of Birth: 1927-05-25 Today's Date: 11/25/2023 HPI/PMH: HPI: Pt is a 89 y.o. male who presented to the  ED for evaluation after he fell and hit his head and neck. CT head and maxillofacial negative for acute changes. CT cervical spine: mildly displaced fractures through the posterior arch of C1 and nondisplaced fracture through the midline anterior arch of C1. MR cervical spine: Displaced type 2 odontoid fracture with significant posterior dislocation of the dens relative to the body of C2, osteophyte at C5-6. PMH: persistent atrial fibrillation on Xarelto , CVA, moderate aortic stenosis, s/p surgical repair of ascending thoracic aorta, HTN, HLD, BPH, chronic lower extremity edema. Clinical Impression: Clinical Impression: Pt presents with oropharyngeal dysphagia characterized by reduced bolus cohesion, lingual retraction, pharyngeal stripping, hyolaryngeal elevation, and anterior laryngeal movement. Premature spillage was noted to the pyriform sinuses and was often resulted in aspiration. Residue was noted on the base of tongue and posterior pharyngeal wall, and in the valleculae  and pyriform sinuses. Residue was reduced with secondary swallows. Penetration (PAS 5) and aspiration (PAS 7) were noted with thin liquids, nectar thick liquids, and honey thick liquids. Laryngeal invasion was improved to PAS 2 (occasionally 3) with nectar thick liquids when a combination of reduced bolus size, effortful swallows and secondary swallows were used. Despite use of multiple bolus consistencies and use of effortful swallows, pt was unable to propel the 13mm barium tablet past the level of the valleculae. A dysphagia 3 diet with nectar thick liquids is recommended with strict observance of swallowing precautions. Factors that may increase risk of adverse event in presence of aspiration [name] 2021): Factors that may increase risk of adverse event in presence of aspiration [name] 2021): Weak cough Recommendations/Plan: Swallowing Evaluation Recommendations Swallowing Evaluation Recommendations Recommendations: PO diet PO Diet Recommendation: Dysphagia 3 (Mechanical soft); Mildly thick liquids (Level 2, nectar thick) Liquid Administration via: Cup; No straw Medication Administration: Crushed with puree Supervision: Staff to assist with self-feeding; Full supervision/cueing for swallowing strategies; Intermittent supervision/cueing for swallowing strategies Swallowing strategies  : Slow rate; Small bites/sips; Minimize environmental distractions; effortful swallow; Multiple dry swallows after each bite/sip Postural changes: Stay upright 30-60 min after meals; Position pt fully upright for meals Oral care recommendations: Oral care BID (2x/day) Treatment Plan Treatment Plan Treatment recommendations: Therapy as outlined in treatment plan below Follow-up recommendations: Home health SLP Functional status assessment: Patient has had a recent decline in their functional status and demonstrates the ability to make significant improvements in function in a reasonable and predictable amount of  time. Treatment frequency: Min 2x/week Treatment duration: 2 weeks Interventions: Patient/family education; Trials of upgraded texture/liquids; Diet toleration management by SLP Recommendations Recommendations for follow up therapy are one component of a multi-disciplinary discharge planning process, led by the attending physician.  Recommendations may be updated based on patient status, additional functional criteria and insurance authorization. Assessment: Orofacial Exam: Orofacial Exam Oral Cavity: Oral Hygiene: WFL Oral Cavity - Dentition: Adequate natural dentition Anatomy: Anatomy: WFL Boluses Administered: Boluses Administered Boluses Administered: Thin liquids (Level 0); Mildly thick liquids (Level 2, nectar thick); Moderately thick liquids (Level 3, honey thick); Puree; Solid  Oral Impairment Domain: Oral Impairment Domain Lip Closure: No labial escape Tongue control during bolus hold: Posterior escape of greater than half of bolus Bolus preparation/mastication: Timely and efficient chewing and mashing Bolus transport/lingual motion: Delayed initiation of tongue motion (oral holding) Oral residue: Trace residue lining oral structures Location of oral residue : Tongue Initiation of pharyngeal swallow : Pyriform sinuses; Posterior laryngeal surface of the epiglottis  Pharyngeal Impairment Domain: Pharyngeal Impairment Domain Soft palate elevation: No bolus between  soft palate (SP)/pharyngeal wall (PW) Laryngeal elevation: Partial superior movement of thyroid cartilage/partial approximation of arytenoids to epiglottic petiole Anterior hyoid excursion: Partial anterior movement Epiglottic movement: Partial inversion Laryngeal vestibule closure: Incomplete, narrow column air/contrast in laryngeal vestibule Pharyngeal stripping wave : Present - diminished Pharyngeal contraction (A/P view only): N/A Pharyngoesophageal segment opening: Complete distension and complete duration, no obstruction of flow Tongue base  retraction: Wide column of contrast or air between tongue base and PPW Pharyngeal residue: Collection of residue within or on pharyngeal structures Location of pharyngeal residue: Valleculae; Pharyngeal wall; Pyriform sinuses; Tongue base  Esophageal Impairment Domain: Esophageal Impairment Domain Esophageal clearance upright position: Complete clearance, esophageal coating Pill: Pill Consistency administered: Thin liquids (Level 0); Mildly thick liquids (Level 2, nectar thick); Moderately thick liquids (Level 3, honey thick); Puree (Transport of pill halted at the valleculae) Thin liquids (Level 0): Impaired (see clinical impressions) Mildly thick liquids (Level 2, nectar thick): Impaired (see clinical impressions) Moderately thick liquids (Level 3, honey thick): Impaired (see clinical impressions) Puree: Impaired (see clinical impressions) Penetration/Aspiration Scale Score: Penetration/Aspiration Scale Score 1.  Material does not enter airway: Puree; Solid 2.  Material enters airway, remains ABOVE vocal cords then ejected out: Mildly thick liquids (Level 2, nectar thick) 5.  Material enters airway, CONTACTS cords and not ejected out: Thin liquids (Level 0); Mildly thick liquids (Level 2, nectar thick); Moderately thick liquids (Level 3, honey thick) 7.  Material enters airway, passes BELOW cords and not ejected out despite cough attempt by patient: Mildly thick liquids (Level 2, nectar thick); Thin liquids (Level 0); Moderately thick liquids (Level 3, honey thick) Compensatory Strategies: Compensatory Strategies Compensatory strategies: Yes Effortful swallow: Effective Effective Effortful Swallow: Mildly thick liquid (Level 2, nectar thick) Multiple swallows: Effective Effective Multiple Swallows: Mildly thick liquid (Level 2, nectar thick)   General Information: Caregiver present: No  Diet Prior to this Study: Dysphagia 3 (mechanical soft); Thin liquids (Level 0)   Temperature : Normal   Respiratory Status: WFL    Supplemental O2: Nasal cannula   History of Recent Intubation: No  Behavior/Cognition: Alert; Cooperative Self-Feeding Abilities: Able to self-feed Baseline vocal quality/speech: Normal Volitional Cough: Able to elicit Volitional Swallow: Able to elicit Exam Limitations: No limitations Goal Planning: Prognosis for improved oropharyngeal function: Good Barriers to Reach Goals: Severity of deficits No data recorded Patient/Family Stated Goal: none stated Consulted and agree with results and recommendations: Patient Pain: Pain Assessment Pain Assessment: Faces Faces Pain Scale: 6 Pain Location: neck with bed mobility Pain Descriptors / Indicators: Discomfort; Grimacing Pain Intervention(s): Limited activity within patient's tolerance; Monitored during session End of Session: Start Time:SLP Start Time (ACUTE ONLY): 1007 Stop Time: SLP Stop Time (ACUTE ONLY): 1038 Time Calculation:SLP Time Calculation (min) (ACUTE ONLY): 31 min Charges: SLP Evaluations $ SLP Speech Visit: 1 Visit SLP Evaluations $BSS Swallow: 1 Procedure $MBS Swallow: 1 Procedure SLP visit diagnosis: SLP Visit Diagnosis: Dysphagia, unspecified (R13.10) Past Medical History: Past Medical History: Diagnosis Date  Arthritis   Cardiac arrhythmia   Hypertension   Ruptured aortic aneurysm (HCC)   Stroke (HCC) 08/23/2021 Past Surgical History: Past Surgical History: Procedure Laterality Date  open heart surgery    ruptured aortic aneurysm repair   Shanika I. [place], MS, CCC-SLP Acute Rehabilitation Services Office number [phone] [name] [place] 11/25/2023, 12:30 PM  DG CHEST PORT 1 VIEW Result Date: 11/25/2023 CLINICAL DATA:  Shortness of breath EXAM: PORTABLE CHEST 1 VIEW COMPARISON:  11/22/2023 FINDINGS: artifact from presumed breathing mask projects over the  lung apices. Patient rotated left. Moderate cardiomegaly. Median sternotomy. Atherosclerosis in the transverse aorta. No pleural effusion or pneumothorax. Chronic interstitial coarsening is  nonspecific in this age group. No lobar consolidation. IMPRESSION: Cardiomegaly with chronic interstitial coarsening, nonspecific. Cannot exclude mild pulmonary venous congestion. Aortic Atherosclerosis (ICD10-I70.0). Electronically Signed   By: Rockey Kilts M.D.   On: 11/25/2023 08:27   MR Cervical Spine Wo Contrast Addendum Date: 11/22/2023 ADDENDUM #1 EXAM: MRI CERVICAL SPINE WITHOUT CONTRAST 11/22/2023 04:20:45 PM TECHNIQUE: Multiplanar multisequence MRI of the cervical spine was performed without the administration of intravenous contrast. COMPARISON: CT cervical spine earlier same day. CLINICAL HISTORY: Better assess seen fracture on CT as well as the hematoma. Pt given morphine  before exam. Difficult time laying flat, in a lot of pain. Propeller sequences ran as repeats. Best images possible. FINDINGS: BONES AND ALIGNMENT: Displaced type 2 odontoid fracture with significant posterior dislocation of the dens relative to the body of C2, similar to the earlier CT. Edema and possible blood products are interposed between the dens and posterior superior body of C2. Displacement of the fracture site indents the ventral thecal sac at C1-2. Vertebral body heights are otherwise maintained. No suspicious osseous lesion. SPINAL CORD: Flattening of the ventral cervical cord without evidence of compression. CSF flow artifact along the ventral aspect of the cervical cord at C1-2, compatible with significant stenosis. SOFT TISSUES: Significant prevertebral soft tissue swelling extending from the skull base to the level of C7-T1. Edema within the paraspinal musculature extending from the skull base to the level of C6. C2-C3: Additional disruption of the posterior longitudinal ligament along the posterior aspect of C2. Possible disruption of the ligamentum flavum at C2-3 and C3-4. Edema along the posterior atlantoaxial membrane suggestive of ligamentous injury. C3-C4: Possible disruption of the ligamentum flavum at C3-4.  C4-C5: No significant disc herniation. No spinal canal stenosis or neural foraminal narrowing. C5-C6: Disc osteophyte complex which indents the ventral thecal sac, contributing to mild spinal canal stenosis. C6-C7: Disc osteophyte complex which indents the ventral thecal sac. Facet arthrosis. C7-T1: No significant disc herniation. No spinal canal stenosis or neural foraminal narrowing. SPINAL CANAL STENOSIS: Moderate spinal canal stenosis at C1-2, best appreciated on sagittal images with limited evaluation on axial images due to positioning and motion artifact. Additional mild spinal canal stenosis at C5-6. IMPRESSION: 1. Displaced type 2 odontoid fracture with significant posterior dislocation of the dens relative to the body of C2. Associated edema and possible blood products interposed between dens and C2. No definite epidural hematoma. 2. Displacement of dens as well as malalignment of the bilateral atlantoaxial articulations results in moderate spinal canal stenosis and ventral cervical cord flattening. 3. Disruption of the anterior longitudinal ligament and posterior longitudinal ligament at C2. Edema along the posterior atlantoaxial membrane suggestive of ligamentous injury. Possible disruption of the ligamentum flavum at C2-3 and C3-4. 4. C1 fractures better seen on CT. 5. Significant prevertebral edema. 6. Disc osteophyte complexes at C5-6 contributing to mild spinal canal stenosis. 7. Findings discussed with Dr. Cecilia at 5:22 PM on 11/22/23. Electronically signed by: Donnice Mania MD 11/22/2023 05:37 PM EDT RP Workstation: [id number]   Result Date: 11/22/2023 ORIGINAL REPORT  EXAM: MRI CERVICAL SPINE WITHOUT CONTRAST 11/22/2023 04:20:45 PM TECHNIQUE: Multiplanar multisequence MRI of the cervical spine was performed without the administration of intravenous contrast. COMPARISON: CT cervical spine earlier same day. CLINICAL HISTORY: Better assess seen fracture on CT as well as the hematoma. Pt given morphine   before exam. Difficult time laying flat,  in a lot of pain. Propeller sequences ran as repeats. Best images possible. FINDINGS: BONES AND ALIGNMENT: Displaced type 2 odontoid fracture with significant posterior dislocation of the dens relative to the body of C2, similar to the earlier CT. Edema and possible blood products are interposed between the dens and posterior superior body of C2. Displacement of the fracture site indents the ventral thecal sac at C1-2. Vertebral body heights are otherwise maintained. No suspicious osseous lesion. SPINAL CORD: Flattening of the ventral cervical cord without evidence of compression. CSF flow artifact along the ventral aspect of the cervical cord at C1-2, compatible with significant stenosis. SOFT TISSUES: Significant prevertebral soft tissue swelling extending from the skull base to the level of C7-T1. Edema within the paraspinal musculature extending from the skull base to the level of C6. C2-C3: Additional disruption of the posterior longitudinal ligament along the posterior aspect of C2. Possible disruption of the ligamentum flavum at C2-3 and C3-4. Edema along the posterior atlantoaxial membrane suggestive of ligamentous injury. C3-C4: Possible disruption of the ligamentum flavum at C3-4. C4-C5: No significant disc herniation. No spinal canal stenosis or neural foraminal narrowing. C5-C6: Disc osteophyte complex which indents the ventral thecal sac, contributing to mild spinal canal stenosis. C6-C7: Disc osteophyte complex which indents the ventral thecal sac. Facet arthrosis. C7-T1: No significant disc herniation. No spinal canal stenosis or neural foraminal narrowing. SPINAL CANAL STENOSIS: Moderate spinal canal stenosis at C1-2, best appreciated on sagittal images with limited evaluation on axial images due to positioning and motion artifact. Additional mild spinal canal stenosis at C5-6. IMPRESSION: 1. Displaced type 2 odontoid fracture with significant posterior  dislocation of the dens relative to the body of C2. Associated edema and possible blood products interposed between dens and C2. No definite epidural hematoma. 2. Displacement of dens as well as malalignment of the bilateral atlantoaxial articulations results in moderate spinal canal stenosis and ventral cervical cord flattening. 3. Disruption of the anterior longitudinal ligament and posterior longitudinal ligament at C2. Edema along the posterior atlantoaxial membrane suggestive of ligamentous injury. Possible disruption of the ligamentum flavum at C2-3 and C3-4. 4. C1 fractures better seen on CT. 5. Significant prevertebral edema. 6. Disc osteophyte complexes at C5-6 contributing to mild spinal canal stenosis. Electronically signed by: Donnice Mania MD 11/22/2023 05:12 PM EDT RP Workstation: [id number]   CT CERVICAL SPINE WO CONTRAST Addendum Date: 11/22/2023 ADDENDUM #1 EXAM: CT CERVICAL SPINE WITHOUT CONTRAST 11/22/2023 01:07:00 PM TECHNIQUE: CT of the cervical spine was performed without the administration of intravenous contrast. Multiplanar reformatted images are provided for review. Automated exposure control, iterative reconstruction, and/or weight based adjustment of the mA/kV was utilized to reduce the radiation dose to as low as reasonably achievable. COMPARISON: CTA head and neck dated 08/16/2023. CLINICAL HISTORY: Polytrauma, blunt. Attempting to get in vehicle with caregiver. Mechanical fall while trying to get in vehicle. Hit head. Laceration to head. Complains of pain in the back of neck. On Xarelto . FINDINGS: CERVICAL SPINE: BONES AND ALIGNMENT: There is a displaced fracture through the base of the dens with approximately 12 mm posterior displacement relative to the body of C2. The dens remains articulated with the anterior arch of C1. There are additional mildly displaced fractures through the posterior arch of C1 along the right and left posterolateral aspects. There is likely an additional  nondisplaced fracture through the midline anterior arch of C1. Lucency within the left lateral mass of C2 extending into the left articular pillar favored to reflect a nutrient vessel foramen  baseline appearance on coronal images. Cervical lordosis is maintained. Similar anterolisthesis of C5 on C6 likely related to degenerative changes. There is no compression fracture in the cervical spine. DEGENERATIVE CHANGES: Facet arthrosis and uncovertebral hypertrophy at multiple levels in the cervical spine. SOFT TISSUES: Soft tissue surrounding the dens appears increased from prior and may reflect component of hematoma. There is prevertebral soft tissue swelling extending from C1 to C3-4 possible hematoma and fracture displacement results in at least moderate spinal canal stenosis at C1-2. The paraspinal musculature is unremarkable. IMPRESSION: 1. Displaced type II odontoid fracture with approximately 12 mm posterior displacement relative to the body of C2. Associated prevertebral soft tissue swelling and possible epidural hematoma. There is at least moderate spinal canal stenosis at C1-2. Recommend MRI cervical spine for further evaluation. 2. Additional mildly displaced fractures through the posterior arch of C1 and an additional nondisplaced fracture through the midline anterior arch of C1. 3. Similar anterolisthesis of C5 on C6 likely related to degenerative changes. 4. Findings discussed with Dr. Simon at 1:50PM on 11/22/23. Electronically signed by: Donnice Mania MD 11/22/2023 01:53 PM EDT RP Workstation: [id number]   Result Date: 11/22/2023 ORIGINAL REPORT EXAM: CT CERVICAL SPINE WITHOUT CONTRAST 11/22/2023 01:07:00 PM TECHNIQUE: CT of the cervical spine was performed without the administration of intravenous contrast. Multiplanar reformatted images are provided for review. Automated exposure control, iterative reconstruction, and/or weight based adjustment of the mA/kV was utilized to reduce the radiation dose to as  low as reasonably achievable. COMPARISON: CTA head and neck dated 08/16/2023. CLINICAL HISTORY: Polytrauma, blunt. Attempting to get in vehicle with caregiver. Mechanical fall while trying to get in vehicle. Hit head. Laceration to head. Complains of pain in the back of neck. On Xarelto . FINDINGS: CERVICAL SPINE: BONES AND ALIGNMENT: There is a displaced fracture through the base of the dens with approximately 12 mm posterior displacement relative to the body of C2. The dens remains articulated with the anterior arch of C1. There are additional mildly displaced fractures through the posterior arch of C1 along the right and left posterolateral aspects. There is likely an additional nondisplaced fracture through the midline anterior arch of C1. Lucency within the left lateral mass of C2 extending into the left articular pillar favored to reflect a nutrient vessel foramen baseline appearance on coronal images. Cervical lordosis is maintained. Similar anterolisthesis of C5 on C6 likely related to degenerative changes. There is no compression fracture in the cervical spine. DEGENERATIVE CHANGES: Facet arthrosis and uncovertebral hypertrophy at multiple levels in the cervical spine. SOFT TISSUES: Soft tissue surrounding the dens appears increased from prior and may reflect component of hematoma. There is prevertebral soft tissue swelling extending from C1 to C3-4 possible hematoma and fracture displacement results in at least moderate spinal canal stenosis at C1-2. The paraspinal musculature is unremarkable. IMPRESSION: 1. Displaced type II odontoid fracture with approximately 12 mm posterior displacement relative to the body of C2. Associated prevertebral soft tissue swelling and possible epidural hematoma. There is at least moderate spinal canal stenosis at C1-2. Recommend MRI cervical spine for further evaluation. 2. Additional mildly displaced fractures through the posterior arch of C1 and an additional nondisplaced  fracture through the midline anterior arch of C1. 3. Similar anterolisthesis of C5 on C6 likely related to degenerative changes. Electronically signed by: Donnice Mania MD 11/22/2023 01:33 PM EDT RP Workstation: [id number]   CT HEAD WO CONTRAST Result Date: 11/22/2023 CLINICAL DATA:  Provided history: Head trauma, moderate/severe. Facial trauma, blunt. Mechanical fall. Head laceration.  Neck pain. On Xarelto . EXAM: CT HEAD WITHOUT CONTRAST CT MAXILLOFACIAL WITHOUT CONTRAST TECHNIQUE: Multidetector CT imaging of the head and maxillofacial structures were performed using the standard protocol without intravenous contrast. Multiplanar CT image reconstructions of the maxillofacial structures were also generated. RADIATION DOSE REDUCTION: This exam was performed according to the departmental dose-optimization program which includes automated exposure control, adjustment of the mA and/or kV according to patient size and/or use of iterative reconstruction technique. COMPARISON:  Brain MRI 07/24/2023. CT angiogram head/neck 07/24/2023. FINDINGS: CT HEAD FINDINGS Brain: Generalized cerebral atrophy. 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 No demarcated cortical infarct. No extra-axial fluid collection. No evidence of an intracranial mass. No midline shift. Vascular: No hyperdense vessel.  Atherosclerotic calcifications. Skull: No calvarial fracture or aggressive osseous lesion. Other: Frontoparietal scalp laceration and hematoma. CT MAXILLOFACIAL FINDINGS Osseous: No acute maxillofacial fracture is identified. Orbits: No acute orbital finding. Sinuses: No significant paranasal sinus disease. Soft tissues: No maxillofacial hematoma appreciable by CT. Other: Acute cervical spine fractures separately reported on same day cervical spine CT. IMPRESSION: CT head: 1.  No evidence of an acute intracranial abnormality. 2. Frontoparietal scalp laceration and hematoma. 3.  Generalized cerebral atrophy. 4. 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 CT maxillofacial: 1. No evidence of an acute maxillofacial fracture. 2. Acute cervical spine fractures separately reported on same day cervical spine CT. Electronically Signed   By: Rockey Childs D.O.   On: 11/22/2023 13:39   CT MAXILLOFACIAL WO CONTRAST Result Date: 11/22/2023 CLINICAL DATA:  Provided history: Head trauma, moderate/severe. Facial trauma, blunt. Mechanical fall. Head laceration. Neck pain. On Xarelto . EXAM: CT HEAD WITHOUT CONTRAST CT MAXILLOFACIAL WITHOUT CONTRAST TECHNIQUE: Multidetector CT imaging of the head and maxillofacial structures were performed using the standard protocol without intravenous contrast. Multiplanar CT image reconstructions of the maxillofacial structures were also generated. RADIATION DOSE REDUCTION: This exam was performed according to the departmental dose-optimization program which includes automated exposure control, adjustment of the mA and/or kV according to patient size and/or use of iterative reconstruction technique. COMPARISON:  Brain MRI 07/24/2023. CT angiogram head/neck 07/24/2023. FINDINGS: CT HEAD FINDINGS Brain: Generalized cerebral atrophy. 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 No demarcated cortical infarct. No extra-axial fluid collection. No evidence of an intracranial mass. No midline shift. Vascular: No hyperdense vessel.  Atherosclerotic calcifications. Skull: No calvarial fracture or aggressive osseous lesion. Other: Frontoparietal scalp laceration and hematoma. CT MAXILLOFACIAL FINDINGS Osseous: No acute maxillofacial fracture is identified. Orbits: No acute orbital finding. Sinuses: No significant paranasal sinus disease. Soft tissues: No maxillofacial hematoma appreciable by CT. Other: Acute cervical spine fractures separately reported on same day cervical spine CT. IMPRESSION: CT head:  1.  No evidence of an acute intracranial abnormality. 2. Frontoparietal scalp laceration and hematoma. 3. Generalized cerebral atrophy. 4. 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 CT maxillofacial: 1. No evidence of an acute maxillofacial fracture. 2. Acute cervical spine fractures separately reported on same day cervical spine CT. Electronically Signed   By: Rockey Childs D.O.   On: 11/22/2023 13:39   CT CHEST ABDOMEN PELVIS W CONTRAST Result Date: 11/22/2023 CLINICAL DATA:  Fall while trying to get into car. EXAM: CT CHEST, ABDOMEN, AND PELVIS WITH CONTRAST TECHNIQUE: Multidetector CT imaging of the chest, abdomen and pelvis was performed following the standard protocol during bolus administration of intravenous contrast. RADIATION DOSE REDUCTION: This exam was performed according to the departmental  dose-optimization program which includes automated exposure control, adjustment of the mA and/or kV according to patient size and/or use of iterative reconstruction technique. CONTRAST:  75mL OMNIPAQUE  IOHEXOL  350 MG/ML SOLN COMPARISON:  April 24, 2022. 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 No dissection is noted. Mild cardiomegaly. No pericardial effusion. Coronary artery calcifications are noted. Mediastinum/Nodes: No enlarged mediastinal, hilar, or axillary lymph nodes. Thyroid gland, trachea, and esophagus demonstrate no significant findings. Lungs/Pleura: No pneumothorax or pleural effusion is noted. No acute pulmonary disease is noted. Musculoskeletal: 8.5 x 4.7 cm bilobed fat containing lesion is noted in left pectoral region which was present on prior exam, but now 13 mm solid nodule is noted. Lipoma or liposarcoma cannot be excluded. No definite osseous abnormality is noted. CT ABDOMEN PELVIS FINDINGS Hepatobiliary: No focal liver abnormality is seen. No gallstones,  gallbladder wall thickening, or biliary dilatation. Pancreas: Unremarkable. No pancreatic ductal dilatation or surrounding inflammatory changes. Spleen: Normal in size without focal abnormality. Adrenals/Urinary Tract: Adrenal glands are unremarkable. Kidneys are normal, without renal calculi, focal lesion, or hydronephrosis. Bladder is unremarkable. Stomach/Bowel: Stomach is unremarkable. No evidence of bowel obstruction or inflammation. Status post appendectomy. Vascular/Lymphatic: Aortic atherosclerosis. No enlarged abdominal or pelvic lymph nodes. Reproductive: Status post prostatic brachytherapy seed placement. Other: No ascites or hernia is noted. Musculoskeletal: Probable old L2 compression fracture. No acute osseous abnormality is noted. 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 Recommend semi-annual imaging followup by CTA or MRA and referral to cardiothoracic surgery if not already obtained. This recommendation follows 2010 ACCF/AHA/AATS/ACR/ASA/SCA/SCAI/SIR/STS/SVM Guidelines for the Diagnosis and Management of Patients With Thoracic Aortic Disease. Circulation. 2010; 121: [id number]. Aortic aneurysm NOS (ICD10-I71.9). 8.5 x 4.7 cm bilobed fatty lesion seen in left pectoral region which was present on prior exam of 2024, but now noted 13 mm nodule associated with this lesion. This may simply represent lipoma, but liposarcoma cannot be excluded. MRI may be performed for further evaluation. Status post prostatic brachytherapy seed placement. Probable old L2 compression fracture. Coronary artery calcifications are noted suggesting coronary artery disease. Aortic Atherosclerosis (ICD10-I70.0). Electronically Signed   By: Lynwood Landy Raddle M.D.   On: 11/22/2023 13:33   DG Chest Port 1 View Result Date: 11/22/2023 CLINICAL DATA:  Status post fall EXAM: PORTABLE CHEST 1 VIEW COMPARISON:  None Available. FINDINGS: Cardiomediastinal silhouette is  enlarged. Aortic knob is calcified. Bilateral vascular congestion and increased interstitial markings of both lung fields. Atherosclerotic calcifications of the aortic annulus is suspected. Query left basilar atelectasis. No pleural effusion or pneumothorax. Right posterior sixth rib healed fracture. No acute fracture identified. Median sternotomy. IMPRESSION: Enlarged cardiomediastinal silhouette and bilateral vascular congestion. Chronic healed fracture of right sixth rib. No new fracture within the limitations of chest radiograph. Electronically Signed   By: Megan  Zare M.D.   On: 11/22/2023 12:58   DG Pelvis Portable Result Date: 11/22/2023 CLINICAL DATA:  Fall. EXAM: PORTABLE PELVIS 1-2 VIEWS COMPARISON:  None Available. FINDINGS: There is no evidence of pelvic fracture or diastasis. No pelvic bone lesions are seen. IMPRESSION: Negative. Electronically Signed   By: Lynwood Landy Raddle M.D.   On: 11/22/2023 12:40    EKG: 12/03/23 AF with slow ventricular response 40 bpm (personally reviewed)  TELEMETRY: pt not on telemetry in rehab (personally reviewed)  DEVICE HISTORY: n/a  Assessment/Plan:  Permanent Atrial Fibrillation with Slow Ventricular Response  Secondary  Hypercoagulable State -longstanding history of permanent AF, on Xarelto .  Reportedly allergic to Eliquis .  -patient able to demonstrate chronotropic competence with PT efforts  -his symptoms of anxiety and shortness of breath do not appear to correlate to his baseline AF.  There is no evidence of high grade heart block -OAC per primary team for stroke prophylaxis  -avoid AV nodal blockers given known bradycardia  -no role for PPM at this time  -HR's at bedside in 40's on exam / palpation, patient asymptomatic    EP will be available PRN. Please call back if new needs arise.     For questions or updates, please contact [facility] Please consult [email or web address] for contact info under     Signed, Daphne Barrack, NP-C,  AGACNP-BC [facility] - Electrophysiology  12/03/2023, 1:09 PM

## 2023-12-03 NOTE — Consult Note (Signed)
 ELECTROPHYSIOLOGY CONSULT NOTE    Patient ID: ALCEE SIPOS MRN: [id number], DOB/AGE: [birth year] 89 y.o.  Admit date: 11/28/2023 Date of Consult: 12/03/2023  Primary Physician: [facility], [name] Primary Cardiologist: Dorn Lesches, MD  Electrophysiologist: Dr. Inocencio, new consult 12/03/23   Referring Provider: Dr. Emeline  Patient Profile: Zachary Underwood is a 89 y.o. male with a history of permanent AF, falls vs syncope (pt remembered falls), HTN, HLD, aortic atherosclerosis, CVA, syncope who is being seen today for the evaluation of bradycardia at the request of Dr. Emeline.  HPI:  Zachary Underwood is a 89 y.o. male who was admitted to [facility] on 11/28/23 after a hospitalization at [facility] from 8/1-11/28/23 for closed displaced fracture of the cervical spine / C1 fracture. He slipped and fell prior to admit to [facility] and hit his head / neck.  He denies ever losing consciousness. 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    He has had known permanent AF.  He is followed by Dr. Lesches for Cardiology.  He was seen in the AF Clinic 03/2023 after a cardiac monitor showed 100% AF/AFL with controlled V-rates, occ PVC's & multiple pauses that were nocturnal and not patient triggered. He has not been on AV nodal blockers due to bradycardia.  He reports this has been a known issue for him for many years.    He currently reports anxiety and shortness of breath when his bed is laid flat. He remains in a [place] J cervical collar. He works with PT and per staff is able to increase his HR up above 100 bpm.  He has not had the episodes of anxiety and shortness of breath with PT efforts. Only when bed is laid flat.   He denies chest pain, palpitations, dyspnea, PND, orthopnea, nausea, vomiting, dizziness, syncope, edema, weight gain, or early satiety.   Labs Potassium4.1 (08/11 0510)   Creatinine, ser  1.20 (08/11 0510) PLT  191 (08/11 0510) HGB  11.4* (08/11  0510) WBC 8.7 (08/11 0510)  .    Past Medical History:  Diagnosis Date   Arthritis    Cardiac arrhythmia    Hypertension    Ruptured aortic aneurysm [facility])    Stroke (HCC) 08/23/2021     Surgical History:  Past Surgical History:  Procedure Laterality Date   open heart surgery     ruptured aortic aneurysm repair       Medications Prior to Admission  Medication Sig Dispense Refill Last Dose/Taking   amoxicillin -clavulanate (AUGMENTIN ) 875-125 MG tablet Take 1 tablet by mouth every 12 (twelve) hours.      atorvastatin  (LIPITOR) 10 MG tablet Take 5 mg by mouth at bedtime.      [Paused] candesartan (ATACAND) 4 MG tablet Take 0.5 tablets by mouth at bedtime.      celecoxib  (CELEBREX ) 200 MG capsule Take 1 capsule (200 mg total) by mouth 2 (two) times daily.      feeding supplement (ENSURE PLUS HIGH PROTEIN) LIQD Take 237 mLs by mouth 2 (two) times daily between meals.      ferrous sulfate 325 (65 FE) MG EC tablet Take 325 mg by mouth daily.      fluticasone  (FLONASE ) 50 MCG/ACT nasal spray Place 2 sprays into both nostrils daily.      [EXPIRED] lactulose  (CHRONULAC ) 10 GM/15ML solution Take 30 mLs (20 g total) by mouth once for 1 dose.      lidocaine  (LIDODERM ) 5 % Place 1  patch onto the skin daily. Remove & Discard patch within 12 hours or as directed by MD      loratadine  (CLARITIN ) 10 MG tablet Take 1 tablet (10 mg total) by mouth daily.      methocarbamol  (ROBAXIN ) 500 MG tablet Take 1 tablet (500 mg total) by mouth 3 (three) times daily.      metolazone  (ZAROXOLYN ) 2.5 MG tablet Take 1 tablet (2.5 mg total) by mouth daily. 90 tablet 3    Multiple Vitamins-Minerals (MULTIVITAMIN WITH MINERALS) tablet Take 0.5 tablets by mouth daily.      oxyCODONE  (OXY IR/ROXICODONE ) 5 MG immediate release tablet Take 0.5 tablets (2.5 mg total) by mouth every 3 (three) hours as needed for moderate pain (pain score 4-6) or breakthrough pain.      pantoprazole  (PROTONIX ) 40 MG tablet Take 1 tablet (40  mg total) by mouth 2 (two) times daily before a meal.      polyethylene glycol (MIRALAX  / GLYCOLAX ) 17 g packet Take 17 g by mouth daily.      Rivaroxaban  (XARELTO ) 15 MG TABS tablet Take 15 mg by mouth daily with supper.      senna-docusate (SENOKOT-S) 8.6-50 MG tablet Take 1 tablet by mouth 2 (two) times daily.      sodium chloride  (OCEAN) 0.65 % SOLN nasal spray Place 1 spray into both nostrils as needed for congestion.      tamsulosin  (FLOMAX ) 0.4 MG CAPS capsule Take 0.4 mg by mouth at bedtime.      torsemide  (DEMADEX ) 20 MG tablet Take 1 tablet (20 mg total) by mouth daily as needed.       Inpatient Medications:   amoxicillin -clavulanate  1 tablet Oral Q12H   atorvastatin   5 mg Oral QHS   celecoxib   200 mg Oral BID   dexamethasone  (DECADRON ) injection  4 mg Intravenous Q12H   feeding supplement  237 mL Oral BID BM   fluticasone   2 spray Each Nare Daily   guaiFENesin   600 mg Oral BID   irbesartan   37.5 mg Oral Daily   lidocaine   1 patch Transdermal Q24H   loratadine   10 mg Oral Daily   methocarbamol   500 mg Oral TID   metolazone   2.5 mg Oral Daily   pantoprazole   40 mg Oral BID AC   polyethylene glycol  17 g Oral Daily   Rivaroxaban   15 mg Oral Q supper   senna-docusate  1 tablet Oral BID   tamsulosin   0.4 mg Oral QHS    Allergies:  Allergies  Allergen Reactions   Short Ragweed Pollen Ext Other (See Comments)    Unknown    Beef-Derived Drug Products Other (See Comments)    Patient does not eat beef    Eliquis  [Apixaban ] Itching   Lasix [Furosemide] Itching   Other Other (See Comments) and Hypertension    Fusid- Legs became very swollen   Tylenol [Acetaminophen] Hives    History reviewed. No pertinent family history.   Physical Exam: Vitals:   12/03/23 0509 12/03/23 0600 12/03/23 0925 12/03/23 1056  BP: (!) 159/82  (!) 143/53 (!) 143/62  Pulse: 60  (!) 53 (!) 46  Resp: 18     Temp: 97.8 F (36.6 C)     TempSrc: Oral     SpO2: 99%  93% 99%  Weight:  81.6 kg     Height:        GEN- elderly male lying in bed in NAD, A&O x 3, normal affect HEENT: Normocephalic, atraumatic,  cervical collar in place Lungs- CTAB, Normal effort.  Heart- Irregularly irregular rate and rhythm, No M/G/R.  GI- Soft, NT, ND.  Extremities- No clubbing, cyanosis, or edema   Radiology/Studies: DG CHEST PORT 1 VIEW Result Date: 12/03/2023 CLINICAL DATA:  Shortness of breath. EXAM: PORTABLE CHEST 1 VIEW COMPARISON:  Radiograph 11/30/2023, CT 11/26/2023 FINDINGS: Prior median sternotomy. Stable cardiomegaly. Unchanged mediastinal contours. Small pleural effusions persist. Stable pulmonary vasculature. No acute airspace disease or pneumothorax. IMPRESSION: Stable cardiomegaly. Small pleural effusions. Electronically Signed   By: Andrea Gasman M.D.   On: 12/03/2023 10:07   DG Swallowing Func-Speech Pathology Result Date: 12/02/2023 Table formatting from the original result was not included. Modified Barium Swallow Study Patient Details Name: Zachary Underwood MRN: [id number] Date of Birth: 09/04/1927 Today's Date: 12/02/2023 HPI/PMH: HPI: Gen Clagg is an active, still-working 89 y.o. male who presented to the ED for evaluation after he fell and hit his head and neck. CT head and maxillofacial negative for acute changes. CT cervical spine: mildly displaced fractures through the posterior arch of C1 and nondisplaced fracture through the midline anterior arch of C1. MR cervical spine: Displaced type 2 odontoid fracture with significant posterior dislocation of the dens relative to the body of C2, osteophyte at C5-6. MBS 11/25/23 -dysphagia 3, nectar liquids with precautions recommended. PMH: persistent atrial fibrillation on Xarelto , CVA, moderate aortic stenosis, s/p surgical repair of ascending thoracic aorta, HTN, HLD, BPH, chronic lower extremity edema. Clinical Impression: Clinical Impression: Pt presents w/ improved oropharyngeal dysphagia as compared to 8/4. However, mild dysphagia remains  overall. Trace oral residue and some posterior lingual weakness remain. Pharyngeally, he demonstrates improved airway protection. Despite this; reduced tongue base retraction, hyolaryngeal excursion, and pharyngeal constriction continue to negatively impact swallow efficiency. Subsequently, mild scattered pharyngeal residue remains after the swallow. Liquid wash was effective to reduce phayrngeal residue after puree and solid trials. Penetration (PAS 2 w/ nectar and PAS 4 w/ thin) noted w/ thin and mildly thick consistencies, however this is well WFL for pt's age. The only instance of aspiration noted (PAS 7) occured during the pill trial. Similar to prev MBSS, the barium tablet became lodged in his valleculae. Cervical collar negatively impacted his ROM, however, a chin tuck, additoinal liquid washes, and pureed consistencies were ineffective to clear the tablet. It was only cleared when pt eventually coughed up and expectorated said tablet. Of note, some potential prevertebral edema noted @ level of C2, however, difficult to differentiate d/t cervical lordosis. Hypertrophy of the cricopharyngeus noted as well. Anticipate recent cervical fx, potential edema, and deconditioning exacerbated baseline mild structural abnormalities and presbyphagia that patient was able to compensate for prior to this admission. Recommend continued Dys 3 textures and meds crushed in puree. Upgrade to thin liquids. Intermittent supervision during meals. Pt should also continue w/ previously recommended effortful swallow and double swallow to assist w/ pharyngeal clearance. Factors that may increase risk of adverse event in presence of aspiration [name] 2021): Factors that may increase risk of adverse event in presence of aspiration [name] 2021): Frail or deconditioned Recommendations/Plan: Swallowing Evaluation Recommendations Swallowing Evaluation Recommendations Recommendations: PO diet PO Diet Recommendation:  Dysphagia 3 (Mechanical soft); Thin liquids (Level 0) Liquid Administration via: Straw; Cup Medication Administration: Crushed with puree Supervision: Patient able to self-feed; Intermittent supervision/cueing for swallowing strategies Swallowing strategies  : Slow rate; Small bites/sips; effortful swallow; Multiple dry swallows after each bite/sip; Follow solids with liquids; Clear throat intermittently Postural changes: Stay upright 30-60  min after meals; Position pt fully upright for meals Oral care recommendations: Oral care BID (2x/day) Caregiver Recommendations: Have oral suction available Treatment Plan Treatment Plan Treatment recommendations: Therapy as outlined in treatment plan below Follow-up recommendations: Outpatient SLP Functional status assessment: Patient has had a recent decline in their functional status and demonstrates the ability to make significant improvements in function in a reasonable and predictable amount of time. Treatment frequency: Min 5x/week Treatment duration: 2 weeks Interventions: Oropharyngeal exercises; Patient/family education; Diet toleration management by SLP; Compensatory techniques; Aspiration precaution training; Respiratory muscle strength training Recommendations Recommendations for follow up therapy are one component of a multi-disciplinary discharge planning process, led by the attending physician.  Recommendations may be updated based on patient status, additional functional criteria and insurance authorization. Assessment: Orofacial Exam: Orofacial Exam Oral Cavity: Oral Hygiene: WFL Oral Cavity - Dentition: Adequate natural dentition Orofacial Anatomy: WFL Oral Motor/Sensory Function: WFL Anatomy: Anatomy: Prominent cricopharyngeus; Other (Comment); Suspected cervical osteophytes (cervical lordosis) Boluses Administered: Boluses Administered Boluses Administered: Thin liquids (Level 0); Mildly thick liquids (Level 2, nectar thick); Puree; Solid  Oral Impairment  Domain: Oral Impairment Domain Lip Closure: No labial escape Tongue control during bolus hold: Posterior escape of less than half of bolus Bolus preparation/mastication: Timely and efficient chewing and mashing Bolus transport/lingual motion: Repetitive/disorganized tongue motion Oral residue: Trace residue lining oral structures Location of oral residue : Tongue Initiation of pharyngeal swallow : Posterior laryngeal surface of the epiglottis; Pyriform sinuses  Pharyngeal Impairment Domain: Pharyngeal Impairment Domain Soft palate elevation: No bolus between soft palate (SP)/pharyngeal wall (PW) Laryngeal elevation: Partial superior movement of thyroid cartilage/partial approximation of arytenoids to epiglottic petiole Anterior hyoid excursion: Partial anterior movement Epiglottic movement: Partial inversion Laryngeal vestibule closure: Incomplete, narrow column air/contrast in laryngeal vestibule Pharyngeal stripping wave : Present - diminished Pharyngeal contraction (A/P view only): N/A Pharyngoesophageal segment opening: Partial distention/partial duration, partial obstruction of flow Tongue base retraction: Narrow column of contrast or air between tongue base and PPW Pharyngeal residue: Collection of residue within or on pharyngeal structures Location of pharyngeal residue: Valleculae; Pharyngeal wall; Tongue base; Pyriform sinuses; Diffuse (>3 areas)  Esophageal Impairment Domain: No data recorded Pill: Pill Consistency administered: Thin liquids (Level 0); Puree Thin liquids (Level 0): Impaired (see clinical impressions) Puree: Impaired (see clinical impressions) Penetration/Aspiration Scale Score: Penetration/Aspiration Scale Score 1.  Material does not enter airway: Puree; Solid 2.  Material enters airway, remains ABOVE vocal cords then ejected out: Mildly thick liquids (Level 2, nectar thick) 4.  Material enters airway, CONTACTS cords then ejected out: Thin liquids (Level 0) 7.  Material enters airway,  passes BELOW cords and not ejected out despite cough attempt by patient: Pill; Thin liquids (Level 0) (thin liquids during pill trial only) Compensatory Strategies: Compensatory Strategies Compensatory strategies: Yes Straw: Effective Effective Straw: Thin liquid (Level 0) Multiple swallows: Effective Effective Multiple Swallows: Thin liquid (Level 0); Mildly thick liquid (Level 2, nectar thick); Puree; Solid Chin tuck: Ineffective Ineffective Chin Tuck: Pill Liquid wash: Effective Effective Liquid Wash: Puree; Solid   General Information: Caregiver present: No  Diet Prior to this Study: Dysphagia 3 (mechanical soft); Mildly thick liquids (Level 2, nectar thick)   Temperature : Normal   Respiratory Status: WFL   Supplemental O2: None (Room air)   History of Recent Intubation: No  Behavior/Cognition: Alert; Cooperative Self-Feeding Abilities: Able to self-feed Baseline vocal quality/speech: Abnormal resonance Volitional Cough: Able to elicit Volitional Swallow: Able to elicit Exam Limitations: No limitations Goal Planning: Prognosis for  improved oropharyngeal function: Good Barriers to Reach Goals: Severity of deficits; Other (Comment) Barriers/Prognosis Comment: deconditioned state Patient/Family Stated Goal: improve dysphagia Consulted and agree with results and recommendations: Patient Pain: Pain Assessment Pain Assessment: Faces Faces Pain Scale: 6 Pain Location: headache - L side Pain Intervention(s): Monitored during session; Premedicated before session End of Session: Start Time:No data recorded Stop Time: No data recorded Time Calculation:No data recorded Charges: No data recorded SLP visit diagnosis: SLP Visit Diagnosis: Dysphagia, oropharyngeal phase (R13.12) Past Medical History: Past Medical History: Diagnosis Date  Arthritis   Cardiac arrhythmia   Hypertension   Ruptured aortic aneurysm (HCC)   Stroke (HCC) 08/23/2021 Past Surgical History: Past Surgical History: Procedure Laterality Date  open heart  surgery    ruptured aortic aneurysm repair   [name] 12/02/2023, 12:10 PM  DG Chest 2 View Result Date: 11/30/2023 CLINICAL DATA:  Shortness of breath. EXAM: CHEST - 2 VIEW COMPARISON:  Radiograph 11/25/2023, CT 11/26/2023 FINDINGS: Prior median sternotomy. Cardiomegaly is stable. Mediastinal contours are unchanged. Small bilateral pleural effusions again seen. Improvement in pulmonary edema. No confluent opacity or pneumothorax. IMPRESSION: 1. Improvement in pulmonary edema. 2. Small bilateral pleural effusions. 3. Stable cardiomegaly. Electronically Signed   By: Andrea Gasman M.D.   On: 11/30/2023 11:16   DG Cervical Spine 2 or 3 views Result Date: 11/28/2023 CLINICAL DATA:  Fracture. EXAM: CERVICAL SPINE - 2-3 VIEW COMPARISON:  CT 11/22/2023 FINDINGS: Displaced dens fracture with 12 mm posterior displacement of the proximal fracture fragment. No significant change in alignment from prior exam. The C1 fractures on prior CT are not well demonstrated by radiograph. Similar anterolisthesis of C4 on C5 and C5 on C6. Prevertebral soft tissue thickening is seen at the fracture site. AP views are limited due to osseous overlap. IMPRESSION: 1. Displaced dens fracture with 12 mm posterior displacement of the proximal fracture fragment. No significant change in alignment from prior exam. 2. C1 fractures on prior CT are not well demonstrated by radiograph. Electronically Signed   By: Andrea Gasman M.D.   On: 11/28/2023 14:24   CT Angio Chest Pulmonary Embolism (PE) W or WO Contrast Addendum Date: 11/26/2023 ADDENDUM REPORT: 11/26/2023 19:18 ADDENDUM: The previously seen bilobed fatty lesion in the left pectoral area is similar to prior CT. Follow-up as per recommendation of prior CT. Electronically Signed   By: Vanetta Chou M.D.   On: 11/26/2023 19:18   Result Date: 11/26/2023 CLINICAL DATA:  Concern for pulmonary embolism. EXAM: CT ANGIOGRAPHY CHEST WITH CONTRAST TECHNIQUE: Multidetector CT imaging of  the chest was performed using the standard protocol during bolus administration of intravenous contrast. Multiplanar CT image reconstructions and MIPs were obtained to evaluate the vascular anatomy. RADIATION DOSE REDUCTION: This exam was performed according to the departmental dose-optimization program which includes automated exposure control, adjustment of the mA and/or kV according to patient size and/or use of iterative reconstruction technique. CONTRAST:  75mL OMNIPAQUE  IOHEXOL  350 MG/ML SOLN COMPARISON:  CT chest abdomen pelvis dated 11/22/2023. FINDINGS: Cardiovascular: There is cardiomegaly. There is retrograde flow of contrast from the right atrium into the IVC suggestive of right heart dysfunction. There is coronary vascular calcification. Advanced atherosclerotic calcification of the thoracic aorta. There is aneurysmal dilatation of the ascending aorta measuring up to approximately 5 cm in diameter and progressed since the prior CT. Evaluation of the aorta is limited due to suboptimal opacification and timing of the contrast. No pulmonary artery embolus identified. Mediastinum/Nodes: No hilar adenopathy. Evaluation however is limited  due to consolidative changes of the adjacent lungs. Mildly enlarged lymph node anterior to the carina measures approximately 1 cm in short axis. The esophagus is grossly unremarkable. No mediastinal fluid collection. Lungs/Pleura: Small bilateral pleural effusions with partial compressive atelectasis of the lower lobes versus pneumonia. There is diffuse interstitial and interlobular septal prominence suggestive of edema. No pneumothorax. The central airways are patent. Upper Abdomen: No acute abnormality. Musculoskeletal: Osteopenia with degenerative changes of spine. Median sternotomy wires. No acute osseous pathology. Review of the MIP images confirms the above findings. IMPRESSION: 1. No CT evidence of pulmonary artery embolus. 2. Cardiomegaly with findings of right heart  dysfunction. 3. Small bilateral pleural effusions with partial compressive atelectasis of the lower lobes versus pneumonia. 4. Aneurysmal dilatation of the ascending aorta measuring up to 5 cm and progressed since the prior CT. Ascending thoracic aortic aneurysm. Recommend semi-annual imaging followup by CTA or MRA and referral to cardiothoracic surgery if not already obtained. This recommendation follows 2010 ACCF/AHA/AATS/ACR/ASA/SCA/SCAI/SIR/STS/SVM Guidelines for the Diagnosis and Management of Patients With Thoracic Aortic Disease. Circulation. 2010; 121: Z733-z630. Aortic aneurysm NOS (ICD10-I71.9) 5.  Aortic Atherosclerosis (ICD10-I70.0). Electronically Signed: By: Vanetta Chou M.D. On: 11/26/2023 19:00   DG Knee Complete 4 Views Left Result Date: 11/26/2023 CLINICAL DATA:  Left knee pain. EXAM: LEFT KNEE - COMPLETE 4+ VIEW COMPARISON:  None Available. FINDINGS: Moderate medial tibiofemoral joint space narrowing. Mild tricompartmental peripheral spurring. The bones are subjectively under mineralized. No evidence of fracture, erosion or focal bone abnormality. No significant joint effusion. Peripheral vascular calcifications are seen. IMPRESSION: Mild tricompartmental osteoarthritis, most prominent in the medial tibiofemoral compartment. Electronically Signed   By: Andrea Gasman M.D.   On: 11/26/2023 13:23   MR BRAIN WO CONTRAST Result Date: 11/26/2023 EXAM: MRI BRAIN WITHOUT CONTRAST 11/26/2023 03:40:00 AM TECHNIQUE: Multiplanar multisequence MRI of the head/brain was performed without the administration of intravenous contrast. COMPARISON: 07/24/2023 brain MRI 11/22/2023 CT cervical spine CLINICAL HISTORY: Neuro deficit, acute, stroke suspected. FINDINGS: BRAIN AND VENTRICLES: Old left cerebellar infarct. No acute infarct. No intracranial hemorrhage. No mass. No midline shift. No hydrocephalus. The sella is unremarkable. Normal flow voids. Minimal nonspecific white matter T2-weighted signal  hyperintensities, which may be associated with early chronic small vessel disease or migraine headaches. Normal volume loss. ORBITS: Ocular lens replacements. No acute abnormality. SINUSES AND MASTOIDS: No acute abnormality. BONES AND SOFT TISSUES: Known C2 fracture with posterior displacement of the dens relative to the C2 body. No acute soft tissue abnormality. IMPRESSION: 1. No acute intracranial abnormality. 2. Old left cerebellar infarct. 3. Minimal nonspecific white matter T2-weighted signal hyperintensities, possibly related to chronic small vessel disease 4. Known C2 fracture Electronically signed by: Franky Stanford MD 11/26/2023 03:47 AM EDT RP Workstation: [id number]   ECHOCARDIOGRAM COMPLETE Result Date: 11/25/2023    ECHOCARDIOGRAM REPORT   Patient Name:   [name] Date of Exam: 11/25/2023 Medical Rec #:  [id number]       Height:       68.0 in Accession #:    [id number]      Weight:       186.0 lb Date of Birth:  1928/01/12       BSA:          1.982 m Patient Age:    89 years        BP:           123/55 mmHg Patient Gender: M  HR:           56 bpm. Exam Location:  Inpatient Procedure: 2D Echo (Both Spectral and Color Flow Doppler were utilized during            procedure). Indications:    aortic stenosis  History:        Patient has prior history of Echocardiogram examinations, most                 recent 07/25/2023. CHF, Arrythmias:Atrial Fibrillation and                 Bradycardia, Signs/Symptoms:Edema; Risk Factors:Hypertension and                 Dyslipidemia.  Sonographer:    Tinnie Barefoot RDCS Referring Phys: [phone] TRACI R TURNER  Sonographer Comments: Technically difficult study due to poor echo windows. Patient supine due to neck injury. IMPRESSIONS  1. Left ventricular ejection fraction, by estimation, is 70 to 75%. The left ventricle has hyperdynamic function. The left ventricle has no regional wall motion abnormalities. There is mild concentric left ventricular hypertrophy.  Left ventricular diastolic function could not be evaluated.  2. Right ventricular systolic function is mildly reduced. The right ventricular size is normal. There is mildly elevated pulmonary artery systolic pressure.  3. Left atrial size was moderately dilated.  4. The mitral valve is degenerative. No evidence of mitral valve regurgitation. Mild mitral stenosis. Severe mitral annular calcification.  5. The aortic valve is normal in structure. There is severe calcifcation of the aortic valve. There is severe thickening of the aortic valve. Aortic valve regurgitation is trivial. Moderate aortic valve stenosis.  6. Aortic dilatation noted. There is moderate dilatation of the ascending aorta, measuring 47 mm.  7. The inferior vena cava is dilated in size with <50% respiratory variability, suggesting right atrial pressure of 15 mmHg. FINDINGS  Left Ventricle: Left ventricular ejection fraction, by estimation, is 70 to 75%. The left ventricle has hyperdynamic function. The left ventricle has no regional wall motion abnormalities. The left ventricular internal cavity size was small. There is mild concentric left ventricular hypertrophy. Left ventricular diastolic function could not be evaluated due to mitral annular calcification (moderate or greater). Left ventricular diastolic function could not be evaluated. Right Ventricle: The right ventricular size is normal. No increase in right ventricular wall thickness. Right ventricular systolic function is mildly reduced. There is mildly elevated pulmonary artery systolic pressure. The tricuspid regurgitant velocity  is 2.34 m/s, and with an assumed right atrial pressure of 15 mmHg, the estimated right ventricular systolic pressure is 36.9 mmHg. Left Atrium: Left atrial size was moderately dilated. Right Atrium: Right atrial size was normal in size. Pericardium: There is no evidence of pericardial effusion. Mitral Valve: The mitral valve is degenerative in appearance. Severe  mitral annular calcification. No evidence of mitral valve regurgitation. Mild mitral valve stenosis. MV peak gradient, 19.1 mmHg. The mean mitral valve gradient is 9.0 mmHg. Tricuspid Valve: The tricuspid valve is normal in structure. Tricuspid valve regurgitation is mild . No evidence of tricuspid stenosis. Aortic Valve: The aortic valve is normal in structure. There is severe calcifcation of the aortic valve. There is severe thickening of the aortic valve. Aortic valve regurgitation is trivial. Moderate aortic stenosis is present. Aortic valve mean gradient measures 22.0 mmHg. Aortic valve peak gradient measures 44.1 mmHg. Aortic valve area, by VTI measures 1.63 cm. Pulmonic Valve: The pulmonic valve was normal in structure. Pulmonic valve regurgitation is not visualized.  No evidence of pulmonic stenosis. Aorta: The aortic root is normal in size and structure and aortic dilatation noted. There is moderate dilatation of the ascending aorta, measuring 47 mm. Venous: The inferior vena cava is dilated in size with less than 50% respiratory variability, suggesting right atrial pressure of 15 mmHg. IAS/Shunts: No atrial level shunt detected by color flow Doppler.  LEFT VENTRICLE PLAX 2D LVIDd:         3.10 cm LVIDs:         1.80 cm LV PW:         1.40 cm LV IVS:        1.20 cm LVOT diam:     2.10 cm LV SV:         97 LV SV Index:   49 LVOT Area:     3.46 cm  RIGHT VENTRICLE            IVC RV Basal diam:  2.90 cm    IVC diam: 2.40 cm RV S prime:     9.25 cm/s TAPSE (M-mode): 0.9 cm LEFT ATRIUM             Index        RIGHT ATRIUM           Index LA diam:        5.10 cm 2.57 cm/m   RA Area:     19.10 cm LA Vol (A2C):   79.1 ml 39.92 ml/m  RA Volume:   49.70 ml  25.08 ml/m LA Vol (A4C):   76.6 ml 38.65 ml/m LA Biplane Vol: 78.1 ml 39.41 ml/m  AORTIC VALVE AV Area (Vmax):    1.28 cm AV Area (Vmean):   1.50 cm AV Area (VTI):     1.63 cm AV Vmax:           332.00 cm/s AV Vmean:          186.600 cm/s AV VTI:             0.595 m AV Peak Grad:      44.1 mmHg AV Mean Grad:      22.0 mmHg LVOT Vmax:         122.67 cm/s LVOT Vmean:        81.033 cm/s LVOT VTI:          0.279 m LVOT/AV VTI ratio: 0.47  AORTA Ao Root diam: 3.80 cm Ao Asc diam:  4.60 cm MITRAL VALVE              TRICUSPID VALVE MV Area VTI:  2.07 cm    TR Peak grad:   21.9 mmHg MV Peak grad: 19.1 mmHg   TR Vmax:        234.00 cm/s MV Mean grad: 9.0 mmHg MV Vmax:      2.18 m/s    SHUNTS MV Vmean:     104.4 cm/s  Systemic VTI:  0.28 m                           Systemic Diam: 2.10 cm Morene Brownie Electronically signed by Morene Brownie Signature Date/Time: 11/25/2023/4:54:49 PM    Final    DG Swallowing Func-Speech Pathology Result Date: 11/25/2023 Table formatting from the original result was not included. Modified Barium Swallow Study Patient Details Name: Zachary Underwood MRN: [id number] Date of Birth: 1927-05-25 Today's Date: 11/25/2023 HPI/PMH: HPI: Pt is a 89 y.o. male who presented to the  ED for evaluation after he fell and hit his head and neck. CT head and maxillofacial negative for acute changes. CT cervical spine: mildly displaced fractures through the posterior arch of C1 and nondisplaced fracture through the midline anterior arch of C1. MR cervical spine: Displaced type 2 odontoid fracture with significant posterior dislocation of the dens relative to the body of C2, osteophyte at C5-6. PMH: persistent atrial fibrillation on Xarelto , CVA, moderate aortic stenosis, s/p surgical repair of ascending thoracic aorta, HTN, HLD, BPH, chronic lower extremity edema. Clinical Impression: Clinical Impression: Pt presents with oropharyngeal dysphagia characterized by reduced bolus cohesion, lingual retraction, pharyngeal stripping, hyolaryngeal elevation, and anterior laryngeal movement. Premature spillage was noted to the pyriform sinuses and was often resulted in aspiration. Residue was noted on the base of tongue and posterior pharyngeal wall, and in the valleculae  and pyriform sinuses. Residue was reduced with secondary swallows. Penetration (PAS 5) and aspiration (PAS 7) were noted with thin liquids, nectar thick liquids, and honey thick liquids. Laryngeal invasion was improved to PAS 2 (occasionally 3) with nectar thick liquids when a combination of reduced bolus size, effortful swallows and secondary swallows were used. Despite use of multiple bolus consistencies and use of effortful swallows, pt was unable to propel the 13mm barium tablet past the level of the valleculae. A dysphagia 3 diet with nectar thick liquids is recommended with strict observance of swallowing precautions. Factors that may increase risk of adverse event in presence of aspiration [name] 2021): Factors that may increase risk of adverse event in presence of aspiration [name] 2021): Weak cough Recommendations/Plan: Swallowing Evaluation Recommendations Swallowing Evaluation Recommendations Recommendations: PO diet PO Diet Recommendation: Dysphagia 3 (Mechanical soft); Mildly thick liquids (Level 2, nectar thick) Liquid Administration via: Cup; No straw Medication Administration: Crushed with puree Supervision: Staff to assist with self-feeding; Full supervision/cueing for swallowing strategies; Intermittent supervision/cueing for swallowing strategies Swallowing strategies  : Slow rate; Small bites/sips; Minimize environmental distractions; effortful swallow; Multiple dry swallows after each bite/sip Postural changes: Stay upright 30-60 min after meals; Position pt fully upright for meals Oral care recommendations: Oral care BID (2x/day) Treatment Plan Treatment Plan Treatment recommendations: Therapy as outlined in treatment plan below Follow-up recommendations: Home health SLP Functional status assessment: Patient has had a recent decline in their functional status and demonstrates the ability to make significant improvements in function in a reasonable and predictable amount of  time. Treatment frequency: Min 2x/week Treatment duration: 2 weeks Interventions: Patient/family education; Trials of upgraded texture/liquids; Diet toleration management by SLP Recommendations Recommendations for follow up therapy are one component of a multi-disciplinary discharge planning process, led by the attending physician.  Recommendations may be updated based on patient status, additional functional criteria and insurance authorization. Assessment: Orofacial Exam: Orofacial Exam Oral Cavity: Oral Hygiene: WFL Oral Cavity - Dentition: Adequate natural dentition Anatomy: Anatomy: WFL Boluses Administered: Boluses Administered Boluses Administered: Thin liquids (Level 0); Mildly thick liquids (Level 2, nectar thick); Moderately thick liquids (Level 3, honey thick); Puree; Solid  Oral Impairment Domain: Oral Impairment Domain Lip Closure: No labial escape Tongue control during bolus hold: Posterior escape of greater than half of bolus Bolus preparation/mastication: Timely and efficient chewing and mashing Bolus transport/lingual motion: Delayed initiation of tongue motion (oral holding) Oral residue: Trace residue lining oral structures Location of oral residue : Tongue Initiation of pharyngeal swallow : Pyriform sinuses; Posterior laryngeal surface of the epiglottis  Pharyngeal Impairment Domain: Pharyngeal Impairment Domain Soft palate elevation: No bolus between  soft palate (SP)/pharyngeal wall (PW) Laryngeal elevation: Partial superior movement of thyroid cartilage/partial approximation of arytenoids to epiglottic petiole Anterior hyoid excursion: Partial anterior movement Epiglottic movement: Partial inversion Laryngeal vestibule closure: Incomplete, narrow column air/contrast in laryngeal vestibule Pharyngeal stripping wave : Present - diminished Pharyngeal contraction (A/P view only): N/A Pharyngoesophageal segment opening: Complete distension and complete duration, no obstruction of flow Tongue base  retraction: Wide column of contrast or air between tongue base and PPW Pharyngeal residue: Collection of residue within or on pharyngeal structures Location of pharyngeal residue: Valleculae; Pharyngeal wall; Pyriform sinuses; Tongue base  Esophageal Impairment Domain: Esophageal Impairment Domain Esophageal clearance upright position: Complete clearance, esophageal coating Pill: Pill Consistency administered: Thin liquids (Level 0); Mildly thick liquids (Level 2, nectar thick); Moderately thick liquids (Level 3, honey thick); Puree (Transport of pill halted at the valleculae) Thin liquids (Level 0): Impaired (see clinical impressions) Mildly thick liquids (Level 2, nectar thick): Impaired (see clinical impressions) Moderately thick liquids (Level 3, honey thick): Impaired (see clinical impressions) Puree: Impaired (see clinical impressions) Penetration/Aspiration Scale Score: Penetration/Aspiration Scale Score 1.  Material does not enter airway: Puree; Solid 2.  Material enters airway, remains ABOVE vocal cords then ejected out: Mildly thick liquids (Level 2, nectar thick) 5.  Material enters airway, CONTACTS cords and not ejected out: Thin liquids (Level 0); Mildly thick liquids (Level 2, nectar thick); Moderately thick liquids (Level 3, honey thick) 7.  Material enters airway, passes BELOW cords and not ejected out despite cough attempt by patient: Mildly thick liquids (Level 2, nectar thick); Thin liquids (Level 0); Moderately thick liquids (Level 3, honey thick) Compensatory Strategies: Compensatory Strategies Compensatory strategies: Yes Effortful swallow: Effective Effective Effortful Swallow: Mildly thick liquid (Level 2, nectar thick) Multiple swallows: Effective Effective Multiple Swallows: Mildly thick liquid (Level 2, nectar thick)   General Information: Caregiver present: No  Diet Prior to this Study: Dysphagia 3 (mechanical soft); Thin liquids (Level 0)   Temperature : Normal   Respiratory Status: WFL    Supplemental O2: Nasal cannula   History of Recent Intubation: No  Behavior/Cognition: Alert; Cooperative Self-Feeding Abilities: Able to self-feed Baseline vocal quality/speech: Normal Volitional Cough: Able to elicit Volitional Swallow: Able to elicit Exam Limitations: No limitations Goal Planning: Prognosis for improved oropharyngeal function: Good Barriers to Reach Goals: Severity of deficits No data recorded Patient/Family Stated Goal: none stated Consulted and agree with results and recommendations: Patient Pain: Pain Assessment Pain Assessment: Faces Faces Pain Scale: 6 Pain Location: neck with bed mobility Pain Descriptors / Indicators: Discomfort; Grimacing Pain Intervention(s): Limited activity within patient's tolerance; Monitored during session End of Session: Start Time:SLP Start Time (ACUTE ONLY): 1007 Stop Time: SLP Stop Time (ACUTE ONLY): 1038 Time Calculation:SLP Time Calculation (min) (ACUTE ONLY): 31 min Charges: SLP Evaluations $ SLP Speech Visit: 1 Visit SLP Evaluations $BSS Swallow: 1 Procedure $MBS Swallow: 1 Procedure SLP visit diagnosis: SLP Visit Diagnosis: Dysphagia, unspecified (R13.10) Past Medical History: Past Medical History: Diagnosis Date  Arthritis   Cardiac arrhythmia   Hypertension   Ruptured aortic aneurysm (HCC)   Stroke (HCC) 08/23/2021 Past Surgical History: Past Surgical History: Procedure Laterality Date  open heart surgery    ruptured aortic aneurysm repair   Shanika I. [place], MS, CCC-SLP Acute Rehabilitation Services Office number [phone] [name] [place] 11/25/2023, 12:30 PM  DG CHEST PORT 1 VIEW Result Date: 11/25/2023 CLINICAL DATA:  Shortness of breath EXAM: PORTABLE CHEST 1 VIEW COMPARISON:  11/22/2023 FINDINGS: artifact from presumed breathing mask projects over the  lung apices. Patient rotated left. Moderate cardiomegaly. Median sternotomy. Atherosclerosis in the transverse aorta. No pleural effusion or pneumothorax. Chronic interstitial coarsening is  nonspecific in this age group. No lobar consolidation. IMPRESSION: Cardiomegaly with chronic interstitial coarsening, nonspecific. Cannot exclude mild pulmonary venous congestion. Aortic Atherosclerosis (ICD10-I70.0). Electronically Signed   By: Rockey Kilts M.D.   On: 11/25/2023 08:27   MR Cervical Spine Wo Contrast Addendum Date: 11/22/2023 ADDENDUM #1 EXAM: MRI CERVICAL SPINE WITHOUT CONTRAST 11/22/2023 04:20:45 PM TECHNIQUE: Multiplanar multisequence MRI of the cervical spine was performed without the administration of intravenous contrast. COMPARISON: CT cervical spine earlier same day. CLINICAL HISTORY: Better assess seen fracture on CT as well as the hematoma. Pt given morphine  before exam. Difficult time laying flat, in a lot of pain. Propeller sequences ran as repeats. Best images possible. FINDINGS: BONES AND ALIGNMENT: Displaced type 2 odontoid fracture with significant posterior dislocation of the dens relative to the body of C2, similar to the earlier CT. Edema and possible blood products are interposed between the dens and posterior superior body of C2. Displacement of the fracture site indents the ventral thecal sac at C1-2. Vertebral body heights are otherwise maintained. No suspicious osseous lesion. SPINAL CORD: Flattening of the ventral cervical cord without evidence of compression. CSF flow artifact along the ventral aspect of the cervical cord at C1-2, compatible with significant stenosis. SOFT TISSUES: Significant prevertebral soft tissue swelling extending from the skull base to the level of C7-T1. Edema within the paraspinal musculature extending from the skull base to the level of C6. C2-C3: Additional disruption of the posterior longitudinal ligament along the posterior aspect of C2. Possible disruption of the ligamentum flavum at C2-3 and C3-4. Edema along the posterior atlantoaxial membrane suggestive of ligamentous injury. C3-C4: Possible disruption of the ligamentum flavum at C3-4.  C4-C5: No significant disc herniation. No spinal canal stenosis or neural foraminal narrowing. C5-C6: Disc osteophyte complex which indents the ventral thecal sac, contributing to mild spinal canal stenosis. C6-C7: Disc osteophyte complex which indents the ventral thecal sac. Facet arthrosis. C7-T1: No significant disc herniation. No spinal canal stenosis or neural foraminal narrowing. SPINAL CANAL STENOSIS: Moderate spinal canal stenosis at C1-2, best appreciated on sagittal images with limited evaluation on axial images due to positioning and motion artifact. Additional mild spinal canal stenosis at C5-6. IMPRESSION: 1. Displaced type 2 odontoid fracture with significant posterior dislocation of the dens relative to the body of C2. Associated edema and possible blood products interposed between dens and C2. No definite epidural hematoma. 2. Displacement of dens as well as malalignment of the bilateral atlantoaxial articulations results in moderate spinal canal stenosis and ventral cervical cord flattening. 3. Disruption of the anterior longitudinal ligament and posterior longitudinal ligament at C2. Edema along the posterior atlantoaxial membrane suggestive of ligamentous injury. Possible disruption of the ligamentum flavum at C2-3 and C3-4. 4. C1 fractures better seen on CT. 5. Significant prevertebral edema. 6. Disc osteophyte complexes at C5-6 contributing to mild spinal canal stenosis. 7. Findings discussed with Dr. Cecilia at 5:22 PM on 11/22/23. Electronically signed by: Donnice Mania MD 11/22/2023 05:37 PM EDT RP Workstation: [id number]   Result Date: 11/22/2023 ORIGINAL REPORT  EXAM: MRI CERVICAL SPINE WITHOUT CONTRAST 11/22/2023 04:20:45 PM TECHNIQUE: Multiplanar multisequence MRI of the cervical spine was performed without the administration of intravenous contrast. COMPARISON: CT cervical spine earlier same day. CLINICAL HISTORY: Better assess seen fracture on CT as well as the hematoma. Pt given morphine   before exam. Difficult time laying flat,  in a lot of pain. Propeller sequences ran as repeats. Best images possible. FINDINGS: BONES AND ALIGNMENT: Displaced type 2 odontoid fracture with significant posterior dislocation of the dens relative to the body of C2, similar to the earlier CT. Edema and possible blood products are interposed between the dens and posterior superior body of C2. Displacement of the fracture site indents the ventral thecal sac at C1-2. Vertebral body heights are otherwise maintained. No suspicious osseous lesion. SPINAL CORD: Flattening of the ventral cervical cord without evidence of compression. CSF flow artifact along the ventral aspect of the cervical cord at C1-2, compatible with significant stenosis. SOFT TISSUES: Significant prevertebral soft tissue swelling extending from the skull base to the level of C7-T1. Edema within the paraspinal musculature extending from the skull base to the level of C6. C2-C3: Additional disruption of the posterior longitudinal ligament along the posterior aspect of C2. Possible disruption of the ligamentum flavum at C2-3 and C3-4. Edema along the posterior atlantoaxial membrane suggestive of ligamentous injury. C3-C4: Possible disruption of the ligamentum flavum at C3-4. C4-C5: No significant disc herniation. No spinal canal stenosis or neural foraminal narrowing. C5-C6: Disc osteophyte complex which indents the ventral thecal sac, contributing to mild spinal canal stenosis. C6-C7: Disc osteophyte complex which indents the ventral thecal sac. Facet arthrosis. C7-T1: No significant disc herniation. No spinal canal stenosis or neural foraminal narrowing. SPINAL CANAL STENOSIS: Moderate spinal canal stenosis at C1-2, best appreciated on sagittal images with limited evaluation on axial images due to positioning and motion artifact. Additional mild spinal canal stenosis at C5-6. IMPRESSION: 1. Displaced type 2 odontoid fracture with significant posterior  dislocation of the dens relative to the body of C2. Associated edema and possible blood products interposed between dens and C2. No definite epidural hematoma. 2. Displacement of dens as well as malalignment of the bilateral atlantoaxial articulations results in moderate spinal canal stenosis and ventral cervical cord flattening. 3. Disruption of the anterior longitudinal ligament and posterior longitudinal ligament at C2. Edema along the posterior atlantoaxial membrane suggestive of ligamentous injury. Possible disruption of the ligamentum flavum at C2-3 and C3-4. 4. C1 fractures better seen on CT. 5. Significant prevertebral edema. 6. Disc osteophyte complexes at C5-6 contributing to mild spinal canal stenosis. Electronically signed by: Donnice Mania MD 11/22/2023 05:12 PM EDT RP Workstation: [id number]   CT CERVICAL SPINE WO CONTRAST Addendum Date: 11/22/2023 ADDENDUM #1 EXAM: CT CERVICAL SPINE WITHOUT CONTRAST 11/22/2023 01:07:00 PM TECHNIQUE: CT of the cervical spine was performed without the administration of intravenous contrast. Multiplanar reformatted images are provided for review. Automated exposure control, iterative reconstruction, and/or weight based adjustment of the mA/kV was utilized to reduce the radiation dose to as low as reasonably achievable. COMPARISON: CTA head and neck dated 08/16/2023. CLINICAL HISTORY: Polytrauma, blunt. Attempting to get in vehicle with caregiver. Mechanical fall while trying to get in vehicle. Hit head. Laceration to head. Complains of pain in the back of neck. On Xarelto . FINDINGS: CERVICAL SPINE: BONES AND ALIGNMENT: There is a displaced fracture through the base of the dens with approximately 12 mm posterior displacement relative to the body of C2. The dens remains articulated with the anterior arch of C1. There are additional mildly displaced fractures through the posterior arch of C1 along the right and left posterolateral aspects. There is likely an additional  nondisplaced fracture through the midline anterior arch of C1. Lucency within the left lateral mass of C2 extending into the left articular pillar favored to reflect a nutrient vessel foramen  baseline appearance on coronal images. Cervical lordosis is maintained. Similar anterolisthesis of C5 on C6 likely related to degenerative changes. There is no compression fracture in the cervical spine. DEGENERATIVE CHANGES: Facet arthrosis and uncovertebral hypertrophy at multiple levels in the cervical spine. SOFT TISSUES: Soft tissue surrounding the dens appears increased from prior and may reflect component of hematoma. There is prevertebral soft tissue swelling extending from C1 to C3-4 possible hematoma and fracture displacement results in at least moderate spinal canal stenosis at C1-2. The paraspinal musculature is unremarkable. IMPRESSION: 1. Displaced type II odontoid fracture with approximately 12 mm posterior displacement relative to the body of C2. Associated prevertebral soft tissue swelling and possible epidural hematoma. There is at least moderate spinal canal stenosis at C1-2. Recommend MRI cervical spine for further evaluation. 2. Additional mildly displaced fractures through the posterior arch of C1 and an additional nondisplaced fracture through the midline anterior arch of C1. 3. Similar anterolisthesis of C5 on C6 likely related to degenerative changes. 4. Findings discussed with Dr. Simon at 1:50PM on 11/22/23. Electronically signed by: Donnice Mania MD 11/22/2023 01:53 PM EDT RP Workstation: [id number]   Result Date: 11/22/2023 ORIGINAL REPORT EXAM: CT CERVICAL SPINE WITHOUT CONTRAST 11/22/2023 01:07:00 PM TECHNIQUE: CT of the cervical spine was performed without the administration of intravenous contrast. Multiplanar reformatted images are provided for review. Automated exposure control, iterative reconstruction, and/or weight based adjustment of the mA/kV was utilized to reduce the radiation dose to as  low as reasonably achievable. COMPARISON: CTA head and neck dated 08/16/2023. CLINICAL HISTORY: Polytrauma, blunt. Attempting to get in vehicle with caregiver. Mechanical fall while trying to get in vehicle. Hit head. Laceration to head. Complains of pain in the back of neck. On Xarelto . FINDINGS: CERVICAL SPINE: BONES AND ALIGNMENT: There is a displaced fracture through the base of the dens with approximately 12 mm posterior displacement relative to the body of C2. The dens remains articulated with the anterior arch of C1. There are additional mildly displaced fractures through the posterior arch of C1 along the right and left posterolateral aspects. There is likely an additional nondisplaced fracture through the midline anterior arch of C1. Lucency within the left lateral mass of C2 extending into the left articular pillar favored to reflect a nutrient vessel foramen baseline appearance on coronal images. Cervical lordosis is maintained. Similar anterolisthesis of C5 on C6 likely related to degenerative changes. There is no compression fracture in the cervical spine. DEGENERATIVE CHANGES: Facet arthrosis and uncovertebral hypertrophy at multiple levels in the cervical spine. SOFT TISSUES: Soft tissue surrounding the dens appears increased from prior and may reflect component of hematoma. There is prevertebral soft tissue swelling extending from C1 to C3-4 possible hematoma and fracture displacement results in at least moderate spinal canal stenosis at C1-2. The paraspinal musculature is unremarkable. IMPRESSION: 1. Displaced type II odontoid fracture with approximately 12 mm posterior displacement relative to the body of C2. Associated prevertebral soft tissue swelling and possible epidural hematoma. There is at least moderate spinal canal stenosis at C1-2. Recommend MRI cervical spine for further evaluation. 2. Additional mildly displaced fractures through the posterior arch of C1 and an additional nondisplaced  fracture through the midline anterior arch of C1. 3. Similar anterolisthesis of C5 on C6 likely related to degenerative changes. Electronically signed by: Donnice Mania MD 11/22/2023 01:33 PM EDT RP Workstation: [id number]   CT HEAD WO CONTRAST Result Date: 11/22/2023 CLINICAL DATA:  Provided history: Head trauma, moderate/severe. Facial trauma, blunt. Mechanical fall. Head laceration.  Neck pain. On Xarelto . EXAM: CT HEAD WITHOUT CONTRAST CT MAXILLOFACIAL WITHOUT CONTRAST TECHNIQUE: Multidetector CT imaging of the head and maxillofacial structures were performed using the standard protocol without intravenous contrast. Multiplanar CT image reconstructions of the maxillofacial structures were also generated. RADIATION DOSE REDUCTION: This exam was performed according to the departmental dose-optimization program which includes automated exposure control, adjustment of the mA and/or kV according to patient size and/or use of iterative reconstruction technique. COMPARISON:  Brain MRI 07/24/2023. CT angiogram head/neck 07/24/2023. FINDINGS: CT HEAD FINDINGS Brain: Generalized cerebral atrophy. 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 No demarcated cortical infarct. No extra-axial fluid collection. No evidence of an intracranial mass. No midline shift. Vascular: No hyperdense vessel.  Atherosclerotic calcifications. Skull: No calvarial fracture or aggressive osseous lesion. Other: Frontoparietal scalp laceration and hematoma. CT MAXILLOFACIAL FINDINGS Osseous: No acute maxillofacial fracture is identified. Orbits: No acute orbital finding. Sinuses: No significant paranasal sinus disease. Soft tissues: No maxillofacial hematoma appreciable by CT. Other: Acute cervical spine fractures separately reported on same day cervical spine CT. IMPRESSION: CT head: 1.  No evidence of an acute intracranial abnormality. 2. Frontoparietal scalp laceration and hematoma. 3.  Generalized cerebral atrophy. 4. 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 CT maxillofacial: 1. No evidence of an acute maxillofacial fracture. 2. Acute cervical spine fractures separately reported on same day cervical spine CT. Electronically Signed   By: Rockey Childs D.O.   On: 11/22/2023 13:39   CT MAXILLOFACIAL WO CONTRAST Result Date: 11/22/2023 CLINICAL DATA:  Provided history: Head trauma, moderate/severe. Facial trauma, blunt. Mechanical fall. Head laceration. Neck pain. On Xarelto . EXAM: CT HEAD WITHOUT CONTRAST CT MAXILLOFACIAL WITHOUT CONTRAST TECHNIQUE: Multidetector CT imaging of the head and maxillofacial structures were performed using the standard protocol without intravenous contrast. Multiplanar CT image reconstructions of the maxillofacial structures were also generated. RADIATION DOSE REDUCTION: This exam was performed according to the departmental dose-optimization program which includes automated exposure control, adjustment of the mA and/or kV according to patient size and/or use of iterative reconstruction technique. COMPARISON:  Brain MRI 07/24/2023. CT angiogram head/neck 07/24/2023. FINDINGS: CT HEAD FINDINGS Brain: Generalized cerebral atrophy. 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 No demarcated cortical infarct. No extra-axial fluid collection. No evidence of an intracranial mass. No midline shift. Vascular: No hyperdense vessel.  Atherosclerotic calcifications. Skull: No calvarial fracture or aggressive osseous lesion. Other: Frontoparietal scalp laceration and hematoma. CT MAXILLOFACIAL FINDINGS Osseous: No acute maxillofacial fracture is identified. Orbits: No acute orbital finding. Sinuses: No significant paranasal sinus disease. Soft tissues: No maxillofacial hematoma appreciable by CT. Other: Acute cervical spine fractures separately reported on same day cervical spine CT. IMPRESSION: CT head:  1.  No evidence of an acute intracranial abnormality. 2. Frontoparietal scalp laceration and hematoma. 3. Generalized cerebral atrophy. 4. 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 CT maxillofacial: 1. No evidence of an acute maxillofacial fracture. 2. Acute cervical spine fractures separately reported on same day cervical spine CT. Electronically Signed   By: Rockey Childs D.O.   On: 11/22/2023 13:39   CT CHEST ABDOMEN PELVIS W CONTRAST Result Date: 11/22/2023 CLINICAL DATA:  Fall while trying to get into car. EXAM: CT CHEST, ABDOMEN, AND PELVIS WITH CONTRAST TECHNIQUE: Multidetector CT imaging of the chest, abdomen and pelvis was performed following the standard protocol during bolus administration of intravenous contrast. RADIATION DOSE REDUCTION: This exam was performed according to the departmental  dose-optimization program which includes automated exposure control, adjustment of the mA and/or kV according to patient size and/or use of iterative reconstruction technique. CONTRAST:  75mL OMNIPAQUE  IOHEXOL  350 MG/ML SOLN COMPARISON:  April 24, 2022. 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 No dissection is noted. Mild cardiomegaly. No pericardial effusion. Coronary artery calcifications are noted. Mediastinum/Nodes: No enlarged mediastinal, hilar, or axillary lymph nodes. Thyroid gland, trachea, and esophagus demonstrate no significant findings. Lungs/Pleura: No pneumothorax or pleural effusion is noted. No acute pulmonary disease is noted. Musculoskeletal: 8.5 x 4.7 cm bilobed fat containing lesion is noted in left pectoral region which was present on prior exam, but now 13 mm solid nodule is noted. Lipoma or liposarcoma cannot be excluded. No definite osseous abnormality is noted. CT ABDOMEN PELVIS FINDINGS Hepatobiliary: No focal liver abnormality is seen. No gallstones,  gallbladder wall thickening, or biliary dilatation. Pancreas: Unremarkable. No pancreatic ductal dilatation or surrounding inflammatory changes. Spleen: Normal in size without focal abnormality. Adrenals/Urinary Tract: Adrenal glands are unremarkable. Kidneys are normal, without renal calculi, focal lesion, or hydronephrosis. Bladder is unremarkable. Stomach/Bowel: Stomach is unremarkable. No evidence of bowel obstruction or inflammation. Status post appendectomy. Vascular/Lymphatic: Aortic atherosclerosis. No enlarged abdominal or pelvic lymph nodes. Reproductive: Status post prostatic brachytherapy seed placement. Other: No ascites or hernia is noted. Musculoskeletal: Probable old L2 compression fracture. No acute osseous abnormality is noted. 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 Recommend semi-annual imaging followup by CTA or MRA and referral to cardiothoracic surgery if not already obtained. This recommendation follows 2010 ACCF/AHA/AATS/ACR/ASA/SCA/SCAI/SIR/STS/SVM Guidelines for the Diagnosis and Management of Patients With Thoracic Aortic Disease. Circulation. 2010; 121: [id number]. Aortic aneurysm NOS (ICD10-I71.9). 8.5 x 4.7 cm bilobed fatty lesion seen in left pectoral region which was present on prior exam of 2024, but now noted 13 mm nodule associated with this lesion. This may simply represent lipoma, but liposarcoma cannot be excluded. MRI may be performed for further evaluation. Status post prostatic brachytherapy seed placement. Probable old L2 compression fracture. Coronary artery calcifications are noted suggesting coronary artery disease. Aortic Atherosclerosis (ICD10-I70.0). Electronically Signed   By: Lynwood Landy Raddle M.D.   On: 11/22/2023 13:33   DG Chest Port 1 View Result Date: 11/22/2023 CLINICAL DATA:  Status post fall EXAM: PORTABLE CHEST 1 VIEW COMPARISON:  None Available. FINDINGS: Cardiomediastinal silhouette is  enlarged. Aortic knob is calcified. Bilateral vascular congestion and increased interstitial markings of both lung fields. Atherosclerotic calcifications of the aortic annulus is suspected. Query left basilar atelectasis. No pleural effusion or pneumothorax. Right posterior sixth rib healed fracture. No acute fracture identified. Median sternotomy. IMPRESSION: Enlarged cardiomediastinal silhouette and bilateral vascular congestion. Chronic healed fracture of right sixth rib. No new fracture within the limitations of chest radiograph. Electronically Signed   By: Megan  Zare M.D.   On: 11/22/2023 12:58   DG Pelvis Portable Result Date: 11/22/2023 CLINICAL DATA:  Fall. EXAM: PORTABLE PELVIS 1-2 VIEWS COMPARISON:  None Available. FINDINGS: There is no evidence of pelvic fracture or diastasis. No pelvic bone lesions are seen. IMPRESSION: Negative. Electronically Signed   By: Lynwood Landy Raddle M.D.   On: 11/22/2023 12:40    EKG: 12/03/23 AF with slow ventricular response 40 bpm (personally reviewed)  TELEMETRY: pt not on telemetry in rehab (personally reviewed)  DEVICE HISTORY: n/a  Assessment/Plan:  Permanent Atrial Fibrillation with Slow Ventricular Response  Secondary  Hypercoagulable State -longstanding history of permanent AF, on Xarelto .  Reportedly allergic to Eliquis .  -patient able to demonstrate chronotropic competence with PT efforts  -his symptoms of anxiety and shortness of breath do not appear to correlate to his baseline AF.  There is no evidence of high grade heart block -OAC per primary team for stroke prophylaxis  -avoid AV nodal blockers given known bradycardia  -no role for PPM at this time  -HR's at bedside in 40's on exam / palpation, patient asymptomatic    EP will be available PRN. Please call back if new needs arise.     For questions or updates, please contact [facility] Please consult [email or web address] for contact info under     Signed, Daphne Barrack, NP-C,  AGACNP-BC [facility] - Electrophysiology  12/03/2023, 1:09 PM

## 2023-12-03 NOTE — Progress Notes (Addendum)
 PROGRESS NOTE   Subjective/Complaints:  No events overnight.   Reported 2 episodes of SOB, anxiety, and HR 30-50s this AM with BP 170s SBP max; responsive to PRN xanax , CXR looks stable, EKG brady in the 40s with read saying a fib but appears sawtooth pattern ?flutter.   Incontinent of bladder this AM during episode.   Patient endorses SOB on exam but is laying in bed, breathing comfortably.    ROS: Denies fevers, chills, N/V, abdominal pain, constipation, diarrhea,cough, chest pain, new weakness or paraesthesias.   + Dysphagia--ongoing + Hard of hearing--stable + Intermittent shortness of breath and anxiety- ongoing, worse today + Left sided headache --ongoing  Objective:   DG CHEST PORT 1 VIEW Result Date: 12/03/2023 CLINICAL DATA:  Shortness of breath. EXAM: PORTABLE CHEST 1 VIEW COMPARISON:  Radiograph 11/30/2023, CT 11/26/2023 FINDINGS: Prior median sternotomy. Stable cardiomegaly. Unchanged mediastinal contours. Small pleural effusions persist. Stable pulmonary vasculature. No acute airspace disease or pneumothorax. IMPRESSION: Stable cardiomegaly. Small pleural effusions. Electronically Signed   By: Andrea Gasman M.D.   On: 12/03/2023 10:07   DG Swallowing Func-Speech Pathology Result Date: 12/02/2023 Table formatting from the original result was not included. Modified Barium Swallow Study Patient Details Name: Zachary Underwood MRN: [id number] Date of Birth: March 07, 1928 Today's Date: 12/02/2023 HPI/PMH: HPI: Zachary Underwood is an active, still-working 89 y.o. male who presented to the ED for evaluation after he fell and hit his head and neck. CT head and maxillofacial negative for acute changes. CT cervical spine: mildly displaced fractures through the posterior arch of C1 and nondisplaced fracture through the midline anterior arch of C1. MR cervical spine: Displaced type 2 odontoid fracture with significant posterior dislocation  of the dens relative to the body of C2, osteophyte at C5-6. MBS 11/25/23 -dysphagia 3, nectar liquids with precautions recommended. PMH: persistent atrial fibrillation on Xarelto , CVA, moderate aortic stenosis, s/p surgical repair of ascending thoracic aorta, HTN, HLD, BPH, chronic lower extremity edema. Clinical Impression: Clinical Impression: Pt presents w/ improved oropharyngeal dysphagia as compared to 8/4. However, mild dysphagia remains overall. Trace oral residue and some posterior lingual weakness remain. Pharyngeally, he demonstrates improved airway protection. Despite this; reduced tongue base retraction, hyolaryngeal excursion, and pharyngeal constriction continue to negatively impact swallow efficiency. Subsequently, mild scattered pharyngeal residue remains after the swallow. Liquid wash was effective to reduce phayrngeal residue after puree and solid trials. Penetration (PAS 2 w/ nectar and PAS 4 w/ thin) noted w/ thin and mildly thick consistencies, however this is well WFL for pt's age. The only instance of aspiration noted (PAS 7) occured during the pill trial. Similar to prev MBSS, the barium tablet became lodged in his valleculae. Cervical collar negatively impacted his ROM, however, a chin tuck, additoinal liquid washes, and pureed consistencies were ineffective to clear the tablet. It was only cleared when pt eventually coughed up and expectorated said tablet. Of note, some potential prevertebral edema noted @ level of C2, however, difficult to differentiate d/t cervical lordosis. Hypertrophy of the cricopharyngeus noted as well. Anticipate recent cervical fx, potential edema, and deconditioning exacerbated baseline mild structural abnormalities and presbyphagia that patient was able to compensate for prior to this admission.  Recommend continued Dys 3 textures and meds crushed in puree. Upgrade to thin liquids. Intermittent supervision during meals. Pt should also continue w/ previously  recommended effortful swallow and double swallow to assist w/ pharyngeal clearance. Factors that may increase risk of adverse event in presence of aspiration [name] 2021): Factors that may increase risk of adverse event in presence of aspiration [name] 2021): Frail or deconditioned Recommendations/Plan: Swallowing Evaluation Recommendations Swallowing Evaluation Recommendations Recommendations: PO diet PO Diet Recommendation: Dysphagia 3 (Mechanical soft); Thin liquids (Level 0) Liquid Administration via: Straw; Cup Medication Administration: Crushed with puree Supervision: Patient able to self-feed; Intermittent supervision/cueing for swallowing strategies Swallowing strategies  : Slow rate; Small bites/sips; effortful swallow; Multiple dry swallows after each bite/sip; Follow solids with liquids; Clear throat intermittently Postural changes: Stay upright 30-60 min after meals; Position pt fully upright for meals Oral care recommendations: Oral care BID (2x/day) Caregiver Recommendations: Have oral suction available Treatment Plan Treatment Plan Treatment recommendations: Therapy as outlined in treatment plan below Follow-up recommendations: Outpatient SLP Functional status assessment: Patient has had a recent decline in their functional status and demonstrates the ability to make significant improvements in function in a reasonable and predictable amount of time. Treatment frequency: Min 5x/week Treatment duration: 2 weeks Interventions: Oropharyngeal exercises; Patient/family education; Diet toleration management by SLP; Compensatory techniques; Aspiration precaution training; Respiratory muscle strength training Recommendations Recommendations for follow up therapy are one component of a multi-disciplinary discharge planning process, led by the attending physician.  Recommendations may be updated based on patient status, additional functional criteria and insurance authorization.  Assessment: Orofacial Exam: Orofacial Exam Oral Cavity: Oral Hygiene: WFL Oral Cavity - Dentition: Adequate natural dentition Orofacial Anatomy: WFL Oral Motor/Sensory Function: WFL Anatomy: Anatomy: Prominent cricopharyngeus; Other (Comment); Suspected cervical osteophytes (cervical lordosis) Boluses Administered: Boluses Administered Boluses Administered: Thin liquids (Level 0); Mildly thick liquids (Level 2, nectar thick); Puree; Solid  Oral Impairment Domain: Oral Impairment Domain Lip Closure: No labial escape Tongue control during bolus hold: Posterior escape of less than half of bolus Bolus preparation/mastication: Timely and efficient chewing and mashing Bolus transport/lingual motion: Repetitive/disorganized tongue motion Oral residue: Trace residue lining oral structures Location of oral residue : Tongue Initiation of pharyngeal swallow : Posterior laryngeal surface of the epiglottis; Pyriform sinuses  Pharyngeal Impairment Domain: Pharyngeal Impairment Domain Soft palate elevation: No bolus between soft palate (SP)/pharyngeal wall (PW) Laryngeal elevation: Partial superior movement of thyroid cartilage/partial approximation of arytenoids to epiglottic petiole Anterior hyoid excursion: Partial anterior movement Epiglottic movement: Partial inversion Laryngeal vestibule closure: Incomplete, narrow column air/contrast in laryngeal vestibule Pharyngeal stripping wave : Present - diminished Pharyngeal contraction (A/P view only): N/A Pharyngoesophageal segment opening: Partial distention/partial duration, partial obstruction of flow Tongue base retraction: Narrow column of contrast or air between tongue base and PPW Pharyngeal residue: Collection of residue within or on pharyngeal structures Location of pharyngeal residue: Valleculae; Pharyngeal wall; Tongue base; Pyriform sinuses; Diffuse (>3 areas)  Esophageal Impairment Domain: No data recorded Pill: Pill Consistency administered: Thin liquids (Level 0);  Puree Thin liquids (Level 0): Impaired (see clinical impressions) Puree: Impaired (see clinical impressions) Penetration/Aspiration Scale Score: Penetration/Aspiration Scale Score 1.  Material does not enter airway: Puree; Solid 2.  Material enters airway, remains ABOVE vocal cords then ejected out: Mildly thick liquids (Level 2, nectar thick) 4.  Material enters airway, CONTACTS cords then ejected out: Thin liquids (Level 0) 7.  Material enters airway, passes BELOW cords and not ejected out despite cough attempt by patient:  Pill; Thin liquids (Level 0) (thin liquids during pill trial only) Compensatory Strategies: Compensatory Strategies Compensatory strategies: Yes Straw: Effective Effective Straw: Thin liquid (Level 0) Multiple swallows: Effective Effective Multiple Swallows: Thin liquid (Level 0); Mildly thick liquid (Level 2, nectar thick); Puree; Solid Chin tuck: Ineffective Ineffective Chin Tuck: Pill Liquid wash: Effective Effective Liquid Wash: Puree; Solid   General Information: Caregiver present: No  Diet Prior to this Study: Dysphagia 3 (mechanical soft); Mildly thick liquids (Level 2, nectar thick)   Temperature : Normal   Respiratory Status: WFL   Supplemental O2: None (Room air)   History of Recent Intubation: No  Behavior/Cognition: Alert; Cooperative Self-Feeding Abilities: Able to self-feed Baseline vocal quality/speech: Abnormal resonance Volitional Cough: Able to elicit Volitional Swallow: Able to elicit Exam Limitations: No limitations Goal Planning: Prognosis for improved oropharyngeal function: Good Barriers to Reach Goals: Severity of deficits; Other (Comment) Barriers/Prognosis Comment: deconditioned state Patient/Family Stated Goal: improve dysphagia Consulted and agree with results and recommendations: Patient Pain: Pain Assessment Pain Assessment: Faces Faces Pain Scale: 6 Pain Location: headache - L side Pain Intervention(s): Monitored during session; Premedicated before session End of  Session: Start Time:No data recorded Stop Time: No data recorded Time Calculation:No data recorded Charges: No data recorded SLP visit diagnosis: SLP Visit Diagnosis: Dysphagia, oropharyngeal phase (R13.12) Past Medical History: Past Medical History: Diagnosis Date  Arthritis   Cardiac arrhythmia   Hypertension   Ruptured aortic aneurysm (HCC)   Stroke (HCC) 08/23/2021 Past Surgical History: Past Surgical History: Procedure Laterality Date  open heart surgery    ruptured aortic aneurysm repair   [facility] 12/02/2023, 12:10 PM  Recent Labs    12/02/23 0510  WBC 8.7  HGB 11.4*  HCT 36.1*  PLT 191   Recent Labs    12/02/23 0510  NA 142  K 4.1  CL 110  CO2 23  GLUCOSE 90  BUN 34*  CREATININE 1.20  CALCIUM  9.5    Intake/Output Summary (Last 24 hours) at 12/03/2023 1010 Last data filed at 12/03/2023 1006 Gross per 24 hour  Intake 240 ml  Output 100 ml  Net 140 ml        Physical Exam: Vital Signs Blood pressure (!) 143/53, pulse (!) 53, temperature 97.8 F (36.6 C), temperature source Oral, resp. rate 18, height 5' 8 (1.727 m), weight 81.6 kg, SpO2 93%.    General: No apparent distress.  Laying in bed; difficult to arouse but once awake pleasant and appropriate.  HEENT: EOMI.  PERRLA.  No nystagmus.  Head is normocephalic, hearing aids in place. + Hard cervical collar--well-fitting Neck: Supple without JVD or lymphadenopathy.  Heart: Bradycardic rate 40s, irregular rhythm.  + systolic ejection murmur Chest: CTA bilaterally without wheezes, rales, or rhonchi; no distress.     Abdomen: Soft, non-tender, non-distended, bowel sounds positive. Extremities: No clubbing, cyanosis. Pulses are 2+.  Right upper extremity edema trace  Psych: Pt's affect is appropriate. Pt is cooperative.  Skin: Clean and intact without signs of breakdown  + L shoulder - Chronic Lipoma--stable  + Right anterior scalp laceration --stable--under mepilex dressing, c/d  Neuro:   Mental Status:  Lethargic but arrousable; oriented to self, place, and time. No obvious cognitive deficits.   Speech/Languate: Intact, mild dysarthria.  100% intelligible. CRANIAL NERVES: 2 through 12 grossly intact   MOTOR: unchanged RUE: 3/5 Deltoid, 4/5 Biceps, 4/5 Triceps,4/5 Grip LUE: 3/5 Deltoid, 4/5 Biceps, 4/5 Triceps, 4/5 Grip RLE: HF 4/5, KE 4/5, ADF 4/5, APF 4/5 LLE:  HF 4/5, KE 4/5, ADF 4/5, APF 4/5     SENSORY: Normal to touch all 4 extremities. No sensitivity or sensory loss over L V1-2   Coordination: No ataxia, no tremor   MSK: Right shoulder abduction limited to less than 90 degrees; crepitus on passive range of motion--unchanged  Assessment/Plan: 1. Functional deficits which require 3+ hours per day of interdisciplinary therapy in a comprehensive inpatient rehab setting. Physiatrist is providing close team supervision and 24 hour management of active medical problems listed below. Physiatrist and rehab team continue to assess barriers to discharge/monitor patient progress toward functional and medical goals  Care Tool:  Bathing    Body parts bathed by patient: Right arm, Left arm, Abdomen, Chest, Front perineal area, Buttocks, Right upper leg, Left upper leg, Face   Body parts bathed by helper: Right lower leg, Left lower leg     Bathing assist Assist Level: Minimal Assistance - Patient > 75%     Upper Body Dressing/Undressing Upper body dressing   What is the patient wearing?: Hospital gown only    Upper body assist Assist Level: Minimal Assistance - Patient > 75%    Lower Body Dressing/Undressing Lower body dressing    Lower body dressing activity did not occur: Refused       Lower body assist       Toileting Toileting    Toileting assist Assist for toileting: Minimal Assistance - Patient > 75%     Transfers Chair/bed transfer  Transfers assist     Chair/bed transfer assist level: Minimal Assistance - Patient > 75%      Locomotion Ambulation   Ambulation assist      Assist level: Contact Guard/Touching assist Assistive device: Rollator Max distance: 150   Walk 10 feet activity   Assist     Assist level: Contact Guard/Touching assist Assistive device: Rollator   Walk 50 feet activity   Assist    Assist level: Contact Guard/Touching assist Assistive device: Rollator    Walk 150 feet activity   Assist    Assist level: Contact Guard/Touching assist Assistive device: Rollator    Walk 10 feet on uneven surface  activity   Assist     Assist level: Minimal Assistance - Patient > 75% Assistive device: Walker-rolling   Wheelchair     Assist Is the patient using a wheelchair?: Yes Type of Wheelchair: Manual      Max wheelchair distance: 150'    Wheelchair 50 feet with 2 turns activity    Assist        Assist Level: Dependent - Patient 0%   Wheelchair 150 feet activity     Assist      Assist Level: Dependent - Patient 0%   Blood pressure (!) 143/53, pulse (!) 53, temperature 97.8 F (36.6 C), temperature source Oral, resp. rate 18, height 5' 8 (1.727 m), weight 81.6 kg, SpO2 93%.  Medical Problem List and Plan: 1. Functional deficits secondary to type II posterior displaced odontoid fracture after a fall             -patient may shower; maintain cervical collar 24/7             -ELOS/Goals: 5-7 days, PT/OT/SLP Mod I -- 8/20 DC date             -Pts wife would like to be notified before any medication changes; per discussion will plan to call every Tues/Thurs and PRN for medical updates    - 8/7: Dr. Burnetta  with ortho spine provided second opinion; recommendations  - Hard cervical collar at all times.  - reevaluate the fracture with x-rays in 4 weeks. - - Recommend he continue to use Celebrex  and muscle relaxer as this is controlling his pain.  - follow-up with me in 4 weeks.  - 8/7: Provided list of PRN medications to patient  - 8/12: Reported  caregiver at home was bathing and dressing him. Currently full SPV most ADLs, limited by SOB/symptomatic bradycardia episodes when mobilized--goals Mod I. SPV bed mobility/transfers, CGA with rollator 150 ft. Goals SPV. Limited by endurance. 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   - Family meeting with patient, wife, nursing admin, hospital admin, Architectural technologist, and medical team  today to discuss patient care -- provided appropriate boundaries with staff (ex. Not following nurses in hallway), reasonable expectations for medical updates, and patient advocacy - provided physician email and office # and discussed physician schedule, NP availability and limitations for medical updates/communication. Wife, team in agreement on minimizing stress with nursing surrounding attending to the patient and his needs. Emphasized OK to seek urgent care if emergency is suspected.    -  Will get nursing to provide call button due to difficulty finding call bell with C collar  2.  Antithrombotics: -DVT/anticoagulation:  Mechanical: Sequential compression devices, below knee Bilateral lower extremities Pharmaceutical: Xarelto              -antiplatelet therapy: N/A  3. Pain Management: Celebrex  (monitor Cr) Robaxin , oxycodone  as needed             - Left knee pain-received cortisone injection by orthopedics  - 8-8: Switch to tramadol  50 mg for moderate pain and oxycodone  2.5 mg for severe pain every 6 hours as needed.  Continue Robaxin , Celebrex .  Nursing to inquire about location of cold packs patient had on prior floor.  8/9: Cold therapy Prn for shoulders, head  8/10 discussed with patient's wife, patient does not ask for tramadol  as needed.  Will schedule twice a day--tolerating  8-11: Left-sided headache.  Patient refusing ice packs.  Per report, tramadol  not effective; changed to as needed.  Patient has Celebrex , oxycodone  available.  Allergic to Tylenol, could  consider Topamax but cautious to oversedate.  Continue current regimen for now.  8/12: D/w wife, requests resumption of scheduled celebrex  200 mg BID understanding may result in recurrent AKI. He has been tolerating once daily up to this point - will trial and check labs Thursday.  - DC tramadol .  - Discussed Fioricet, Journavix as alternative PRNS for headache-- family wishes to hold off at this time.   4. Mood/Behavior/Sleep: LCSW to follow for evaluation and support when available.              -antipsychotic agents: N/A  - 8-8.  Benadryl  25 mg nightly as needed for sleep; per wife, patient uses this at home--has not requested, sleeping well per nursing  - 8/12: 1x PRN 0.125 xanax  today with symptomatic improvement but significant lethargy; maintain minimal dosing only, remove 0.25 PRN option and reduce to BID PRN  5. Neuropsych/cognition: This patient is capable of making decisions on his own behalf.  6. Skin/Wound Care: routine pressure relief measures -[place] J collar in place, monitor for changes in character of skin  -scalp laceration present on admission --stable appearance  7. Fluids/Electrolytes/Nutrition: monitor I/O and repeat labs in a.m.              -  Dysphagia 3 with nectar thick liquids--continue aspiration precautions. Continue SLP   - 8/8: Eating 50 to 100% of meals; on  Free water protocol for hydration.  Albumin 2.9, will encourage p.o. intakes, appreciate nutrition assistance.  8-11: Labs stable.  Albumin improved from 2.9-3.2.  Cleared for thin fluids today.  8/12: patient refused all meals 8/11; 100% breakfast this AM. Will monitor today - if continues to be poor will get nutritional consult  8. HTN: resumed home irbesartan --no BB due to bradycardia  - 8-8: Add as needed hydralazine  10 milligrams every 6 hours for SBP greater than 160, DBP greater than 100.  Increased blood pressure likely due to pain, monitor today may increase irbesartan  if renal function improves  tomorrow.  - BP a little labile, continue to monitor trend and continue current medications  8/11: HTN with therapies, bradycardia - ?contribution of pain/headache vs. Anxiety. Self-resolves once calm.      12/03/2023    9:25 AM 12/03/2023    6:00 AM 12/03/2023    5:09 AM  Vitals with BMI  Weight  179 lbs 14 oz   BMI  27.36   Systolic 143  159  Diastolic 53  82  Pulse 53  60    9.  Chronic diastolic CHF/chronic lower extremity edema: patient's wife requested metolazone  and not Demadex .              -Currently on metolazone  2.5 mg daily, BNP 261-- wife says this CAN NOT BE REMOVED   - Daily weights and volume assessments.  - 8-11: Volume assessments and symptoms without signs of volume overload; chest x-ray 8-9 with improved pulmonary edema.  Weights downtrending while albumin is improving.  Monitor.  8/12: Stable cardiomegaly, pleural effusions on CXR--no external s/s volume overload; trend weights. May need to increase metolazone .   Filed Weights   11/28/23 1659 12/02/23 0500 12/03/23 0600  Weight: 84 kg 79.9 kg 81.6 kg    10. HLD: Continue home atorvastatin  11.  Mild anemia: Continue to monitor CBC recheck labs in a.m.  - 8-11 hemoglobin uptrending 11.4  12.  AKI: Creatinine 1.38 on admission baseline approximately 1.0--trend creatinine. Last Cr 1.52. Recheck tomorrow.  - 8-8: Creatinine 1.5, BUN slightly increased.  Plan for IV fluids very gentle normal saline at 50 cc/h for 10 hours.  Repeat BMP in AM.  Did discuss with wife that if AKI does not improve, may need to adjust scheduled Celebrex  to as needed.  -8/9 Cr a little better at 1.51, change celebrex  to PRN  -8/11 - BUN improving, Cr normalized - continue current regimen, recheck Thursday     13. Obesity             Body mass index is 30.3 kg/m.  14. Bradycardia/atrial fibrillation             -Pt was evaluated by cardiology, no indication for temporary pacing   - Regular rate  -8/9 HR stable currently, nursing  reports occasional bradycardic episodes at night, pt was evaluated by cardiology for this during acute admission  8-11: Reported episode of bradycardia 30s to 50s during therapies today; self resolved, patient asymptomatic.  If recurs again, will get EKG.  8/12: 2x episode SOB and bradycardia 30-50s with therapy this AM; EKG showed ?a flutter with ventricular rate 40s. Re-consulted cardiology for symptomatic bradycardia, added PRN atropine  0.5 mg IV (for use with rapid response if needed); not candidate for pacemake with last evaluation  15. Hx of aortic aneurysm/dissection -f/u  outpatient, BP control   -Blood pressure management as above, has as needed hydralazine   16. Aortic stenosis             -Repeat echo 6-12 months has been recommended  17. L knee pain             -L knee injection completed by ortho 8/1--no current complaints of pain  18.  Right shoulder pain--no complaints on exam 8-11  19.  Dysphagia/secretion management  - Mucinex  600 mg twice daily  - Robitussin DM as needed  - DC Claritin  10 mg daily  -Was placed on Unasyn  --> Augmentin  for suspected aspiration, chest x-ray 8-9 improved without opacity; finish antibiotics at 10 days total  -8-8: Patient managing secretions well on current exam, no current rhonchi or wheezing, no concerns for aspiration.  Monitor oxygen and respiratory status closely.  Can add scopolamine patch if needed, but would hold off due to multiple anticholinergic medications unless absolutely needed.  -8/9  Restart claritin -has allergies hx, discussed increased use of suction  -8/10 nasal congestion no better--Flonase  BID started 8/8  8-11: MBS today with pill dysphagia, otherwise cleared for thin liquids.  Possible prevertebral edema noted at C2, discussed with Dr. Georgina with orthopedics, will start Decadron  4 mg twice daily x 5 days with possible taper to see if this improves dysphagia  8/12: POs better today; monitor as above  20. Occasional SOB and  Anxiety, ?vertigo/fear of falling   -Suspect most likely anxiety but secretions could be contributing. Will check CXR-negative for acute changes, Consider prn anxiety medication    -start xanax  0.25 PRN, discussed with his wife  - 20/10 patient's wife would like to have option for 0.125 dose of Xanax  as needed.  Order adjusted   -Schedule for neuropscyh- next available --discussed patient case with Dr. Corina 8-11  - 8/12: Recurrent, associated with bradycardia and HTN; See #4, #14 above. Also with bed positional changes. May benefit from Buspar trial, family wishes to defer at this time. Offered atarax as alternative to xanax , family defers at this time. Discussed limitations of audiology consult for vertigo; offered COR testing however patient has already seen ENT for OP evaluation for this.    LOS: 5 days A FACE TO FACE EVALUATION WAS PERFORMED  Zachary Underwood 12/03/2023, 10:10 AM

## 2023-12-03 NOTE — Progress Notes (Signed)
 Physical Therapy Session Note  Patient Details  Name: TALEN POSER MRN: [id number] Date of Birth: [birth year]  Today's Date: 12/03/2023 PT Individual Time: 1453-1535 PT Individual Time Calculation (min): 42 min   Short Term Goals: Week 1:  PT Short Term Goal 1 (Week 1): STGs = LTGs  Skilled Therapeutic Interventions/Progress Updates: Pt presents supine in bed asleep but arouses and agreeable to therapy.  Pt transfers sup to sit w/ supervision and scoots to EOB.  Pushes feet into shoes.  O2 sats at 100% and HR of 36 bpm, but increases to 52 bpm w/ concentration on breathing.  Pt transfers sit to stand w/ supervision although requires cues for sequencing and multiple trials before successful.  Pt amb to dayroom w/ rollator and CGA, although 2 seated rest breaks 2/2 episodes of SOB.  HR remains > 50 bpm and O2 98-100%.  Pt amb into BR for continent void in toilet.  Pt required min A for sit to stand transfer from lower height toilet.  Pt required min A for clothing 2/2 around ankles, but w/ bringing up to knees able to perform w/ supervision.  Pt returned to room and to recliner.  Pt remained sitting w/ seat alarm on and all needs in reach, handed off to nurse.     Therapy Documentation Precautions:  Precautions Precautions: Fall, Cervical Required Braces or Orthoses: Cervical Brace Cervical Brace: At all times, Hard collar Restrictions Weight Bearing Restrictions Per Provider Order: No General:   Vital Signs: Therapy Vitals Pulse Rate: 61 Resp: 18 BP: (!) 131/56 Patient Position (if appropriate): Lying Oxygen Therapy SpO2: 96 % O2 Device: Room Air Pain: 0/10     Therapy/Group: Individual Therapy  Torell Minder P Asael Pann 12/03/2023, 3:47 PM

## 2023-12-04 MED ORDER — METOLAZONE 5 MG PO TABS
5.0000 mg | ORAL_TABLET | Freq: Every day | ORAL | Status: DC
  Administered 2023-12-05 – 2023-12-10 (×6): 5 mg via ORAL
  Filled 2023-12-04 (×6): qty 1

## 2023-12-04 MED ORDER — METOLAZONE 2.5 MG PO TABS
2.5000 mg | ORAL_TABLET | Freq: Once | ORAL | Status: AC
  Administered 2023-12-04 (×2): 2.5 mg via ORAL
  Filled 2023-12-04: qty 1

## 2023-12-04 NOTE — Progress Notes (Signed)
 Physical Therapy Session Note  Patient Details  Name: Zachary Underwood MRN: [id number] Date of Birth: 13-Sep-1927  Today's Date: 12/04/2023 PT Individual Time: 9193-9153 PT Individual Time Calculation (min): 40 min   Short Term Goals: Week 1:  PT Short Term Goal 1 (Week 1): STGs = LTGs  Skilled Therapeutic Interventions/Progress Updates:    Pt presents in room alert and motivate to participate with PT. Pt denies pain at this time. Pt SpO2 100% and HR 66 bpm at rest semi reclined in bed. Session focused on therapeutic activities for participating with self care tasks as well as tolerance to upright mobility. Pt completes bed mobility with hospital bed rail supervision. Pt able to don shirt and pants in sitting with supervision, stands to pull pants over hips with CGA for postural stability without UE support. Pt completes stand step transfer without device CGA to recliner for rest break. Pt then ambulates with rollator ~250' with supervision/CGA for clothing management only, pt demonstrating forward flexed trunk and decreased BLE foot clearance, worse on LLE with cues to correct. Pt able to correct minimally during session. Pt returns to room ambulating with rollator, completes oral hygiene at sink in standing with supervision as well as managing combing hair in standing with supervision. Pt returns to seated in recliner with all needs within reach, cal light in place and chair alarm donned and activated at end of session.   Therapy Documentation Precautions:  Precautions Precautions: Fall, Cervical Required Braces or Orthoses: Cervical Brace Cervical Brace: At all times, Hard collar Restrictions Weight Bearing Restrictions Per Provider Order: No  Therapy/Group: Individual Therapy  Reche Ohara PT, DPT 12/04/2023, 8:54 AM

## 2023-12-04 NOTE — Progress Notes (Signed)
 Occupational Therapy Session Note  Patient Details  Name: Zachary Underwood MRN: [id number] Date of Birth: [birth year]  Session 1 Today's Date: 12/04/2023 OT Individual Time: 9154-9054 OT Individual Time Calculation (min): 60 min    Session 2  Today's Date: 12/04/2023 OT Individual Time: 8698-8654 OT Individual Time Calculation (min): 44 min    Short Term Goals: Week 1:  OT Short Term Goal 1 (Week 1): STG= LTG d/t ELOS  Skilled Therapeutic Interventions/Progress Updates:    Session 1 Pt received sitting in the recliner with 1/10 pain in his shoulder/head. He was agreeable to OT session focused on ADL retraining. Functional mobility from recliner into the bathroom with (S) using the rollator. He first attempted BM but was unsuccessful. He then transferred into the shower with min cueing for safety. He completed UB and LB bathing sit <> stand with close (S). He returned to EOB and then supine with cueing/education on c collar pads needing to be changed while supine. He had one episode of SOB/anxiety once flat in bed but was able to recover with use of incentive spirometer. OT changed c collar pads and assisted pt with shaving with max A. Provided edu on needing caregiver assist with these tasks at this time. He returned to EOB and dressing. Shirt donned (S). Pants with (S) sit <> stands. Pt was left sitting up in the recliner with all needs met, chair alarm set, and call bell within reach.   Session 2 Pt received sitting in the recliner with 1/10 pain in his shoulder, agreeable to OT session. He stood from the recliner with (S). He used the rollator and completed 200 ft of functional mobility with shuffling gait but overall (S). Pt completed standing level reciprocal tapping activity with no UE support using 6 in cones for 2x15 repetitions. Activity performed to challenge dynamic standing balance and functional activity tolerance to simulate household threshold management and reduce fall risk. Pt  required CGA overall- improved from earlier this week. Pt required use of rest breaks throughout session for recovery, as well as to support safety and prevent overexertion. During breaks, OT monitored recovery time to assess endurance and response to exertion.  He then completed activity focused on challenging functional activity tolerance and dynamic standing balance- 40 ft of functional mobility with no device, ascending/descending 4 stairs with B hand rails and then another 40 ft to return to the mat. 2 repetitions. Pt completed blocked practice sit <> stand, 2x10 repetitions with no UE support, to challenge generalized strengthening for ADL transfers, as well as increasing functional activity tolerance and cardiorespiratory endurance. Pt required min A when mat was lowered and CGA once it was elevated. He returned to his room and was left sitting up in the recliner with all needs met.   Therapy Documentation Precautions:  Precautions Precautions: Fall, Cervical Required Braces or Orthoses: Cervical Brace Cervical Brace: At all times, Hard collar Restrictions Weight Bearing Restrictions Per Provider Order: No  Therapy/Group: Individual Therapy  Zachary Underwood 12/04/2023, 6:09 AM

## 2023-12-04 NOTE — Progress Notes (Signed)
 Speech Language Pathology Daily Session Note  Patient Details  Name: RODDIE RIEGLER MRN: [id number] Date of Birth: Mar 30, 1928  Today's Date: 12/04/2023 SLP Individual Time: 8954-8856 SLP Individual Time Calculation (min): 58 min  Short Term Goals: Week 1: SLP Short Term Goal 1 (Week 1): STGs = LTGs d/t ELOS  Skilled Therapeutic Interventions:   Patient was seen in am to address dysphagia management and speech intelligibility. Pt easily alerted with slight tactile stimulation though requiring significant time and some additional cueing before achieving full alertness. SLP guided pt in completion of IOPI for lingual strengthening. In the anterior position pt completed 1 set of 20 set at 10 kPa. Pt with significant difficulty achieving success at higher intensity levels though upon evaluation pt able to complete reps at 21 kPa. Suspect lethargy could have contributed to need for decreased intensity. SLP attempted to guided pt in completion of Masako though pt only able to complete 2 swallows before request for discontinuation. SLP introduced compensatory speech intelligibility strategies during session; over articulation and pausing. He was subsequently engaged in a guided conversational exchange where he benefited from min cues though maintained 90% intelligibility. In conversation speech intelligibility reduced slightly to ~85% in known context. He continued to benefit from min A. Pt was left upright in recliner at conclusion of session with call button within reach and chair alarm active. SLP to continue POC.   Pain Pain Assessment Pain Scale: 0-10 Pain Score: 0-No pain  Therapy/Group: Individual Therapy  Joane GORMAN Fuss 12/04/2023, 10:52 AM

## 2023-12-04 NOTE — Progress Notes (Addendum)
 Patient ID: Zachary Underwood, male   DOB: 05-Jul-1927, 89 y.o.   MRN: [id number]  HHPT/OT/SLP/aide referral accepted.   SW sent referral to [facility] SW discharge group- [email or web address].  SW left message for pt assigned [facility] ([phone] ext [phone]).  [phone]- SW called pt wife to inform on above. SW shared updates from [employer] reporting that RW will be private pay since actively renting a wheelchair through insurance as of 07/29/2023. Wife reports she is unaware. SW provided contact information for [employer]. 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 SW sent [facility] referral form. She is unsure on if she will be coming in for family edu.   *SW received email confirmed DME order placed, and referral for [facility] has been entered.   Graeme Jude, MSW, LCSW Office: [phone] Cell: [phone] Fax: [phone]

## 2023-12-04 NOTE — Progress Notes (Signed)
 Speech Language Pathology Daily Session Note  Patient Details  Name: ETHANIEL GARFIELD MRN: [id number] Date of Birth: Jun 05, 1927  Today's Date: 12/04/2023 SLP Individual Time: 1500-1530 SLP Individual Time Calculation (min): 30 min  Short Term Goals: Week 1: SLP Short Term Goal 1 (Week 1): STGs = LTGs d/t ELOS  Skilled Therapeutic Interventions:   Pt greeted in his room. He was asleep upon SLP arrival, but woke easily w/ verbal greeting. Tx tasks targeting cognition and speech production. He was able to recall masako maneuver and IOPI introduced the prev day. Reported completing them in AM tx session this date, however, other SLP noted limited success w/ this d/t fatigue. SLP encouraged pt to complete masako maneuver frequently outside of scheduled ST tx sessions to target improved tongue base retraction and subsequent epiglottic inversion. He verbalized understanding of this. Repetitive phoneme tasks introduced w/ velar sounds. He benefited from modA cues to utilize over articulation and pausing to improve articulatory precision to 80% intelligibility. SLP and pt observed reduced respiratory support at the end of utterances. He would likely benefit from introduction of EMST in upcoming tx sessions. Of note, pt provided w/ some of his most challenging phonemes for the task. Cul de sac resonance still apparent, though reduced the last 2 days as compared to time of eval. Improved secretion management noted as well. Upon SLP departure, he was left in his recliner w/ the chair alarm set and call light within reach. Recommend cont ST per POC.   Pain  None reported  Therapy/Group: Individual Therapy  [name] 12/04/2023, 6:50 PM

## 2023-12-04 NOTE — Progress Notes (Addendum)
 PROGRESS NOTE   Subjective/Complaints:  No events overnight. No complaints this AM. Does have episode of shortness of breath with laying flat for shaving, which is consistent with his prior episodes; resolves after 1 to 2 puffs with incentive spirometer.  EP evaluated yesterday; determined pt is compensating chronotropically and recommend no urgent intervention at this time. Continue to monitor for bradycardic syncope.   BP 130-160s; other vitals stable  Ate 100% breakfast and 70% lunch yesterday; 100% breakfast today  LBM 8/11, medium  ROS: Denies fevers, chills, N/V, abdominal pain, constipation, diarrhea, chest pain, new weakness or paraesthesias.   + Dysphagia/dysarthria--ongoing + Hard of hearing--stable + Intermittent shortness of breath and anxiety- ongoing, with ambulation, had reclining, and elevating bed + Left sided headache --resolved  Objective:   DG CHEST PORT 1 VIEW Result Date: 12/03/2023 CLINICAL DATA:  Shortness of breath. EXAM: PORTABLE CHEST 1 VIEW COMPARISON:  Radiograph 11/30/2023, CT 11/26/2023 FINDINGS: Prior median sternotomy. Stable cardiomegaly. Unchanged mediastinal contours. Small pleural effusions persist. Stable pulmonary vasculature. No acute airspace disease or pneumothorax. IMPRESSION: Stable cardiomegaly. Small pleural effusions. Electronically Signed   By: Andrea Gasman M.D.   On: 12/03/2023 10:07   DG Swallowing Func-Speech Pathology Result Date: 12/02/2023 Table formatting from the original result was not included. Modified Barium Swallow Study Patient Details Name: Zachary Underwood MRN: [id number] Date of Birth: 07-16-1927 Today's Date: 12/02/2023 HPI/PMH: HPI: Zachary Underwood is an active, still-working 89 y.o. male who presented to the ED for evaluation after he fell and hit his head and neck. CT head and maxillofacial negative for acute changes. CT cervical spine: mildly displaced fractures  through the posterior arch of C1 and nondisplaced fracture through the midline anterior arch of C1. MR cervical spine: Displaced type 2 odontoid fracture with significant posterior dislocation of the dens relative to the body of C2, osteophyte at C5-6. MBS 11/25/23 -dysphagia 3, nectar liquids with precautions recommended. PMH: persistent atrial fibrillation on Xarelto , CVA, moderate aortic stenosis, s/p surgical repair of ascending thoracic aorta, HTN, HLD, BPH, chronic lower extremity edema. Clinical Impression: Clinical Impression: Pt presents w/ improved oropharyngeal dysphagia as compared to 8/4. However, mild dysphagia remains overall. Trace oral residue and some posterior lingual weakness remain. Pharyngeally, he demonstrates improved airway protection. Despite this; reduced tongue base retraction, hyolaryngeal excursion, and pharyngeal constriction continue to negatively impact swallow efficiency. Subsequently, mild scattered pharyngeal residue remains after the swallow. Liquid wash was effective to reduce phayrngeal residue after puree and solid trials. Penetration (PAS 2 w/ nectar and PAS 4 w/ thin) noted w/ thin and mildly thick consistencies, however this is well WFL for pt's age. The only instance of aspiration noted (PAS 7) occured during the pill trial. Similar to prev MBSS, the barium tablet became lodged in his valleculae. Cervical collar negatively impacted his ROM, however, a chin tuck, additoinal liquid washes, and pureed consistencies were ineffective to clear the tablet. It was only cleared when pt eventually coughed up and expectorated said tablet. Of note, some potential prevertebral edema noted @ level of C2, however, difficult to differentiate d/t cervical lordosis. Hypertrophy of the cricopharyngeus noted as well. Anticipate recent cervical fx, potential edema, and deconditioning exacerbated  baseline mild structural abnormalities and presbyphagia that patient was able to compensate for prior  to this admission. Recommend continued Dys 3 textures and meds crushed in puree. Upgrade to thin liquids. Intermittent supervision during meals. Pt should also continue w/ previously recommended effortful swallow and double swallow to assist w/ pharyngeal clearance. Factors that may increase risk of adverse event in presence of aspiration [name] 2021): Factors that may increase risk of adverse event in presence of aspiration [name] 2021): Frail or deconditioned Recommendations/Plan: Swallowing Evaluation Recommendations Swallowing Evaluation Recommendations Recommendations: PO diet PO Diet Recommendation: Dysphagia 3 (Mechanical soft); Thin liquids (Level 0) Liquid Administration via: Straw; Cup Medication Administration: Crushed with puree Supervision: Patient able to self-feed; Intermittent supervision/cueing for swallowing strategies Swallowing strategies  : Slow rate; Small bites/sips; effortful swallow; Multiple dry swallows after each bite/sip; Follow solids with liquids; Clear throat intermittently Postural changes: Stay upright 30-60 min after meals; Position pt fully upright for meals Oral care recommendations: Oral care BID (2x/day) Caregiver Recommendations: Have oral suction available Treatment Plan Treatment Plan Treatment recommendations: Therapy as outlined in treatment plan below Follow-up recommendations: Outpatient SLP Functional status assessment: Patient has had a recent decline in their functional status and demonstrates the ability to make significant improvements in function in a reasonable and predictable amount of time. Treatment frequency: Min 5x/week Treatment duration: 2 weeks Interventions: Oropharyngeal exercises; Patient/family education; Diet toleration management by SLP; Compensatory techniques; Aspiration precaution training; Respiratory muscle strength training Recommendations Recommendations for follow up therapy are one component of a multi-disciplinary  discharge planning process, led by the attending physician.  Recommendations may be updated based on patient status, additional functional criteria and insurance authorization. Assessment: Orofacial Exam: Orofacial Exam Oral Cavity: Oral Hygiene: WFL Oral Cavity - Dentition: Adequate natural dentition Orofacial Anatomy: WFL Oral Motor/Sensory Function: WFL Anatomy: Anatomy: Prominent cricopharyngeus; Other (Comment); Suspected cervical osteophytes (cervical lordosis) Boluses Administered: Boluses Administered Boluses Administered: Thin liquids (Level 0); Mildly thick liquids (Level 2, nectar thick); Puree; Solid  Oral Impairment Domain: Oral Impairment Domain Lip Closure: No labial escape Tongue control during bolus hold: Posterior escape of less than half of bolus Bolus preparation/mastication: Timely and efficient chewing and mashing Bolus transport/lingual motion: Repetitive/disorganized tongue motion Oral residue: Trace residue lining oral structures Location of oral residue : Tongue Initiation of pharyngeal swallow : Posterior laryngeal surface of the epiglottis; Pyriform sinuses  Pharyngeal Impairment Domain: Pharyngeal Impairment Domain Soft palate elevation: No bolus between soft palate (SP)/pharyngeal wall (PW) Laryngeal elevation: Partial superior movement of thyroid cartilage/partial approximation of arytenoids to epiglottic petiole Anterior hyoid excursion: Partial anterior movement Epiglottic movement: Partial inversion Laryngeal vestibule closure: Incomplete, narrow column air/contrast in laryngeal vestibule Pharyngeal stripping wave : Present - diminished Pharyngeal contraction (A/P view only): N/A Pharyngoesophageal segment opening: Partial distention/partial duration, partial obstruction of flow Tongue base retraction: Narrow column of contrast or air between tongue base and PPW Pharyngeal residue: Collection of residue within or on pharyngeal structures Location of pharyngeal residue: Valleculae;  Pharyngeal wall; Tongue base; Pyriform sinuses; Diffuse (>3 areas)  Esophageal Impairment Domain: No data recorded Pill: Pill Consistency administered: Thin liquids (Level 0); Puree Thin liquids (Level 0): Impaired (see clinical impressions) Puree: Impaired (see clinical impressions) Penetration/Aspiration Scale Score: Penetration/Aspiration Scale Score 1.  Material does not enter airway: Puree; Solid 2.  Material enters airway, remains ABOVE vocal cords then ejected out: Mildly thick liquids (Level 2, nectar thick) 4.  Material enters airway, CONTACTS cords then ejected out: Thin liquids (Level 0)  7.  Material enters airway, passes BELOW cords and not ejected out despite cough attempt by patient: Pill; Thin liquids (Level 0) (thin liquids during pill trial only) Compensatory Strategies: Compensatory Strategies Compensatory strategies: Yes Straw: Effective Effective Straw: Thin liquid (Level 0) Multiple swallows: Effective Effective Multiple Swallows: Thin liquid (Level 0); Mildly thick liquid (Level 2, nectar thick); Puree; Solid Chin tuck: Ineffective Ineffective Chin Tuck: Pill Liquid wash: Effective Effective Liquid Wash: Puree; Solid   General Information: Caregiver present: No  Diet Prior to this Study: Dysphagia 3 (mechanical soft); Mildly thick liquids (Level 2, nectar thick)   Temperature : Normal   Respiratory Status: WFL   Supplemental O2: None (Room air)   History of Recent Intubation: No  Behavior/Cognition: Alert; Cooperative Self-Feeding Abilities: Able to self-feed Baseline vocal quality/speech: Abnormal resonance Volitional Cough: Able to elicit Volitional Swallow: Able to elicit Exam Limitations: No limitations Goal Planning: Prognosis for improved oropharyngeal function: Good Barriers to Reach Goals: Severity of deficits; Other (Comment) Barriers/Prognosis Comment: deconditioned state Patient/Family Stated Goal: improve dysphagia Consulted and agree with results and recommendations: Patient Pain:  Pain Assessment Pain Assessment: Faces Faces Pain Scale: 6 Pain Location: headache - L side Pain Intervention(s): Monitored during session; Premedicated before session End of Session: Start Time:No data recorded Stop Time: No data recorded Time Calculation:No data recorded Charges: No data recorded SLP visit diagnosis: SLP Visit Diagnosis: Dysphagia, oropharyngeal phase (R13.12) Past Medical History: Past Medical History: Diagnosis Date  Arthritis   Cardiac arrhythmia   Hypertension   Ruptured aortic aneurysm (HCC)   Stroke (HCC) 08/23/2021 Past Surgical History: Past Surgical History: Procedure Laterality Date  open heart surgery    ruptured aortic aneurysm repair   [facility] 12/02/2023, 12:10 PM  Recent Labs    12/02/23 0510  WBC 8.7  HGB 11.4*  HCT 36.1*  PLT 191   Recent Labs    12/02/23 0510  NA 142  K 4.1  CL 110  CO2 23  GLUCOSE 90  BUN 34*  CREATININE 1.20  CALCIUM  9.5    Intake/Output Summary (Last 24 hours) at 12/04/2023 0854 Last data filed at 12/04/2023 0816 Gross per 24 hour  Intake 340 ml  Output 200 ml  Net 140 ml        Physical Exam: Vital Signs Blood pressure (!) 148/67, pulse 62, temperature 97.6 F (36.4 C), temperature source Oral, resp. rate 16, height 5' 8 (1.727 m), weight 82.3 kg, SpO2 98%.    General: No apparent distress.  Laying in bed; getting shaved by OT. HEENT: EOMI.  PERRLA.  No nystagmus.  Head is normocephalic. Neck: Supple without JVD or lymphadenopathy.  C-collar temporarily removed for shaving, no apparent edema or bruising on visible skin. Heart: Bradycardic rate 50s, irregular rhythm.  + systolic ejection murmur Chest: CTA bilaterally without wheezes, rales, or rhonchi; no distress.     Abdomen: Soft, non-tender, non-distended, bowel sounds positive. Extremities: No clubbing, cyanosis. Pulses are 2+.  Left lower extremity 1+ pitting edema, none right lower extremity. Psych: Pt's affect is appropriate. Pt is cooperative.  Skin:  Clean and intact without signs of breakdown  + L shoulder - Chronic Lipoma--stable  + Right anterior scalp laceration --stable--under mepilex dressing, c/d  Neuro:   Mental Status: Awake, alert; oriented to self, place, and time. No obvious cognitive deficits.   Speech/Languate: Intact, mild dysarthria.  100% intelligible. CRANIAL NERVES: 2 through 12 grossly intact   MOTOR: Moving all 4 limbs antigravity, against resistance in bed.  Prior exams: RUE: 3/5 Deltoid, 4/5 Biceps, 4/5 Triceps,4/5 Grip LUE: 3/5 Deltoid, 4/5 Biceps, 4/5 Triceps, 4/5 Grip RLE: HF 4/5, KE 4/5, ADF 4/5, APF 4/5 LLE: HF 4/5, KE 4/5, ADF 4/5, APF 4/5     SENSORY: Normal to touch all 4 extremities.     Coordination: No ataxia, no tremor   MSK: Right shoulder abduction limited to less than 90 degrees; crepitus on passive range of motion--not reexamined 8-13  Assessment/Plan: 1. Functional deficits which require 3+ hours per day of interdisciplinary therapy in a comprehensive inpatient rehab setting. Physiatrist is providing close team supervision and 24 hour management of active medical problems listed below. Physiatrist and rehab team continue to assess barriers to discharge/monitor patient progress toward functional and medical goals  Care Tool:  Bathing    Body parts bathed by patient: Right arm, Left arm, Abdomen, Chest, Front perineal area, Buttocks, Right upper leg, Left upper leg, Face   Body parts bathed by helper: Right lower leg, Left lower leg     Bathing assist Assist Level: Minimal Assistance - Patient > 75%     Upper Body Dressing/Undressing Upper body dressing   What is the patient wearing?: Hospital gown only    Upper body assist Assist Level: Minimal Assistance - Patient > 75%    Lower Body Dressing/Undressing Lower body dressing    Lower body dressing activity did not occur: Refused       Lower body assist       Toileting Toileting    Toileting assist Assist for  toileting: Minimal Assistance - Patient > 75%     Transfers Chair/bed transfer  Transfers assist     Chair/bed transfer assist level: Minimal Assistance - Patient > 75%     Locomotion Ambulation   Ambulation assist      Assist level: Contact Guard/Touching assist Assistive device: Rollator Max distance: 150   Walk 10 feet activity   Assist     Assist level: Contact Guard/Touching assist Assistive device: Rollator   Walk 50 feet activity   Assist    Assist level: Contact Guard/Touching assist Assistive device: Rollator    Walk 150 feet activity   Assist    Assist level: Contact Guard/Touching assist Assistive device: Rollator    Walk 10 feet on uneven surface  activity   Assist     Assist level: Minimal Assistance - Patient > 75% Assistive device: Walker-rolling   Wheelchair     Assist Is the patient using a wheelchair?: Yes Type of Wheelchair: Manual      Max wheelchair distance: 150'    Wheelchair 50 feet with 2 turns activity    Assist        Assist Level: Dependent - Patient 0%   Wheelchair 150 feet activity     Assist      Assist Level: Dependent - Patient 0%   Blood pressure (!) 148/67, pulse 62, temperature 97.6 F (36.4 C), temperature source Oral, resp. rate 16, height 5' 8 (1.727 m), weight 82.3 kg, SpO2 98%.  Medical Problem List and Plan: 1. Functional deficits secondary to type II posterior displaced odontoid fracture after a fall             -patient may shower; maintain cervical collar 24/7             -ELOS/Goals: 5-7 days, PT/OT/SLP Mod I -- 8/20 DC date             -Pts wife would like to be notified  before any medication changes; per discussion will plan to call every Tues/Thurs and PRN for medical updates    - 8/7: Dr. Burnetta with ortho spine provided second opinion; recommendations  - Hard cervical collar at all times.  - reevaluate the fracture with x-rays in 4 weeks. - - Recommend he  continue to use Celebrex  and muscle relaxer as this is controlling his pain.  - follow-up with me in 4 weeks.  - 8/7: Provided list of PRN medications to patient  - 8/12: Reported caregiver at home was bathing and dressing him. Currently full SPV most ADLs, limited by SOB/symptomatic bradycardia episodes when mobilized--goals Mod I. SPV bed mobility/transfers, CGA with rollator 150 ft. Goals SPV. Limited by endurance. 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   - Family meeting with patient, wife, nursing admin, hospital admin, Architectural technologist, and medical team  today to discuss patient care -- provided appropriate boundaries with staff (ex. Not following nurses in hallway), reasonable expectations for medical updates, and patient advocacy - provided physician email and office # and discussed physician schedule, NP availability and limitations for medical updates/communication. Wife, team in agreement on minimizing stress with nursing surrounding attending to the patient and his needs. Emphasized OK to seek urgent care if emergency is suspected.    -  Will get nursing to provide call button due to difficulty finding call bell with C collar  2.  Antithrombotics: -DVT/anticoagulation:  Mechanical: Sequential compression devices, below knee Bilateral lower extremities Pharmaceutical: Xarelto              -antiplatelet therapy: N/A  3. Pain Management: Celebrex  (monitor Cr) Robaxin , oxycodone  as needed             - Left knee pain-received cortisone injection by orthopedics  - 8-8: Switch to tramadol  50 mg for moderate pain and oxycodone  2.5 mg for severe pain every 6 hours as needed.  Continue Robaxin , Celebrex .  Nursing to inquire about location of cold packs patient had on prior floor.  8/9: Cold therapy Prn for shoulders, head  8/10 discussed with patient's wife, patient does not ask for tramadol  as needed.  Will schedule twice a day--tolerating  8-11:  Left-sided headache.  Patient refusing ice packs.  Per report, tramadol  not effective; changed to as needed.  Patient has Celebrex , oxycodone  available.  Allergic to Tylenol, could consider Topamax but cautious to oversedate.  Continue current regimen for now.  8/12: D/w wife, requests resumption of scheduled celebrex  200 mg BID understanding may result in recurrent AKI. He has been tolerating once daily up to this point - will trial and check labs Thursday.  - DC tramadol .  - Discussed Fioricet, Journavix as alternative PRNS for headache-- family wishes to hold off at this time.   8/13: minimal pain on this regimen; denies any headaches  4. Mood/Behavior/Sleep: LCSW to follow for evaluation and support when available.              -antipsychotic agents: N/A  - 8-8.  Benadryl  25 mg nightly as needed for sleep; per wife, patient uses this at home--has not requested, sleeping well per nursing  - 8/12: 1x PRN 0.125 xanax  today with symptomatic improvement but significant lethargy; maintain minimal dosing only, remove 0.25 PRN option and reduce to BID PRN  5. Neuropsych/cognition: This patient is capable of making decisions on his own behalf.  6. Skin/Wound Care: routine pressure relief measures -[place] J collar in place, monitor for changes  in character of skin  -scalp laceration present on admission --stable appearance  7. Fluids/Electrolytes/Nutrition: monitor I/O and repeat labs in a.m.              - Dysphagia 3 with nectar thick liquids--continue aspiration precautions. Continue SLP   - 8/8: Eating 50 to 100% of meals; on  Free water protocol for hydration.  Albumin 2.9, will encourage p.o. intakes, appreciate nutrition assistance.  8-11: Labs stable.  Albumin improved from 2.9-3.2.  Cleared for thin fluids today.  8/12: patient refused all meals 8/11; 100% breakfast this AM. Will monitor today - if continues to be poor will get nutritional consult  8-13: P.o. intakes picked up , okay to  monitor for now  8. HTN: resumed home irbesartan --no BB due to bradycardia  - 8-8: Add as needed hydralazine  10 milligrams every 6 hours for SBP greater than 160, DBP greater than 100.  Increased blood pressure likely due to pain, monitor today may increase irbesartan  if renal function improves tomorrow.  - BP a little labile, continue to monitor trend and continue current medications  8/11: HTN with therapies, bradycardia - ?contribution of pain/headache vs. Anxiety. Self-resolves once calm.   8/13: HTN 140-160s; increase metolazone  to 5 mg daily    12/04/2023    6:18 AM 12/03/2023    8:17 PM 12/03/2023    2:45 PM  Vitals with BMI  Weight 181 lbs 7 oz    BMI 27.59    Systolic 148 162 868  Diastolic 67 65 56  Pulse 62  61    9.  Chronic diastolic CHF/chronic lower extremity edema: patient's wife requested metolazone  and not Demadex .              -Currently on metolazone  2.5 mg daily, BNP 261-- wife says this CAN NOT BE REMOVED   - Daily weights and volume assessments.  - 8-11: Volume assessments and symptoms without signs of volume overload; chest x-ray 8-9 with improved pulmonary edema.  Weights downtrending while albumin is improving.  Monitor.  8/12: Stable cardiomegaly, pleural effusions on CXR--no external s/s volume overload; trend weights. May need to increase metolazone .   8/13: Weights uptrending, BP elevated - increase metolazone  to 5 mg daily; already discussed as potential with patient's wife, informed nursing to notify family when they get in.  Filed Weights   12/02/23 0500 12/03/23 0600 12/04/23 0618  Weight: 79.9 kg 81.6 kg 82.3 kg    10. HLD: Continue home atorvastatin  11.  Mild anemia: Continue to monitor CBC recheck labs in a.m.  - 8-11 hemoglobin uptrending 11.4--recheck Thursday  12.  AKI: Creatinine 1.38 on admission baseline approximately 1.0--trend creatinine. Last Cr 1.52. Recheck tomorrow.  - 8-8: Creatinine 1.5, BUN slightly increased.  Plan for IV fluids  very gentle normal saline at 50 cc/h for 10 hours.  Repeat BMP in AM.  Did discuss with wife that if AKI does not improve, may need to adjust scheduled Celebrex  to as needed.  -8/9 Cr a little better at 1.51, change celebrex  to PRN  -8/11 - BUN improving, Cr normalized - continue current regimen, recheck Thursday     13. Obesity             Body mass index is 30.3 kg/m.  14. Bradycardia/atrial fibrillation             -Pt was evaluated by cardiology, no indication for temporary pacing   - Regular rate  -8/9 HR stable currently, nursing reports occasional bradycardic episodes  at night, pt was evaluated by cardiology for this during acute admission  8-11: Reported episode of bradycardia 30s to 50s during therapies today; self resolved, patient asymptomatic.  If recurs again, will get EKG.  8/12: 2x episode SOB and bradycardia 30-50s with therapy this AM; EKG showed ?a flutter with ventricular rate 40s. Re-consulted cardiology for symptomatic bradycardia, added PRN atropine  0.5 mg IV (for use with rapid response if needed); not candidate for pacemake with last evaluation 8/13: Per EP: If he has episodes of syncope related to bradycardia, pacemaker implant would be reasonable. As he does have a c-collar in place, we will hold off for now. He can follow-up with general cardiology.   15. Hx of aortic aneurysm/dissection -f/u outpatient, BP control   -Blood pressure management as above, has as needed hydralazine   16. Aortic stenosis             -Repeat echo 6-12 months has been recommended  17. L knee pain             -L knee injection completed by ortho 8/1--no current complaints of pain  18.  Right shoulder pain--no complaints on exam 8-11  19.  Dysphagia/secretion management  - Mucinex  600 mg twice daily  - Robitussin DM as needed  - DC Claritin  10 mg daily  -Was placed on Unasyn  --> Augmentin  for suspected aspiration, chest x-ray 8-9 improved without opacity; finish antibiotics at 10  days total  -8-8: Patient managing secretions well on current exam, no current rhonchi or wheezing, no concerns for aspiration.  Monitor oxygen and respiratory status closely.  Can add scopolamine patch if needed, but would hold off due to multiple anticholinergic medications unless absolutely needed.  -8/9  Restart claritin -has allergies hx, discussed increased use of suction  -8/10 nasal congestion no better--Flonase  BID started 8/8  8-11: MBS today with pill dysphagia, otherwise cleared for thin liquids.  Possible prevertebral edema noted at C2, discussed with Dr. Georgina with orthopedics, will start Decadron  4 mg twice daily x 5 days with possible taper to see if this improves dysphagia  8/12: POs better today; monitor as above  20. Occasional SOB and Anxiety, ?vertigo/fear of falling   -Suspect most likely anxiety but secretions could be contributing. Will check CXR-negative for acute changes, Consider prn anxiety medication    -start xanax  0.25 PRN, discussed with his wife  - 72/10 patient's wife would like to have option for 0.125 dose of Xanax  as needed.  Order adjusted   -Schedule for neuropscyh- next available --discussed patient case with Dr. Corina 8-11  - 8/12: Recurrent, associated with bradycardia and HTN; See #4, #14 above. Also with bed positional changes. May benefit from Buspar trial, family wishes to defer at this time. Offered atarax as alternative to xanax , family defers at this time. Discussed limitations of audiology consult for vertigo; offered COR testing however patient has already seen ENT for OP evaluation for this.   8-13: No intervention per EP, still occurring primarily with head going back; resolves quickly with use of incentive spirometer   LOS: 6 days A FACE TO FACE EVALUATION WAS PERFORMED  Joesph JAYSON Likes 12/04/2023, 8:54 AM

## 2023-12-05 LAB — BASIC METABOLIC PANEL WITH GFR
Anion gap: 11 (ref 5–15)
BUN: 35 mg/dL — ABNORMAL HIGH (ref 8–23)
CO2: 19 mmol/L — ABNORMAL LOW (ref 22–32)
Calcium: 9.6 mg/dL (ref 8.9–10.3)
Chloride: 107 mmol/L (ref 98–111)
Creatinine, Ser: 1.21 mg/dL (ref 0.61–1.24)
GFR, Estimated: 55 mL/min — ABNORMAL LOW (ref >60)
Glucose, Bld: 99 mg/dL (ref 70–99)
Potassium: 4.1 mmol/L (ref 3.5–5.1)
Sodium: 137 mmol/L (ref 135–145)

## 2023-12-05 LAB — CBC
HCT: 37.6 % — ABNORMAL LOW (ref 39.0–52.0)
Hemoglobin: 12 g/dL — ABNORMAL LOW (ref 13.0–17.0)
MCH: 30.2 pg (ref 26.0–34.0)
MCHC: 31.9 g/dL (ref 30.0–36.0)
MCV: 94.5 fL (ref 80.0–100.0)
Platelets: 183 K/uL (ref 150–400)
RBC: 3.98 MIL/uL — ABNORMAL LOW (ref 4.22–5.81)
RDW: 14.3 % (ref 11.5–15.5)
WBC: 9.5 K/uL (ref 4.0–10.5)
nRBC: 0 % (ref 0.0–0.2)

## 2023-12-05 NOTE — Progress Notes (Addendum)
 PROGRESS NOTE   Subjective/Complaints:  No events overnight.  Vitals are stable, blood pressure and heart rate mostly within normal limits.  No further reported episodes of bradycardia.  Did require as needed Xanax  today for anxiety; reported for anxiety episodes which he did not tell staff about.  Tolerating current dose well, without lethargy.  He is complaining of some itchiness around his collar today.  ROS: Denies fevers, chills, N/V, abdominal pain, constipation, diarrhea, chest pain, new weakness or paraesthesias.   + Dysphagia/dysarthria--improving + Intermittent shortness of breath and anxiety- ongoing, with reclining, and elevating bed + Neck itching  Objective:   No results found.  Recent Labs    12/05/23 0453  WBC 9.5  HGB 12.0*  HCT 37.6*  PLT 183   Recent Labs    12/05/23 0453  NA 137  K 4.1  CL 107  CO2 19*  GLUCOSE 99  BUN 35*  CREATININE 1.21  CALCIUM  9.6    Intake/Output Summary (Last 24 hours) at 12/05/2023 0947 Last data filed at 12/05/2023 0800 Gross per 24 hour  Intake 360 ml  Output 250 ml  Net 110 ml        Physical Exam: Vital Signs Blood pressure (!) 159/55, pulse (!) 49, temperature 97.9 F (36.6 C), resp. rate 19, height 5' 8 (1.727 m), weight 82.8 kg, SpO2 99%.    General: No apparent distress.  Ambulating with rolling walker with CGA. HEENT: EOMI.  PERRLA.  No nystagmus.  Head is normocephalic. Neck: Supple without JVD or lymphadenopathy.  C-collar intact.   Heart:   rate 60s, irregular rhythm.  + systolic ejection murmur Chest: CTA bilaterally without wheezes, rales, or rhonchi; no distress.     Abdomen: Soft, non-tender, non-distended, bowel sounds positive. Extremities:    trace+ bilateral lower extremity edema Psych: Pt's affect is appropriate. Pt is cooperative.  Skin: Clean and intact without signs of breakdown  + L shoulder - Chronic Lipoma--stable  +  Right anterior scalp laceration --stable--under mepilex dressing, c/d No apparent rash, erythema around itching site around chin, collar  Neuro:   Mental Status: Awake, alert; oriented to self, place, and time. No obvious cognitive deficits.   Speech/Languate: Intact, mild dysarthria.  100% intelligible. CRANIAL NERVES: 2 through 12 grossly intact   MOTOR: Moving all 4 limbs antigravity, against resistance in bed.  Strength: 4+/5 throughout bilateral upper and lower extremities     SENSORY: Normal to touch all 4 extremities.     Coordination: No ataxia, no tremor  Gait: Ambulates with a rolling walker at supervision level, stable stance and stride   MSK: Right shoulder abduction limited to less than 90 degrees--unchanged  Assessment/Plan: 1. Functional deficits which require 3+ hours per day of interdisciplinary therapy in a comprehensive inpatient rehab setting. Physiatrist is providing close team supervision and 24 hour management of active medical problems listed below. Physiatrist and rehab team continue to assess barriers to discharge/monitor patient progress toward functional and medical goals  Care Tool:  Bathing    Body parts bathed by patient: Right arm, Left arm, Abdomen, Chest, Front perineal area, Buttocks, Right upper leg, Left upper leg, Face, Right lower leg, Left lower leg  Body parts bathed by helper: Right lower leg, Left lower leg     Bathing assist Assist Level: Supervision/Verbal cueing     Upper Body Dressing/Undressing Upper body dressing   What is the patient wearing?: Pull over shirt    Upper body assist Assist Level: Supervision/Verbal cueing    Lower Body Dressing/Undressing Lower body dressing    Lower body dressing activity did not occur: Refused What is the patient wearing?: Pants     Lower body assist Assist for lower body dressing: Supervision/Verbal cueing     Toileting Toileting    Toileting assist Assist for toileting:  Supervision/Verbal cueing     Transfers Chair/bed transfer  Transfers assist     Chair/bed transfer assist level: Supervision/Verbal cueing     Locomotion Ambulation   Ambulation assist      Assist level: Contact Guard/Touching assist Assistive device: Rollator Max distance: 150   Walk 10 feet activity   Assist     Assist level: Contact Guard/Touching assist Assistive device: Rollator   Walk 50 feet activity   Assist    Assist level: Contact Guard/Touching assist Assistive device: Rollator    Walk 150 feet activity   Assist    Assist level: Contact Guard/Touching assist Assistive device: Rollator    Walk 10 feet on uneven surface  activity   Assist     Assist level: Minimal Assistance - Patient > 75% Assistive device: Walker-rolling   Wheelchair     Assist Is the patient using a wheelchair?: Yes Type of Wheelchair: Manual      Max wheelchair distance: 150'    Wheelchair 50 feet with 2 turns activity    Assist        Assist Level: Dependent - Patient 0%   Wheelchair 150 feet activity     Assist      Assist Level: Dependent - Patient 0%   Blood pressure (!) 159/55, pulse (!) 49, temperature 97.9 F (36.6 C), resp. rate 19, height 5' 8 (1.727 m), weight 82.8 kg, SpO2 99%.  Medical Problem List and Plan: 1. Functional deficits secondary to type II posterior displaced odontoid fracture after a fall             -patient may shower; maintain cervical collar 24/7             -ELOS/Goals: 5-7 days, PT/OT/SLP Mod I -- 8/20 DC date             -Pts wife would like to be notified before any medication changes    - 8/7: Dr. Burnetta with ortho spine provided second opinion; recommendations  - Hard cervical collar at all times.  - reevaluate the fracture with x-rays in 4 weeks. - - Recommend he continue to use Celebrex  and muscle relaxer as this is controlling his pain.  - follow-up with me in 4 weeks.  - 8/7: Provided list  of PRN medications to patient  - 8/12: Reported caregiver at home was bathing and dressing him. Currently full SPV most ADLs, limited by SOB/symptomatic bradycardia episodes when mobilized--goals Mod I. SPV bed mobility/transfers, CGA with rollator 150 ft. Goals SPV. Limited by endurance. 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   - Family meeting with patient, wife, nursing admin, hospital admin, Architectural technologist, and medical team  today to discuss patient care -- provided appropriate boundaries with staff (ex. Not following nurses in hallway), reasonable expectations for medical updates, and patient advocacy - provided physician  email and office # and discussed physician schedule, NP availability and limitations for medical updates/communication. Wife, team in agreement on minimizing stress with nursing surrounding attending to the patient and his needs. Emphasized OK to seek urgent care if emergency is suspected.    -  Will get nursing to provide call button due to difficulty finding call bell with C collar   - Due to anxiety and fear of falling exacerbated by posterior lean, along with ongoing surgical precautions with high risk of odontoid displacement with malpositioning, feel patient would benefit from a hospital bed to facilitate head elevation and improve bed mobility and independence.  2.  Antithrombotics: -DVT/anticoagulation:  Mechanical: Sequential compression devices, below knee Bilateral lower extremities Pharmaceutical: Xarelto              -antiplatelet therapy: N/A  3. Pain Management: Celebrex  (monitor Cr) Robaxin , oxycodone  as needed             - Left knee pain-received cortisone injection by orthopedics  - 8-8: Switch to tramadol  50 mg for moderate pain and oxycodone  2.5 mg for severe pain every 6 hours as needed.  Continue Robaxin , Celebrex .  Nursing to inquire about location of cold packs patient had on prior floor.  8/9: Cold therapy  Prn for shoulders, head  8/10 discussed with patient's wife, patient does not ask for tramadol  as needed.  Will schedule twice a day--tolerating  8-11: Left-sided headache.  Patient refusing ice packs.  Per report, tramadol  not effective; changed to as needed.  Patient has Celebrex , oxycodone  available.  Allergic to Tylenol, could consider Topamax but cautious to oversedate.  Continue current regimen for now.  8/12: D/w wife, requests resumption of scheduled celebrex  200 mg BID understanding may result in recurrent AKI. He has been tolerating once daily up to this point - will trial and check labs Thursday.  - DC tramadol .  - Discussed Fioricet, Journavix as alternative PRNS for headache-- family wishes to hold off at this time.   8/13: minimal pain on this regimen; denies any headaches 8-14: Renal function is tolerating Celebrex ; continue  4. Mood/Behavior/Sleep: LCSW to follow for evaluation and support when available.              -antipsychotic agents: N/A  - 8-8.  Benadryl  25 mg nightly as needed for sleep; per wife, patient uses this at home--has not requested, sleeping well per nursing  - 8/12: 1x PRN 0.125 xanax  today with symptomatic improvement but significant lethargy; maintain minimal dosing only, remove 0.25 PRN option and reduce to BID PRN  8/14: Not requiring Xanax  currently; will monitor and potentially DC before the weekend  8-15: 4 episodes of anxiety today, family requesting more frequent Xanax  since he is tolerating it well; emphasized this will need to be picked up by PCP at discharge if he is can to continue, changed to every 2 hours as needed  5. Neuropsych/cognition: This patient is capable of making decisions on his own behalf.  6. Skin/Wound Care: routine pressure relief measures -[place] J collar in place, monitor for changes in character of skin  -scalp laceration present on admission --stable appearance - 8-15: Complaints of itching under cervical collar.  Prescribed  Eucerin/hydrocortisone cream 1-1  7. Fluids/Electrolytes/Nutrition: monitor I/O and repeat labs in a.m.              - Dysphagia 3 with nectar thick liquids--continue aspiration precautions. Continue SLP   - 8/8: Eating 50 to 100% of meals; on  Free water protocol  for hydration.  Albumin 2.9, will encourage p.o. intakes, appreciate nutrition assistance.  8-11: Labs stable.  Albumin improved from 2.9-3.2.  Cleared for thin fluids today.  8/12: patient refused all meals 8/11; 100% breakfast this AM. Will monitor today - if continues to be poor will get nutritional consult  8-13: P.o. intakes picked up , okay to monitor for now  8. HTN: resumed home irbesartan --no BB due to bradycardia  - 8-8: Add as needed hydralazine  10 milligrams every 6 hours for SBP greater than 160, DBP greater than 100.  Increased blood pressure likely due to pain, monitor today may increase irbesartan  if renal function improves tomorrow.  - BP a little labile, continue to monitor trend and continue current medications  8/11: HTN with therapies, bradycardia - ?contribution of pain/headache vs. Anxiety. Self-resolves once calm.   8/13: HTN 140-160s; increase metolazone  to 5 mg daily--improved    12/05/2023    5:18 AM 12/04/2023    8:17 PM 12/04/2023    2:32 PM  Vitals with BMI  Weight 182 lbs 9 oz    BMI 27.76    Systolic 159 137 884  Diastolic 55 69 60  Pulse 49 60 54    9.  Chronic diastolic CHF/chronic lower extremity edema: patient's wife requested metolazone  and not Demadex .              -Currently on metolazone  2.5 mg daily, BNP 261-- wife says this CAN NOT BE REMOVED   - Daily weights and volume assessments.  - 8-11: Volume assessments and symptoms without signs of volume overload; chest x-ray 8-9 with improved pulmonary edema.  Weights downtrending while albumin is improving.  Monitor.  8/12: Stable cardiomegaly, pleural effusions on CXR--no external s/s volume overload; trend weights. May need to increase  metolazone .   8/13: Weights uptrending, BP elevated - increase metolazone  to 5 mg daily; already discussed as potential with patient's wife, informed nursing to notify family when they get in.  - Weight stable.  Filed Weights   12/03/23 0600 12/04/23 0618 12/05/23 0518  Weight: 81.6 kg 82.3 kg 82.8 kg    10. HLD: Continue home atorvastatin  11.  Mild anemia: Continue to monitor CBC recheck labs in a.m.  - 8-11 hemoglobin uptrending 11.4--12.0 on 8-14  12.  AKI: Creatinine 1.38 on admission baseline approximately 1.0--trend creatinine. Last Cr 1.52. Recheck tomorrow.  - 8-8: Creatinine 1.5, BUN slightly increased.  Plan for IV fluids very gentle normal saline at 50 cc/h for 10 hours.  Repeat BMP in AM.  Did discuss with wife that if AKI does not improve, may need to adjust scheduled Celebrex  to as needed.  -8/9 Cr a little better at 1.51, change celebrex  to PRN  -8/11 - BUN improving, Cr normalized - continue current regimen     13. Obesity             Body mass index is 30.3 kg/m.  14. Bradycardia/atrial fibrillation             -Pt was evaluated by cardiology, no indication for temporary pacing   - Regular rate  -8/9 HR stable currently, nursing reports occasional bradycardic episodes at night, pt was evaluated by cardiology for this during acute admission  8-11: Reported episode of bradycardia 30s to 50s during therapies today; self resolved, patient asymptomatic.  If recurs again, will get EKG.  8/12: 2x episode SOB and bradycardia 30-50s with therapy this AM; EKG showed ?a flutter with ventricular rate 40s. Re-consulted cardiology for symptomatic  bradycardia, added PRN atropine  0.5 mg IV (for use with rapid response if needed); not candidate for pacemake with last evaluation 8/13: Per EP: If he has episodes of syncope related to bradycardia, pacemaker implant would be reasonable. As he does have a c-collar in place, we will hold off for now. He can follow-up with general cardiology.  --No recurrence 12-04-13  15. Hx of aortic aneurysm/dissection -f/u outpatient, BP control   -Blood pressure management as above, has as needed hydralazine   16. Aortic stenosis             -Repeat echo 6-12 months has been recommended  17. L knee pain             -L knee injection completed by ortho 8/1--no current complaints of pain  18.  Right shoulder pain--no complaints on exam 8-11  19.  Dysphagia/secretion management  - Mucinex  600 mg twice daily  - Robitussin DM as needed  - DC Claritin  10 mg daily  -Was placed on Unasyn  --> Augmentin  for suspected aspiration, chest x-ray 8-9 improved without opacity; finish antibiotics at 10 days total  -8-8: Patient managing secretions well on current exam, no current rhonchi or wheezing, no concerns for aspiration.  Monitor oxygen and respiratory status closely.  Can add scopolamine patch if needed, but would hold off due to multiple anticholinergic medications unless absolutely needed.  -8/9  Restart claritin -has allergies hx, discussed increased use of suction  -8/10 nasal congestion no better--Flonase  BID started 8/8  8-11: MBS today with pill dysphagia, otherwise cleared for thin liquids.  Possible prevertebral edema noted at C2, discussed with Dr. Georgina with orthopedics, will start Decadron  4 mg twice daily x 5 days with possible taper to see if this improves dysphagia  8/12: POs better today; monitor as above  8-14: Continues to improve; wife concerned regarding ongoing secretions and dysarthria, wants to discuss increasing steroid regimen with neurosurgery but does not want to involve Dr. Lanis  - Dr. Gillie called back to provide input; would not increase steroid regimen, no additional interventions offered.  Appreciate his expertise.   - 8-15: Finishing up Decadron  4 mg twice daily; will provide gradual taper of 2 mg twice daily for 2 days and 1 mg twice daily for 1 day prior to discharge  20. Occasional SOB and Anxiety,  ?vertigo/fear of falling   -Suspect most likely anxiety but secretions could be contributing. Will check CXR-negative for acute changes, Consider prn anxiety medication    -start xanax  0.25 PRN, discussed with his wife  - 39/10 patient's wife would like to have option for 0.125 dose of Xanax  as needed.  Order adjusted   -Schedule for neuropscyh- next available --discussed patient case with Dr. Corina 8-11  - 8/12: Recurrent, associated with bradycardia and HTN; See #4, #14 above. Also with bed positional changes. May benefit from Buspar trial, family wishes to defer at this time. Offered atarax as alternative to xanax , family defers at this time. Discussed limitations of audiology consult for vertigo; offered COR testing however patient has already seen ENT for OP evaluation for this.   8-13: No intervention per EP, still occurring primarily with head going back; resolves quickly with use of incentive spirometer  --See #4 above.   LOS: 7 days A FACE TO FACE EVALUATION WAS PERFORMED  Zachary Underwood Likes 12/05/2023, 9:47 AM

## 2023-12-05 NOTE — Progress Notes (Signed)
 Physical Therapy Session Note  Patient Details  Name: Zachary Underwood MRN: [id number] Date of Birth: 1928/02/05  Today's Date: 12/05/2023 PT Individual Time: 9159-9044 and 8582-8551 PT Individual Time Calculation (min): 75 min and 31 min  Short Term Goals: Week 1:  PT Short Term Goal 1 (Week 1): STGs = LTGs  Skilled Therapeutic Interventions/Progress Updates:   First session:  Pt presents semi-reclined in bed and agreeable to therapy.  Clothing obtained for pt.  Pt transfers to sitting EOB w/ supervision/distant supervision.  Pt doffed gown and donned undershirt and button-down shirt w/ min A for L arm into sleeve.  Pt transferred sit to stand w/ supervision and doffed soiled brief and performed pericare w/ supervision.  Pt returned to sitting for threading underwear and shorts over feet. Pt donned shoes w/ min A for heels and sit to stand transfer for pulling up shorts over hips and arranging clothing.  Pt amb to BR for continent void in toilet, supervision for steadying w/ clothing management, charted in Flowsheets.  Pt stood at sink for brushing teeth.  Pt amb to dayroom w/ rollator and supervision.  No episodes of SOB today!  Pt amb w/ improved placement of LES w/o slap.  Pt performed 5 STS in 29 seconds.  Pt performed TUG w/o AD and averaged 37.5 sec w/ increased risk of falls.  Pt performed standing cornhole x 2 w/ right UE on Right and then crossing midline w/o LOB.  Pt amb to room and remained sitting in recliner w/ seat alarm on and all needs in reach.  Second session:  Pt presents sitting in recliner and agreeable to PT.  Pt transfers sit to stand w/ supervision.  Pt amb multiple trials w/ rollator and supervision, verbal cues for soft stepping of feet.  Pt amb up to 150' each session.  Pt performed of 493' w/o LOB.  Pt returned to room and requested use of BR.  Pt amb into BR w/o AD and Close sup.  Pt continent of bladder in toilet, charted in Flowsheets.  Pt amb to recliner and seat  alarm on and all needs in reach.     Therapy Documentation Precautions:  Precautions Precautions: Fall, Cervical Required Braces or Orthoses: Cervical Brace Cervical Brace: At all times, Hard collar Restrictions Weight Bearing Restrictions Per Provider Order: No General:   Vital Signs:   Pain:0/10     Therapy/Group: Individual Therapy  Brookelynn Hamor P Noor Vidales 12/05/2023, 10:09 AM

## 2023-12-05 NOTE — Progress Notes (Signed)
 Occupational Therapy Session Note  Patient Details  Name: Zachary Underwood MRN: [id number] Date of Birth: 1928/02/06  Today's Date: 12/05/2023 OT Individual Time: pt missed 60 mins of OT session d/t fatigue Session 2: 8695-8645      Short Term Goals: Week 1:  OT Short Term Goal 1 (Week 1): STG= LTG d/t ELOS  Skilled Therapeutic Interventions/Progress Updates:  Session 1: Pt greeted asleep in recliner, pt aroused partially to decline session and then returned back to sleep. Pt missed 60 mins of session d/t fatigue.       Session 2: Pt greeted seated in recliner, pt agreeable to OT intervention.      Transfers/bed mobility/functional mobility:  Pt completed all functional ambulation with rollator with supervision. Pt did complete several stand pivot transfers with no AD and CGA.   Therapeutic activity:  Pt completed single leg tapping onto targets on floor with a focus on single leg stance as precursor to navigating home entrance with BUE support from parallel bars. Pt completed task with no LOB and CGA.  Pt completed lateral stepping over cones with BUE support from parallel bars with pt instructed to step over cones to R and L side with a focus on improved balance and proprioception in order to navigate transferring over tub threshold.   Pt completed standing balance task as BITs with pt instructed to follow a moving stimulus in a pre defined pattern. Pt instructed to maintain reticle on the target stimulus through lateral and anterior/posterior body movement in order to facilitate improved reaction time/balance reactions and postural control. Pt having great difficulty maintaining isometric control on target, no true LOB noted.    Exercises:  Pt completed below standing therex to facilitate improved LB strength and endurance to aid in independence with LB ADLs.  X20 standing hip abd/add against resistance X20 standing hip extensions against resistance X20 calf raises                   Ended session with pt seated in recliner with all needs within reach and chair alarm activated.                    Therapy Documentation Precautions:  Precautions Precautions: Fall, Cervical Required Braces or Orthoses: Cervical Brace Cervical Brace: At all times, Hard collar Restrictions Weight Bearing Restrictions Per Provider Order: No  Pain: No pain    Therapy/Group: Individual Therapy  Ronal Gift [facility] 12/05/2023, 7:31 AM

## 2023-12-05 NOTE — Progress Notes (Addendum)
 Patient ID: JOSHAU CODE, male   DOB: [birth year], 89 y.o.   MRN: [id number]  SW received updates from [facility]- [facility] [phone] ext [phone]) who stated pt just had an increase in aide hours in May 2025 and not eligible for further increase.   1149- SW called pt [name] to follow-up about her request to speak with SW after stating to nursing she has considered SNF if he requires more rehab. She reports she is interested in options and understanding his functional abilities. SW read through PT/OT notes. SW discussed her coming in for family edu before making a decision. Today she states she will be present for the family edu 1:30pm-4pm. SW discussed time scheduled was 1pm-4pm. She is going to speak with the aide about her availability since she does not drive. SW explained SNF placement process, and shared pt would have to agree. States she understands. She asks SW to put in request for the [facility] to install grab bars in the hallway as the RW is too big for their hallway. States she will need in the bathroom as well. SW will leave SNF list at front desk for her pick up.   SW sent order to [facility].   Graeme Jude, MSW, LCSW Office: [phone] Cell: [phone] Fax: [phone]

## 2023-12-05 NOTE — Discharge Instructions (Signed)
 Inpatient Rehab Discharge Instructions  Zachary Underwood Discharge date and time:    Activities/Precautions/ Functional Status: Activity: no lifting, driving, or strenuous exercise  till cleared by MD Diet: low fat, low cholesterol diet Wound Care: none needed   Functional status:  ___ No restrictions     ___ Walk up steps independently _X__ 24/7 supervision/assistance   ___ Walk up steps with assistance ___ Intermittent supervision/assistance  ___ Bathe/dress independently ___ Walk with walker     _X__ Bathe/dress with assistance ___ Walk Independently    ___ Shower independently _X__ Walk with assistance    ___ Shower with assistance _X__ No alcohol     ___ Return to work/school ________  Special Instructions: Need to wear collar at all times.    My questions have been answered and I understand these instructions. I will adhere to these goals and the provided educational materials after my discharge from the hospital.  Patient/Caregiver Signature _______________________________ Date __________  Clinician Signature _______________________________________ Date __________  Please bring this form and your medication list with you to all your follow-up doctor's appointments.

## 2023-12-06 DIAGNOSIS — S12100S Unspecified displaced fracture of second cervical vertebra, sequela: Secondary | ICD-10-CM

## 2023-12-06 MED ORDER — TRIAMCINOLONE 0.1 % CREAM:EUCERIN CREAM 1:1
TOPICAL_CREAM | Freq: Two times a day (BID) | CUTANEOUS | Status: DC | PRN
  Filled 2023-12-06: qty 60

## 2023-12-06 MED ORDER — DEXAMETHASONE SODIUM PHOSPHATE 4 MG/ML IJ SOLN
4.0000 mg | Freq: Two times a day (BID) | INTRAMUSCULAR | Status: AC
  Administered 2023-12-06 – 2023-12-07 (×2): 4 mg via INTRAVENOUS
  Filled 2023-12-06 (×2): qty 1

## 2023-12-06 MED ORDER — DEXAMETHASONE SODIUM PHOSPHATE 4 MG/ML IJ SOLN
2.0000 mg | Freq: Two times a day (BID) | INTRAMUSCULAR | Status: AC
  Administered 2023-12-07 – 2023-12-09 (×4): 2 mg via INTRAVENOUS
  Filled 2023-12-06 (×4): qty 1

## 2023-12-06 MED ORDER — ALPRAZOLAM 0.25 MG PO TABS
0.1250 mg | ORAL_TABLET | ORAL | Status: DC | PRN
  Administered 2023-12-07 – 2023-12-11 (×2): 0.125 mg via ORAL
  Filled 2023-12-06 (×2): qty 1

## 2023-12-06 MED ORDER — DEXAMETHASONE SODIUM PHOSPHATE 4 MG/ML IJ SOLN
1.0000 mg | Freq: Two times a day (BID) | INTRAMUSCULAR | Status: AC
  Administered 2023-12-09 – 2023-12-10 (×2): 1 mg via INTRAVENOUS
  Filled 2023-12-06 (×2): qty 1

## 2023-12-06 NOTE — Progress Notes (Signed)
 Physical Therapy Session Note  Patient Details  Name: Zachary Underwood MRN: [id number] Date of Birth: 10/27/1927  Today's Date: 12/06/2023 PT Individual Time: 1432-1530 PT Individual Time Calculation (min): 58 min   Short Term Goals: Week 1:  PT Short Term Goal 1 (Week 1): STGs = LTGs  Skilled Therapeutic Interventions/Progress Updates: Pt presents sitting in recliner and agreeable to therapy.  Spouse and aide present for family ed.  Pt transfers all surfaces throughout session w/ supervision, although ed given to CG's and cues given for hand placement occasionally.  Pt amb to main gym w/ rollator and supervision, cues for foot clearance, step length and foot control but improved from previous sessions, spouse pleased.  Long discussion re: entrances to house and agreed that 2 steps in back w/ railing would be best until [facility] PT can trial.  Pt negotiated 8 steps w/ L railing ascending to simulate home.  Spouse states that usually uses SPC on Right at home.  Pt amb to small gym for car transfer to SUV height w/ supervision only.  Pt requires increased time but performs safely.  Mat table elevated to bed height as stated by spouse and aide.  Pt performs stand to sit to bed height w/ supervision.  Pt also amb short distance w/o AD and CGA, cues for foot clearance, step length.  Pt returned to room and stand to sit on recliner w/ supervision.  Aide remains in room along w/ spouse.     Therapy Documentation Precautions:  Precautions Precautions: Fall, Cervical Required Braces or Orthoses: Cervical Brace Cervical Brace: At all times, Hard collar Restrictions Weight Bearing Restrictions Per Provider Order: No General:   Vital Signs: Therapy Vitals Temp: (!) 97.5 F (36.4 C) Temp Source: Oral Pulse Rate: (!) 36 Resp: 20 BP: 106/62 Patient Position (if appropriate): Sitting Oxygen Therapy SpO2: 100 % O2 Device: Room Air Pain:0/10      Therapy/Group: Individual Therapy  Denna Fryberger P  Mercadez Heitman 12/06/2023, 3:51 PM

## 2023-12-06 NOTE — Progress Notes (Signed)
 Physical Therapy Session Note  Patient Details  Name: Zachary Underwood MRN: [id number] Date of Birth: Sep 21, 1927  Today's Date: 12/06/2023 PT Individual Time: 0902-0930 PT Individual Time Calculation (min): 28 min   Short Term Goals: Week 1:  PT Short Term Goal 1 (Week 1): STGs = LTGs   Skilled Therapeutic Interventions/Progress Updates:  Patient semi-reclined in bed on entrance to room. Patient alert and agreeable to PT session.   Patient with no pain complaint at start of session. Relates need to toilet.   Pt performed supine > sit with supervision. No cueing required. Dons shoes with MinA. Prefers to keep gown on over undershirt and wear pajama pants for session. Then performed sit<>stand, stand pivot, and toilet transfers throughout session with supervision. No cueing required for technique. During toileting, pericare performed with setup as pt able to clean initially with toilet paper and then requires wet washcloths to fully clean. Ambulatory transfer to sink using rollator in order to wash face and hands, then brush teeth with supervision.   Initiated gait training with pt using rollator and completes 285 ft with confident stepping and consistent pace and step length/ height. Then able to demonstrate good backward stepping with toe-to-heel technique with rollator. Pivot step to L and great balance throughout direction changes. Safe rollator use.   Patient seated upright in recliner at end of session with brakes locked, seat pad alarm set, and all needs within reach.   Therapy Documentation Precautions:  Precautions Precautions: Fall, Cervical Required Braces or Orthoses: Cervical Brace Cervical Brace: At all times, Hard collar Restrictions Weight Bearing Restrictions Per Provider Order: No  Pain:  No pain related this session.    Therapy/Group: Individual Therapy  Mliss DELENA Milliner PT, DPT, CSRS 12/06/2023, 12:50 PM

## 2023-12-06 NOTE — Progress Notes (Signed)
 Occupational Therapy Weekly Progress Note  Patient Details  Name: CHAYSEN TILLMAN MRN: [id number] Date of Birth: Aug 30, 1927  Beginning of progress report period: November 29, 2023 End of progress report period: December 06, 2023  Today's Date: 12/06/2023 OT Individual Time: 8991-8948 OT Individual Time Calculation (min): 43 min   Verdis is making great progress toward his OT goals- no short term goals set d/t initial short ELOS, which has been extended d/t request of his wife and need for increased balance and endurance training within the context of ADLs. He is able to complete UB and LB bathing/dressing at (S) level overall. He is using a rollator for ADL transfers at a (S) level.   Patient continues to demonstrate the following deficits: muscle weakness, decreased cardiorespiratoy endurance, and decreased standing balance, decreased postural control, and decreased balance strategies and therefore will continue to benefit from skilled OT intervention to enhance overall performance with BADL, iADL, and Vocation.  Patient progressing toward long term goals..  Plan of care revisions: Transfers downgraded to supervision to reflect baseline functioning.  OT Short Term Goals Week 1:  OT Short Term Goal 1 (Week 1): STG= LTG d/t ELOS OT Short Term Goal 1 - Progress (Week 1): Progressing toward goal Week 2:  OT Short Term Goal 1 (Week 2): STG= LTG d/t ELOS  Skilled Therapeutic Interventions/Progress Updates:   Pt received sitting in the recliner with no c/o pain but asking about an ointment for his face d/t collar bothering the skin. He reports a previous staff member told him he would need this- unsure of who as neither DO nor PA endorses but did f/u with both and they prescribed something that RN will f/u with. Pt also suggesting that perhaps shaving on Wednesday made him sensitive. He donned a second gown for warmth and stood from recliner with (S). He completed functional mobility into the bathroom  with (S) using the rollator. He completed all toileting tasks with (S). He then completed 400 ft of functional mobility with the rollator to outside the hospital. While sitting outside, discussed d/c planning and hx of falls at length. He returned inside following ,another 400 ft with the rollator with (S). He returned to his room and was left sitting up with all needs met.    Therapy Documentation Precautions:  Precautions Precautions: Fall, Cervical Required Braces or Orthoses: Cervical Brace Cervical Brace: At all times, Hard collar Restrictions Weight Bearing Restrictions Per Provider Order: No Therapy/Group: Individual Therapy  Nena VEAR Moats 12/06/2023, 7:26 AM

## 2023-12-06 NOTE — Progress Notes (Signed)
 Patient ID: Zachary Underwood, male   DOB: [birth year], 89 y.o.   MRN: [id number]  1021-SW received call from pt wife inquiring about CD of imaging. SW explained when trying to get pt to sign form he did not feel well so it was not done. It can be signed today when she comes in.   1117- SW received phone call from pt wife who inquired about a 3in1 BSC needed and urinals. SW shared unaware item was needed but will order items. She was advised to take the urinals in the room until the [facility] ships them what is needed.   SW faxed order form to [facility] and informed consult placed.   *SW met with pt to sign consent of release form to medical records and radiology.   Graeme Jude, MSW, LCSW Office: [phone] Cell: [phone] Fax: [phone]

## 2023-12-06 NOTE — Progress Notes (Signed)
 Occupational Therapy Session Note  Patient Details  Name: Zachary Underwood MRN: [id number] Date of Birth: Jun 04, 1927  Today's Date: 12/06/2023 OT Individual Time: 8666-8566 OT Individual Time Calculation (min): 60 min    Short Term Goals: Week 2:  OT Short Term Goal 1 (Week 2): STG= LTG d/t ELOS  Skilled Therapeutic Interventions/Progress Updates:    Pt sitting in recliner with no c/o pain, agreeable to session. His wife, personal aid [name], and friend were all present for family education. Provided overview of CLOF, OT goals, and ADL performance. Verbal education provided re fall risk reduction, energy conservation strategies, home carryover of transfer training, ADLs, and IADLs. Demonstration and hands on training completed for pt performance of UB/LB bathing and dressing at (S)- Mod I level, toileting hygiene and transfers, and shower transfers. Provided education and demonstration on DME use recommendations at home. Provided hands on training with demonstration and then teachback of changing out the pads on his cervical collar. [name] was able to return demonstration with min cueing. Pt required extra time to assume supine position d/t anxiety attacks whenever he was fully supine. Notified RN of request for PRN Xanax . We did figure out he does much better side lying so used this position to change collar with his neck still fully supported and in neutral alignment. He completed toileting at mod I level. He ended with 200 ft of functional mobility at (S) level, Dealer. He was left sitting up in the recliner with all needs met.   Therapy Documentation Precautions:  Precautions Precautions: Fall, Cervical Required Braces or Orthoses: Cervical Brace Cervical Brace: At all times, Hard collar Restrictions Weight Bearing Restrictions Per Provider Order: No Therapy/Group: Individual Therapy  Nena VEAR Moats 12/06/2023, 12:45 PM

## 2023-12-06 NOTE — Progress Notes (Signed)
 Speech Language Pathology Weekly Progress and Session Note  Patient Details  Name: Zachary Underwood MRN: [id number] Date of Birth: Feb 03, 1928  Beginning of progress report period: November 29, 2023 End of progress report period: December 06, 2023  Today's Date: 12/06/2023 SLP Individual Time: 8698-8668 SLP Individual Time Calculation (min): 30 min  Short Term Goals: Week 1: SLP Short Term Goal 1 (Week 1): STGs = LTGs d/t ELOS SLP Short Term Goal 1 - Progress (Week 1): Progressing toward goal    New Short Term Goals: Week 2: SLP Short Term Goal 1 (Week 2): STGs=LTGs d/t ELOS  Weekly Progress Updates: Patient has made excellent progress throughout reporting period, continuing to progress towards speech therapy long term goals. Of note, estimated length of stay was shorter than actual length of stay, thus short term goals not originally created. Patient is progressing at an adequate pace to meet long term goals by discharge. Patient currently tolerates Dys3/thin liquid diet and completes all swallowing and communication exercises with supervision to min assist. Patient and family education ongoing. Patient will continue to benefit from skilled therapy services during remainder of CIR stay.       Intensity: Minumum of 1-2 x/day, 30 to 90 minutes Frequency: 3 to 5 out of 7 days Duration/Length of Stay: 8/20 Treatment/Interventions: Speech/Language facilitation;Cognitive remediation/compensation;Cueing hierarchy;Functional tasks;Therapeutic Activities;Therapeutic Exercise;Oral motor exercises;Patient/family education;Dysphagia/aspiration precaution training   Daily Session  Skilled Therapeutic Interventions: SLP conducted skilled therapy session targeting family education pertaining to swallowing and communication goals. Wife and new member of the family as described by wife were present throughout session and taking notes/asking appropriate questions. Reviewed current diet recommendations,  swallowing exercises, speech exercises, and expected continued progress and anticipated timeline. Wife requested written list of speech exercises and also requests that nursing staff ensure pill crusher is sent home with family upon discharge. SLP provided orientation to speech exercise handout present in room and will plan to provide written [place] tongue hold swallow instructions prior to discharge. Patient and family verbalized understanding to all information provided throughout. Patient was left in room with call bell in reach and alarm set. SLP will continue to target goals per plan of care.        Pain  None endorsed during session  Therapy/Group: Individual Therapy  Kieren Ricci, M.A., CCC-SLP  Tashema Tiller A Leina Babe 12/06/2023, 2:12 PM

## 2023-12-06 NOTE — Progress Notes (Signed)
   12/06/23 1935  Assess: MEWS Score  Temp (!) 97.4 F (36.3 C)  BP 128/71  MAP (mmHg) 90  Pulse Rate (!) 55  Resp 16  SpO2 100 %  O2 Device Room Air  Assess: MEWS Score  MEWS Temp 0  MEWS Systolic 0  MEWS Pulse 0  MEWS RR 0  MEWS LOC 0  MEWS Score 0  MEWS Score Color Green  Assess: if the MEWS score is Yellow or Red  Were vital signs accurate and taken at a resting state? Yes  Does the patient meet 2 or more of the SIRS criteria? No  MEWS guidelines implemented  No, previously yellow, continue vital signs every 4 hours  Assess: SIRS CRITERIA  SIRS Temperature  0  SIRS Respirations  0  SIRS Pulse 0  SIRS WBC 0  SIRS Score Sum  0

## 2023-12-07 NOTE — Progress Notes (Signed)
 Physical Therapy Session Note  Patient Details  Name: Zachary Underwood MRN: [id number] Date of Birth: 1928-03-09  Today's Date: 12/07/2023 PT Individual Time: 0916-1012 PT Individual Time Calculation (min): 56 min   Short Term Goals: Week 1:  PT Short Term Goal 1 (Week 1): STGs = LTGs  Skilled Therapeutic Interventions/Progress Updates:  Pt was seen bedside in the am. Pt sitting up in recliner. Pt performed all transfers with S an rollator. Pt ambulated 200 feet x 2 and 50 feet with S and rollator. Pt ascended/descended 8 stairs with L rail and st cane with contact guard. Pt performed step taps and alternating step taps 3 sets x 10 reps each for LE strengthening. Pt returned to room following treatment and left sitting up in recliner with chair alarm in place and all needs within reach.   Therapy Documentation Precautions:  Precautions Precautions: Fall, Cervical Required Braces or Orthoses: Cervical Brace Cervical Brace: At all times, Hard collar Restrictions Weight Bearing Restrictions Per Provider Order: No General:   Vital Signs:   Pain: Pt c/o 2/10 neck pain.   Therapy/Group: Individual Therapy  Livian Vanderbeck G 12/07/2023, 12:30 PM

## 2023-12-07 NOTE — Plan of Care (Signed)
  Problem: Consults Goal: RH GENERAL PATIENT EDUCATION Description: See Patient Education module for education specifics. Outcome: Progressing   Problem: RH BOWEL ELIMINATION Goal: RH STG MANAGE BOWEL WITH ASSISTANCE Description: STG Manage Bowel with mod I Assistance. Outcome: Progressing   Problem: RH BLADDER ELIMINATION Goal: RH STG MANAGE BLADDER WITH ASSISTANCE Description: STG Manage Bladder With mod I Assistance Outcome: Progressing   Problem: RH SKIN INTEGRITY Goal: RH STG SKIN FREE OF INFECTION/BREAKDOWN Description: [place] skin free of infection/breakdown with mod I assistance Outcome: Progressing   Problem: RH SAFETY Goal: RH STG ADHERE TO SAFETY PRECAUTIONS W/ASSISTANCE/DEVICE Description: STG Adhere to Safety Precautions With Assistance/Device. Outcome: Progressing   Problem: RH PAIN MANAGEMENT Goal: RH STG PAIN MANAGED AT OR BELOW PT'S PAIN GOAL Description: <4 w/ prns Outcome: Progressing   Problem: RH KNOWLEDGE DEFICIT GENERAL Goal: RH STG INCREASE KNOWLEDGE OF SELF CARE AFTER HOSPITALIZATION Description: Manage increase knowledge of self care after hospitalization with mod I assistance using educational materials provided from wife  Outcome: Progressing

## 2023-12-07 NOTE — Progress Notes (Signed)
 PROGRESS NOTE   Subjective/Complaints: No new complaints this morning Asks for 2 vanilla Ensure, asked nursing to please bring VSS  ROS: Denies fevers, chills, N/V, abdominal pain, constipation, diarrhea, chest pain, new weakness or paraesthesias.   + Dysphagia/dysarthria--improving + Intermittent shortness of breath and anxiety- ongoing, with reclining, and elevating bed + Neck itching  Objective:   No results found.  Recent Labs    12/05/23 0453  WBC 9.5  HGB 12.0*  HCT 37.6*  PLT 183   Recent Labs    12/05/23 0453  NA 137  K 4.1  CL 107  CO2 19*  GLUCOSE 99  BUN 35*  CREATININE 1.21  CALCIUM  9.6    Intake/Output Summary (Last 24 hours) at 12/07/2023 1324 Last data filed at 12/07/2023 0810 Gross per 24 hour  Intake 600 ml  Output 400 ml  Net 200 ml        Physical Exam: Vital Signs Blood pressure 133/69, pulse (!) 52, temperature 98.1 F (36.7 C), resp. rate 19, height 5' 8 (1.727 m), weight 80.1 kg, SpO2 94%.    General: No apparent distress.  Ambulating with rolling walker with CGA. HEENT: EOMI.  PERRLA.  No nystagmus.  Head is normocephalic. Neck: Supple without JVD or lymphadenopathy.  C-collar intact.   Heart:   Bradycardia, irregular rhythm.  + systolic ejection murmur Chest: CTA bilaterally without wheezes, rales, or rhonchi; no distress.     Abdomen: Soft, non-tender, non-distended, bowel sounds positive. Extremities:    trace+ bilateral lower extremity edema Psych: Pt's affect is appropriate. Pt is cooperative.  Skin: Clean and intact without signs of breakdown  + L shoulder - Chronic Lipoma--stable  + Right anterior scalp laceration --stable--under mepilex dressing, c/d No apparent rash, erythema around itching site around chin, collar  PRIOR EXAMS: Neuro:   Mental Status: Awake, alert; oriented to self, place, and time. No obvious cognitive deficits.   Speech/Languate: Intact,  mild dysarthria.  100% intelligible. CRANIAL NERVES: 2 through 12 grossly intact   MOTOR: Moving all 4 limbs antigravity, against resistance in bed.  Strength: 4+/5 throughout bilateral upper and lower extremities     SENSORY: Normal to touch all 4 extremities.     Coordination: No ataxia, no tremor  Gait: Ambulates with a rolling walker at supervision level, stable stance and stride   MSK: Right shoulder abduction limited to less than 90 degrees--unchanged  Assessment/Plan: 1. Functional deficits which require 3+ hours per day of interdisciplinary therapy in a comprehensive inpatient rehab setting. Physiatrist is providing close team supervision and 24 hour management of active medical problems listed below. Physiatrist and rehab team continue to assess barriers to discharge/monitor patient progress toward functional and medical goals  Care Tool:  Bathing    Body parts bathed by patient: Right arm, Left arm, Abdomen, Chest, Front perineal area, Buttocks, Right upper leg, Left upper leg, Face, Right lower leg, Left lower leg   Body parts bathed by helper: Right lower leg, Left lower leg     Bathing assist Assist Level: Supervision/Verbal cueing     Upper Body Dressing/Undressing Upper body dressing   What is the patient wearing?: Pull over shirt  Upper body assist Assist Level: Supervision/Verbal cueing    Lower Body Dressing/Undressing Lower body dressing    Lower body dressing activity did not occur: Refused What is the patient wearing?: Pants     Lower body assist Assist for lower body dressing: Supervision/Verbal cueing     Toileting Toileting    Toileting assist Assist for toileting: Supervision/Verbal cueing     Transfers Chair/bed transfer  Transfers assist     Chair/bed transfer assist level: Supervision/Verbal cueing     Locomotion Ambulation   Ambulation assist      Assist level: Supervision/Verbal cueing Assistive device:  Rollator Max distance: 200   Walk 10 feet activity   Assist     Assist level: Supervision/Verbal cueing Assistive device: Rollator   Walk 50 feet activity   Assist    Assist level: Supervision/Verbal cueing Assistive device: Rollator    Walk 150 feet activity   Assist    Assist level: Supervision/Verbal cueing Assistive device: Rollator    Walk 10 feet on uneven surface  activity   Assist     Assist level: Minimal Assistance - Patient > 75% Assistive device: Walker-rolling   Wheelchair     Assist Is the patient using a wheelchair?: Yes Type of Wheelchair: Manual      Max wheelchair distance: 150'    Wheelchair 50 feet with 2 turns activity    Assist        Assist Level: Dependent - Patient 0%   Wheelchair 150 feet activity     Assist      Assist Level: Dependent - Patient 0%   Blood pressure 133/69, pulse (!) 52, temperature 98.1 F (36.7 C), resp. rate 19, height 5' 8 (1.727 m), weight 80.1 kg, SpO2 94%.  Medical Problem List and Plan: 1. Functional deficits secondary to type II posterior displaced odontoid fracture after a fall             -patient may shower; maintain cervical collar 24/7             -ELOS/Goals: 5-7 days, PT/OT/SLP Mod I -- 8/20 DC date             -Pts wife would like to be notified before any medication changes  Continue CIR    - 8/7: Dr. Burnetta with ortho spine provided second opinion; recommendations  - Hard cervical collar at all times.  - reevaluate the fracture with x-rays in 4 weeks. - - Recommend he continue to use Celebrex  and muscle relaxer as this is controlling his pain.  - follow-up with me in 4 weeks.  - 8/7: Provided list of PRN medications to patient  - 8/12: Reported caregiver at home was bathing and dressing him. Currently full SPV most ADLs, limited by SOB/symptomatic bradycardia episodes when mobilized--goals Mod I. SPV bed mobility/transfers, CGA with rollator 150 ft. Goals SPV.  Limited by endurance. 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   - Family meeting with patient, wife, nursing admin, hospital admin, Architectural technologist, and medical team  today to discuss patient care -- provided appropriate boundaries with staff (ex. Not following nurses in hallway), reasonable expectations for medical updates, and patient advocacy - provided physician email and office # and discussed physician schedule, NP availability and limitations for medical updates/communication. Wife, team in agreement on minimizing stress with nursing surrounding attending to the patient and his needs. Emphasized OK to seek urgent care if emergency is suspected.    -  Will get nursing to provide call button due to difficulty finding call bell with C collar   - Due to anxiety and fear of falling exacerbated by posterior lean, along with ongoing surgical precautions with high risk of odontoid displacement with malpositioning, feel patient would benefit from a hospital bed to facilitate head elevation and improve bed mobility and independence.  2.  Antithrombotics: -DVT/anticoagulation:  Mechanical: continue Sequential compression devices, below knee Bilateral lower extremities Pharmaceutical: Xarelto              -antiplatelet therapy: N/A  3. Pain Management: continue Celebrex  (monitor Cr) Robaxin , oxycodone  as needed             - Left knee pain-received cortisone injection by orthopedics  - 8-8: Switch to tramadol  50 mg for moderate pain and oxycodone  2.5 mg for severe pain every 6 hours as needed.  Continue Robaxin , Celebrex .  Nursing to inquire about location of cold packs patient had on prior floor.  8/9: Cold therapy Prn for shoulders, head  8/10 discussed with patient's wife, patient does not ask for tramadol  as needed.  Will schedule twice a day--tolerating  8-11: Left-sided headache.  Patient refusing ice packs.  Per report, tramadol  not effective; changed  to as needed.  Patient has Celebrex , oxycodone  available.  Allergic to Tylenol, could consider Topamax but cautious to oversedate.  Continue current regimen for now.  8/12: D/w wife, requests resumption of scheduled celebrex  200 mg BID understanding may result in recurrent AKI. He has been tolerating once daily up to this point - will trial and check labs Thursday.  - DC tramadol .  - Discussed Fioricet, Journavix as alternative PRNS for headache-- family wishes to hold off at this time.   8/13: minimal pain on this regimen; denies any headaches 8-14: Renal function is tolerating Celebrex ; continue  4. Mood/Behavior/Sleep: LCSW to follow for evaluation and support when available.              -antipsychotic agents: N/A  - 8-8.  Benadryl  25 mg nightly as needed for sleep; per wife, patient uses this at home--has not requested, sleeping well per nursing  - 8/12: 1x PRN 0.125 xanax  today with symptomatic improvement but significant lethargy; maintain minimal dosing only, remove 0.25 PRN option and reduce to BID PRN  8/14: Not requiring Xanax  currently; will monitor and potentially DC before the weekend  8-15: 4 episodes of anxiety today, family requesting more frequent Xanax  since he is tolerating it well; emphasized this will need to be picked up by PCP at discharge if he is can to continue, changed to every 2 hours as needed, continue Xanax   5. Neuropsych/cognition: This patient is capable of making decisions on his own behalf.  6. Skin/Wound Care: routine pressure relief measures -[place] J collar in place, monitor for changes in character of skin  -scalp laceration present on admission --stable appearance - 8-15: Complaints of itching under cervical collar. Continue Eucerin/hydrocortisone cream 1-1  7. Fluids/Electrolytes/Nutrition: monitor I/O and repeat labs in a.m.              - Dysphagia 3 with nectar thick liquids--continue aspiration precautions. Continue SLP   - 8/8: Eating 50 to 100%  of meals; on  Free water protocol for hydration.  Albumin 2.9, will encourage p.o. intakes, appreciate nutrition assistance.  8-11: Labs stable.  Albumin improved from 2.9-3.2.  Cleared for thin fluids today.  8/12: patient refused all meals 8/11; 100% breakfast this AM. Will monitor today - if continues  to be poor will get nutritional consult  8-13: P.o. intakes picked up , okay to monitor for now  8. HTN: resumed home irbesartan --no BB due to bradycardia  - 8-8: Add as needed hydralazine  10 milligrams every 6 hours for SBP greater than 160, DBP greater than 100.  Increased blood pressure likely due to pain, monitor today may increase irbesartan  if renal function improves tomorrow.  - BP a little labile, continue to monitor trend and continue current medications  8/11: HTN with therapies, bradycardia - ?contribution of pain/headache vs. Anxiety. Self-resolves once calm.   8/13: HTN 140-160s; increase metolazone  to 5 mg daily--improved    12/07/2023    5:00 AM 12/07/2023    4:36 AM 12/07/2023   12:31 AM  Vitals with BMI  Weight 176 lbs 9 oz    BMI 26.86    Systolic  133 140  Diastolic  69 69  Pulse  52 53    9.  Chronic diastolic CHF/chronic lower extremity edema: patient's wife requested metolazone  and not Demadex .              -Currently on metolazone  2.5 mg daily, BNP 261-- wife says this CAN NOT BE REMOVED   - Daily weights and volume assessments.  - 8-11: Volume assessments and symptoms without signs of volume overload; chest x-ray 8-9 with improved pulmonary edema.  Weights downtrending while albumin is improving.  Monitor.  8/12: Stable cardiomegaly, pleural effusions on CXR--no external s/s volume overload; trend weights. May need to increase metolazone .   8/13: Weights uptrending, BP elevated - increase metolazone  to 5 mg daily; already discussed as potential with patient's wife, informed nursing to notify family when they get in.  - Weight stable.  Filed Weights   12/05/23 0518  12/06/23 0700 12/07/23 0500  Weight: 82.8 kg 81.4 kg 80.1 kg    10. YOI:Rnwupwlz home atorvastatin   11.  Mild anemia: Continue to monitor CBC recheck labs in a.m.  - 8-11 hemoglobin uptrending 11.4--12.0 on 8-14  12.  AKI: Creatinine 1.38 on admission baseline approximately 1.0--trend creatinine. Last Cr 1.52. Recheck tomorrow.  - 8-8: Creatinine 1.5, BUN slightly increased.  Plan for IV fluids very gentle normal saline at 50 cc/h for 10 hours.  Repeat BMP in AM.  Did discuss with wife that if AKI does not improve, may need to adjust scheduled Celebrex  to as needed.  -8/9 Cr a little better at 1.51, change celebrex  to PRN  -8/11 - BUN improving, Cr normalized - continue current regimen     13. Obesity             Body mass index is 30.3 kg/m.  14. Bradycardia/atrial fibrillation             -Pt was evaluated by cardiology, no indication for temporary pacing   - Regular rate  -8/9 HR stable currently, nursing reports occasional bradycardic episodes at night, pt was evaluated by cardiology for this during acute admission  8-11: Reported episode of bradycardia 30s to 50s during therapies today; self resolved, patient asymptomatic.  If recurs again, will get EKG.  8/12: 2x episode SOB and bradycardia 30-50s with therapy this AM; EKG showed ?a flutter with ventricular rate 40s. Re-consulted cardiology for symptomatic bradycardia, added PRN atropine  0.5 mg IV (for use with rapid response if needed); not candidate for pacemake with last evaluation 8/13: Per EP: If he has episodes of syncope related to bradycardia, pacemaker implant would be reasonable. As he does have a c-collar in  place, we will hold off for now. He can follow-up with general cardiology. --No recurrence 12-04-13  15. Hx of aortic aneurysm/dissection -f/u outpatient, BP control   -Blood pressure management as above, has as needed hydralazine   16. Aortic stenosis             -Repeat echo 6-12 months has been recommended  17.  L knee pain             -L knee injection completed by ortho 8/1--no current complaints of pain  18.  Right shoulder pain--no complaints on exam 8-11  19.  Dysphagia/secretion management  - Mucinex  600 mg twice daily  - Robitussin DM as needed  - DC Claritin  10 mg daily  -Was placed on Unasyn  --> Augmentin  for suspected aspiration, chest x-ray 8-9 improved without opacity; finish antibiotics at 10 days total  -8-8: Patient managing secretions well on current exam, no current rhonchi or wheezing, no concerns for aspiration.  Monitor oxygen and respiratory status closely.  Can add scopolamine patch if needed, but would hold off due to multiple anticholinergic medications unless absolutely needed.  -8/9  Restart claritin -has allergies hx, discussed increased use of suction  -8/10 nasal congestion no better--Flonase  BID started 8/8  8-11: MBS today with pill dysphagia, otherwise cleared for thin liquids.  Possible prevertebral edema noted at C2, discussed with Dr. Georgina with orthopedics, will start Decadron  4 mg twice daily x 5 days with possible taper to see if this improves dysphagia  8/12: POs better today; monitor as above  8-14: Continues to improve; wife concerned regarding ongoing secretions and dysarthria, wants to discuss increasing steroid regimen with neurosurgery but does not want to involve Dr. Lanis  - Dr. Gillie called back to provide input; would not increase steroid regimen, no additional interventions offered.  Appreciate his expertise.   - 8-15: Finishing up Decadron  4 mg twice daily; will provide gradual taper of 2 mg twice daily for 2 days and 1 mg twice daily for 1 day prior to discharge  20. Occasional SOB and Anxiety, ?vertigo/fear of falling   -Suspect most likely anxiety but secretions could be contributing. Will check CXR-negative for acute changes, Consider prn anxiety medication    -start xanax  0.25 PRN, discussed with his wife  - 27/10 patient's wife would like to  have option for 0.125 dose of Xanax  as needed.  Order adjusted   -Schedule for neuropscyh- next available --discussed patient case with Dr. Corina 8-11  - 8/12: Recurrent, associated with bradycardia and HTN; See #4, #14 above. Also with bed positional changes. May benefit from Buspar trial, family wishes to defer at this time. Offered atarax as alternative to xanax , family defers at this time. Discussed limitations of audiology consult for vertigo; offered COR testing however patient has already seen ENT for OP evaluation for this.   8-13: No intervention per EP, still occurring primarily with head going back; resolves quickly with use of incentive spirometer  --See #4 above.   LOS: 9 days A FACE TO FACE EVALUATION WAS PERFORMED  Aditya Nastasi P Seniyah Esker 12/07/2023, 1:24 PM

## 2023-12-07 NOTE — Progress Notes (Signed)
 Speech Language Pathology Daily Session Note  Patient Details  Name: Zachary Underwood MRN: [id number] Date of Birth: 09/10/1927  Today's Date: 12/07/2023 SLP Individual Time: 0729-0808 SLP Individual Time Calculation (min): 39 min  Short Term Goals: Week 2: SLP Short Term Goal 1 (Week 2): STGs=LTGs d/t ELOS  Skilled Therapeutic Interventions: SLP conducted skilled therapy session targeting swallowing and communication goals. Upon entry, patient on commode with nursing staff present. Resumed care from nursing staff. Patient was continent of bowel and bladder, then ambulated to chair using RW with supervision. Patient then consumed Dys3/thin liquid diet items on breakfast tray during which SLP conducted skilled assessment of diet tolerance. Of note, patient required supervision-min cues to utilize extra swallow and hard swallow strategies, and thus intermittently exhibited gurgling and/or throat clear during meal. Recommend continuation of current diet with medications crushed in puree. Per discussion with patient's wife during family education in previous session, SLP provided patient with handout outlining home exercise program for speech and swallowing exercises. In remaining minutes of session, SLP reviewed overarticulation strategy with patient and patient benefited from min assist to utilize during newspaper reading task at the paragraph reading level. Patient was left in room with call bell in reach and alarm set. SLP will continue to target goals per plan of care.        Pain Pain Assessment Pain Scale: 0-10 Pain Score: 0-No pain  Therapy/Group: Individual Therapy  Makyia Erxleben, M.A., CCC-SLP  Wilmore Holsomback A Oluchi Pucci 12/07/2023, 8:11 AM

## 2023-12-08 MED ORDER — POLYETHYLENE GLYCOL 3350 17 G PO PACK: 17.0000 g | PACK | Freq: Every day | ORAL | Status: DC | PRN

## 2023-12-08 MED ORDER — SENNOSIDES-DOCUSATE SODIUM 8.6-50 MG PO TABS: 1.0000 | ORAL_TABLET | Freq: Every evening | ORAL | Status: DC | PRN

## 2023-12-08 MED ORDER — LOPERAMIDE HCL 2 MG PO CAPS
2.0000 mg | ORAL_CAPSULE | ORAL | Status: DC | PRN
  Administered 2023-12-08: 2 mg via ORAL
  Filled 2023-12-08: qty 1

## 2023-12-08 NOTE — Plan of Care (Signed)
  Problem: Consults Goal: RH GENERAL PATIENT EDUCATION Description: See Patient Education module for education specifics. Outcome: Progressing   Problem: RH BOWEL ELIMINATION Goal: RH STG MANAGE BOWEL WITH ASSISTANCE Description: STG Manage Bowel with mod I Assistance. Outcome: Progressing   Problem: RH BLADDER ELIMINATION Goal: RH STG MANAGE BLADDER WITH ASSISTANCE Description: STG Manage Bladder With mod I Assistance Outcome: Progressing   Problem: RH SKIN INTEGRITY Goal: RH STG SKIN FREE OF INFECTION/BREAKDOWN Description: [place] skin free of infection/breakdown with mod I assistance Outcome: Progressing   Problem: RH SAFETY Goal: RH STG ADHERE TO SAFETY PRECAUTIONS W/ASSISTANCE/DEVICE Description: STG Adhere to Safety Precautions With Assistance/Device. Outcome: Progressing   Problem: RH PAIN MANAGEMENT Goal: RH STG PAIN MANAGED AT OR BELOW PT'S PAIN GOAL Description: <4 w/ prns Outcome: Progressing   Problem: RH KNOWLEDGE DEFICIT GENERAL Goal: RH STG INCREASE KNOWLEDGE OF SELF CARE AFTER HOSPITALIZATION Description: Manage increase knowledge of self care after hospitalization with mod I assistance using educational materials provided from wife  Outcome: Progressing

## 2023-12-08 NOTE — Progress Notes (Signed)
 RN calling regarding 4 loose stools tonight. No other abnormal symptoms or vitals. Requesting Imodium . Changed mralax and senokot to PRN, and added Imodium  2mg  q4h prn.  [employer] Prapti Grussing PA-C  12/08/23 @2218 

## 2023-12-08 NOTE — Progress Notes (Signed)
 PROGRESS NOTE   Subjective/Complaints: No new complaints this morning Would like to walk, messaged Tomeka to please walk with him if she has time Grounds pass ordered  ROS: Denies fevers, chills, N/V, abdominal pain, constipation, diarrhea, chest pain, new weakness or paraesthesias.   + Dysphagia/dysarthria--improving + Intermittent shortness of breath and anxiety- ongoing, with reclining, and elevating bed + Neck itching  Objective:   No results found.  No results for input(s): WBC, HGB, HCT, PLT in the last 72 hours.  No results for input(s): NA, K, CL, CO2, GLUCOSE, BUN, CREATININE, CALCIUM  in the last 72 hours.   Intake/Output Summary (Last 24 hours) at 12/08/2023 1115 Last data filed at 12/08/2023 0730 Gross per 24 hour  Intake 840 ml  Output 800 ml  Net 40 ml        Physical Exam: Vital Signs Blood pressure (!) 154/72, pulse 64, temperature 98 F (36.7 C), temperature source Oral, resp. rate 18, height 5' 8 (1.727 m), weight 81.1 kg, SpO2 100%.    General: No apparent distress.  Ambulating with rolling walker with CGA. HEENT: EOMI.  PERRLA.  No nystagmus.  Head is normocephalic. C-spine brace in place Neck: Supple without JVD or lymphadenopathy.  C-collar intact.   Heart:   Bradycardia, irregular rhythm.  + systolic ejection murmur Chest: CTA bilaterally without wheezes, rales, or rhonchi; no distress.     Abdomen: Soft, non-tender, non-distended, bowel sounds positive. Extremities:    trace+ bilateral lower extremity edema Psych: Pt's affect is appropriate. Pt is cooperative.  Skin: Clean and intact without signs of breakdown  + L shoulder - Chronic Lipoma--stable  + Right anterior scalp laceration --stable--under mepilex dressing, c/d No apparent rash, erythema around itching site around chin, collar  PRIOR EXAMS: Neuro:   Mental Status: Awake, alert; oriented to self, place,  and time. No obvious cognitive deficits.   Speech/Languate: Intact, mild dysarthria.  100% intelligible. CRANIAL NERVES: 2 through 12 grossly intact   MOTOR: Moving all 4 limbs antigravity, against resistance in bed.  Strength: 4+/5 throughout bilateral upper and lower extremities     SENSORY: Normal to touch all 4 extremities.     Coordination: No ataxia, no tremor  Gait: Ambulates with a rolling walker at supervision level, stable stance and stride   MSK: Right shoulder abduction limited to less than 90 degrees--unchanged  Assessment/Plan: 1. Functional deficits which require 3+ hours per day of interdisciplinary therapy in a comprehensive inpatient rehab setting. Physiatrist is providing close team supervision and 24 hour management of active medical problems listed below. Physiatrist and rehab team continue to assess barriers to discharge/monitor patient progress toward functional and medical goals  Care Tool:  Bathing    Body parts bathed by patient: Right arm, Left arm, Abdomen, Chest, Front perineal area, Buttocks, Right upper leg, Left upper leg, Face, Right lower leg, Left lower leg   Body parts bathed by helper: Right lower leg, Left lower leg     Bathing assist Assist Level: Supervision/Verbal cueing     Upper Body Dressing/Undressing Upper body dressing   What is the patient wearing?: Pull over shirt    Upper body assist Assist Level: Supervision/Verbal cueing  Lower Body Dressing/Undressing Lower body dressing    Lower body dressing activity did not occur: Refused What is the patient wearing?: Pants     Lower body assist Assist for lower body dressing: Supervision/Verbal cueing     Toileting Toileting    Toileting assist Assist for toileting: Supervision/Verbal cueing     Transfers Chair/bed transfer  Transfers assist     Chair/bed transfer assist level: Supervision/Verbal cueing     Locomotion Ambulation   Ambulation assist       Assist level: Supervision/Verbal cueing Assistive device: Rollator Max distance: 200   Walk 10 feet activity   Assist     Assist level: Supervision/Verbal cueing Assistive device: Rollator   Walk 50 feet activity   Assist    Assist level: Supervision/Verbal cueing Assistive device: Rollator    Walk 150 feet activity   Assist    Assist level: Supervision/Verbal cueing Assistive device: Rollator    Walk 10 feet on uneven surface  activity   Assist     Assist level: Minimal Assistance - Patient > 75% Assistive device: Walker-rolling   Wheelchair     Assist Is the patient using a wheelchair?: Yes Type of Wheelchair: Manual      Max wheelchair distance: 150'    Wheelchair 50 feet with 2 turns activity    Assist        Assist Level: Dependent - Patient 0%   Wheelchair 150 feet activity     Assist      Assist Level: Dependent - Patient 0%   Blood pressure (!) 154/72, pulse 64, temperature 98 F (36.7 C), temperature source Oral, resp. rate 18, height 5' 8 (1.727 m), weight 81.1 kg, SpO2 100%.  Medical Problem List and Plan: 1. Functional deficits secondary to type II posterior displaced odontoid fracture after a fall             -patient may shower; maintain cervical collar 24/7             -ELOS/Goals: 5-7 days, PT/OT/SLP Mod I -- 8/20 DC date             -Pts wife would like to be notified before any medication changes  Continue CIR    - 8/7: Dr. Burnetta with ortho spine provided second opinion; recommendations  - Hard cervical collar at all times.  - reevaluate the fracture with x-rays in 4 weeks. - - Recommend he continue to use Celebrex  and muscle relaxer as this is controlling his pain.  - follow-up with me in 4 weeks.  - 8/7: Provided list of PRN medications to patient  - 8/12: Reported caregiver at home was bathing and dressing him. Currently full SPV most ADLs, limited by SOB/symptomatic bradycardia episodes when  mobilized--goals Mod I. SPV bed mobility/transfers, CGA with rollator 150 ft. Goals SPV. Limited by endurance. 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   - Family meeting with patient, wife, nursing admin, hospital admin, Architectural technologist, and medical team  today to discuss patient care -- provided appropriate boundaries with staff (ex. Not following nurses in hallway), reasonable expectations for medical updates, and patient advocacy - provided physician email and office # and discussed physician schedule, NP availability and limitations for medical updates/communication. Wife, team in agreement on minimizing stress with nursing surrounding attending to the patient and his needs. Emphasized OK to seek urgent care if emergency is suspected.    -  Will get nursing to provide call  button due to difficulty finding call bell with C collar   - Due to anxiety and fear of falling exacerbated by posterior lean, along with ongoing surgical precautions with high risk of odontoid displacement with malpositioning, feel patient would benefit from a hospital bed to facilitate head elevation and improve bed mobility and independence.  2.  Impaired mobility: messaged Tomeka to please walk with him if she has time -DVT/anticoagulation:  Mechanical: continue Sequential compression devices, below knee Bilateral lower extremities Pharmaceutical: Xarelto              -antiplatelet therapy: N/A  3. Pain Management: continue Celebrex  (monitor Cr) Robaxin , oxycodone  as needed             - Left knee pain-received cortisone injection by orthopedics  - 8-8: Switch to tramadol  50 mg for moderate pain and oxycodone  2.5 mg for severe pain every 6 hours as needed.  Continue Robaxin , Celebrex .  Nursing to inquire about location of cold packs patient had on prior floor.  8/9: Cold therapy Prn for shoulders, head  8/10 discussed with patient's wife, patient does not ask for tramadol  as  needed.  Will schedule twice a day--tolerating  8-11: Left-sided headache.  Patient refusing ice packs.  Per report, tramadol  not effective; changed to as needed.  Patient has Celebrex , oxycodone  available.  Allergic to Tylenol, could consider Topamax but cautious to oversedate.  Continue current regimen for now.  8/12: D/w wife, requests resumption of scheduled celebrex  200 mg BID understanding may result in recurrent AKI. He has been tolerating once daily up to this point - will trial and check labs Thursday.  - DC tramadol .  - Discussed Fioricet, Journavix as alternative PRNS for headache-- family wishes to hold off at this time.   8/13: minimal pain on this regimen; denies any headaches 8-14: Renal function is tolerating Celebrex ; continue  4. Mood/Behavior/Sleep: LCSW to follow for evaluation and support when available.              -antipsychotic agents: N/A  - 8-8.  Benadryl  25 mg nightly as needed for sleep; per wife, patient uses this at home--has not requested, sleeping well per nursing  - 8/12: 1x PRN 0.125 xanax  today with symptomatic improvement but significant lethargy; maintain minimal dosing only, remove 0.25 PRN option and reduce to BID PRN  8/14: Not requiring Xanax  currently; will monitor and potentially DC before the weekend  8-15: 4 episodes of anxiety today, family requesting more frequent Xanax  since he is tolerating it well; emphasized this will need to be picked up by PCP at discharge if he is can to continue, changed to every 2 hours as needed, continue Xanax   5. Neuropsych/cognition: This patient is capable of making decisions on his own behalf.  6. Skin/Wound Care: routine pressure relief measures -[place] J collar in place, monitor for changes in character of skin  -scalp laceration present on admission --stable appearance - 8-15: Complaints of itching under cervical collar. Continue Eucerin/hydrocortisone cream 1-1  7. Fluids/Electrolytes/Nutrition: monitor I/O and  repeat labs in a.m.              - Dysphagia 3 with nectar thick liquids--continue aspiration precautions. Continue SLP   - 8/8: Eating 50 to 100% of meals; on  Free water protocol for hydration.  Albumin 2.9, will encourage Zachary.o. intakes, appreciate nutrition assistance.  8-11: Labs stable.  Albumin improved from 2.9-3.2.  Cleared for thin fluids today.  8/12: patient refused all meals 8/11; 100% breakfast this AM.  Will monitor today - if continues to be poor will get nutritional consult  8-13: Zachary.o. intakes picked up , okay to monitor for now  8. HTN: resumed home irbesartan --no BB due to bradycardia  - 8-8: Add as needed hydralazine  10 milligrams every 6 hours for SBP greater than 160, DBP greater than 100.  Increased blood pressure likely due to pain, monitor today may increase irbesartan  if renal function improves tomorrow.  - BP a little labile, continue to monitor trend and continue current medications  8/11: HTN with therapies, bradycardia - ?contribution of pain/headache vs. Anxiety. Self-resolves once calm.   8/13: HTN 140-160s; increase metolazone  to 5 mg daily--improved    12/08/2023    5:00 AM 12/08/2023    3:47 AM 12/07/2023    7:00 PM  Vitals with BMI  Weight 178 lbs 13 oz    BMI 27.19    Systolic  154 154  Diastolic  72 85  Pulse  64 66    9.  Chronic diastolic CHF/chronic lower extremity edema: patient's wife requested metolazone  and not Demadex .              -Currently on metolazone  2.5 mg daily, BNP 261-- wife says this CAN NOT BE REMOVED   - Daily weights and volume assessments.  - 8-11: Volume assessments and symptoms without signs of volume overload; chest x-ray 8-9 with improved pulmonary edema.  Weights downtrending while albumin is improving.  Monitor.  8/12: Stable cardiomegaly, pleural effusions on CXR--no external s/s volume overload; trend weights. May need to increase metolazone .   8/13: Weights uptrending, BP elevated - increase metolazone  to 5 mg daily;  already discussed as potential with patient's wife, informed nursing to notify family when they get in.  Weight reviewed and is stable  Filed Weights   12/06/23 0700 12/07/23 0500 12/08/23 0500  Weight: 81.4 kg 80.1 kg 81.1 kg    10. HLD: continue home atorvastatin   11.  Mild anemia: Continue to monitor CBC recheck labs in a.m.  - 8-11 hemoglobin uptrending 11.4--12.0 on 8-14  12.  AKI: Creatinine 1.38 on admission baseline approximately 1.0--trend creatinine. Last Cr 1.52. Recheck tomorrow.  - 8-8: Creatinine 1.5, BUN slightly increased.  Plan for IV fluids very gentle normal saline at 50 cc/h for 10 hours.  Repeat BMP in AM.  Did discuss with wife that if AKI does not improve, may need to adjust scheduled Celebrex  to as needed.  -8/9 Cr a little better at 1.51, change celebrex  to PRN  -8/11 - BUN improving, Cr normalized - continue current regimen     13. Obesity             Body mass index is 30.3 kg/m.  14. Bradycardia/atrial fibrillation             -Pt was evaluated by cardiology, no indication for temporary pacing   - Regular rate  -8/9 HR stable currently, nursing reports occasional bradycardic episodes at night, pt was evaluated by cardiology for this during acute admission  8-11: Reported episode of bradycardia 30s to 50s during therapies today; self resolved, patient asymptomatic.  If recurs again, will get EKG.  8/12: 2x episode SOB and bradycardia 30-50s with therapy this AM; EKG showed ?a flutter with ventricular rate 40s. Re-consulted cardiology for symptomatic bradycardia, added PRN atropine  0.5 mg IV (for use with rapid response if needed); not candidate for pacemake with last evaluation 8/13: Per EP: If he has episodes of syncope related to bradycardia, pacemaker implant  would be reasonable. As he does have a c-collar in place, we will hold off for now. He can follow-up with general cardiology. --No recurrence 12-04-13  15. Hx of aortic aneurysm/dissection -f/u  outpatient, BP control   -Blood pressure management as above, has as needed hydralazine   16. Aortic stenosis             -Repeat echo 6-12 months has been recommended  17. L knee pain             -L knee injection completed by ortho 8/1--no current complaints of pain  18.  Right shoulder pain--no complaints on exam 8-11  19.  Dysphagia/secretion management  - Mucinex  600 mg twice daily  - Robitussin DM as needed  - DC Claritin  10 mg daily  -Was placed on Unasyn  --> Augmentin  for suspected aspiration, chest x-ray 8-9 improved without opacity; finish antibiotics at 10 days total  -8-8: Patient managing secretions well on current exam, no current rhonchi or wheezing, no concerns for aspiration.  Monitor oxygen and respiratory status closely.  Can add scopolamine patch if needed, but would hold off due to multiple anticholinergic medications unless absolutely needed.  -8/9  Restart claritin -has allergies hx, discussed increased use of suction  -8/10 nasal congestion no better--Flonase  BID started 8/8  8-11: MBS today with pill dysphagia, otherwise cleared for thin liquids.  Possible prevertebral edema noted at C2, discussed with Dr. Georgina with orthopedics, will start Decadron  4 mg twice daily x 5 days with possible taper to see if this improves dysphagia  8/12: POs better today; monitor as above  8-14: Continues to improve; wife concerned regarding ongoing secretions and dysarthria, wants to discuss increasing steroid regimen with neurosurgery but does not want to involve Dr. Lanis  - Dr. Gillie called back to provide input; would not increase steroid regimen, no additional interventions offered.  Appreciate his expertise.   - 8-15: Finishing up Decadron  4 mg twice daily; will provide gradual taper of 2 mg twice daily for 2 days and 1 mg twice daily for 1 day prior to discharge  20. Occasional SOB and Anxiety, ?vertigo/fear of falling   -Suspect most likely anxiety but secretions could be  contributing. Will check CXR-negative for acute changes, Consider prn anxiety medication    -start xanax  0.25 PRN, discussed with his wife  - 54/10 patient's wife would like to have option for 0.125 dose of Xanax  as needed.  Order adjusted   -Schedule for neuropscyh- next available --discussed patient case with Dr. Corina 8-11  - 8/12: Recurrent, associated with bradycardia and HTN; See #4, #14 above. Also with bed positional changes. May benefit from Buspar trial, family wishes to defer at this time. Offered atarax as alternative to xanax , family defers at this time. Discussed limitations of audiology consult for vertigo; offered COR testing however patient has already seen ENT for OP evaluation for this.   8-13: No intervention per EP, still occurring primarily with head going back; resolves quickly with use of incentive spirometer  --See #4 above.   LOS: 10 days A FACE TO FACE EVALUATION WAS PERFORMED  Zachary Underwood Zachary Underwood 12/08/2023, 11:15 AM

## 2023-12-09 LAB — CBC
HCT: 39.2 % (ref 39.0–52.0)
Hemoglobin: 13 g/dL (ref 13.0–17.0)
MCH: 30.4 pg (ref 26.0–34.0)
MCHC: 33.2 g/dL (ref 30.0–36.0)
MCV: 91.8 fL (ref 80.0–100.0)
Platelets: 228 K/uL (ref 150–400)
RBC: 4.27 MIL/uL (ref 4.22–5.81)
RDW: 14.7 % (ref 11.5–15.5)
WBC: 12 K/uL — ABNORMAL HIGH (ref 4.0–10.5)
nRBC: 0 % (ref 0.0–0.2)

## 2023-12-09 LAB — BASIC METABOLIC PANEL WITH GFR
Anion gap: 9 (ref 5–15)
BUN: 49 mg/dL — ABNORMAL HIGH (ref 8–23)
CO2: 23 mmol/L (ref 22–32)
Calcium: 9.6 mg/dL (ref 8.9–10.3)
Chloride: 106 mmol/L (ref 98–111)
Creatinine, Ser: 1.21 mg/dL (ref 0.61–1.24)
GFR, Estimated: 55 mL/min — ABNORMAL LOW (ref >60)
Glucose, Bld: 110 mg/dL — ABNORMAL HIGH (ref 70–99)
Potassium: 4.2 mmol/L (ref 3.5–5.1)
Sodium: 138 mmol/L (ref 135–145)

## 2023-12-09 MED ORDER — CELECOXIB 100 MG PO CAPS
200.0000 mg | ORAL_CAPSULE | Freq: Every day | ORAL | Status: DC
  Administered 2023-12-10: 200 mg via ORAL
  Filled 2023-12-09: qty 2

## 2023-12-09 NOTE — Progress Notes (Signed)
 Speech Language Pathology Daily Session Note  Patient Details  Name: Zachary Underwood MRN: [id number] Date of Birth: 17-Aug-1927  Today's Date: 12/09/2023 SLP Individual Time: 1110-1200 SLP Individual Time Calculation (min): 50 min  Short Term Goals: Week 2: SLP Short Term Goal 1 (Week 2): STGs=LTGs d/t ELOS  Skilled Therapeutic Interventions:  Pt initially unable to participate d/t fatigue, however, SLP was able to switch pt schedules. Upon SLP return, he was awake and participative throughout tx tasks targeting dysphagia and speech production. IOPI completed w/ minA cues to ensure adequate technique. He initially completed 10 reps in the anterior and posterior position @ 22 kpa. Resistance was then increased to 27 kpa and he was able to complete 20 reps in the anterior and posterior position. Due to notably increased success, reassessment was completed and anterior strength averaged @ 33 kpa. Also introduced EMST via [id number]. He was able to complete 25 reps @ 5 cmH2O w/ modA cues to ensure adequate technique. During sentence level repetitive phoneme tasks, he demonstrated increased success w/ g > k. Anticipate d/t voiced vs voiceless phonemes. Overall, he benefited from minA cues for articulatory precision to maintain 95% intelligibility. At the end of tx tasks, he was left in his recliner with his call light within reach. Recommend cont ST per POC.    Pain Pain Assessment Pain Scale: 0-10 Pain Score: 4  Pain Type: Acute pain Pain Location: Head Pain Orientation: Posterior  Therapy/Group: Individual Therapy  [name] 12/09/2023, 11:24 AM

## 2023-12-09 NOTE — Plan of Care (Signed)
  Problem: Consults Goal: RH GENERAL PATIENT EDUCATION Description: See Patient Education module for education specifics. Outcome: Progressing   Problem: RH BOWEL ELIMINATION Goal: RH STG MANAGE BOWEL WITH ASSISTANCE Description: STG Manage Bowel with mod I Assistance. Outcome: Progressing   Problem: RH BLADDER ELIMINATION Goal: RH STG MANAGE BLADDER WITH ASSISTANCE Description: STG Manage Bladder With mod I Assistance Outcome: Progressing   Problem: RH SKIN INTEGRITY Goal: RH STG SKIN FREE OF INFECTION/BREAKDOWN Description: [place] skin free of infection/breakdown with mod I assistance Outcome: Progressing   Problem: RH SAFETY Goal: RH STG ADHERE TO SAFETY PRECAUTIONS W/ASSISTANCE/DEVICE Description: STG Adhere to Safety Precautions With Assistance/Device. Outcome: Progressing   Problem: RH PAIN MANAGEMENT Goal: RH STG PAIN MANAGED AT OR BELOW PT'S PAIN GOAL Description: <4 w/ prns Outcome: Progressing   Problem: RH KNOWLEDGE DEFICIT GENERAL Goal: RH STG INCREASE KNOWLEDGE OF SELF CARE AFTER HOSPITALIZATION Description: Manage increase knowledge of self care after hospitalization with mod I assistance using educational materials provided from wife  Outcome: Progressing

## 2023-12-09 NOTE — Progress Notes (Signed)
 Physical Therapy Weekly Progress Note  Patient Details  Name: Zachary Underwood MRN: [id number] Date of Birth: [birth year]  Beginning of progress report period: November 29, 2023 End of progress report period: December 09, 2023  Today's Date: 12/09/2023 PT Individual Time: 8697-8587 PT Individual Time Calculation (min): 70 min   Patient has met 0 of 0 short term goals. Pt is progressing well toward mobility goals, improving independence with bed mobility, balance, transfers, and ambulation. Pt is performing all mobility as supervision level and is near goal level for upcoming discharge. Pt continues to have deficits in strength, balance, and endurance, and will continue to benefit from skilled PT.   Patient continues to demonstrate the following deficits muscle weakness, decreased cardiorespiratoy endurance, and decreased standing balance, decreased postural control, and decreased balance strategies and therefore will continue to benefit from skilled PT intervention to increase functional independence with mobility.  Patient progressing toward long term goals..  Continue plan of care.  PT Short Term Goals Week 1:  PT Short Term Goal 1 (Week 1): STGs = LTGs Week 2:  PT Short Term Goal 1 (Week 2): STGs = LTGs  Skilled Therapeutic Interventions/Progress Updates:  Ambulation/gait training;Community reintegration;DME/adaptive equipment instruction;Psychosocial support;Stair training;UE/LE Strength taining/ROM;Balance/vestibular training;Neuromuscular re-education;Discharge planning;Pain management;Therapeutic Activities;UE/LE Coordination activities;Functional mobility training;Patient/family education;Therapeutic Exercise   Pt received seated in recliner and agrees to therapy. No complaint of pain. Sit to stand with cues for initiation and sequencing. Pt ambulates to toilet with rollator and cues for positioning. Pt completes toileting and stands from toilet without assistance. Pt ambulates from room to  dayroom with rollator and cues for upright gaze to improve posture and balance, and increasing stride length and step height to decrease risk for falls. Pt takes extended seated rest break and then ambulates x175' with rollator and similar cues. Pt is noted to scuff heels on ground bilaterally during swing phase. PT educates on importance of increasing step height for improved gait pattern and to decrease risk for falls. Pt then completes 3x20 alternating foot taps on 10 step with BUE on rollator, and PT providing CGA and cues for posture and body mechanics. Extended seated rest break between each set. Pt ambulates additional x175' with rollator and improved step height. Seated rest break and then pt ambulates x100' back to room. Left seated with all needs within reach.   Therapy Documentation Precautions:  Precautions Precautions: Fall, Cervical Required Braces or Orthoses: Cervical Brace Cervical Brace: At all times, Hard collar Restrictions Weight Bearing Restrictions Per Provider Order: No   Therapy/Group: Individual Therapy  [name], PT, DPT 12/09/2023, 4:55 PM

## 2023-12-09 NOTE — Discharge Summary (Signed)
 Physician Discharge Summary  Patient ID: Zachary Underwood MRN: [id number] DOB/AGE: [birth year] 89 y.o.  Admit date: 11/28/2023 Discharge date: 12/11/2023  Discharge Diagnoses:  Principal Problem:   Cervical spine fracture [facility]) Active Problems:   Persistent atrial fibrillation (HCC)   Hyperlipidemia   Primary hypertension   Lower extremity edema   Normocytic anemia   Bradycardia   Chronic atrial fibrillation (HCC)   Chronic diastolic CHF (congestive heart failure) (HCC)   Left knee pain   Dysphagia   Anemia   Chronic diastolic congestive heart failure (HCC)   Discharged Condition: stable  Significant Diagnostic Studies: DG CHEST PORT 1 VIEW Result Date: 12/03/2023 CLINICAL DATA:  Shortness of breath. EXAM: PORTABLE CHEST 1 VIEW COMPARISON:  Radiograph 11/30/2023, CT 11/26/2023 FINDINGS: Prior median sternotomy. Stable cardiomegaly. Unchanged mediastinal contours. Small pleural effusions persist. Stable pulmonary vasculature. No acute airspace disease or pneumothorax. IMPRESSION: Stable cardiomegaly. Small pleural effusions. Electronically Signed   By: Andrea Gasman M.D.   On: 12/03/2023 10:07   DG Swallowing Func-Speech Pathology Result Date: 12/02/2023 Table formatting from the original result was not included. Modified Barium Swallow Study Patient Details Name: Zachary Underwood MRN: [id number] Date of Birth: [birth year] Today's Date: 12/02/2023 HPI/PMH: HPI: Zachary Underwood is an active, still-working 89 y.o. male who presented to the ED for evaluation after he fell and hit his head and neck. CT head and maxillofacial negative for acute changes. CT cervical spine: mildly displaced fractures through the posterior arch of C1 and nondisplaced fracture through the midline anterior arch of C1. MR cervical spine: Displaced type 2 odontoid fracture with significant posterior dislocation of the dens relative to the body of C2, osteophyte at C5-6. MBS 11/25/23 -dysphagia 3, nectar liquids with  precautions recommended. PMH: persistent atrial fibrillation on Xarelto , CVA, moderate aortic stenosis, s/p surgical repair of ascending thoracic aorta, HTN, HLD, BPH, chronic lower extremity edema. Clinical Impression: Clinical Impression: Pt presents w/ improved oropharyngeal dysphagia as compared to 8/4. However, mild dysphagia remains overall. Trace oral residue and some posterior lingual weakness remain. Pharyngeally, he demonstrates improved airway protection. Despite this; reduced tongue base retraction, hyolaryngeal excursion, and pharyngeal constriction continue to negatively impact swallow efficiency. Subsequently, mild scattered pharyngeal residue remains after the swallow. Liquid wash was effective to reduce phayrngeal residue after puree and solid trials. Penetration (PAS 2 w/ nectar and PAS 4 w/ thin) noted w/ thin and mildly thick consistencies, however this is well WFL for pt's age. The only instance of aspiration noted (PAS 7) occured during the pill trial. Similar to prev MBSS, the barium tablet became lodged in his valleculae. Cervical collar negatively impacted his ROM, however, a chin tuck, additoinal liquid washes, and pureed consistencies were ineffective to clear the tablet. It was only cleared when pt eventually coughed up and expectorated said tablet. Of note, some potential prevertebral edema noted @ level of C2, however, difficult to differentiate d/t cervical lordosis. Hypertrophy of the cricopharyngeus noted as well. Anticipate recent cervical fx, potential edema, and deconditioning exacerbated baseline mild structural abnormalities and presbyphagia that patient was able to compensate for prior to this admission. Recommend continued Dys 3 textures and meds crushed in puree. Upgrade to thin liquids. Intermittent supervision during meals. Pt should also continue w/ previously recommended effortful swallow and double swallow to assist w/ pharyngeal clearance. Factors that may increase risk  of adverse event in presence of aspiration [name] 2021): Factors that may increase risk of adverse event in presence of aspiration [name] 2021):  Frail or deconditioned Recommendations/Plan: Swallowing Evaluation Recommendations Swallowing Evaluation Recommendations Recommendations: PO diet PO Diet Recommendation: Dysphagia 3 (Mechanical soft); Thin liquids (Level 0) Liquid Administration via: Straw; Cup Medication Administration: Crushed with puree Supervision: Patient able to self-feed; Intermittent supervision/cueing for swallowing strategies Swallowing strategies  : Slow rate; Small bites/sips; effortful swallow; Multiple dry swallows after each bite/sip; Follow solids with liquids; Clear throat intermittently Postural changes: Stay upright 30-60 min after meals; Position pt fully upright for meals Oral care recommendations: Oral care BID (2x/day) Caregiver Recommendations: Have oral suction available Treatment Plan Treatment Plan Treatment recommendations: Therapy as outlined in treatment plan below Follow-up recommendations: Outpatient SLP Functional status assessment: Patient has had a recent decline in their functional status and demonstrates the ability to make significant improvements in function in a reasonable and predictable amount of time. Treatment frequency: Min 5x/week Treatment duration: 2 weeks Interventions: Oropharyngeal exercises; Patient/family education; Diet toleration management by SLP; Compensatory techniques; Aspiration precaution training; Respiratory muscle strength training Recommendations Recommendations for follow up therapy are one component of a multi-disciplinary discharge planning process, led by the attending physician.  Recommendations may be updated based on patient status, additional functional criteria and insurance authorization. Assessment: Orofacial Exam: Orofacial Exam Oral Cavity: Oral Hygiene: WFL Oral Cavity - Dentition: Adequate natural dentition  Orofacial Anatomy: WFL Oral Motor/Sensory Function: WFL Anatomy: Anatomy: Prominent cricopharyngeus; Other (Comment); Suspected cervical osteophytes (cervical lordosis) Boluses Administered: Boluses Administered Boluses Administered: Thin liquids (Level 0); Mildly thick liquids (Level 2, nectar thick); Puree; Solid  Oral Impairment Domain: Oral Impairment Domain Lip Closure: No labial escape Tongue control during bolus hold: Posterior escape of less than half of bolus Bolus preparation/mastication: Timely and efficient chewing and mashing Bolus transport/lingual motion: Repetitive/disorganized tongue motion Oral residue: Trace residue lining oral structures Location of oral residue : Tongue Initiation of pharyngeal swallow : Posterior laryngeal surface of the epiglottis; Pyriform sinuses  Pharyngeal Impairment Domain: Pharyngeal Impairment Domain Soft palate elevation: No bolus between soft palate (SP)/pharyngeal wall (PW) Laryngeal elevation: Partial superior movement of thyroid cartilage/partial approximation of arytenoids to epiglottic petiole Anterior hyoid excursion: Partial anterior movement Epiglottic movement: Partial inversion Laryngeal vestibule closure: Incomplete, narrow column air/contrast in laryngeal vestibule Pharyngeal stripping wave : Present - diminished Pharyngeal contraction (A/P view only): N/A Pharyngoesophageal segment opening: Partial distention/partial duration, partial obstruction of flow Tongue base retraction: Narrow column of contrast or air between tongue base and PPW Pharyngeal residue: Collection of residue within or on pharyngeal structures Location of pharyngeal residue: Valleculae; Pharyngeal wall; Tongue base; Pyriform sinuses; Diffuse (>3 areas)  Esophageal Impairment Domain: No data recorded Pill: Pill Consistency administered: Thin liquids (Level 0); Puree Thin liquids (Level 0): Impaired (see clinical impressions) Puree: Impaired (see clinical impressions)  Penetration/Aspiration Scale Score: Penetration/Aspiration Scale Score 1.  Material does not enter airway: Puree; Solid 2.  Material enters airway, remains ABOVE vocal cords then ejected out: Mildly thick liquids (Level 2, nectar thick) 4.  Material enters airway, CONTACTS cords then ejected out: Thin liquids (Level 0) 7.  Material enters airway, passes BELOW cords and not ejected out despite cough attempt by patient: Pill; Thin liquids (Level 0) (thin liquids during pill trial only) Compensatory Strategies: Compensatory Strategies Compensatory strategies: Yes Straw: Effective Effective Straw: Thin liquid (Level 0) Multiple swallows: Effective Effective Multiple Swallows: Thin liquid (Level 0); Mildly thick liquid (Level 2, nectar thick); Puree; Solid Chin tuck: Ineffective Ineffective Chin Tuck: Pill Liquid wash: Effective Effective Liquid Wash: Puree; Solid   General Information: Caregiver present: No  Diet Prior to this Study: Dysphagia 3 (mechanical soft); Mildly thick liquids (Level 2, nectar thick)   Temperature : Normal   Respiratory Status: WFL   Supplemental O2: None (Room air)   History of Recent Intubation: No  Behavior/Cognition: Alert; Cooperative Self-Feeding Abilities: Able to self-feed Baseline vocal quality/speech: Abnormal resonance Volitional Cough: Able to elicit Volitional Swallow: Able to elicit Exam Limitations: No limitations Goal Planning: Prognosis for improved oropharyngeal function: Good Barriers to Reach Goals: Severity of deficits; Other (Comment) Barriers/Prognosis Comment: deconditioned state Patient/Family Stated Goal: improve dysphagia Consulted and agree with results and recommendations: Patient Pain: Pain Assessment Pain Assessment: Faces Faces Pain Scale: 6 Pain Location: headache - L side Pain Intervention(s): Monitored during session; Premedicated before session End of Session: Start Time:No data recorded Stop Time: No data recorded Time Calculation:No data recorded Charges: No  data recorded SLP visit diagnosis: SLP Visit Diagnosis: Dysphagia, oropharyngeal phase (R13.12) Past Medical History: Past Medical History: Diagnosis Date  Arthritis   Cardiac arrhythmia   Hypertension   Ruptured aortic aneurysm (HCC)   Stroke (HCC) 08/23/2021 Past Surgical History: Past Surgical History: Procedure Laterality Date  open heart surgery    ruptured aortic aneurysm repair   Zachary Underwood 12/02/2023, 12:10 PM  DG Chest 2 View Result Date: 11/30/2023 CLINICAL DATA:  Shortness of breath. EXAM: CHEST - 2 VIEW COMPARISON:  Radiograph 11/25/2023, CT 11/26/2023 FINDINGS: Prior median sternotomy. Cardiomegaly is stable. Mediastinal contours are unchanged. Small bilateral pleural effusions again seen. Improvement in pulmonary edema. No confluent opacity or pneumothorax. IMPRESSION: 1. Improvement in pulmonary edema. 2. Small bilateral pleural effusions. 3. Stable cardiomegaly. Electronically Signed   By: Andrea Gasman M.D.   On: 11/30/2023 11:16   DG Cervical Spine 2 or 3 views Result Date: 11/28/2023 CLINICAL DATA:  Fracture. EXAM: CERVICAL SPINE - 2-3 VIEW COMPARISON:  CT 11/22/2023 FINDINGS: Displaced dens fracture with 12 mm posterior displacement of the proximal fracture fragment. No significant change in alignment from prior exam. The C1 fractures on prior CT are not well demonstrated by radiograph. Similar anterolisthesis of C4 on C5 and C5 on C6. Prevertebral soft tissue thickening is seen at the fracture site. AP views are limited due to osseous overlap. IMPRESSION: 1. Displaced dens fracture with 12 mm posterior displacement of the proximal fracture fragment. No significant change in alignment from prior exam. 2. C1 fractures on prior CT are not well demonstrated by radiograph. Electronically Signed   By: Andrea Gasman M.D.   On: 11/28/2023 14:24   CT Angio Chest Pulmonary Embolism (PE) W or WO Contrast Addendum Date: 11/26/2023 ADDENDUM REPORT: 11/26/2023 19:18 ADDENDUM: The previously seen  bilobed fatty lesion in the left pectoral area is similar to prior CT. Follow-up as per recommendation of prior CT. Electronically Signed   By: Vanetta Chou M.D.   On: 11/26/2023 19:18    DG Knee Complete 4 Views Left Result Date: 11/26/2023 CLINICAL DATA:  Left knee pain. EXAM: LEFT KNEE - COMPLETE 4+ VIEW COMPARISON:  None Available. FINDINGS: Moderate medial tibiofemoral joint space narrowing. Mild tricompartmental peripheral spurring. The bones are subjectively under mineralized. No evidence of fracture, erosion or focal bone abnormality. No significant joint effusion. Peripheral vascular calcifications are seen. IMPRESSION: Mild tricompartmental osteoarthritis, most prominent in the medial tibiofemoral compartment. Electronically Signed   By: Andrea Gasman M.D.   On: 11/26/2023 13:23   MR BRAIN WO CONTRAST Result Date: 11/26/2023 EXAM: MRI BRAIN WITHOUT CONTRAST 11/26/2023 03:40:00 AM TECHNIQUE: Multiplanar multisequence MRI of the head/brain  was performed without the administration of intravenous contrast. COMPARISON: 07/24/2023 brain MRI 11/22/2023 CT cervical spine CLINICAL HISTORY: Neuro deficit, acute, stroke suspected. FINDINGS: BRAIN AND VENTRICLES: Old left cerebellar infarct. No acute infarct. No intracranial hemorrhage. No mass. No midline shift. No hydrocephalus. The sella is unremarkable. Normal flow voids. Minimal nonspecific white matter T2-weighted signal hyperintensities, which may be associated with early chronic small vessel disease or migraine headaches. Normal volume loss. ORBITS: Ocular lens replacements. No acute abnormality. SINUSES AND MASTOIDS: No acute abnormality. BONES AND SOFT TISSUES: Known C2 fracture with posterior displacement of the dens relative to the C2 body. No acute soft tissue abnormality. IMPRESSION: 1. No acute intracranial abnormality. 2. Old left cerebellar infarct. 3. Minimal nonspecific white matter T2-weighted signal hyperintensities, possibly related to  chronic small vessel disease 4. Known C2 fracture Electronically signed by: Franky Stanford MD 11/26/2023 03:47 AM EDT RP Workstation: [id number]   DG CHEST PORT 1 VIEW Result Date: 11/25/2023 CLINICAL DATA:  Shortness of breath EXAM: PORTABLE CHEST 1 VIEW COMPARISON:  11/22/2023 FINDINGS: artifact from presumed breathing mask projects over the lung apices. Patient rotated left. Moderate cardiomegaly. Median sternotomy. Atherosclerosis in the transverse aorta. No pleural effusion or pneumothorax. Chronic interstitial coarsening is nonspecific in this age group. No lobar consolidation. IMPRESSION: Cardiomegaly with chronic interstitial coarsening, nonspecific. Cannot exclude mild pulmonary venous congestion. Aortic Atherosclerosis (ICD10-I70.0). Electronically Signed   By: Rockey Kilts M.D.   On: 11/25/2023 08:27   MR Cervical Spine Wo Contrast Addendum Date: 11/22/2023 **ADDENDUM #1 *EXAM: MRI CERVICAL SPINE WITHOUT CONTRAST 11/22/2023 04:20:45 PM TECHNIQUE: Multiplanar multisequence MRI of the cervical spine was performed without the administration of intravenous contrast. COMPARISON: CT cervical spine earlier same day. CLINICAL HISTORY: Better assess seen fracture on CT as well as the hematoma. Pt given morphine  before exam. Difficult time laying flat, in a lot of pain. Propeller sequences ran as repeats. Best images possible. FINDINGS: BONES AND ALIGNMENT: Displaced type 2 odontoid fracture with significant posterior dislocation of the dens relative to the body of C2, similar to the earlier CT. Edema and possible blood products are interposed between the dens and posterior superior body of C2. Displacement of the fracture site indents the ventral thecal sac at C1-2. Vertebral body heights are otherwise maintained. No suspicious osseous lesion. SPINAL CORD: Flattening of the ventral cervical cord without evidence of compression. CSF flow artifact along the ventral aspect of the cervical cord at C1-2, compatible  with significant stenosis. SOFT TISSUES: Significant prevertebral soft tissue swelling extending from the skull base to the level of C7-T1. Edema within the paraspinal musculature extending from the skull base to the level of C6. C2-C3: Additional disruption of the posterior longitudinal ligament along the posterior aspect of C2. Possible disruption of the ligamentum flavum at C2-3 and C3-4. Edema along the posterior atlantoaxial membrane suggestive of ligamentous injury. C3-C4: Possible disruption of the ligamentum flavum at C3-4. C4-C5: No significant disc herniation. No spinal canal stenosis or neural foraminal narrowing. C5-C6: Disc osteophyte complex which indents the ventral thecal sac, contributing to mild spinal canal stenosis. C6-C7: Disc osteophyte complex which indents the ventral thecal sac. Facet arthrosis. C7-T1: No significant disc herniation. No spinal canal stenosis or neural foraminal narrowing. SPINAL CANAL STENOSIS: Moderate spinal canal stenosis at C1-2, best appreciated on sagittal images with limited evaluation on axial images due to positioning and motion artifact. Additional mild spinal canal stenosis at C5-6. IMPRESSION: 1. Displaced type 2 odontoid fracture with significant posterior dislocation of the dens relative to  the body of C2. Associated edema and possible blood products interposed between dens and C2. No definite epidural hematoma. 2. Displacement of dens as well as malalignment of the bilateral atlantoaxial articulations results in moderate spinal canal stenosis and ventral cervical cord flattening. 3. Disruption of the anterior longitudinal ligament and posterior longitudinal ligament at C2. Edema along the posterior atlantoaxial membrane suggestive of ligamentous injury. Possible disruption of the ligamentum flavum at C2-3 and C3-4. 4. C1 fractures better seen on CT. 5. Significant prevertebral edema. 6. Disc osteophyte complexes at C5-6 contributing to mild spinal canal  stenosis. 7. Findings discussed with Dr. Cecilia at 5:22 PM on 11/22/23. Electronically signed by: Donnice Mania MD 11/22/2023 05:37 PM EDT RP Workstation: [id number]      Labs:  Basic Metabolic Panel: Recent Labs  Lab 12/05/23 0453 12/09/23 0604  NA 137 138  K 4.1 4.2  CL 107 106  CO2 19* 23  GLUCOSE 99 110*  BUN 35* 49*  CREATININE 1.21 1.21  CALCIUM  9.6 9.6    CBC: Recent Labs  Lab 12/05/23 0453 12/09/23 0604  WBC 9.5 12.0*  HGB 12.0* 13.0  HCT 37.6* 39.2  MCV 94.5 91.8  PLT 183 228    CBG: No results for input(s): GLUCAP in the last 168 hours.  Brief HPI:   Zachary Underwood is a 89 y.o. male with a significant medical history persistent atrial fibrillation on Xarelto , history of CVA, moderate aortic stenosis, s/p surgical repair of ascending thoracic aorta, HTN, HLD, BPH, chronic lower extremity edema  who presented to [facility] on 11/22/2023 via EMS after experiencing a fall, striking his head and neck. Upon arrival, initial vital signs were stable. Laboratory tests revealed WBC 7.0, Hemoglobin 10.6, Platelets 170, Sodium 138, Potassium 3.9, BUN 127, Creatinine 1.38, and Bicarb 24. A CT scan of the head showed no acute cranial abnormalities but revealed a frontal parietal scalp laceration and hematoma, generalized cerebral atrophy, and no infarcts within the left cerebellar hemisphere. A CT maxillofacial scan showed no evidence of fracture, but a separate CT spine scan identified a type 2 odontoid fracture with approximately 12 mm posterior displacement relative to the body of C2, associated perivertebral soft tissue swelling, and possible epidural hematoma.    Dr. Lanis consulted was therefore requested  An MRI of the cervical spine showed sub-ligamentous hematoma but no spinal cord compression at C1-2. Conservative management was recommended, [place] -J collar to remain in place at all times. Prior to admission, Xarelto  was held, and the patient received K-centra in the  ED. Cardiology was consulted for a.fib and bradycardia and indicated no need for temporary pacing as the patient remained asymptomatic. 2D echo showed EF of 70 to 75%, no regional WMA, mild mitral stenosis, moderate aortic valve stenosis, moderate dilation of ascending aorta measuring 47 mm. The patient exhibited dysarthria and swallowing deficits and was placed on a dysphagia 3 diet with nectar-thick liquids.  Patient had an MRI of his brain completed without acute CVA.  Patient was seen by Dr. Burnetta orthopedics, recommended continued nonoperative management.  Plan for repeat x-rays in 4 weeks.  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  He currently requires CGA with rolling walker and cueing for safety, min assist to stand and CGA to min assist for ambulation.Therapy evaluations completed due to patient decreased functional mobility was admitted for a comprehensive rehab program.      Hospital Course: Zachary Underwood was admitted to rehab 11/28/2023  for inpatient therapies to consist of PT, ST and OT at least three hours five days a week. Past admission physiatrist, therapy team and rehab RN have worked together to provide customized collaborative inpatient rehab. He was admitted to inpatient rehabilitation due to functional deficits secondary to a type 2 posterior displaced odontoid fracture following a fall. His treatment involved stabilization with a [place]-J cervical collar as advised by the neurosurgery team, which effectively immobilized the fracture. For DVT prophylaxis, he was maintained on Xarelto . Pain management was comprehensive, incorporating multimodal therapies such as a cortisone injection specifically targeting his left knee pain, which was administered by the orthopedics team. Severe neck pain was managed with non-pharmacological measures, including cold therapy using ice packs. For headaches, Fioricet was prescribed; despite discussing alternative options,  the family opted to wait before making any changes. As kidney function improved patient Celebrex  was continued for acute pain relief.  He received Benadryl  25 mg nightly as needed for sleep. For his shortness of breath and anxiety, which were potentially linked to vertigo, fear and of falling Buspar was considered, family initially decided to defer its use, continuing with alprazolam  instead. Family then agreed to temporary prescription of alprazolam  and Wellbutrin , outpatient follow up is advised.  An ENT outpatient evaluation was recommended post-discharge to assess and manage vertigo symptoms.His diet was carefully managed at a dysphagia level 3 with nectar-thick liquids, and he adhered to aspiration precautions under the guidance of speech therapy.  His albumin levels improved significantly, leading to eventual clearance for thin liquids.   Blood pressures were monitored on TID basis. For hypertension management, he resumed his home medication, irbesartan , but blood pressures remained labile. The dosage of metolazone  was increased to 5 mg daily, which contributed to improved blood pressure control. Cardiology monitored his chronic diastolic congestive heart failure (CHF) and edema; no signs of volume overload were present on X-ray. However, due to observed weight gain and elevated blood pressure, metolazone  was further adjusted. He continued his home atorvastatin  regimen for hyperlipidemia management. His mild anemia was monitored, showing improving hemoglobin levels over time. Episodes of bradycardia and atrial fibrillation were noted, but cardiology determined that a temporary pacemaker was unnecessary. These bradycardia episodes resolved spontaneously, and atropine  was advised for rapid use only if needed.  A repeat echocardiogram was advised to reassess aortic stenosis. Orthopedics consulted on the presence of possible C2 prevertebral edema, recommending treatment with decadron  followed by a taper dose to  aid in improving dysphagia. Vitamin d  level was found to be within low-normal range, he was started on vitamin D  supplementation table twice daily for 6 weeks for fracture prevention.    Rehab course: During patient's stay in rehab weekly team conferences were held to monitor patient's progress, set goals and discuss barriers to discharge. At admission, patient required CGA with rolling walker and cueing for safety, min assist to stand and CGA to min assist for ambulation. He has had improvement in activity tolerance, balance, postural control as well as ability to compensate for deficits. He has had improvement in functional use RUE/LUE  and RLE/LLE as well as improvement in awareness. He also met all goals with PT/OT and will benefit from ongoing skilled PT/OT in the home health setting. He also made excellent progress with speech therapy meeting all long-term goals he required nectar liquids upon admission but is now tolerating dysphagia 3 diet thin liquids with minimal airway invasion at mod I. Family teaching completed upon discharge to home. Discharge instructions reviewed with wife (phone)patients  sister and Aid at bedside. Ice packs returned along with all belongings.      Disposition:  Discharge disposition: 01-Home or Self Care        Diet: Heart Healthy   Special Instructions:  -Patient should not drive on return home d/t reduced neck ROM.   -Keep dysphagia 3 diet on discharge - can have regular diet at home (including raw vegetables) with larger pills being crushed.   -No driving smoking or alcohol or illicit drug use    -Maintain Collar at all times   -Incentive spirometry- used for anxiety-instructions provided.    Discharge Instructions     Ambulatory referral to Physical Medicine Rehab   Complete by: As directed       Allergies as of 12/11/2023       Reactions   Short Ragweed Pollen Ext Other (See Comments)   Unknown    Beef-derived Drug Products Other (See  Comments)   Patient does not eat beef    Eliquis  [apixaban ] Itching   Lasix [furosemide] Itching   Other Other (See Comments), Hypertension   Fusid- Legs became very swollen   Tylenol [acetaminophen] Hives        Medication List     STOP taking these medications    amoxicillin -clavulanate 875-125 MG tablet Commonly known as: AUGMENTIN    candesartan 4 MG tablet Commonly known as: ATACAND Replaced by: irbesartan  75 MG tablet   lactulose  10 GM/15ML solution Commonly known as: CHRONULAC    oxyCODONE  5 MG immediate release tablet Commonly known as: Oxy IR/ROXICODONE    torsemide  20 MG tablet Commonly known as: DEMADEX        TAKE these medications    ALPRAZolam  0.25 MG tablet Commonly known as: XANAX  Take 0.5 tablets (0.125 mg total) by mouth every 4 (four) hours as needed for anxiety.   atorvastatin  10 MG tablet Commonly known as: LIPITOR Take 5 mg by mouth at bedtime.   buPROPion  150 MG 24 hr tablet Commonly known as: WELLBUTRIN  XL Take 1 tablet (150 mg total) by mouth daily.   calcium -vitamin D  500-5 MG-MCG tablet Commonly known as: OSCAL WITH D Take 1 tablet by mouth 2 (two) times daily.   celecoxib  200 MG capsule Commonly known as: CELEBREX  Take 1 capsule (200 mg total) by mouth daily. May also take 1 capsule (200 mg total) daily as needed (breakthrough pain; do not take within 6 hours of AM dose). What changed: See the new instructions.   diphenhydrAMINE  25 mg capsule Commonly known as: BENADRYL  Take 1 capsule (25 mg total) by mouth at bedtime as needed for sleep.   feeding supplement Liqd Take 237 mLs by mouth 2 (two) times daily between meals.   ferrous sulfate 325 (65 FE) MG EC tablet Take 325 mg by mouth daily.   fluticasone  50 MCG/ACT nasal spray Commonly known as: FLONASE  Place 2 sprays into both nostrils daily.   irbesartan  75 MG tablet Commonly known as: AVAPRO  Take 0.5 tablets (37.5 mg total) by mouth daily. Replaces: candesartan 4 MG  tablet   lidocaine  5 % Commonly known as: LIDODERM  Place 1 patch onto the skin daily. Remove & Discard patch within 12 hours or as directed by MD   loperamide  2 MG capsule Commonly known as: IMODIUM  Take 1 capsule (2 mg total) by mouth every 4 (four) hours as needed for diarrhea or loose stools.   loratadine  10 MG tablet Commonly known as: CLARITIN  Take 1 tablet (10 mg total) by mouth daily.   methocarbamol  500 MG tablet  Commonly known as: ROBAXIN  Take 1 tablet (500 mg total) by mouth 3 (three) times daily.   metolazone  2.5 MG tablet Commonly known as: ZAROXOLYN  Take 1 tablet (2.5 mg total) by mouth daily.   multivitamin with minerals tablet Take 0.5 tablets by mouth daily.   pantoprazole  40 MG tablet Commonly known as: PROTONIX  Take 1 tablet (40 mg total) by mouth 2 (two) times daily before a meal.   polyethylene glycol 17 g packet Commonly known as: MIRALAX  / GLYCOLAX  Take 17 g by mouth daily as needed for mild constipation. What changed:  when to take this reasons to take this   senna-docusate 8.6-50 MG tablet Commonly known as: Senokot-S Take 1 tablet by mouth at bedtime as needed for moderate constipation. What changed:  when to take this reasons to take this   sodium chloride  0.65 % Soln nasal spray Commonly known as: OCEAN Place 1 spray into both nostrils as needed for congestion.   tamsulosin  0.4 MG Caps capsule Commonly known as: FLOMAX  Take 0.4 mg by mouth at bedtime.   triamcinolone  cream 0.1 % Commonly known as: KENALOG  Apply 1 Application topically 2 (two) times daily as needed. Mix with Eucerin cream as directed.   Xarelto  15 MG Tabs tablet Generic drug: Rivaroxaban  Take 1 tablet (15 mg total) by mouth daily with supper.        Follow-up Information     Burnetta Aures, MD. Schedule an appointment as soon as possible for a visit in 1 month(s).   Specialty: Orthopedic Surgery Why: fracture follow up Contact information: [address] [place] [address] [phone]         Emeline Search C, DO Follow up.   Specialty: Physical Medicine and Rehabilitation Why: Office will call for appointment Contact information: [address] [place] [address] [phone]         Delores Rojelio Caldron, NP Follow up.   Specialty: Nurse Practitioner Why: Call for an appointment for follow up in 2 weeks. Contact information: [address] [place] [address] [phone]                 Signed: Daphne LITTIE Finders 12/11/2023, 1:14 PM

## 2023-12-09 NOTE — Progress Notes (Signed)
 Occupational Therapy Session Note  Patient Details  Name: Zachary Underwood MRN: [id number] Date of Birth: [birth year]  Today's Date: 12/09/2023 OT Individual Time: 9192-9095 OT Individual Time Calculation (min): 57 min    Short Term Goals: Week 2:  OT Short Term Goal 1 (Week 2): STG= LTG d/t ELOS  Skilled Therapeutic Interventions/Progress Updates:  Pt greeted resting in bed for skilled OT session with focus on BADL retraining, functional transfers/mobility, and general conditioning.   Pain: Pt reported 4/10 pain in back of neck, medications administered during session. OT offering intermediate rest breaks and positioning suggestions throughout session to address pain/fatigue and maximize participation/safety in session.   Functional Transfers: Bed mobility, sit<>stands, and ambuloratory transfers with supervision, use of rollator for long-distance transfers. Min cuing for safe brake management.    Self Care Tasks: Pt completes the following self care tasks with levels of assistance noted below, UB: Standing donning of button-up shirt with supervision.  LB: Standing hike with supervision.   Therapeutic Activities: Pt participates in 2x5min cycles of alternating toes taps onto 3 in target, pt completes with unilateral support + CGA. Pt with greater difficulty hiking LLE. Activity completed to target BLE strengthening/coordination for decreased fall risk upon discharge.   Therapeutic Exercise: Pt instructed in series of BLE exercises (while waiting for medication preparation/administration). Pt completes 2x10 reps of LAQ, leg extensions, and hip adductions.   Pt remained sitting in recliner with 4Ps assessed and immediate needs met. Pt continues to be appropriate for skilled OT intervention to promote further functional independence in ADLs/IADLs.   Therapy Documentation Precautions:  Precautions Precautions: Fall, Cervical Required Braces or Orthoses: Cervical Brace Cervical Brace: At  all times, Hard collar Restrictions Weight Bearing Restrictions Per Provider Order: No   Therapy/Group: Individual Therapy  Nereida Habermann, OTR/L, MSOT  12/09/2023, 5:16 AM

## 2023-12-09 NOTE — Progress Notes (Signed)
 Patient ID: Zachary Underwood, male   DOB: Aug 05, 1927, 89 y.o.   MRN: [id number]  SW provided pt with CD imaging per wife's request and left with pt.   Graeme Jude, MSW, LCSW Office: [phone] Cell: [phone] Fax: [phone]

## 2023-12-09 NOTE — Progress Notes (Signed)
 PROGRESS NOTE   Subjective/Complaints: No events overnight. Vitals stable     12/09/2023    5:00 AM 12/09/2023    4:37 AM 12/08/2023    8:20 PM  Vitals with BMI  Weight 174 lbs 10 oz    BMI 26.55    Systolic  137 154  Diastolic  59 78  Pulse  58 56    No results for input(s): GLUCAP in the last 72 hours.   P.o. intakes appropriate  Continent of bladder  Multiple Bms overnight; bowel medications adjusted  WBC 12 this AM; BUN elevated. Likely d/t steroids.    ROS: Denies fevers, chills, N/V, abdominal pain, constipation, diarrhea, chest pain, new weakness or paraesthesias.   + Dysphagia/dysarthria--improving + Intermittent shortness of breath and anxiety- ongoing, with reclining, and elevating bed + Neck itching  Objective:   No results found.  Recent Labs    12/09/23 0604  WBC 12.0*  HGB 13.0  HCT 39.2  PLT 228    Recent Labs    12/09/23 0604  NA 138  K 4.2  CL 106  CO2 23  GLUCOSE 110*  BUN 49*  CREATININE 1.21  CALCIUM  9.6     Intake/Output Summary (Last 24 hours) at 12/09/2023 0820 Last data filed at 12/08/2023 2344 Gross per 24 hour  Intake 600 ml  Output 250 ml  Net 350 ml        Physical Exam: Vital Signs Blood pressure (!) 137/59, pulse (!) 58, temperature 98.9 F (37.2 C), resp. rate 18, height 5' 8 (1.727 m), weight 79.2 kg, SpO2 93%.    General: No apparent distress.  Ambulating with rolling walker with CGA. HEENT: EOMI.  PERRLA.  No nystagmus.  Head is normocephalic. C-spine brace in place Neck: Supple without JVD or lymphadenopathy.  C-collar intact.   Heart:   Bradycardia, irregular rhythm.  + systolic ejection murmur Chest: CTA bilaterally without wheezes, rales, or rhonchi; no distress.     Abdomen: Soft, non-tender, non-distended, bowel sounds positive. Extremities:    trace+ bilateral lower extremity edema Psych: Pt's affect is appropriate. Pt is  cooperative.  Skin: Clean and intact without signs of breakdown  + L shoulder - Chronic Lipoma--stable  + Right anterior scalp laceration --stable--under mepilex dressing, c/d No apparent rash, erythema around itching site around chin, collar  PRIOR EXAMS: Neuro:   Mental Status: Awake, alert; oriented to self, place, and time. No obvious cognitive deficits.   Speech/Languate: Intact, mild dysarthria.  100% intelligible. CRANIAL NERVES: 2 through 12 grossly intact   MOTOR: Moving all 4 limbs antigravity, against resistance in bed.  Strength: 4+/5 throughout bilateral upper and lower extremities     SENSORY: Normal to touch all 4 extremities.     Coordination: No ataxia, no tremor  Gait: Ambulates with a rolling walker at supervision level, stable stance and stride   MSK: Right shoulder abduction limited to less than 90 degrees--unchanged  Assessment/Plan: 1. Functional deficits which require 3+ hours per day of interdisciplinary therapy in a comprehensive inpatient rehab setting. Physiatrist is providing close team supervision and 24 hour management of active medical problems listed below. Physiatrist and rehab team continue to assess barriers  to discharge/monitor patient progress toward functional and medical goals  Care Tool:  Bathing    Body parts bathed by patient: Right arm, Left arm, Abdomen, Chest, Front perineal area, Buttocks, Right upper leg, Left upper leg, Face, Right lower leg, Left lower leg   Body parts bathed by helper: Right lower leg, Left lower leg     Bathing assist Assist Level: Supervision/Verbal cueing     Upper Body Dressing/Undressing Upper body dressing   What is the patient wearing?: Pull over shirt    Upper body assist Assist Level: Supervision/Verbal cueing    Lower Body Dressing/Undressing Lower body dressing    Lower body dressing activity did not occur: Refused What is the patient wearing?: Pants     Lower body assist Assist  for lower body dressing: Supervision/Verbal cueing     Toileting Toileting    Toileting assist Assist for toileting: Supervision/Verbal cueing     Transfers Chair/bed transfer  Transfers assist     Chair/bed transfer assist level: Supervision/Verbal cueing     Locomotion Ambulation   Ambulation assist      Assist level: Supervision/Verbal cueing Assistive device: Rollator Max distance: 200   Walk 10 feet activity   Assist     Assist level: Supervision/Verbal cueing Assistive device: Rollator   Walk 50 feet activity   Assist    Assist level: Supervision/Verbal cueing Assistive device: Rollator    Walk 150 feet activity   Assist    Assist level: Supervision/Verbal cueing Assistive device: Rollator    Walk 10 feet on uneven surface  activity   Assist     Assist level: Minimal Assistance - Patient > 75% Assistive device: Walker-rolling   Wheelchair     Assist Is the patient using a wheelchair?: Yes Type of Wheelchair: Manual      Max wheelchair distance: 150'    Wheelchair 50 feet with 2 turns activity    Assist        Assist Level: Dependent - Patient 0%   Wheelchair 150 feet activity     Assist      Assist Level: Dependent - Patient 0%   Blood pressure (!) 137/59, pulse (!) 58, temperature 98.9 F (37.2 C), resp. rate 18, height 5' 8 (1.727 m), weight 79.2 kg, SpO2 93%.  Medical Problem List and Plan: 1. Functional deficits secondary to type II posterior displaced odontoid fracture after a fall             -patient may shower; maintain cervical collar 24/7             -ELOS/Goals: 5-7 days, PT/OT/SLP Mod I -- 8/20 DC date             -Pts wife would like to be notified before any medication changes  Continue CIR    - 8/7: Dr. Burnetta with ortho spine provided second opinion; recommendations  - Hard cervical collar at all times.  - reevaluate the fracture with x-rays in 4 weeks. - - Recommend he continue to  use Celebrex  and muscle relaxer as this is controlling his pain.  - follow-up with me in 4 weeks.  - 8/7: Provided list of PRN medications to patient  - 8/12: Reported caregiver at home was bathing and dressing him. Currently full SPV most ADLs, limited by SOB/symptomatic bradycardia episodes when mobilized--goals Mod I. SPV bed mobility/transfers, CGA with rollator 150 ft. Goals SPV. Limited by endurance. 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   -  Family meeting with patient, wife, nursing admin, hospital admin, Architectural technologist, and medical team  today to discuss patient care -- provided appropriate boundaries with staff (ex. Not following nurses in hallway), reasonable expectations for medical updates, and patient advocacy - provided physician email and office # and discussed physician schedule, NP availability and limitations for medical updates/communication. Wife, team in agreement on minimizing stress with nursing surrounding attending to the patient and his needs. Emphasized OK to seek urgent care if emergency is suspected.    -  Will get nursing to provide call button due to difficulty finding call bell with C collar   - Due to anxiety and fear of falling exacerbated by posterior lean, along with ongoing surgical precautions with high risk of odontoid displacement with malpositioning, feel patient would benefit from a hospital bed to facilitate head elevation and improve bed mobility and independence.   8/18: Neck xray prior to discharge  2.  Impaired mobility: messaged Tomeka to please walk with him if she has time -DVT/anticoagulation:  Mechanical: continue Sequential compression devices, below knee Bilateral lower extremities Pharmaceutical: Xarelto              -antiplatelet therapy: N/A  3. Pain Management: continue Celebrex  (monitor Cr) Robaxin , oxycodone  as needed             - Left knee pain-received cortisone injection by  orthopedics  - 8-8: Switch to tramadol  50 mg for moderate pain and oxycodone  2.5 mg for severe pain every 6 hours as needed.  Continue Robaxin , Celebrex .  Nursing to inquire about location of cold packs patient had on prior floor.  8/9: Cold therapy Prn for shoulders, head  8/10 discussed with patient's wife, patient does not ask for tramadol  as needed.  Will schedule twice a day--tolerating  8-11: Left-sided headache.  Patient refusing ice packs.  Per report, tramadol  not effective; changed to as needed.  Patient has Celebrex , oxycodone  available.  Allergic to Tylenol, could consider Topamax but cautious to oversedate.  Continue current regimen for now.  8/12: D/w wife, requests resumption of scheduled celebrex  200 mg BID understanding may result in recurrent AKI. He has been tolerating once daily up to this point - will trial and check labs Thursday.  - DC tramadol .  - Discussed Fioricet, Journavix as alternative PRNS for headache-- family wishes to hold off at this time.   8/13: minimal pain on this regimen; denies any headaches 8-14: Renal function is tolerating Celebrex ; continue 8/18: BUN 49; DC oxycodone , change celebrex  to 200 mg daily  4. Mood/Behavior/Sleep: LCSW to follow for evaluation and support when available.              -antipsychotic agents: N/A  - 8-8.  Benadryl  25 mg nightly as needed for sleep; per wife, patient uses this at home--has not requested, sleeping well per nursing  - 8/12: 1x PRN 0.125 xanax  today with symptomatic improvement but significant lethargy; maintain minimal dosing only, remove 0.25 PRN option and reduce to BID PRN  8/14: Not requiring Xanax  currently; will monitor and potentially DC before the weekend  8-15: 4 episodes of anxiety today, family requesting more frequent Xanax  since he is tolerating it well; emphasized this will need to be picked up by PCP at discharge if he is can to continue, changed to every 2 hours as needed, continue Xanax   5.  Neuropsych/cognition: This patient is capable of making decisions on his own behalf.  6. Skin/Wound Care: routine pressure relief measures -[place] J collar in  place, monitor for changes in character of skin  -scalp laceration present on admission --stable appearance - 8-15: Complaints of itching under cervical collar. Continue Eucerin/hydrocortisone cream 1-1  7. Fluids/Electrolytes/Nutrition: monitor I/O and repeat labs in a.m.              - Dysphagia 3 with nectar thick liquids--continue aspiration precautions. Continue SLP   - 8/8: Eating 50 to 100% of meals; on  Free water protocol for hydration.  Albumin 2.9, will encourage p.o. intakes, appreciate nutrition assistance.  8-11: Labs stable.  Albumin improved from 2.9-3.2.  Cleared for thin fluids today.  8/12: patient refused all meals 8/11; 100% breakfast this AM. Will monitor today - if continues to be poor will get nutritional consult  8-13: P.o. intakes picked up , okay to monitor for now  8. HTN: resumed home irbesartan --no BB due to bradycardia  - 8-8: Add as needed hydralazine  10 milligrams every 6 hours for SBP greater than 160, DBP greater than 100.  Increased blood pressure likely due to pain, monitor today may increase irbesartan  if renal function improves tomorrow.  - BP a little labile, continue to monitor trend and continue current medications  8/11: HTN with therapies, bradycardia - ?contribution of pain/headache vs. Anxiety. Self-resolves once calm.   8/13: HTN 140-160s; increase metolazone  to 5 mg daily--improved    12/09/2023    5:00 AM 12/09/2023    4:37 AM 12/08/2023    8:20 PM  Vitals with BMI  Weight 174 lbs 10 oz    BMI 26.55    Systolic  137 154  Diastolic  59 78  Pulse  58 56    9.  Chronic diastolic CHF/chronic lower extremity edema: patient's wife requested metolazone  and not Demadex .              -Currently on metolazone  2.5 mg daily, BNP 261-- wife says this CAN NOT BE REMOVED   - Daily weights and  volume assessments.  - 8-11: Volume assessments and symptoms without signs of volume overload; chest x-ray 8-9 with improved pulmonary edema.  Weights downtrending while albumin is improving.  Monitor.  8/12: Stable cardiomegaly, pleural effusions on CXR--no external s/s volume overload; trend weights. May need to increase metolazone .   8/13: Weights uptrending, BP elevated - increase metolazone  to 5 mg daily; already discussed as potential with patient's wife, informed nursing to notify family when they get in.  Weight reviewed and is stable  Filed Weights   12/07/23 0500 12/08/23 0500 12/09/23 0500  Weight: 80.1 kg 81.1 kg 79.2 kg    10. HLD: continue home atorvastatin   11.  Mild anemia: Continue to monitor CBC recheck labs in a.m.  - 8-11 hemoglobin uptrending 11.4--12.0 on 8-14  12.  AKI: Creatinine 1.38 on admission baseline approximately 1.0--trend creatinine. Last Cr 1.52. Recheck tomorrow.  - 8-8: Creatinine 1.5, BUN slightly increased.  Plan for IV fluids very gentle normal saline at 50 cc/h for 10 hours.  Repeat BMP in AM.  Did discuss with wife that if AKI does not improve, may need to adjust scheduled Celebrex  to as needed.  -8/9 Cr a little better at 1.51, change celebrex  to PRN  -8/11 - BUN improving, Cr normalized - continue current regimen     13. Obesity             Body mass index is 30.3 kg/m.  14. Bradycardia/atrial fibrillation             -Pt was evaluated by  cardiology, no indication for temporary pacing   - Regular rate  -8/9 HR stable currently, nursing reports occasional bradycardic episodes at night, pt was evaluated by cardiology for this during acute admission  8-11: Reported episode of bradycardia 30s to 50s during therapies today; self resolved, patient asymptomatic.  If recurs again, will get EKG.  8/12: 2x episode SOB and bradycardia 30-50s with therapy this AM; EKG showed ?a flutter with ventricular rate 40s. Re-consulted cardiology for symptomatic  bradycardia, added PRN atropine  0.5 mg IV (for use with rapid response if needed); not candidate for pacemake with last evaluation 8/13: Per EP: If he has episodes of syncope related to bradycardia, pacemaker implant would be reasonable. As he does have a c-collar in place, we will hold off for now. He can follow-up with general cardiology. --No recurrence 12-04-13  15. Hx of aortic aneurysm/dissection -f/u outpatient, BP control   -Blood pressure management as above, has as needed hydralazine   16. Aortic stenosis             -Repeat echo 6-12 months has been recommended  17. L knee pain             -L knee injection completed by ortho 8/1--no current complaints of pain  18.  Right shoulder pain--no complaints on exam 8-11  19.  Dysphagia/secretion management  - Mucinex  600 mg twice daily  - Robitussin DM as needed  - DC Claritin  10 mg daily  -Was placed on Unasyn  --> Augmentin  for suspected aspiration, chest x-ray 8-9 improved without opacity; finish antibiotics at 10 days total  -8-8: Patient managing secretions well on current exam, no current rhonchi or wheezing, no concerns for aspiration.  Monitor oxygen and respiratory status closely.  Can add scopolamine patch if needed, but would hold off due to multiple anticholinergic medications unless absolutely needed.  -8/9  Restart claritin -has allergies hx, discussed increased use of suction  -8/10 nasal congestion no better--Flonase  BID started 8/8  8-11: MBS today with pill dysphagia, otherwise cleared for thin liquids.  Possible prevertebral edema noted at C2, discussed with Dr. Georgina with orthopedics, will start Decadron  4 mg twice daily x 5 days with possible taper to see if this improves dysphagia  8/12: POs better today; monitor as above  8-14: Continues to improve; wife concerned regarding ongoing secretions and dysarthria, wants to discuss increasing steroid regimen with neurosurgery but does not want to involve Dr. Lanis  - Dr.  Gillie called back to provide input; would not increase steroid regimen, no additional interventions offered.  Appreciate his expertise.   - 8-15: Finishing up Decadron  4 mg twice daily; will provide gradual taper of 2 mg twice daily for 2 days and 1 mg twice daily for 1 day prior to discharge  20. Occasional SOB and Anxiety, ?vertigo/fear of falling   -Suspect most likely anxiety but secretions could be contributing. Will check CXR-negative for acute changes, Consider prn anxiety medication    -start xanax  0.25 PRN, discussed with his wife  - 23/10 patient's wife would like to have option for 0.125 dose of Xanax  as needed.  Order adjusted   -Schedule for neuropscyh- next available --discussed patient case with Dr. Corina 8-11  - 8/12: Recurrent, associated with bradycardia and HTN; See #4, #14 above. Also with bed positional changes. May benefit from Buspar trial, family wishes to defer at this time. Offered atarax as alternative to xanax , family defers at this time. Discussed limitations of audiology consult for vertigo; offered COR testing however  patient has already seen ENT for OP evaluation for this.   8-13: No intervention per EP, still occurring primarily with head going back; resolves quickly with use of incentive spirometer  --See #4 above.   LOS: 11 days A FACE TO FACE EVALUATION WAS PERFORMED  Joesph JAYSON Likes 12/09/2023, 8:20 AM

## 2023-12-09 NOTE — Plan of Care (Signed)
  Problem: Consults Goal: RH GENERAL PATIENT EDUCATION Description: See Patient Education module for education specifics. 12/09/2023 1820 by Trudy Jon MATSU, RN Outcome: Progressing 12/09/2023 1820 by Trudy Jon MATSU, RN Outcome: Progressing   Problem: RH BOWEL ELIMINATION Goal: RH STG MANAGE BOWEL WITH ASSISTANCE Description: STG Manage Bowel with mod I Assistance. 12/09/2023 1820 by Trudy Jon MATSU, RN Outcome: Progressing 12/09/2023 1820 by Trudy Jon MATSU, RN Outcome: Progressing   Problem: RH BLADDER ELIMINATION Goal: RH STG MANAGE BLADDER WITH ASSISTANCE Description: STG Manage Bladder With mod I Assistance 12/09/2023 1820 by Trudy Jon MATSU, RN Outcome: Progressing 12/09/2023 1820 by Trudy Jon MATSU, RN Outcome: Progressing   Problem: RH SKIN INTEGRITY Goal: RH STG SKIN FREE OF INFECTION/BREAKDOWN Description: [place] skin free of infection/breakdown with mod I assistance 12/09/2023 1820 by Trudy Jon MATSU, RN Outcome: Progressing 12/09/2023 1820 by Trudy Jon MATSU, RN Outcome: Progressing   Problem: RH SAFETY Goal: RH STG ADHERE TO SAFETY PRECAUTIONS W/ASSISTANCE/DEVICE Description: STG Adhere to Safety Precautions With Assistance/Device. 12/09/2023 1820 by Trudy Jon MATSU, RN Outcome: Progressing 12/09/2023 1820 by Trudy Jon MATSU, RN Outcome: Progressing   Problem: RH PAIN MANAGEMENT Goal: RH STG PAIN MANAGED AT OR BELOW PT'S PAIN GOAL Description: <4 w/ prns 12/09/2023 1820 by Trudy Jon MATSU, RN Outcome: Progressing 12/09/2023 1820 by Trudy Jon MATSU, RN Outcome: Progressing   Problem: RH KNOWLEDGE DEFICIT GENERAL Goal: RH STG INCREASE KNOWLEDGE OF SELF CARE AFTER HOSPITALIZATION Description: Manage increase knowledge of self care after hospitalization with mod I assistance using educational materials provided from wife  12/09/2023 1820 by Trudy Jon MATSU, RN Outcome: Progressing 12/09/2023 1820 by Trudy Jon MATSU,  RN Outcome: Progressing

## 2023-12-10 ENCOUNTER — Inpatient Hospital Stay (HOSPITAL_COMMUNITY)

## 2023-12-10 ENCOUNTER — Other Ambulatory Visit (HOSPITAL_COMMUNITY): Payer: Self-pay

## 2023-12-10 MED ORDER — LIDOCAINE 5 % EX PTCH: 1.0000 | MEDICATED_PATCH | CUTANEOUS | 0 refills | Status: DC

## 2023-12-10 MED ORDER — OYSTER SHELL CALCIUM/D3 500-5 MG-MCG PO TABS: 1.0000 | ORAL_TABLET | Freq: Two times a day (BID) | ORAL | 0 refills | Status: DC

## 2023-12-10 MED ORDER — BUPROPION HCL ER (XL) 150 MG PO TB24
150.0000 mg | ORAL_TABLET | Freq: Every day | ORAL | 0 refills | Status: DC
  Filled 2023-12-10: qty 30, 30d supply, fill #0

## 2023-12-10 MED ORDER — LOPERAMIDE HCL 2 MG PO CAPS: 2.0000 mg | ORAL_CAPSULE | ORAL | 0 refills | Status: DC | PRN

## 2023-12-10 MED ORDER — RIVAROXABAN 15 MG PO TABS
15.0000 mg | ORAL_TABLET | Freq: Every day | ORAL | 0 refills | Status: AC
  Filled 2023-12-10: qty 30, 30d supply, fill #0

## 2023-12-10 MED ORDER — CELECOXIB 100 MG PO CAPS: 200.0000 mg | ORAL_CAPSULE | Freq: Every day | ORAL | Status: DC | PRN

## 2023-12-10 MED ORDER — BUPROPION HCL ER (XL) 150 MG PO TB24
150.0000 mg | ORAL_TABLET | Freq: Every day | ORAL | Status: DC
  Administered 2023-12-11: 150 mg via ORAL
  Filled 2023-12-10: qty 1

## 2023-12-10 MED ORDER — ALPRAZOLAM 0.25 MG PO TABS
0.1250 mg | ORAL_TABLET | ORAL | 0 refills | Status: DC | PRN
  Filled 2023-12-10: qty 15, 5d supply, fill #0

## 2023-12-10 MED ORDER — CELECOXIB 200 MG PO CAPS
ORAL_CAPSULE | ORAL | 0 refills | Status: DC
  Filled 2023-12-10: qty 60, 30d supply, fill #0

## 2023-12-10 MED ORDER — OXYCODONE HCL 5 MG PO TABS: 2.5000 mg | ORAL_TABLET | Freq: Four times a day (QID) | ORAL | 0 refills | Status: DC | PRN

## 2023-12-10 MED ORDER — RIVAROXABAN 15 MG PO TABS: 15.0000 mg | ORAL_TABLET | Freq: Every day | ORAL | 0 refills | Status: DC

## 2023-12-10 MED ORDER — BUPROPION HCL ER (XL) 150 MG PO TB24: 150.0000 mg | ORAL_TABLET | Freq: Every day | ORAL | 0 refills | Status: DC

## 2023-12-10 MED ORDER — CELECOXIB 200 MG PO CAPS: ORAL_CAPSULE | ORAL | 0 refills | Status: DC

## 2023-12-10 MED ORDER — METOLAZONE 2.5 MG PO TABS
2.5000 mg | ORAL_TABLET | Freq: Every day | ORAL | Status: DC
Start: 2023-12-11 — End: 2023-12-11
  Administered 2023-12-11: 2.5 mg via ORAL
  Filled 2023-12-10: qty 1

## 2023-12-10 MED ORDER — SENNOSIDES-DOCUSATE SODIUM 8.6-50 MG PO TABS
1.0000 | ORAL_TABLET | Freq: Every evening | ORAL | 0 refills | Status: DC | PRN
  Filled 2023-12-10: qty 10, 10d supply, fill #0

## 2023-12-10 MED ORDER — OYSTER SHELL CALCIUM/D3 500-5 MG-MCG PO TABS
1.0000 | ORAL_TABLET | Freq: Two times a day (BID) | ORAL | Status: DC
  Administered 2023-12-10 – 2023-12-11 (×3): 1 via ORAL
  Filled 2023-12-10 (×4): qty 1

## 2023-12-10 MED ORDER — SENNOSIDES-DOCUSATE SODIUM 8.6-50 MG PO TABS: 1.0000 | ORAL_TABLET | Freq: Every evening | ORAL | 0 refills | Status: DC | PRN

## 2023-12-10 MED ORDER — CELECOXIB 100 MG PO CAPS
200.0000 mg | ORAL_CAPSULE | Freq: Every day | ORAL | Status: DC
  Administered 2023-12-11: 200 mg via ORAL
  Filled 2023-12-10: qty 2

## 2023-12-10 MED ORDER — METHOCARBAMOL 500 MG PO TABS
500.0000 mg | ORAL_TABLET | Freq: Three times a day (TID) | ORAL | 0 refills | Status: DC
  Filled 2023-12-10: qty 90, 30d supply, fill #0

## 2023-12-10 MED ORDER — IRBESARTAN 75 MG PO TABS: 37.5000 mg | ORAL_TABLET | Freq: Every day | ORAL | 0 refills | Status: DC

## 2023-12-10 MED ORDER — IRBESARTAN 75 MG PO TABS
37.5000 mg | ORAL_TABLET | Freq: Every day | ORAL | 0 refills | Status: DC
  Filled 2023-12-10: qty 30, 60d supply, fill #0

## 2023-12-10 MED ORDER — LOPERAMIDE HCL 2 MG PO CAPS
2.0000 mg | ORAL_CAPSULE | ORAL | 0 refills | Status: DC | PRN
  Filled 2023-12-10: qty 30, 5d supply, fill #0

## 2023-12-10 MED ORDER — POLYETHYLENE GLYCOL 3350 17 G PO PACK: 17.0000 g | PACK | Freq: Every day | ORAL | Status: DC | PRN

## 2023-12-10 MED ORDER — ALPRAZOLAM 0.25 MG PO TABS: 0.1250 mg | ORAL_TABLET | ORAL | 0 refills | Status: DC | PRN

## 2023-12-10 MED ORDER — DIPHENHYDRAMINE HCL 25 MG PO CAPS: 25.0000 mg | ORAL_CAPSULE | Freq: Every evening | ORAL | Status: DC | PRN

## 2023-12-10 MED ORDER — METHOCARBAMOL 500 MG PO TABS: 500.0000 mg | ORAL_TABLET | Freq: Three times a day (TID) | ORAL | 0 refills | Status: DC

## 2023-12-10 MED ORDER — TRIAMCINOLONE 0.1 % CREAM:EUCERIN CREAM 1:1: 1.0000 | TOPICAL_CREAM | Freq: Two times a day (BID) | CUTANEOUS | 0 refills | Status: DC | PRN

## 2023-12-10 MED ORDER — LIDOCAINE 5 % EX PTCH
1.0000 | MEDICATED_PATCH | CUTANEOUS | 0 refills | Status: DC
  Filled 2023-12-10: qty 30, 30d supply, fill #0

## 2023-12-10 MED ORDER — OYSTER SHELL CALCIUM/D3 500-5 MG-MCG PO TABS
1.0000 | ORAL_TABLET | Freq: Two times a day (BID) | ORAL | 0 refills | Status: DC
  Filled 2023-12-10: qty 60, 30d supply, fill #0

## 2023-12-10 NOTE — Progress Notes (Signed)
 Speech Language Pathology Discharge Summary  Patient Details  Name: Zachary Underwood MRN: [id number] Date of Birth: [birth year]  Date of Discharge from SLP service:December 10, 2023   Patient has met 4 of 4 long term goals.  Patient to discharge at overall Modified Independent level.  Reasons goals not met: n/a   Clinical Impression/Discharge Summary:  Pt has made excellent progress this admission, as evidenced by mastery of 4/4 LTGs. He required nectar (mildly thick) liquids upon admission, but is now tolerating a Dys 3 diet/thin liquids w/ minimal s/s of airway invasion @ modI. He demonstrates adequate oropharyngeal swallow function to tolerate a regular diet in the home environment. He completes provided oropharyngeal and speech production exercises @ modI, though intensity level continues to vary day to day given endurance deficits. Abnormal resonance and imprecise articulation is improving, though mild deficits remain overall. Pt/family education complete, including tasks to complete in the home environment. He would benefit from continued ST upon d/c to further target mild dysphagia and speech production errors remaining to facilitate return to prev roles/responsibilities.   Care Partner:  Caregiver Able to Provide Assistance: Yes  Type of Caregiver Assistance: Cognitive;Physical  Recommendation:  Outpatient SLP  Rationale for SLP Follow Up: Maximize functional communication;Maximize swallowing safety   Equipment: n/a   Reasons for discharge: Discharged from hospital   Patient/Family Agrees with Progress Made and Goals Achieved: Yes    [name] 12/10/2023, 4:01 PM

## 2023-12-10 NOTE — Progress Notes (Signed)
 Speech Language Pathology Daily Session Note  Patient Details  Name: Zachary Underwood MRN: [id number] Date of Birth: 06-30-1927  Today's Date: 12/10/2023 SLP Individual Time: 0800-0900 SLP Individual Time Calculation (min): 60 min  Short Term Goals: Week 2: SLP Short Term Goal 1 (Week 2): STGs=LTGs d/t ELOS  Skilled Therapeutic Interventions: Pt greeted at bedside. He was asleep upon SLP arrival, but woke with moderate verbal/tactile cueing. He was agreeable to tx tasks targeting speech production and dysphagia. He was seen in conjunction w/ morning meal, which comprised of Dys 3 textures, mixed consistencies, and thin liquids via straw. He presented w/ intermittent throat clearing w/ Dys 3 textures. Anticipate this was d/t pharyngeal residue. Liquid wash and double swallow assisted to reduce this. Only an immediate cough x1 was noted w/ thin liquids. SLP provided education re diet recommendations, including ability to tolerate regular textures in the home environment. He then completed IOPI in the anterior position, targeting lingual strength for mastication and articulatory precision. He completed 10 reps @ 29 kpa and 10 reps @ 30 kpa. Attempted to complete additional reps at 33 kpa, which was his measured average 8/18. However, he was unable to complete any reps at this level. Success remains variable day to day. Also completed EMST via [id number]. Resistance increased to 15-17 cmH2O and he was able to complete 25 reps @ modI. Pt encouraged to breathe normally between reps to reduce sensation to cough and/or lightheadedness. He requested review of repetitive phoneme tasks (sentence level) and was able to utilize over articulation to maintain 95% intelligibility. SLP to provide additional articulatory tasks for the home environment. At the end of tx tasks, he was left in his recliner w/ the call light within reach. Recommend cont ST per POC.    Pain  No pain reported  Therapy/Group: Individual  Therapy  [name] 12/10/2023, 8:31 AM

## 2023-12-10 NOTE — Plan of Care (Signed)

## 2023-12-10 NOTE — Progress Notes (Incomplete)
 Patient ID: Zachary Underwood, male   DOB: September 24, 1927, 89 y.o.   MRN: [id number]  1403-   Graeme Jude, MSW, LCSW Office: [phone] Cell: [phone] Fax: [phone]

## 2023-12-10 NOTE — Progress Notes (Signed)
 Physical Therapy Discharge Summary  Patient Details  Name: Zachary Underwood MRN: [id number] Date of Birth: Sep 16, 1927  Date of Discharge from PT service:December 10, 2023  Today's Date: 12/10/2023 PT Individual Time: 1118-1200 PT Individual Time Calculation (min): 42 min   Today's Date: 12/10/2023 PT Individual Time: 8551-8457 PT Individual Time Calculation (min): 54 min    Patient has met 9 of 9 long term goals due to improved activity tolerance, improved balance, improved postural control, and increased strength.  Patient to discharge at an ambulatory level Modified Independent.   Patient's care partner is independent to provide the necessary physical assistance at discharge.  Reasons goals not met: NA  Recommendation:  Patient will benefit from ongoing skilled PT services in home health setting to continue to advance safe functional mobility, address ongoing impairments in balance, ambulation, endurance, and minimize fall risk.  Equipment: No equipment provided  Reasons for discharge: treatment goals met and discharge from hospital  Patient/family agrees with progress made and goals achieved: Yes  Skilled Therapeutic Interventions: 1st Session: Pt received seated in recliner and agrees to therapy. Pt stands with rollator and ambulates x300' at [facility]). Pt takes extended seated rest break, pt then completes ramp navigation and car transfer without assistance. Pt ambulates x150' to main gym and completes x12 6 steps with bilateral handrails and cues for step sequencing and safety. Pt takes extended seated rest break. Pt ambulates x150' back to room with rollator at [facility]). Left seated in recliner with all needs within reach.   2nd Session: Pt received seated in recliner and agrees to therapy. No complaint of pain. Pt performs sit to stand with rollator and ambulates to toilet at mod(I). Pt's wife calls on phone and therapist speaks with wife regarding pt's mobility status and  recommendations for safe mobility following discharge. Pt then ambulates x100' to gym with rollator. Following rest break, pt performs x5 sit to stands without use of upper extremities to promote increased balance demand and loading through BLEs. Pt requires minA to promote anterior weight shifting. Following rest break, pt completes x5 reps sit to stand with use of arms, and does not require assistance to complete. Pt then completes x20 heel raises with upper extremity support on rollator. Pt performs x20 alternating high knee marches with upper extremity support on rollator. PT provides pt with printed HEP. Pt ambulates back to room with rollator. Left seated with all needs within reach.   PT Discharge Precautions/Restrictions Precautions Precautions: Fall;Cervical Required Braces or Orthoses: Cervical Brace Cervical Brace: At all times;Hard collar Restrictions Weight Bearing Restrictions Per Provider Order: No Pain Interference Pain Interference Pain Effect on Sleep: 1. Rarely or not at all Pain Interference with Therapy Activities: 1. Rarely or not at all Pain Interference with Day-to-Day Activities: 1. Rarely or not at all Vision/Perception  Vision - History Ability to See in Adequate Light: 0 Adequate Perception Perception: Within Functional Limits Praxis Praxis: WFL  Cognition Overall Cognitive Status: (P) Within Functional Limits for tasks assessed Arousal/Alertness: (P) Awake/alert Selective Attention: (P) Appears intact Memory: (P) Impaired Memory Impairment: (P) Decreased recall of new information;Decreased short term memory Awareness: (P) Appears intact Problem Solving: (P) Appears intact Safety/Judgment: (P) Appears intact Sensation Sensation Light Touch: Impaired Detail Central sensation comments: Occasional tingling through BUE Coordination Gross Motor Movements are Fluid and Coordinated: No Fine Motor Movements are Fluid and Coordinated: No Coordination and  Movement Description: generalized deconditioning Motor  Motor Motor: Other (comment) Motor - Skilled Clinical Observations: generalized weakness  Mobility  Bed Mobility Bed Mobility: Sit to Supine;Supine to Sit Supine to Sit: Independent with assistive device Sit to Supine: Independent with assistive device Transfers Transfers: Sit to Stand;Stand to Sit;Stand Pivot Transfers Sit to Stand: Independent with assistive device Stand to Sit: Independent with assistive device Stand Pivot Transfers: Independent with assistive device Transfer (Assistive device): Rollator Locomotion  Gait Ambulation: Yes Gait Assistance: Independent with assistive device Gait Distance (Feet): 300 Feet Assistive device: Rollator Gait Gait: Yes Gait Pattern: Impaired Gait Pattern: Decreased stride length;Decreased trunk rotation;Trunk flexed Stairs / Additional Locomotion Stairs: Yes Stairs Assistance: Supervision/Verbal cueing Stair Management Technique: Two rails Number of Stairs: 12 Height of Stairs: 6 Curb: Supervision/Verbal cueing Wheelchair Mobility Wheelchair Mobility: No  Trunk/Postural Assessment  Cervical Assessment Cervical Assessment: Exceptions to [facility] (cervical collar) Lumbar Assessment Lumbar Assessment: Exceptions to [facility] (posterior pelvic tilt) Postural Control Righting Reactions: delayed  Balance Static Sitting Balance Static Sitting - Level of Assistance: 7: Independent Dynamic Sitting Balance Dynamic Sitting - Level of Assistance: 6: Modified independent (Device/Increase time) Sitting balance - Comments: heavy UE support on arms of chair to pull forward, can sit once upright with S Static Standing Balance Static Standing - Balance Support: During functional activity;Bilateral upper extremity supported Static Standing - Level of Assistance: 6: Modified independent (Device/Increase time) Extremity Assessment  RLE Assessment RLE Assessment: Exceptions to [facility] General Strength  Comments: Grossly 4/5 LLE Assessment LLE Assessment: Exceptions to [facility] General Strength Comments: Grossly 4/5   [name], PT, DPT 12/10/2023, 5:18 PM

## 2023-12-10 NOTE — Progress Notes (Signed)
 Occupational Therapy Discharge Summary  Patient Details  Name: Zachary Underwood MRN: [id number] Date of Birth: October 07, 1927  Date of Discharge from OT service:December 10, 2023  Today's Date: 12/10/2023 OT Individual Time: 9084-8984 OT Individual Time Calculation (min): 60 min   1:1 Pt received in the recliner. Pt showered with overall distant supervision. Pt able to demonstrate toileting and dressing mod I with use of Rollator to navigate around the room. Pt decided to not wash hair or get head wet so collar pads didn't get wet so they didn't need to get changed. Pt did not recall that he can't doff collar in shower and that we don't have something to cover the whole collar while he was wearing it. Reviewed how he changed his pads before in the bed in sidelying. We also discussed shaving without collar on in the same manner. Pt with limited clean clothes today and needed a theraband belt to keep pants up. Pt ambulated mod I down the ADL apartment and simulated getting out items for making lunch. Pt able to safely get items from a lower drawer and a put from a lower cabinet, which he expressed he was worried about trying. His goal is to to be able to assist with making lunch for his wife and himself at home. Pt returned to room with mod I with rollator. Pt left sitting in the w/c in prep for going with transport to xray.  Patient has met 8 of 8 long term goals due to improved activity tolerance, improved balance, postural control, ability to compensate for deficits, and improved coordination.  Patient to discharge at overall supervision to mod I  level.  Patient's care partner , wife attended fam education is independent to provide the necessary physical and cognitive assistance at discharge.    Reasons goals not met: n/a  Recommendation:  Patient will benefit from ongoing skilled OT services in outpatient setting to continue to advance functional skills in the area of BADL and Reduce care partner  burden.  Equipment: No equipment provided  Reasons for discharge: treatment goals met and discharge from hospital  Patient/family agrees with progress made and goals achieved: Yes  OT Discharge Precautions/Restrictions  Precautions Precautions: Fall;Cervical Required Braces or Orthoses: Cervical Brace Cervical Brace: At all times;Hard collar Restrictions Weight Bearing Restrictions Per Provider Order: No  Pain  No c/o pain ADL ADL Eating: Modified independent Where Assessed-Eating: Chair Grooming: Modified independent Where Assessed-Grooming: Chair Upper Body Bathing: Setup Where Assessed-Upper Body Bathing: Shower Lower Body Bathing: Setup Where Assessed-Lower Body Bathing: Shower Upper Body Dressing: Modified independent (Device) Where Assessed-Upper Body Dressing: Sitting at sink Lower Body Dressing: Modified independent Where Assessed-Lower Body Dressing: Chair Toileting: Modified independent Where Assessed-Toileting: Teacher, adult education: Engineer, agricultural Method: Insurance claims handler: Unable to assess Film/video editor: Distant supervision Film/video editor Method: Designer, industrial/product: Grab bars Vision Baseline Vision/History: 0 No visual deficits Patient Visual Report: No change from baseline Vision Assessment?: No apparent visual deficits Perception  Perception: Within Functional Limits Praxis Praxis: WFL Cognition Cognition Overall Cognitive Status: Within Functional Limits for tasks assessed Arousal/Alertness: Awake/alert Orientation Level: Person;Place;Situation Person: Oriented Place: Oriented Situation: Oriented Memory: Impaired Memory Impairment: Decreased recall of new information;Decreased short term memory Selective Attention: (P) Appears intact Awareness: (P) Appears intact Problem Solving: Appears intact Safety/Judgment: Appears intact Brief Interview for Mental Status  (BIMS) Repetition of Three Words (First Attempt): 3 Temporal Orientation: Year: Correct Temporal Orientation: Month: Accurate within 5 days Temporal Orientation: Day:  Correct Recall: Sock: Yes, no cue required Recall: Blue: Yes, no cue required Recall: Bed: Yes, no cue required BIMS Summary Score: 15 Sensation Sensation Light Touch: Impaired Detail Central sensation comments: Occasional tingling through BUE Coordination Gross Motor Movements are Fluid and Coordinated: No Fine Motor Movements are Fluid and Coordinated: Yes Coordination and Movement Description: generalized deconditioning Finger Nose Finger Test: WNL Motor  Motor Motor: Other (comment) Motor - Skilled Clinical Observations: generalized weakness Motor - Discharge Observations: improved overall from eval Mobility  Bed Mobility Bed Mobility: Sit to Supine;Supine to Sit Supine to Sit: Independent with assistive device Sit to Supine: Independent with assistive device Transfers Sit to Stand: Independent with assistive device Stand to Sit: Independent with assistive device  Trunk/Postural Assessment  Cervical Assessment Cervical Assessment: Exceptions to [facility] (cervical collar) Lumbar Assessment Lumbar Assessment: Exceptions to [facility] (posterior pelvic tilt) Postural Control Righting Reactions: delayed  Balance Static Sitting Balance Static Sitting - Level of Assistance: 7: Independent Dynamic Sitting Balance Dynamic Sitting - Level of Assistance: 6: Modified independent (Device/Increase time) Sitting balance - Comments: heavy UE support on arms of chair to pull forward, can sit once upright with S Static Standing Balance Static Standing - Balance Support: During functional activity;Bilateral upper extremity supported Static Standing - Level of Assistance: 6: Modified independent (Device/Increase time) Extremity/Trunk Assessment RUE Assessment General Strength Comments: 4-/5. About 100 degrees flex LUE  Assessment General Strength Comments: 4-/5. About 90 degrees flex   Zachary Underwood [facility] 12/10/2023, 1:25 PM

## 2023-12-10 NOTE — Plan of Care (Signed)
  Problem: RH Swallowing Goal: LTG Patient will consume least restrictive diet using compensatory strategies with assistance (SLP) Description: LTG:  Patient will consume least restrictive diet using compensatory strategies with assistance (SLP) Outcome: Completed/Met Goal: LTG Pt will demonstrate functional change in swallow as evidenced by bedside/clinical objective assessment (SLP) Description: LTG: Patient will demonstrate functional change in swallow as evidenced by bedside/clinical objective assessment (SLP) Outcome: Completed/Met   Problem: RH Pre-functional/Other (Specify) Goal: RH LTG SLP (Specify) 1 Description: RH LTG SLP (Specify) 1 Outcome: Completed/Met   Problem: RH Pre-functional/Other (Specify) Goal: RH LTG SLP (Specify) 2 Description: RH LTG SLP (Specify) 2 Outcome: Completed/Met

## 2023-12-10 NOTE — Progress Notes (Addendum)
 PROGRESS NOTE   Subjective/Complaints: No events overnight.  No acute complaints this morning.  Does state that he had a couple episodes of anxiety in the last 24 hours, quickly self resolved with breathing exercises, but is asking about management at home and neuropsychiatric evaluation while at inpatient.  Blood pressure stable/downtrending, likely due to reduced steroids.  Other vitals are stable. No reported pain overnight  ROS: Denies fevers, chills, N/V, abdominal pain, constipation, diarrhea, chest pain, new weakness or paraesthesias.   + Dysphagia/dysarthria--improving + Intermittent shortness of breath and anxiety- ongoing, intermittent Objective:   No results found.  Recent Labs    12/09/23 0604  WBC 12.0*  HGB 13.0  HCT 39.2  PLT 228    Recent Labs    12/09/23 0604  NA 138  K 4.2  CL 106  CO2 23  GLUCOSE 110*  BUN 49*  CREATININE 1.21  CALCIUM  9.6     Intake/Output Summary (Last 24 hours) at 12/10/2023 0942 Last data filed at 12/10/2023 0800 Gross per 24 hour  Intake 240 ml  Output 250 ml  Net -10 ml        Physical Exam: Vital Signs Blood pressure (!) 145/65, pulse 69, temperature 97.6 F (36.4 C), temperature source Oral, resp. rate 18, height 5' 8 (1.727 m), weight 76.6 kg, SpO2 98%.    General: No apparent distress.  Sitting up in bedside chair. HEENT: EOMI.  PERRLA.  No nystagmus.  Head is normocephalic. C-spine brace in place  Neck: Supple without JVD or lymphadenopathy.   Heart:   Regular rate, irregular rhythm.  + systolic ejection murmur  Chest: CTA bilaterally without wheezes, rales, or rhonchi; no distress.     Abdomen: Soft, non-tender, non-distended, bowel sounds positive. Extremities: Trace + bilateral lower extremity edema Psych: Pt's affect is appropriate. Pt is cooperative.  Skin:   + L shoulder - Chronic Lipoma--stable  + Right anterior scalp laceration --not  examined, dressing changed 8-18  Neuro:   Mental Status: No obvious cognitive deficits.   Speech/Languate: Intact, reducing dysarthria.  100% intelligible. CRANIAL NERVES: 2 through 12 grossly intact   MOTOR: Moving all 4 limbs antigravity, against resistance 5/5 throughout bilateral upper and lower extremities   SENSORY: No apparent deficits  Coordination: No ataxia, no tremor  MSK: Right shoulder abduction limited to less than 90 degrees--no apparent deformity.  Assessment/Plan: 1. Functional deficits which require 3+ hours per day of interdisciplinary therapy in a comprehensive inpatient rehab setting. Physiatrist is providing close team supervision and 24 hour management of active medical problems listed below. Physiatrist and rehab team continue to assess barriers to discharge/monitor patient progress toward functional and medical goals  Care Tool:  Bathing    Body parts bathed by patient: Right arm, Left arm, Abdomen, Chest, Front perineal area, Buttocks, Right upper leg, Left upper leg, Face, Right lower leg, Left lower leg   Body parts bathed by helper: Right lower leg, Left lower leg     Bathing assist Assist Level: Supervision/Verbal cueing     Upper Body Dressing/Undressing Upper body dressing   What is the patient wearing?: Pull over shirt    Upper body assist Assist Level: Supervision/Verbal  cueing    Lower Body Dressing/Undressing Lower body dressing    Lower body dressing activity did not occur: Refused What is the patient wearing?: Pants     Lower body assist Assist for lower body dressing: Supervision/Verbal cueing     Toileting Toileting    Toileting assist Assist for toileting: Supervision/Verbal cueing     Transfers Chair/bed transfer  Transfers assist     Chair/bed transfer assist level: Supervision/Verbal cueing     Locomotion Ambulation   Ambulation assist      Assist level: Supervision/Verbal cueing Assistive device:  Rollator Max distance: 200   Walk 10 feet activity   Assist     Assist level: Supervision/Verbal cueing Assistive device: Rollator   Walk 50 feet activity   Assist    Assist level: Supervision/Verbal cueing Assistive device: Rollator    Walk 150 feet activity   Assist    Assist level: Supervision/Verbal cueing Assistive device: Rollator    Walk 10 feet on uneven surface  activity   Assist     Assist level: Minimal Assistance - Patient > 75% Assistive device: Walker-rolling   Wheelchair     Assist Is the patient using a wheelchair?: Yes Type of Wheelchair: Manual      Max wheelchair distance: 150'    Wheelchair 50 feet with 2 turns activity    Assist        Assist Level: Dependent - Patient 0%   Wheelchair 150 feet activity     Assist      Assist Level: Dependent - Patient 0%   Blood pressure (!) 145/65, pulse 69, temperature 97.6 F (36.4 C), temperature source Oral, resp. rate 18, height 5' 8 (1.727 m), weight 76.6 kg, SpO2 98%.  Medical Problem List and Plan: 1. Functional deficits secondary to type II posterior displaced odontoid fracture after a fall             -patient may shower; maintain cervical collar 24/7             -ELOS/Goals: 5-7 days, PT/OT/SLP Mod I goals-- 8/20 DC date             -Pts wife would like to be notified before any medication changes  Continue CIR    - 8/7: Dr. Burnetta with ortho spine provided second opinion; recommendations  - Hard cervical collar at all times.  - reevaluate the fracture with x-rays in 4 weeks. - - Recommend he continue to use Celebrex  and muscle relaxer as this is controlling his pain.  - follow-up with me in 4 weeks.  - 8/7: Provided list of PRN medications to patient  - 8/12: Reported caregiver at home was bathing and dressing him. Currently full SPV most ADLs, limited by SOB/symptomatic bradycardia episodes when mobilized--goals Mod I. SPV bed mobility/transfers, CGA with  rollator 150 ft. Goals SPV. Limited by endurance. 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    - Family meeting with patient, wife, nursing admin, hospital admin, Architectural technologist, and medical team  today to discuss patient care -- provided appropriate boundaries with staff (ex. Not following nurses in hallway), reasonable expectations for medical updates, and patient advocacy - provided physician email and office # and discussed physician schedule, NP availability and limitations for medical updates/communication. Wife, team in agreement on minimizing stress with nursing surrounding attending to the patient and his needs. Emphasized OK to seek urgent care if emergency is suspected.    -  Will  get nursing to provide call button due to difficulty finding call bell with C collar   - Due to anxiety and fear of falling exacerbated by posterior lean, along with ongoing surgical precautions with high risk of odontoid displacement with malpositioning, feel patient would benefit from a hospital bed to facilitate head elevation and improve bed mobility and independence.   8/18: Neck xray prior to discharge--ordered for 8-19--stable alignment compared to prior   8/19: Goal level for OT, PT - Mod I to SPV level. Patient should not drive on return home d/t reduced neck ROM. Keep dysphagia 3 diet on discharge  - can have regular diet at home (including raw vegetables) with larger pills being crushed.   2.  Impaired mobility: messaged Tomeka to please walk with him if she has time -DVT/anticoagulation:  Mechanical: continue Sequential compression devices, below knee Bilateral lower extremities Pharmaceutical: Xarelto              -antiplatelet therapy: N/A  3. Pain Management: continue Celebrex  (monitor Cr) Robaxin , oxycodone  as needed             - Left knee pain-received cortisone injection by orthopedics  - 8-8: Switch to tramadol  50 mg for moderate pain and  oxycodone  2.5 mg for severe pain every 6 hours as needed.  Continue Robaxin , Celebrex .  Nursing to inquire about location of cold packs patient had on prior floor.  8/9: Cold therapy Prn for shoulders, head  8/10 discussed with patient's wife, patient does not ask for tramadol  as needed.  Will schedule twice a day--tolerating  8-11: Left-sided headache.  Patient refusing ice packs.  Per report, tramadol  not effective; changed to as needed.  Patient has Celebrex , oxycodone  available.  Allergic to Tylenol, could consider Topamax but cautious to oversedate.  Continue current regimen for now.  8/12: D/w wife, requests resumption of scheduled celebrex  200 mg BID understanding may result in recurrent AKI. He has been tolerating once daily up to this point - will trial and check labs Thursday.  - DC tramadol .  - Discussed Fioricet, Journavix as alternative PRNS for headache-- family wishes to hold off at this time.   8/13: minimal pain on this regimen; denies any headaches 8-14: Renal function is tolerating Celebrex ; continue 8/18: BUN 49; DC oxycodone  due to no recent use 8-19: Change celebrex  to 200 mg daily+ 200 mg as needed extra daily  4. Mood/Behavior/Sleep: LCSW to follow for evaluation and support when available.              -antipsychotic agents: N/A  - 8-8.  Benadryl  25 mg nightly as needed for sleep; per wife, patient uses this at home--has not requested, sleeping well per nursing  - 8/12: 1x PRN 0.125 xanax  today with symptomatic improvement but significant lethargy; maintain minimal dosing only, remove 0.25 PRN option and reduce to BID PRN  8/14: Not requiring Xanax  currently; will monitor and potentially DC before the weekend  8-15: 4 episodes of anxiety today, family requesting more frequent Xanax  since he is tolerating it well; emphasized this will need to be picked up by PCP at discharge if he is can to continue, changed to every 2 hours as needed, continue Xanax   8-18: No episodes  reported this a.m.  Last Xanax  use 8-16.  Appears to be improving, will give short course to bridge at discharge and defer further to PCP.  8-19: Discussed initiation of Wellbutrin  on discharge for anxiety indication; will start with 150 mg XR daily.  Checking with  Dr. Corina today regarding possibility of neuropsych evaluation prior to discharge.  5. Neuropsych/cognition: This patient is capable of making decisions on his own behalf.  6. Skin/Wound Care: routine pressure relief measures -[place] J collar in place, monitor for changes in character of skin  -scalp laceration present on admission --stable appearance - 8-15: Complaints of itching under cervical collar. Continue Eucerin/hydrocortisone cream 1-1--symptomatic improvement 8-18  7. Fluids/Electrolytes/Nutrition: monitor I/O and repeat labs in a.m.              - Dysphagia 3 with nectar thick liquids--continue aspiration precautions. Continue SLP   - 8/8: Eating 50 to 100% of meals; on  Free water protocol for hydration.  Albumin 2.9, will encourage p.o. intakes, appreciate nutrition assistance.  8-11: Labs stable.  Albumin improved from 2.9-3.2.  Cleared for thin fluids today.  8/12: patient refused all meals 8/11; 100% breakfast this AM. Will monitor today - if continues to be poor will get nutritional consult  8-13: P.o. intakes picked up , okay to monitor for now  8-19: Reviewed vitamin D , low normal range.  Start Os-Cal with vitamin D  supplementation 1 tab twice daily for 6 weeks for fracture prevention   8. HTN: resumed home irbesartan --no BB due to bradycardia  - 8-8: Add as needed hydralazine  10 milligrams every 6 hours for SBP greater than 160, DBP greater than 100.  Increased blood pressure likely due to pain, monitor today may increase irbesartan  if renal function improves tomorrow.  - BP a little labile, continue to monitor trend and continue current medications  8/11: HTN with therapies, bradycardia - ?contribution of  pain/headache vs. Anxiety. Self-resolves once calm.   8/13: HTN 140-160s; increase metolazone  to 5 mg daily--improved  - Blood pressure in a stable range, 130s to 150s.    12/10/2023    6:00 AM 12/10/2023    5:15 AM 12/10/2023    5:10 AM  Vitals with BMI  Weight 168 lbs 14 oz  165 lbs 9 oz  BMI 25.68  25.18  Systolic  145   Diastolic  65   Pulse  69     9.  Chronic diastolic CHF/chronic lower extremity edema: patient's wife requested metolazone  and not Demadex .              -Currently on metolazone  2.5 mg daily, BNP 261-- wife says this CAN NOT BE REMOVED   - Daily weights and volume assessments.  - 8-11: Volume assessments and symptoms without signs of volume overload; chest x-ray 8-9 with improved pulmonary edema.  Weights downtrending while albumin is improving.  Monitor.  8/12: Stable cardiomegaly, pleural effusions on CXR--no external s/s volume overload; trend weights. May need to increase metolazone .   8/13: Weights uptrending, BP elevated - increase metolazone  to 5 mg daily; already discussed as potential with patient's wife, informed nursing to notify family when they get in.  Weight reviewed and is stable  8-18: Weight somewhat variable, continue to monitor.   8-19: Blood pressure coming down as he weans off steroids, will resume home dose of metolazone  2.5 mg on discharge  Filed Weights   12/09/23 0500 12/10/23 0510 12/10/23 0600  Weight: 79.2 kg 75.1 kg 76.6 kg    10. HLD: continue home atorvastatin   11.  Mild anemia: Continue to monitor CBC recheck labs in a.m.  - 8-11 hemoglobin uptrending 11.4--12.0 on 8-14  8-18: Resolved, hemoglobin 13  12.  AKI: Creatinine 1.38 on admission baseline approximately 1.0--trend creatinine. Last Cr 1.52. Recheck tomorrow.  -  8-8: Creatinine 1.5, BUN slightly increased.  Plan for IV fluids very gentle normal saline at 50 cc/h for 10 hours.  Repeat BMP in AM.  Did discuss with wife that if AKI does not improve, may need to adjust  scheduled Celebrex  to as needed.  -8/9 Cr a little better at 1.51, change celebrex  to PRN  -8/11 - BUN improving, Cr normalized   8-18: BUN back up to 49, creatinine stable.  13. Obesity             Body mass index is 30.3 kg/m.  14. Bradycardia/atrial fibrillation             -Pt was evaluated by cardiology, no indication for temporary pacing   - Regular rate  -8/9 HR stable currently, nursing reports occasional bradycardic episodes at night, pt was evaluated by cardiology for this during acute admission  8-11: Reported episode of bradycardia 30s to 50s during therapies today; self resolved, patient asymptomatic.  If recurs again, will get EKG.  8/12: 2x episode SOB and bradycardia 30-50s with therapy this AM; EKG showed ?a flutter with ventricular rate 40s. Re-consulted cardiology for symptomatic bradycardia, added PRN atropine  0.5 mg IV (for use with rapid response if needed); not candidate for pacemake with last evaluation 8/13: Per EP: If he has episodes of syncope related to bradycardia, pacemaker implant would be reasonable. As he does have a c-collar in place, we will hold off for now. He can follow-up with general cardiology.   15. Hx of aortic aneurysm/dissection -f/u outpatient, BP control   -Blood pressure management as above, has as needed hydralazine   16. Aortic stenosis             -Repeat echo 6-12 months has been recommended  17. L knee pain             -L knee injection completed by ortho 8/1--no current complaints of pain  18.  Right shoulder pain--no complaints on exam 8-11  19.  Dysphagia/secretion management  - Mucinex  600 mg twice daily  - Robitussin DM as needed  - DC Claritin  10 mg daily  -Was placed on Unasyn  --> Augmentin  for suspected aspiration, chest x-ray 8-9 improved without opacity; finish antibiotics at 10 days total  -8-8: Patient managing secretions well on current exam, no current rhonchi or wheezing, no concerns for aspiration.  Monitor oxygen  and respiratory status closely.  Can add scopolamine patch if needed, but would hold off due to multiple anticholinergic medications unless absolutely needed.  -8/9  Restart claritin -has allergies hx, discussed increased use of suction  -8/10 nasal congestion no better--Flonase  BID started 8/8  8-11: MBS today with pill dysphagia, otherwise cleared for thin liquids.  Possible prevertebral edema noted at C2, discussed with Dr. Georgina with orthopedics, will start Decadron  4 mg twice daily x 5 days with possible taper to see if this improves dysphagia  8/12: POs better today; monitor as above  8-14: Continues to improve; wife concerned regarding ongoing secretions and dysarthria, wants to discuss increasing steroid regimen with neurosurgery but does not want to involve Dr. Lanis  - Dr. Gillie called back to provide input; would not increase steroid regimen, no additional interventions offered.  Appreciate his expertise.   - 8-15: Finishing up Decadron  4 mg twice daily; will provide gradual taper of 2 mg twice daily for 2 days and 1 mg twice daily for 1 day prior to discharge  20. Occasional SOB and Anxiety, ?vertigo/fear of falling   -Suspect most  likely anxiety but secretions could be contributing. Will check CXR-negative for acute changes, Consider prn anxiety medication    -start xanax  0.25 PRN, discussed with his wife  - 80/10 patient's wife would like to have option for 0.125 dose of Xanax  as needed.  Order adjusted   -Schedule for neuropscyh- next available --discussed patient case with Dr. Corina 8-11  - 8/12: Recurrent, associated with bradycardia and HTN; See #4, #14 above. Also with bed positional changes. May benefit from Buspar trial, family wishes to defer at this time. Offered atarax as alternative to xanax , family defers at this time. Discussed limitations of audiology consult for vertigo; offered COR testing however patient has already seen ENT for OP evaluation for this.   8-13:  No intervention per EP, still occurring primarily with head going back; resolves quickly with use of incentive spirometer  --See #4 above.  8-18: Seems to be improving--discharge with temporary prescription for Xanax , Wellbutrin  as above  LOS: 12 days A FACE TO FACE EVALUATION WAS PERFORMED  Zachary Underwood 12/10/2023, 9:42 AM

## 2023-12-10 NOTE — Patient Care Conference (Signed)
 Inpatient RehabilitationTeam Conference and Plan of Care Update Date: 12/10/2023   Time: 1002 am    Patient Name: Zachary Underwood      Medical Record Number: [id number]  Date of Birth: 1927-09-25 Sex: Male         Room/Bed: 4W08C/4W08C-01 Payor Info: Payor: MEDICARE / Plan: MEDICARE PART A AND B / Product Type: *No Product type* /    Admit Date/Time:  11/28/2023  4:11 PM  Primary Diagnosis:  Cervical spine fracture [facility])  Hospital Problems: Principal Problem:   Cervical spine fracture (HCC) Active Problems:   Hyperlipidemia   Primary hypertension   Lower extremity edema   Bradycardia   Chronic atrial fibrillation (HCC)   Chronic diastolic CHF (congestive heart failure) (HCC)   Left knee pain   Dysphagia   Anemia   Chronic diastolic congestive heart failure [facility])    Expected Discharge Date: Expected Discharge Date: 12/11/23  Team Members Present: Physician leading conference: Dr. Joesph Likes Social Worker Present: Graeme Jude, LCSW Nurse Present: Eulalio Falls, RN PT Present: Kirt Dawn, PT OT Present: Nereida Habermann, OT SLP Present: Recardo Mole, SLP PPS Coordinator present : Eleanor Colon, SLP     Current Status/Progress Goal Weekly Team Focus  Bowel/Bladder   Continent of Bowel/Bladder   Remain continent during length of stay.   Toilet q 4 hours while awake and PRN.    Swallow/Nutrition/ Hydration   Dys 3/thin, @ home can have small pills whole and larger pills crushed in puree   modI  pt/family education, IOPI, EMST150    ADL's   Supervision-Mod I for full-body ADLS.   Mod I overall   Discharge planning    Mobility   bed mobility supervision, sit to stand supervision, gait >150' with rollator   supervision w/ transfers and gait up to 150' w/ rollator.  dc prep    Communication                Safety/Cognition/ Behavioral Observations  modI to carryover education       pt/fmaily education, EMST150    Pain   Pt denies any pain at  this time.   Pain score to remain less than or equal to 3.   Assess pain q shift and PRN.    Skin   Laceration top of head with foam dressing.   Remain free from skin breakdown and s/sx of infection.  Assess skin q shift and PRN.      Discharge Planning:  D/c to home with his wife who can only provide supervison level of care. Pt will need to be as independent as possible. [facility]- [facility] for HHPT/OT/aide. Fam edu completed on Friday. Will have 16hrs per week through [place] aide program.  SW will confirm there are no barriers to discharge.    Team Discussion: Patient was admitted post fall with displaced odontoid fracture. Patient with pain/anxiety/ high blood pressure: medications adjusted by MD.   Patient on target to meet rehab goals: yes, patient at goal level with PT/OT/ST - Mod I to supervision level. Patient will remain on dysphagia 3/thin diet.  Overall goals at discharge  are set for mod I -supervision assistance.  *See Care Plan and progress notes for long and short-term goals.   Revisions to Treatment Plan:  EMST Cervical collar   Teaching Needs: Safety, medications , dietary modifications, transfers, toileting, etc.   Current Barriers to Discharge: Decreased caregiver support, Home enviroment access/layout, and Weight  Possible Resolutions to Barriers: Family education Outpatient  follow up      Medical Summary Current Status: medically complicated by hypertension, CHF, neck pain, headaches, bradycardia/atria fibrillation, AKI, dysphagia, and anxiety  Barriers to Discharge: Cardiac Complications;Medical stability;Self-care education;Uncontrolled Hypertension;Uncontrolled Pain;Weight bearing restrictions;Pending surgery/plan   Possible Resolutions to [employer]: titrate pain medications to minimum tolerated doses for function, monitor vitals and increase BP regimen as approrpiate, monitor labs and adjust medications for AKI, monitor anxiety, taper IV  steroids   Continued Need for Acute Rehabilitation Level of Care: The patient requires daily medical management by a physician with specialized training in physical medicine and rehabilitation for the following reasons: Direction of a multidisciplinary physical rehabilitation program to maximize functional independence : Yes Medical management of patient stability for increased activity during participation in an intensive rehabilitation regime.: Yes Analysis of laboratory values and/or radiology reports with any subsequent need for medication adjustment and/or medical intervention. : Yes   I attest that I was present, lead the team conference, and concur with the assessment and plan of the team.   Denzil Mceachron Gayo 12/10/2023, 1002 am

## 2023-12-11 ENCOUNTER — Other Ambulatory Visit (HOSPITAL_COMMUNITY): Payer: Self-pay

## 2023-12-11 ENCOUNTER — Telehealth (HOSPITAL_COMMUNITY): Payer: Self-pay | Admitting: Pharmacy Technician

## 2023-12-11 DIAGNOSIS — S12100A Unspecified displaced fracture of second cervical vertebra, initial encounter for closed fracture: Secondary | ICD-10-CM

## 2023-12-11 MED ORDER — TRIAMCINOLONE ACETONIDE 0.1 % EX CREA
1.0000 | TOPICAL_CREAM | Freq: Two times a day (BID) | CUTANEOUS | 0 refills | Status: AC | PRN
  Filled 2023-12-11: qty 30, 15d supply, fill #0

## 2023-12-11 NOTE — Telephone Encounter (Signed)
 Pharmacy Patient Advocate Encounter  Received notification from [facility] that Prior Authorization for Lidocaine  5% patches  has been DENIED.  Full denial letter will be uploaded to the media tab. See denial reason below. Denied due to absence of required prior authorization criteria for LIDOCAINE  Patch use in specified pain conditions.   PA #/Case ID/Reference #: [id number]

## 2023-12-11 NOTE — Progress Notes (Signed)
 Inpatient Rehabilitation Care Coordinator Discharge Note   Patient Details  Name: Zachary Underwood MRN: [id number] Date of Birth: 02-Apr-1928   Discharge location: D/c to home  Length of Stay: 12 days  Discharge activity level: Mod I  Home/community participation: Limited  Patient response un:Yzjouy Literacy - How often do you need to have someone help you when you read instructions, pamphlets, or other written material from your doctor or pharmacy?: Rarely  Patient response un:Dnrpjo Isolation - How often do you feel lonely or isolated from those around you?: Rarely  Services provided included: MD, RD, PT, OT, SLP, RN, TR, Pharmacy, Neuropsych, SW, CM  Financial Services:  Field seismologist Utilized: Medicare    Choices offered to/list presented to: patient wife  Follow-up services arranged:  Home Health, DME Home Health Agency: [facility]OT[facility]SLP[facility]aide    [facility], grab bars, urinals and 3in1 BSC    Patient response to transportation need: Is the patient able to respond to transportation needs?: Yes In the past 12 months, has lack of transportation kept you from medical appointments or from getting medications?: No In the past 12 months, has lack of transportation kept you from meetings, work, or from getting things needed for daily living?: No   Patient/Family verbalized understanding of follow-up arrangements:  Yes  Individual responsible for coordination of the follow-up plan: contact pt [name]  Confirmed correct DME delivered: [name] 12/11/2023    Comments (or additional information):fam edu completed  Summary of Stay    Date/Time Discharge Planning CSW  12/09/23 1130 D/c to home with his wife who can only provide supervison level of care. Pt will need to be as independent as possible. HHA- [facility] for HHPT/OT/aide. Fam edu completed on Friday. Will have 16hrs per week through [place] aide program.  SW will confirm there are no  barriers to discharge. AAC  12/02/23 1529 D/c to home with his wife who can only provide supervison level of care. Pt will need to be as independent as possible. SW will confirm there are no barriers to discharge. AAC       Ayako Tapanes A Jude

## 2023-12-11 NOTE — Progress Notes (Signed)
 PROGRESS NOTE   Subjective/Complaints: No events overnight.  Communicated with patient's wife over the phone and via private institutional email yesterday regarding discharge instructions, coordination. Patient feeling prepared for discharge today.  ROS: Denies fevers, chills, N/V, abdominal pain, constipation, diarrhea, chest pain, new weakness or paraesthesias.   + Dysphagia/dysarthria--improving + Intermittent shortness of breath and anxiety- ongoing, intermittent Objective:   DG Cervical Spine 2 or 3 views Result Date: 12/10/2023 CLINICAL DATA:  C2 fracture. EXAM: CERVICAL SPINE - 2-3 VIEW COMPARISON:  November 28, 2023.  November 22, 2023. FINDINGS: Grossly stable appearance of dens fracture with proximally 10 mL of posterior displacement. C1 posterior fracture is noted as described on prior CT scan. Minimal grade 1 anterolisthesis of C4-5 and C5-6 is noted which is unchanged. IMPRESSION: Grossly stable appearance of dens fracture as described above. Electronically Signed   By: Lynwood Landy Raddle M.D.   On: 12/10/2023 11:08    Recent Labs    12/09/23 0604  WBC 12.0*  HGB 13.0  HCT 39.2  PLT 228    Recent Labs    12/09/23 0604  NA 138  K 4.2  CL 106  CO2 23  GLUCOSE 110*  BUN 49*  CREATININE 1.21  CALCIUM  9.6     Intake/Output Summary (Last 24 hours) at 12/11/2023 0821 Last data filed at 12/11/2023 0816 Gross per 24 hour  Intake 240 ml  Output 650 ml  Net -410 ml        Physical Exam: Vital Signs Blood pressure (!) 121/58, pulse 61, temperature 98 F (36.7 C), temperature source Oral, resp. rate 18, height 5' 8 (1.727 m), weight 77.8 kg, SpO2 97%.    General: No apparent distress.  Sitting up in bedside chair. HEENT: EOMI.  PERRLA.  No nystagmus.  Head is normocephalic. C-spine brace in place  Neck: Supple without JVD or lymphadenopathy.   Heart:   Regular rate, irregular rhythm.  + systolic ejection murmur   Chest: CTA bilaterally without wheezes, rales, or rhonchi; no distress.     Abdomen: Soft, non-tender, non-distended, bowel sounds positive. Extremities: Trace + bilateral lower extremity edema Psych: Pt's affect is appropriate. Pt is cooperative.  Skin:   + L shoulder - Chronic Lipoma--stable  + Right anterior scalp laceration --not examined, dressing changed 8-18  Neuro:   Mental Status: No obvious cognitive deficits.   Speech/Languate: Intact, reducing dysarthria.  100% intelligible. CRANIAL NERVES: 2 through 12 grossly intact   MOTOR: Moving all 4 limbs antigravity, against resistance 5/5 throughout bilateral upper and lower extremities   SENSORY: No apparent deficits  Coordination: No ataxia, no tremor  MSK: Right shoulder abduction limited to less than 90 degrees--no apparent deformity.  Assessment/Plan: 1. Functional deficits which require 3+ hours per day of interdisciplinary therapy in a comprehensive inpatient rehab setting. Physiatrist is providing close team supervision and 24 hour management of active medical problems listed below. Physiatrist and rehab team continue to assess barriers to discharge/monitor patient progress toward functional and medical goals  Care Tool:  Bathing    Body parts bathed by patient: Right arm, Left arm, Abdomen, Chest, Front perineal area, Buttocks, Right upper leg, Left upper leg, Face, Right  lower leg, Left lower leg   Body parts bathed by helper: Right lower leg, Left lower leg     Bathing assist Assist Level: Set up assist     Upper Body Dressing/Undressing Upper body dressing   What is the patient wearing?: Button up shirt, Orthosis (remains on at all times)    Upper body assist Assist Level: Supervision/Verbal cueing    Lower Body Dressing/Undressing Lower body dressing    Lower body dressing activity did not occur: Refused What is the patient wearing?: Underwear/pull up, Incontinence brief     Lower body assist  Assist for lower body dressing: Independent with assitive device     Toileting Toileting    Toileting assist Assist for toileting: Independent with assistive device     Transfers Chair/bed transfer  Transfers assist     Chair/bed transfer assist level: Independent with assistive device     Locomotion Ambulation   Ambulation assist      Assist level: Independent with assistive device Assistive device: Rollator Max distance: 300'   Walk 10 feet activity   Assist     Assist level: Independent with assistive device Assistive device: Rollator   Walk 50 feet activity   Assist    Assist level: Independent with assistive device Assistive device: Rollator    Walk 150 feet activity   Assist    Assist level: Independent with assistive device Assistive device: Rollator    Walk 10 feet on uneven surface  activity   Assist     Assist level: Independent with assistive device Assistive device: Rollator   Wheelchair     Assist Is the patient using a wheelchair?: No Type of Wheelchair: Manual      Max wheelchair distance: 150'    Wheelchair 50 feet with 2 turns activity    Assist        Assist Level: Dependent - Patient 0%   Wheelchair 150 feet activity     Assist      Assist Level: Dependent - Patient 0%   Blood pressure (!) 121/58, pulse 61, temperature 98 F (36.7 C), temperature source Oral, resp. rate 18, height 5' 8 (1.727 m), weight 77.8 kg, SpO2 97%.  Medical Problem List and Plan: 1. Functional deficits secondary to type II posterior displaced odontoid fracture after a fall             -patient may shower; maintain cervical collar 24/7             -ELOS/Goals: 5-7 days, PT/OT/SLP Mod I goals-- 8/20 DC date             -Pts wife would like to be notified before any medication changes  Continue CIR    - 8/7: Dr. Burnetta with ortho spine provided second opinion; recommendations  - Hard cervical collar at all times.   - reevaluate the fracture with x-rays in 4 weeks. - - Recommend he continue to use Celebrex  and muscle relaxer as this is controlling his pain.  - follow-up with me in 4 weeks.  - 8/7: Provided list of PRN medications to patient  - 8/12: Reported caregiver at home was bathing and dressing him. Currently full SPV most ADLs, limited by SOB/symptomatic bradycardia episodes when mobilized--goals Mod I. SPV bed mobility/transfers, CGA with rollator 150 ft. Goals SPV. Limited by endurance. 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    - Family meeting with patient, wife, nursing admin, hospital admin, Architectural technologist, and  medical team  today to discuss patient care -- provided appropriate boundaries with staff (ex. Not following nurses in hallway), reasonable expectations for medical updates, and patient advocacy - provided physician email and office # and discussed physician schedule, NP availability and limitations for medical updates/communication. Wife, team in agreement on minimizing stress with nursing surrounding attending to the patient and his needs. Emphasized OK to seek urgent care if emergency is suspected.    -  Will get nursing to provide call button due to difficulty finding call bell with C collar   - Due to anxiety and fear of falling exacerbated by posterior lean, along with ongoing surgical precautions with high risk of odontoid displacement with malpositioning, feel patient would benefit from a hospital bed to facilitate head elevation and improve bed mobility and independence.   8/18: Neck xray prior to discharge--ordered for 8-19--stable alignment compared to prior   8/19: Goal level for OT, PT - Mod I to SPV level. Patient should not drive on return home d/t reduced neck ROM. Keep dysphagia 3 diet on discharge  - can have regular diet at home (including raw vegetables) with larger pills being crushed.   The patient is medically ready for  discharge to home and will need follow[facility] with [facility]. In addition, they will need to follow up with their PCP, Orthopedics (spine).    2.  Impaired mobility: messaged Tomeka to please walk with him if she has time -DVT/anticoagulation:  Mechanical: continue Sequential compression devices, below knee Bilateral lower extremities Pharmaceutical: Xarelto              -antiplatelet therapy: N/A  3. Pain Management: continue Celebrex  (monitor Cr) Robaxin , oxycodone  as needed             - Left knee pain-received cortisone injection by orthopedics  - 8-8: Switch to tramadol  50 mg for moderate pain and oxycodone  2.5 mg for severe pain every 6 hours as needed.  Continue Robaxin , Celebrex .  Nursing to inquire about location of cold packs patient had on prior floor.  8/9: Cold therapy Prn for shoulders, head  8/10 discussed with patient's wife, patient does not ask for tramadol  as needed.  Will schedule twice a day--tolerating  8-11: Left-sided headache.  Patient refusing ice packs.  Per report, tramadol  not effective; changed to as needed.  Patient has Celebrex , oxycodone  available.  Allergic to Tylenol, could consider Topamax but cautious to oversedate.  Continue current regimen for now.  8/12: D/w wife, requests resumption of scheduled celebrex  200 mg BID understanding may result in recurrent AKI. He has been tolerating once daily up to this point - will trial and check labs Thursday.  - DC tramadol .  - Discussed Fioricet, Journavix as alternative PRNS for headache-- family wishes to hold off at this time.   8/13: minimal pain on this regimen; denies any headaches 8-14: Renal function is tolerating Celebrex ; continue 8/18: BUN 49; DC oxycodone  due to no recent use 8-19: Change celebrex  to 200 mg daily+ 200 mg as needed extra daily  4. Mood/Behavior/Sleep: LCSW to follow for evaluation and support when available.              -antipsychotic agents: N/A  - 8-8.  Benadryl  25 mg nightly as needed for  sleep; per wife, patient uses this at home--has not requested, sleeping well per nursing  - 8/12: 1x PRN 0.125 xanax  today with symptomatic improvement but significant lethargy; maintain minimal dosing only, remove 0.25 PRN option and reduce to BID  PRN  8/14: Not requiring Xanax  currently; will monitor and potentially DC before the weekend  8-15: 4 episodes of anxiety today, family requesting more frequent Xanax  since he is tolerating it well; emphasized this will need to be picked up by PCP at discharge if he is can to continue, changed to every 2 hours as needed, continue Xanax   8-18: No episodes reported this a.m.  Last Xanax  use 8-16.  Appears to be improving, will give short course to bridge at discharge and defer further to PCP.  8-19: Discussed initiation of Wellbutrin  on discharge for anxiety indication; will start with 150 mg XR daily.  Checking with Dr. Corina today regarding possibility of neuropsych evaluation prior to discharge.  5. Neuropsych/cognition: This patient is capable of making decisions on his own behalf.  6. Skin/Wound Care: routine pressure relief measures -[place] J collar in place, monitor for changes in character of skin  -scalp laceration present on admission --stable appearance - 8-15: Complaints of itching under cervical collar. Continue Eucerin/hydrocortisone cream 1-1--symptomatic improvement 8-18  7. Fluids/Electrolytes/Nutrition: monitor I/O and repeat labs in a.m.              - Dysphagia 3 with nectar thick liquids--continue aspiration precautions. Continue SLP   - 8/8: Eating 50 to 100% of meals; on  Free water protocol for hydration.  Albumin 2.9, will encourage p.o. intakes, appreciate nutrition assistance.  8-11: Labs stable.  Albumin improved from 2.9-3.2.  Cleared for thin fluids today.  8/12: patient refused all meals 8/11; 100% breakfast this AM. Will monitor today - if continues to be poor will get nutritional consult  8-13: P.o. intakes picked up ,  okay to monitor for now  8-19: Reviewed vitamin D , low normal range.  Start Os-Cal with vitamin D  supplementation 1 tab twice daily for 6 weeks for fracture prevention   8. HTN: resumed home irbesartan --no BB due to bradycardia  - 8-8: Add as needed hydralazine  10 milligrams every 6 hours for SBP greater than 160, DBP greater than 100.  Increased blood pressure likely due to pain, monitor today may increase irbesartan  if renal function improves tomorrow.  - BP a little labile, continue to monitor trend and continue current medications  8/11: HTN with therapies, bradycardia - ?contribution of pain/headache vs. Anxiety. Self-resolves once calm.   8/13: HTN 140-160s; increase metolazone  to 5 mg daily--improved  - Blood pressure in a stable range, 130s to 150s.    12/11/2023    4:40 AM 12/11/2023    3:20 AM 12/10/2023    8:04 PM  Vitals with BMI  Weight  171 lbs 8 oz   BMI  26.09   Systolic 121  160  Diastolic 58  75  Pulse 61  61    9.  Chronic diastolic CHF/chronic lower extremity edema: patient's wife requested metolazone  and not Demadex .              -Currently on metolazone  2.5 mg daily, BNP 261-- wife says this CAN NOT BE REMOVED   - Daily weights and volume assessments.  - 8-11: Volume assessments and symptoms without signs of volume overload; chest x-ray 8-9 with improved pulmonary edema.  Weights downtrending while albumin is improving.  Monitor.  8/12: Stable cardiomegaly, pleural effusions on CXR--no external s/s volume overload; trend weights. May need to increase metolazone .   8/13: Weights uptrending, BP elevated - increase metolazone  to 5 mg daily; already discussed as potential with patient's wife, informed nursing to notify family when they get  in.  Weight reviewed and is stable  8-18: Weight somewhat variable, continue to monitor.   8-19: Blood pressure coming down as he weans off steroids, will resume home dose of metolazone  2.5 mg on discharge   - 8/20: Single episode  systolic 160s today; otherwise, 120s over 50s.  Weights below Likely inaccurate (see discrepancy over 8/19)  Filed Weights   12/10/23 0510 12/10/23 0600 12/11/23 0320  Weight: 75.1 kg 76.6 kg 77.8 kg    10. HLD: continue home atorvastatin   11.  Mild anemia: Continue to monitor CBC recheck labs in a.m.  - 8-11 hemoglobin uptrending 11.4--12.0 on 8-14  8-18: Resolved, hemoglobin 13  12.  AKI: Creatinine 1.38 on admission baseline approximately 1.0--trend creatinine. Last Cr 1.52. Recheck tomorrow.  - 8-8: Creatinine 1.5, BUN slightly increased.  Plan for IV fluids very gentle normal saline at 50 cc/h for 10 hours.  Repeat BMP in AM.  Did discuss with wife that if AKI does not improve, may need to adjust scheduled Celebrex  to as needed.  -8/9 Cr a little better at 1.51, change celebrex  to PRN  -8/11 - BUN improving, Cr normalized   8-18: BUN back up to 49, creatinine stable.  13. Obesity             Body mass index is 30.3 kg/m.  14. Bradycardia/atrial fibrillation             -Pt was evaluated by cardiology, no indication for temporary pacing   - Regular rate  -8/9 HR stable currently, nursing reports occasional bradycardic episodes at night, pt was evaluated by cardiology for this during acute admission  8-11: Reported episode of bradycardia 30s to 50s during therapies today; self resolved, patient asymptomatic.  If recurs again, will get EKG.  8/12: 2x episode SOB and bradycardia 30-50s with therapy this AM; EKG showed ?a flutter with ventricular rate 40s. Re-consulted cardiology for symptomatic bradycardia, added PRN atropine  0.5 mg IV (for use with rapid response if needed); not candidate for pacemake with last evaluation 8/13: Per EP: If he has episodes of syncope related to bradycardia, pacemaker implant would be reasonable. As he does have a c-collar in place, we will hold off for now. He can follow-up with general cardiology.   15. Hx of aortic aneurysm/dissection -f/u  outpatient, BP control   -Blood pressure management as above, has as needed hydralazine   16. Aortic stenosis             -Repeat echo 6-12 months has been recommended  17. L knee pain             -L knee injection completed by ortho 8/1--no current complaints of pain  18.  Right shoulder pain--no complaints on exam 8-11  19.  Dysphagia/secretion management  - Mucinex  600 mg twice daily  - Robitussin DM as needed  - DC Claritin  10 mg daily  -Was placed on Unasyn  --> Augmentin  for suspected aspiration, chest x-ray 8-9 improved without opacity; finish antibiotics at 10 days total  -8-8: Patient managing secretions well on current exam, no current rhonchi or wheezing, no concerns for aspiration.  Monitor oxygen and respiratory status closely.  Can add scopolamine patch if needed, but would hold off due to multiple anticholinergic medications unless absolutely needed.  -8/9  Restart claritin -has allergies hx, discussed increased use of suction  -8/10 nasal congestion no better--Flonase  BID started 8/8  8-11: MBS today with pill dysphagia, otherwise cleared for thin liquids.  Possible prevertebral edema noted at  C2, discussed with Dr. Georgina with orthopedics, will start Decadron  4 mg twice daily x 5 days with possible taper to see if this improves dysphagia  8/12: POs better today; monitor as above  8-14: Continues to improve; wife concerned regarding ongoing secretions and dysarthria, wants to discuss increasing steroid regimen with neurosurgery but does not want to involve Dr. Lanis  - Dr. Gillie called back to provide input; would not increase steroid regimen, no additional interventions offered.  Appreciate his expertise.   - 8-15: Finishing up Decadron  4 mg twice daily; will provide gradual taper of 2 mg twice daily for 2 days and 1 mg twice daily for 1 day prior to discharge  20. Occasional SOB and Anxiety, ?vertigo/fear of falling   -Suspect most likely anxiety but secretions could be  contributing. Will check CXR-negative for acute changes, Consider prn anxiety medication    -start xanax  0.25 PRN, discussed with his wife  - 94/10 patient's wife would like to have option for 0.125 dose of Xanax  as needed.  Order adjusted   -Schedule for neuropscyh- next available --discussed patient case with Dr. Corina 8-11  - 8/12: Recurrent, associated with bradycardia and HTN; See #4, #14 above. Also with bed positional changes. May benefit from Buspar trial, family wishes to defer at this time. Offered atarax as alternative to xanax , family defers at this time. Discussed limitations of audiology consult for vertigo; offered COR testing however patient has already seen ENT for OP evaluation for this.   8-13: No intervention per EP, still occurring primarily with head going back; resolves quickly with use of incentive spirometer  --See #4 above.  8-18: Seems to be improving--discharge with temporary prescription for Xanax , Wellbutrin  as above  LOS: 13 days A FACE TO FACE EVALUATION WAS PERFORMED  Zachary Underwood 12/11/2023, 8:21 AM

## 2023-12-11 NOTE — Progress Notes (Signed)
 Inpatient Rehabilitation Discharge Medication Review by a Pharmacist  A complete drug regimen review was completed for this patient to identify any potential clinically significant medication issues.  High Risk Drug Classes Is patient taking? Indication by Medication  Antipsychotic No   Anticoagulant Yes Xarelto  - CVA, Afib  Antibiotic No   Opioid Yes Oxycodone  prn pain  Antiplatelet No   Hypoglycemics/insulin No   Vasoactive Medication Yes Irbesartan  - HTN Metolazone  - fluid Tamsulosin  - BPH  Chemotherapy No   Other Yes Atorvastatin  - HLD Celecoxib , Lidocaine  patch- pain Xanax /Buprorion - anxiety Osccal D/ferrous sulfate/MVI - supplementation Triamcinolone  cream - rash Miralax /senokot - constipation Benadryl /flonase  - allergy Protonoix - GERD     Type of Medication Issue Identified Description of Issue Recommendation(s)  Drug Interaction(s) (clinically significant)     Duplicate Therapy     Allergy     No Medication Administration End Date     Incorrect Dose     Additional Drug Therapy Needed     Significant med changes from prior encounter (inform family/care partners about these prior to discharge).    Other       Clinically significant medication issues were identified that warrant physician communication and completion of prescribed/recommended actions by midnight of the next day:  No  Name of provider notified for urgent issues identified:   Provider Method of Notification:     Pharmacist comments: None  Time spent performing this drug regimen review (minutes):  20 minutes  Sergio Batch, PharmD, [place], AAHIVP, CPP Infectious Disease Pharmacist 12/11/2023 8:52 AM

## 2023-12-11 NOTE — Progress Notes (Signed)
 Discharge supplies given to patient's nurse.

## 2023-12-11 NOTE — Telephone Encounter (Signed)
 Pharmacy Patient Advocate Encounter   Received notification from Inpatient Request that prior authorization for Lidocaine  5% patches  is required/requested.   Insurance verification completed.   The patient is insured through [facility] .   Per test claim: PA required; PA submitted to above mentioned insurance via Latent Key/confirmation #/EOC [id number] Status is pending

## 2023-12-13 ENCOUNTER — Telehealth: Payer: Self-pay

## 2023-12-13 ENCOUNTER — Telehealth: Payer: Self-pay | Admitting: Physical Medicine and Rehabilitation

## 2023-12-13 NOTE — Telephone Encounter (Addendum)
 Transitional Care Call--who you spoke with Dr. Ulis (Wife), Junella (Daughter) & Patient   Are you/is patient experiencing any problems since coming home? No.  Are there any questions regarding any aspect of care? Yes. (Questions routed to Dr. Emeline on 12/13/2023). Are there any questions regarding medications administration/dosing? No. Are meds being taken as prescribed? Yes.  Patient should review meds with caller to confirm. Done. Have there been any falls? No. Has Home Health been to the house and/or have they contacted you? Yes.  If not, have you tried to contact them? No Can we help you contact them? Wife would like a Home Health Nurse. (Dr. Emeline has been informed). Are bowels and bladder emptying properly? Yes.  Are there any unexpected incontinence issues?No  If applicable, is patient following bowel/bladder programs?N/A  Any fevers, problems with breathing, unexpected pain? No. Are there any skin problems or new areas of breakdown? No. Has the patient/family member arranged specialty MD follow up (ie cardiology/neurology/renal/surgical/etc)?Follow up appointments reviewed.   Can we help arrange?  No. Does the patient need any other services or support that we can help arrange? No. Are caregivers following through as expected in assisting the patient? Yes.        11. Has the patient quit smoking, drinking alcohol, or using drugs as recommended? Advise given.   Appointment  [address] with Dr. Emeline 8/28//2025 at 2:40 PM. (Appointment will need to be changed).

## 2023-12-13 NOTE — Telephone Encounter (Signed)
 Please call Mr. Corsino wife or daughter.    Family has several questions for Dr. Emeline:  A. Mr. Dirosa needs a referral to see Dr. Kerrin ASAP. B.  He was given steroids in the hospital what is the plan at home? He was not given a steroid dose pack to taper off the steroid. If any Rx is needed please seen ASAP.  C. He needs a prescription for Vit D & Protonix  sent to the pharmacy.  D. He still has a bandage on his forehead/face. The family is not comfortable taking it off and caring for the wound. Please put in a referral for a Home Health Care Nurse, ASAP.  E.  He has Ensure at home. Dose he need any other brand? F.  With home health PT patient Blood pressure was 80/56 after hydration it was better. Today reading was 120/81. Dr. Ulis questioned if the  Avapro  could be an issue. He is also irritable and tried at times should the Robaxin  be adjusted?

## 2023-12-13 NOTE — Telephone Encounter (Signed)
 Hey dr emeline. Robbin came up here to me and said pts wife can't come at the time and needs a later appt or different day. Is it somewhere else we can put this patient ? Robbin said we needed to schedule them soon. Please advise

## 2023-12-17 NOTE — Telephone Encounter (Signed)
 they can't make the 2:40, they asked it be rescheduled to the later time. Thank you!

## 2023-12-18 IMAGING — MR MR HEAD W/O CM
12 of 13 series · 44 of 48 positions shown · non-contrast
Comparison: None Available.

CLINICAL DATA: Fall.  Altered mental status.

EXAM:
MRI HEAD WITHOUT CONTRAST
TECHNIQUE: Multiplanar, multiecho pulse sequences of the brain and surrounding
structures were obtained without intravenous contrast.

[Series 5: DWI · axial · 3.0mm · 0.88mm/px · z∈[-92,+72]mm · 8 of 112 slices shown (1 of 4)]
[im 1/112]
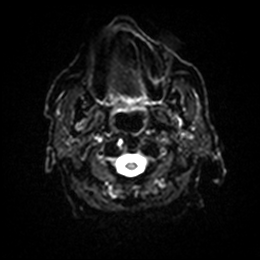
[im 16/112]
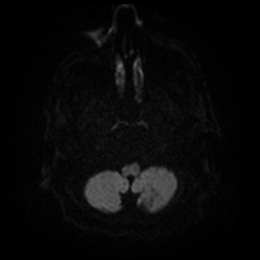
[im 32/112]
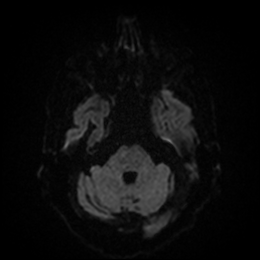
[im 48/112]
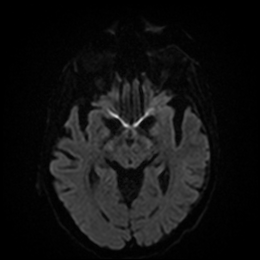
[im 64/112]
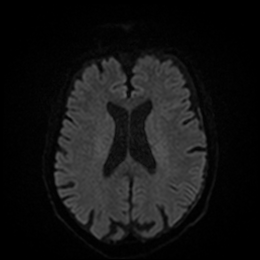
[im 80/112]
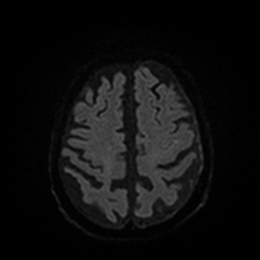
[im 96/112]
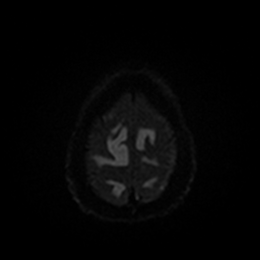
[im 112/112]
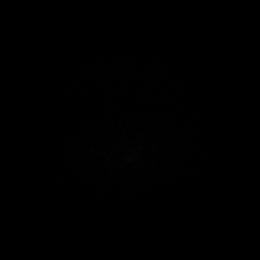

[Series 6: DWI · axial · 3.0mm · 0.88mm/px · z∈[-92,+72]mm · 4 of 56 slices shown (2 of 4)]
[im 1/56]
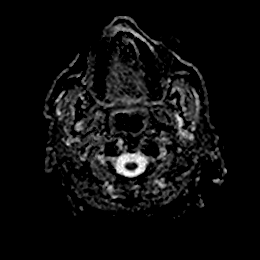
[im 19/56]
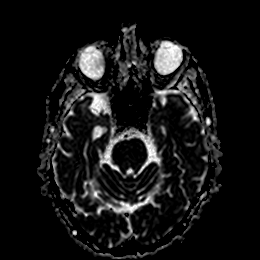
[im 37/56]
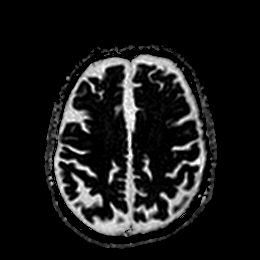
[im 56/56]
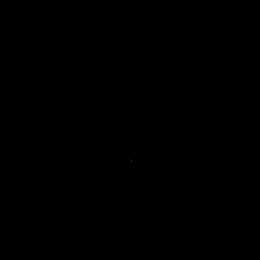

[Series 7: DWI · coronal · 4.0mm · 0.88mm/px · 5 of 72 slices shown (3 of 4)]
[im 1/72]
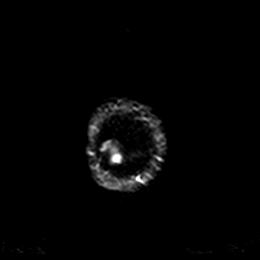
[im 18/72]
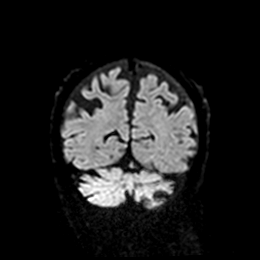
[im 36/72]
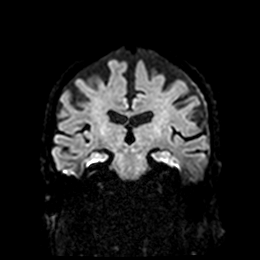
[im 54/72]
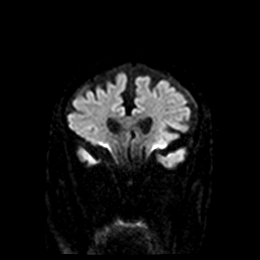
[im 72/72]
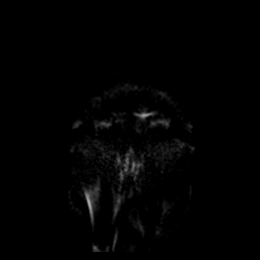

[Series 8: DWI · coronal · 4.0mm · 0.88mm/px · 3 of 36 slices shown (4 of 4)]
[im 1/36]
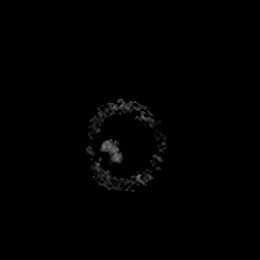
[im 18/36]
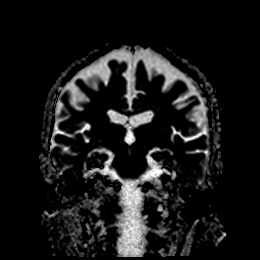
[im 36/36]
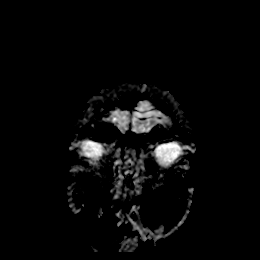

[Series 9: T1 · sagittal · 5.0mm · 0.75mm/px · 2 of 25 slices shown]
[im 1/25]
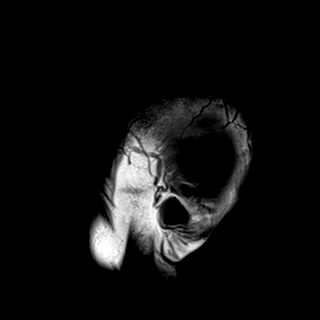
[im 25/25]
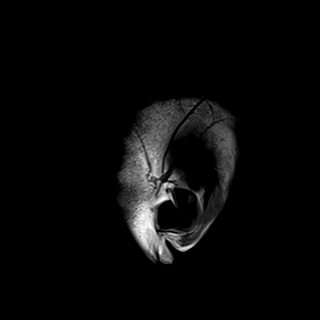

[Series 10: T2 · axial · 5.0mm · 0.72mm/px · z∈[-91,+71]mm · 2 of 28 slices shown (1 of 2)]
[im 1/28]
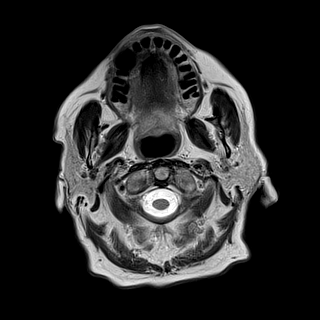
[im 28/28]
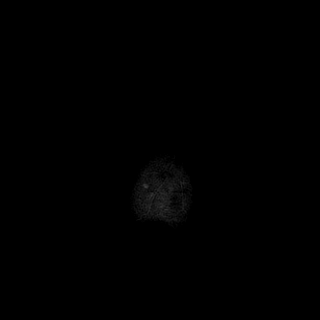

[Series 11: FLAIR · axial · 5.0mm · 0.45mm/px · z∈[-90,+72]mm · 2 of 28 slices shown]
[im 1/28]
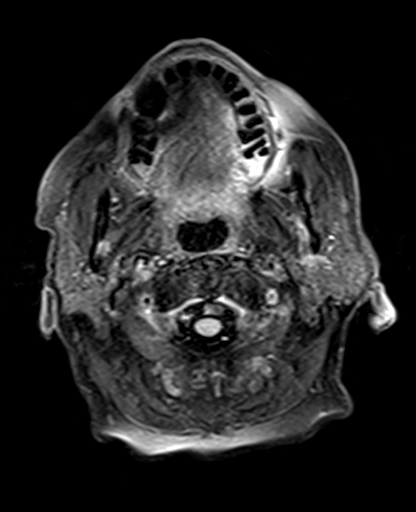
[im 28/28]
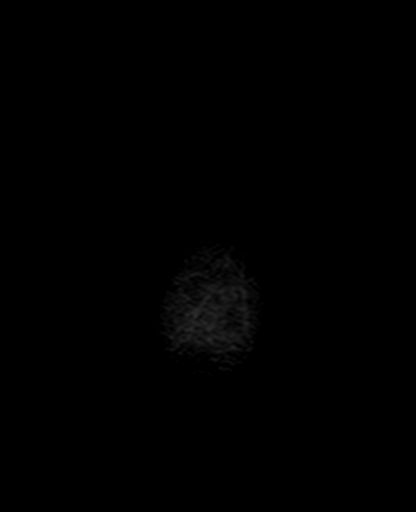

[Series 12: mag_images · axial · 3.0mm · 0.90mm/px · z∈[-91,+73]mm · 4 of 56 slices shown]
[im 1/56]
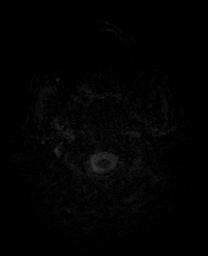
[im 19/56]
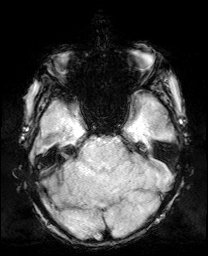
[im 37/56]
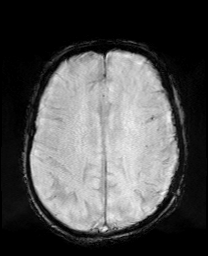
[im 56/56]
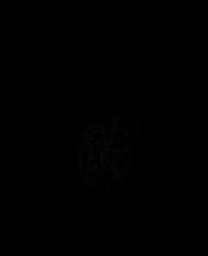

[Series 13: pha_images · axial · 3.0mm · 0.90mm/px · z∈[-88,+73]mm · 4 of 55 slices shown]
[im 1/55]
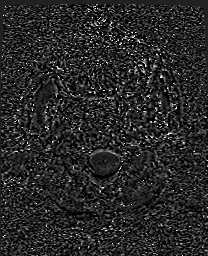
[im 19/55]
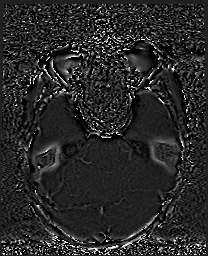
[im 37/55]
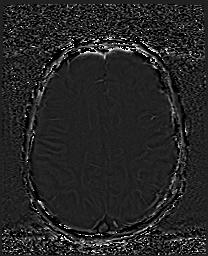
[im 55/55]
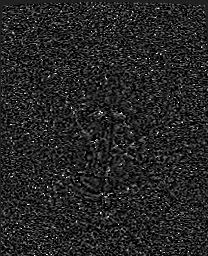

[Series 14: swi_images · axial · 3.0mm · 0.90mm/px · z∈[-91,+73]mm · 4 of 56 slices shown]
[im 1/56]
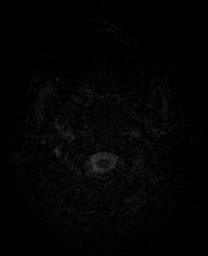
[im 19/56]
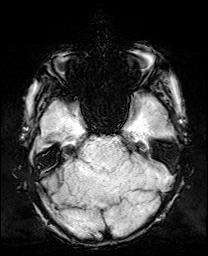
[im 37/56]
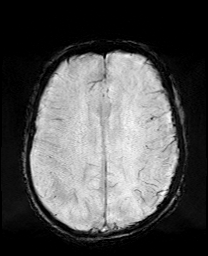
[im 56/56]
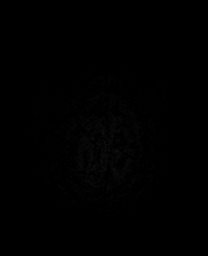

[Series 15: mip_images(sw) · axial · 24.0mm · 0.90mm/px · z∈[-81,+63]mm · 4 of 49 slices shown]
[im 1/49]
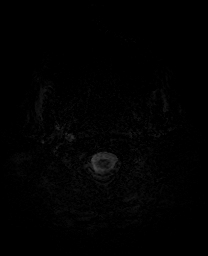
[im 17/49]
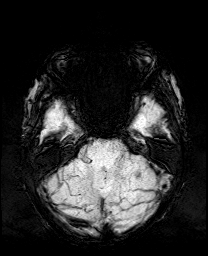
[im 33/49]
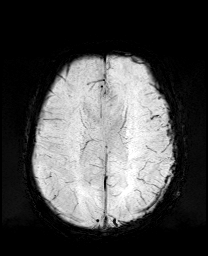
[im 49/49]
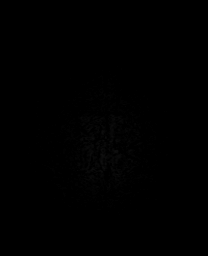

[Series 17: T2 · coronal · 5.0mm · 0.34mm/px · 2 of 31 slices shown (2 of 2)]
[im 1/31]
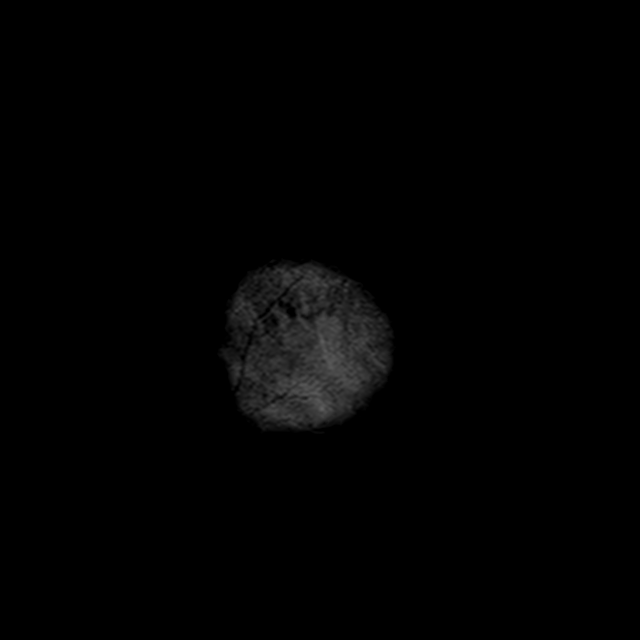
[im 31/31]
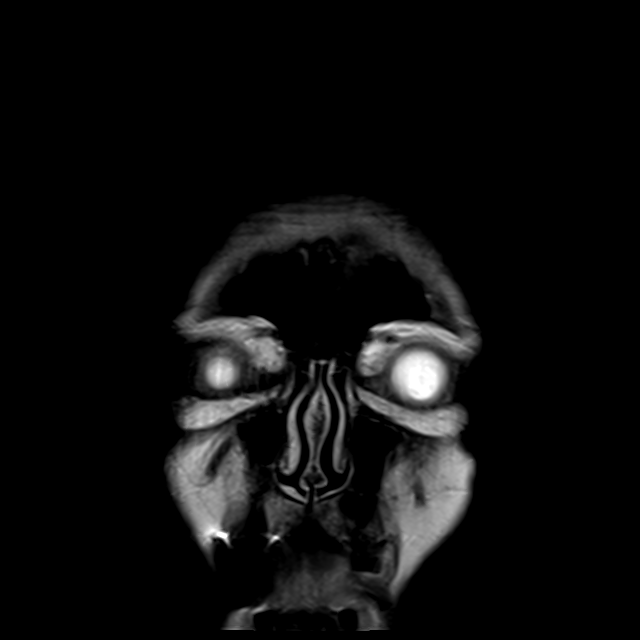

[44 of 48 positions shown; findings below may reference images not displayed]

FINDINGS: Brain: Multiple small foci of acute/early subacute ischemia within
the anterior left parietal lobe. There is an old left cerebellar
infarct. No acute or chronic hemorrhage. Normal white matter signal.
Generalized volume loss. The midline structures are normal.

Vascular: Major flow voids are preserved.

Skull and upper cervical spine: Normal calvarium and skull base.
Visualized upper cervical spine and soft tissues are normal.

Sinuses/Orbits:No paranasal sinus fluid levels or advanced mucosal
thickening. No mastoid or middle ear effusion. Normal orbits.
IMPRESSION: 1. Multiple small foci of acute/early subacute ischemia within the
anterior left parietal lobe. No hemorrhage or mass effect.
2. Old left cerebellar infarct.

## 2023-12-19 ENCOUNTER — Encounter: Attending: Physical Medicine and Rehabilitation | Admitting: Physical Medicine and Rehabilitation

## 2023-12-19 ENCOUNTER — Encounter: Admitting: Physical Medicine and Rehabilitation

## 2023-12-19 ENCOUNTER — Encounter: Payer: Self-pay | Admitting: Physical Medicine and Rehabilitation

## 2023-12-19 VITALS — BP 133/80 | HR 57 | Ht 68.0 in | Wt 170.0 lb

## 2023-12-19 DIAGNOSIS — M25519 Pain in unspecified shoulder: Secondary | ICD-10-CM | POA: Insufficient documentation

## 2023-12-19 DIAGNOSIS — G479 Sleep disorder, unspecified: Secondary | ICD-10-CM | POA: Diagnosis not present

## 2023-12-19 DIAGNOSIS — X58XXXS Exposure to other specified factors, sequela: Secondary | ICD-10-CM | POA: Insufficient documentation

## 2023-12-19 DIAGNOSIS — Z7409 Other reduced mobility: Secondary | ICD-10-CM | POA: Diagnosis not present

## 2023-12-19 DIAGNOSIS — F410 Panic disorder [episodic paroxysmal anxiety] without agoraphobia: Secondary | ICD-10-CM | POA: Insufficient documentation

## 2023-12-19 DIAGNOSIS — Z789 Other specified health status: Secondary | ICD-10-CM | POA: Insufficient documentation

## 2023-12-19 DIAGNOSIS — S12120S Other displaced dens fracture, sequela: Secondary | ICD-10-CM | POA: Insufficient documentation

## 2023-12-19 DIAGNOSIS — M542 Cervicalgia: Secondary | ICD-10-CM | POA: Insufficient documentation

## 2023-12-19 IMAGING — CT CT ANGIO HEAD-NECK (W OR W/O PERF)
2 of 7 series · 8 of 33 positions shown · IV contrast (OMNI 350)
Comparison: Brain MRI 08/22/2021

CLINICAL DATA: Recent fall.  Expressive aphasia.

EXAM:
CT ANGIOGRAPHY HEAD AND NECK
TECHNIQUE: Multidetector CT imaging of the head and neck was performed using
the standard protocol during bolus administration of intravenous
contrast. Multiplanar CT image reconstructions and MIPs were
obtained to evaluate the vascular anatomy. Carotid stenosis
measurements (when applicable) are obtained utilizing NASCET
criteria, using the distal internal carotid diameter as the
denominator.

[Series 5: cta neck · axial · 0.49mm/px · z∈[-301,-189]mm · 2 of 169 slices shown]
[im 57/169  soft-tissue]
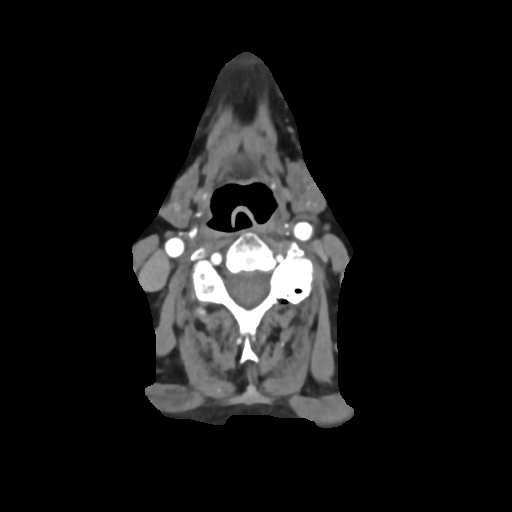
[im 113/169  soft-tissue]
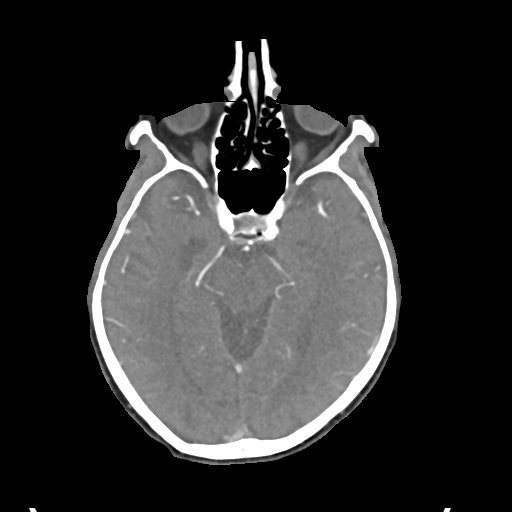

[Series 7: cta neck axial · axial · 0.39mm/px · z∈[-366,-126]mm · 6 of 329 slices shown]
[im 47/329  soft-tissue]
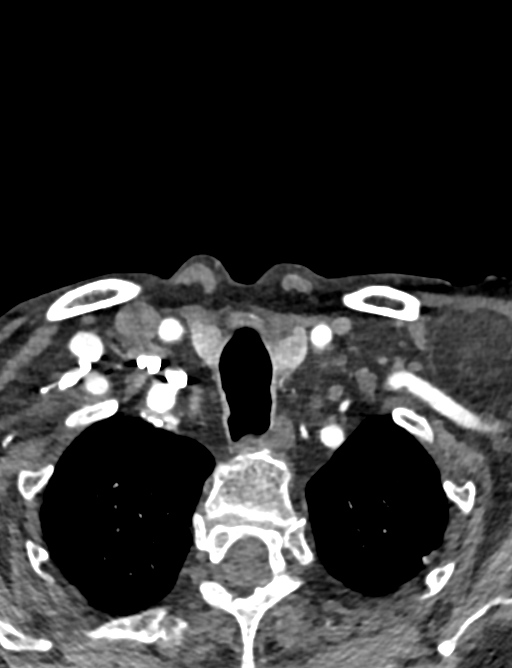
[im 94/329  bone]
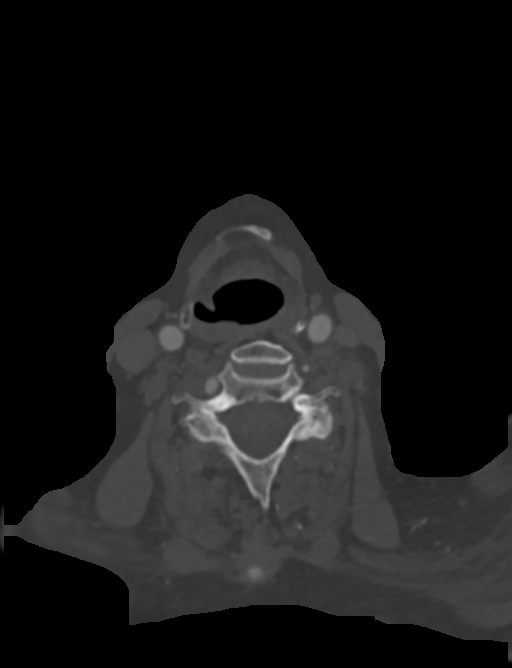
[im 141/329  soft-tissue]
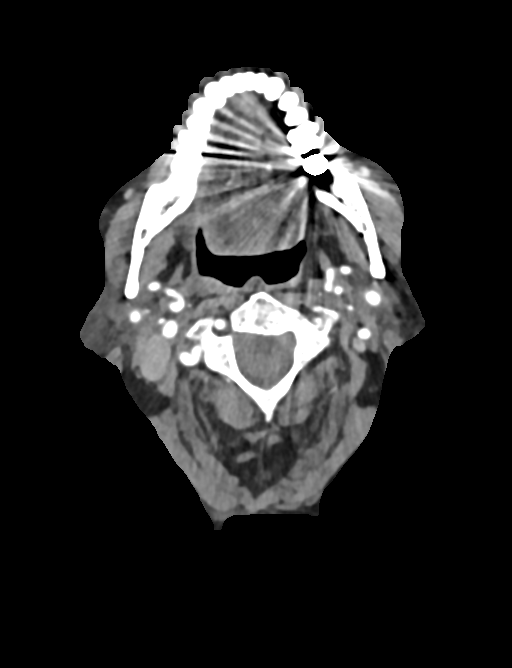
[im 188/329  bone]
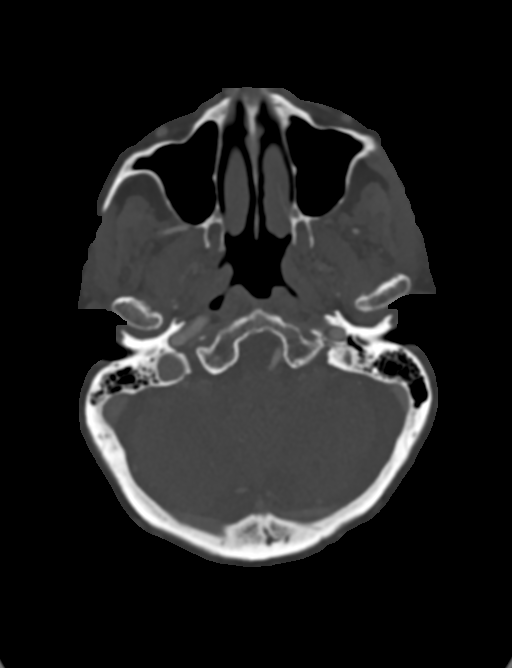
[im 235/329  soft-tissue]
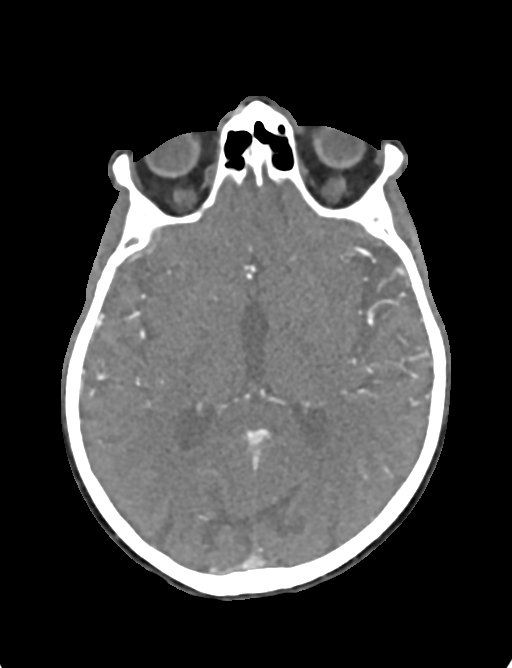
[im 282/329  bone]
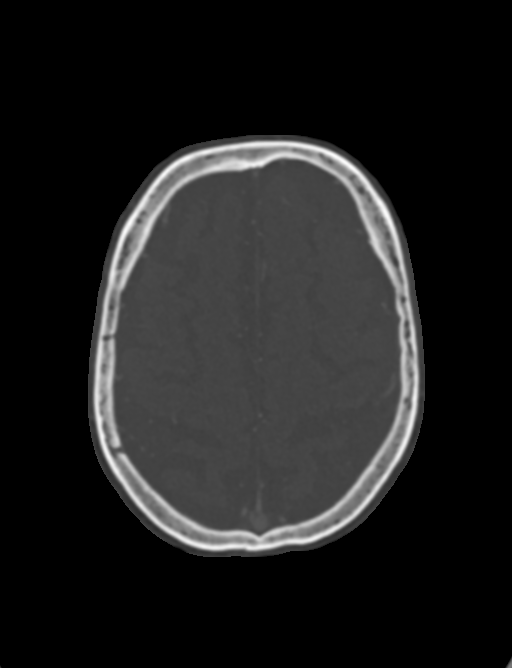

[8 of 33 positions shown; findings below may reference images not displayed]

RADIATION DOSE REDUCTION: This exam was performed according to the
departmental dose-optimization program which includes automated
exposure control, adjustment of the mA and/or kV according to
patient size and/or use of iterative reconstruction technique.

CONTRAST:  75mL OMNIPAQUE IOHEXOL 350 MG/ML SOLN
FINDINGS: CTA NECK FINDINGS

SKELETON: There is no bony spinal canal stenosis. No lytic or
blastic lesion.

OTHER NECK: Normal pharynx, larynx and major salivary glands. No
cervical lymphadenopathy. Unremarkable thyroid gland.

UPPER CHEST: No pneumothorax or pleural effusion. No nodules or
masses.

AORTIC ARCH:

There is calcific atherosclerosis of the aortic arch. Ascending
thoracic aorta measures 3.9 cm. Conventional 3 vessel aortic
branching pattern. The visualized proximal subclavian arteries are
widely patent.

RIGHT CAROTID SYSTEM: No dissection, occlusion or aneurysm. There is
calcified atherosclerosis extending into the proximal ICA, resulting
in 50% stenosis.

LEFT CAROTID SYSTEM: No dissection, occlusion or aneurysm. There is
calcified atherosclerosis extending into the proximal ICA, resulting
in 65% stenosis.

VERTEBRAL ARTERIES: Right dominant configuration. Both origins are
clearly patent. There is no dissection, occlusion or flow-limiting
stenosis to the skull base (V1-V3 segments).

CTA HEAD FINDINGS

POSTERIOR CIRCULATION:

--Vertebral arteries: Normal V4 segments.

--Inferior cerebellar arteries: Normal.

--Basilar artery: Normal.

--Superior cerebellar arteries: Normal.

--Posterior cerebral arteries (PCA): Normal.

ANTERIOR CIRCULATION:

--Intracranial internal carotid arteries: Atherosclerotic
calcification of the internal carotid arteries at the skull base
without hemodynamically significant stenosis.

--Anterior cerebral arteries (ACA): Normal. Both A1 segments are
present. Patent anterior communicating artery (a-comm).

--Middle cerebral arteries (MCA): Normal.

VENOUS SINUSES: As permitted by contrast timing, patent.

ANATOMIC VARIANTS: None

Review of the MIP images confirms the above findings.
IMPRESSION: 1. No intracranial arterial occlusion or high-grade stenosis.
2. Bilateral carotid bifurcation atherosclerosis resulting in 50-65%
stenosis of the proximal internal carotid arteries bilaterally.

Aortic Atherosclerosis ([id number]-IN0.0).

## 2023-12-19 MED ORDER — LIDOCAINE 5 % EX PTCH: 1.0000 | MEDICATED_PATCH | Freq: Every day | CUTANEOUS | Status: DC | PRN

## 2023-12-19 MED ORDER — METHOCARBAMOL 500 MG PO TABS: ORAL_TABLET | ORAL | Status: AC

## 2023-12-19 MED ORDER — LORATADINE 10 MG PO TABS: 10.0000 mg | ORAL_TABLET | Freq: Every day | ORAL | Status: AC | PRN

## 2023-12-19 NOTE — Patient Instructions (Signed)
 Call Dr. Barnet office for a follow up appointment    Zachary Aures, MD.   Specialty: Orthopedic Surgery Why: fracture follow up Contact information: [address] [place] [address] [phone]   Stop celebrex  and protonix  Continue iron supplement every other day and vitamin D  supplement daily Continue claritin  as needed only for rhinitis or increased secretions Stop wellbutrin  and xanax   Follow up with me (in person or vis telehealth) in 1 month to discuss independence  Follow up with Dr. Corina / Dr. Hayden once for ongoing anxiety regarding your fall  Continue lidocaine  patches as needed for pain   For Sleep: Pick the same time to lay down every night, ideally between 8 and 10 PM.    Starting 1 hour before you want to go to sleep, turn off all television screens, phone screens, tablets, and computers.    Keep the lights low and perform only low stimulation activities, such as reading.    Only use your bedroom for sleep and sex. Avoid daytime naps and limit any time spent in your bed that is not dedicated to sleep.   You may also take 5 to 10 mg of over-the-counter melatonin approximately 1 hour before bedtime, or if you are prescribed a medication for sleep take it at this time.  Avoid alcohol, decongestants/pseudoephedrine, caffeine, and nicotine a few hours before bedtime, as these can reduce your quality of sleep.

## 2023-12-19 NOTE — Progress Notes (Unsigned)
 Subjective:    Patient ID: GEARALD STONEBRAKER, male    DOB: [birth year], 89 y.o.   MRN: [id number]  HPI  JERIMAH WITUCKI is a 89 y.o. year old male  who  has a past medical history of Arthritis, Cardiac arrhythmia, Hypertension, Ruptured aortic aneurysm (HCC), and Stroke (HCC) (08/23/2021).   They are presenting to PM&R clinic for follow up related to IPR stay s/p C2 odontoid fracture, managed with cervical collar .  Plan from last visit:  A. Mr. Goodin needs a referral to see Dr. Kerrin ASAP.   Daphne my NP sent a referral to his office when he discharged, and the office manager has confirmed this was received. I have no control over when Mr. Neisen is scheduled for this appointment - Dr. Corina manages his own schedule.    B.  He was given steroids in the hospital what is the plan at home? He was not given a steroid dose pack to taper off the steroid. If any Rx is needed please seen ASAP.    None needed - his steroid taper was completed in the hospital.   C. He needs a prescription for Vit D & Protonix  sent to the pharmacy.    The vitamin D  with calcium  supplement was sent to [facility] pharmacy when he left, but the pharmacy may have not filled it because these are over the counter. He should be getting enough dietary calcium , but a 800 to 1000 I.U. vitamin D  supplement daily for 6 weeks should be sufficient.    Daphne has sent the protonix  in to his pharmacy. He should stay on this medication as long as he is on celebrex .    D. He still has a bandage on his forehead/face. The family is not comfortable taking it off and caring for the wound. Please put in a referral for a Home Health Care Nurse, ASAP.    Home health is not required to care for his laceration; you can remove the bandage and leave it open to air. It is ok to get it wet in the shower, but do not scrub it. I will look at it in clinic next week.    E.  He has Ensure at home. Dose he need any other brand?   No, ensure  supplements are fine.   F.  With home health PT patient Blood pressure was 80/56 after hydration it was better. Today reading was 120/81. Dr. Ulis questioned if the  Avapro  could be an issue. He is also irritable and tried at times should the Robaxin  be adjusted?   I would stop the Wellbutrin  first if he is irritable and tired.    We also talked about reducing his iron supplement to once every other day due to GI upset.    Interval Hx:  - Therapies: SLP and PT have started; Devere with OT is coming. Progressing well. He is compliant with HEP program daily.   - Follow ups: Has not yet had a follow up made with Dr. Burnetta; His PCP has a follow up via telehealth tomorrow.   - Falls: none  - DME: Using his walker in and out of the home; will sometimes go without it in the home. [facility]  - narrower walker so it fits into the bathroom and closet. Currently trnasfering between 2 walkers into those rooms.   - Medications:  He is no longer taking the celebrex  - he has not been taking anything  for pain. Iron every other day - he says bowel movements are now better and he has no incontinence.  Statin at AM keeps him up less than statin in PM Wellbutrin  caused a bad reaction He is not taking xanax  at home Robaxin  is causing some lethargy so he is taking 1/4 tablet before bed. He is not using lidoderm  patches but has them at home He is not using stool softeners regularly; wife says he is straining He is taking a vitamin D  supplement daily   - Other concerns:   Very little pain in his neck anymore, just in the back, intermittent  Vocalizations are improving  Somewhere in the afternoon he gets very sleepy and cranky; he is napping a few times in the afternoon which has been helpful. He used to snore at nighttime but that is improving. He is currently sleeping on a [employer] that is very narrow; his bed is very high so he has difficulty getting in/out.   Having issues  sleeping on his back with the collar  He continues to have episodes of shortness of breath, but they are decreasing in frequency and severity. He breathes through them and smiles. Wife has not noticed any anxiety attacks since Sunday.   Pain Inventory Average Pain 3 Pain Right Now 1 My pain is dull and aching  In the last 24 hours, has pain interfered with the following? General activity N/A Relation with others N/A Enjoyment of life N/A What TIME of day is your pain at its worst? varies Sleep (in general) Fair  Pain is worse with: bending Pain improves with: rest Relief from Meds: 4  walk with assistance how many minutes can you walk? varies ability to climb steps?  no do you drive?  no  employed # of hrs/week still working,m hours vary  trouble walking  Any changes since last visit?  yes  Primary care Daphne Finders, NP    No family history on file. Social History   Socioeconomic History   Marital status: Married    Spouse name: [name] Hageman   Number of children: 4   Years of education: Not on file   Highest education level: Professional school degree (e.g., MD, DDS, DVM, JD)  Occupational History   Occupation: economist  Tobacco Use   Smoking status: Former    Types: Cigarettes   Smokeless tobacco: Never   Tobacco comments:    Former smoker 03/29/23  Vaping Use   Vaping status: Never Used  Substance and Sexual Activity   Alcohol use: Never   Drug use: Never   Sexual activity: Not Currently  Other Topics Concern   Not on file  Social History Narrative   Lives with wife   Job Publishing copy    Social Drivers of Health   Financial Resource Strain: Low Risk  (09/04/2021)   Overall Financial Resource Strain (CARDIA)    Difficulty of Paying Living Expenses: Not hard at all  Food Insecurity: No Food Insecurity (11/23/2023)   Hunger Vital Sign    Worried About Running Out of Food in the Last Year: Never true    Ran Out of Food in the Last Year: Never true   Transportation Needs: No Transportation Needs (11/23/2023)   PRAPARE - Administrator, Civil Service (Medical): No    Lack of Transportation (Non-Medical): No  Physical Activity: Not on file  Stress: No Stress Concern Present (09/04/2021)   [employer] of Occupational Health - Occupational Stress Questionnaire  Feeling of Stress : Only a little  Social Connections: Unknown (11/23/2023)   Social Connection and Isolation Panel    Frequency of Communication with Friends and Family: Twice a week    Frequency of Social Gatherings with Friends and Family: Patient declined    Attends Religious Services: Patient declined    Database administrator or Organizations: Yes    Attends Banker Meetings: 1 to 4 times per year    Marital Status: Married   Past Surgical History:  Procedure Laterality Date   open heart surgery     ruptured aortic aneurysm repair     Past Medical History:  Diagnosis Date   Arthritis    Cardiac arrhythmia    Hypertension    Ruptured aortic aneurysm (HCC)    Stroke (HCC) 08/23/2021   BP 133/80 (BP Location: Left Arm, Patient Position: Sitting, Cuff Size: Large)   Pulse (!) 57   Ht 5' 8 (1.727 m)   Wt 170 lb (77.1 kg)   SpO2 97%   BMI 25.85 kg/m   Opioid Risk Score:   Fall Risk Score:  `1  Depression screen [facility] 2/9     09/04/2021   11:11 AM  Depression screen PHQ 2/9  Decreased Interest 0  Down, Depressed, Hopeless 0  PHQ - 2 Score 0      Review of Systems  Musculoskeletal:  Positive for neck pain and neck stiffness.  All other systems reviewed and are negative.      Objective:   Physical Exam   Shuffling gait with RW     Assessment & Plan:

## 2023-12-20 DIAGNOSIS — G479 Sleep disorder, unspecified: Secondary | ICD-10-CM | POA: Insufficient documentation

## 2023-12-20 DIAGNOSIS — M542 Cervicalgia: Secondary | ICD-10-CM | POA: Insufficient documentation

## 2023-12-20 DIAGNOSIS — Z7409 Other reduced mobility: Secondary | ICD-10-CM | POA: Insufficient documentation

## 2023-12-20 DIAGNOSIS — F410 Panic disorder [episodic paroxysmal anxiety] without agoraphobia: Secondary | ICD-10-CM | POA: Insufficient documentation

## 2024-01-14 ENCOUNTER — Ambulatory Visit: Admitting: Podiatry

## 2024-01-20 ENCOUNTER — Encounter: Attending: Physical Medicine and Rehabilitation | Admitting: Physical Medicine and Rehabilitation

## 2024-01-20 VITALS — BP 138/83 | HR 69 | Ht 68.0 in | Wt 179.0 lb

## 2024-01-20 DIAGNOSIS — S1091XS Abrasion of unspecified part of neck, sequela: Secondary | ICD-10-CM | POA: Diagnosis not present

## 2024-01-20 DIAGNOSIS — S12120S Other displaced dens fracture, sequela: Secondary | ICD-10-CM | POA: Diagnosis present

## 2024-01-20 DIAGNOSIS — R55 Syncope and collapse: Secondary | ICD-10-CM | POA: Diagnosis not present

## 2024-01-20 DIAGNOSIS — R42 Dizziness and giddiness: Secondary | ICD-10-CM | POA: Diagnosis not present

## 2024-01-20 DIAGNOSIS — Z789 Other specified health status: Secondary | ICD-10-CM | POA: Insufficient documentation

## 2024-01-20 DIAGNOSIS — Z7409 Other reduced mobility: Secondary | ICD-10-CM | POA: Insufficient documentation

## 2024-01-20 DIAGNOSIS — X58XXXS Exposure to other specified factors, sequela: Secondary | ICD-10-CM | POA: Insufficient documentation

## 2024-01-20 DIAGNOSIS — S1091XA Abrasion of unspecified part of neck, initial encounter: Secondary | ICD-10-CM | POA: Insufficient documentation

## 2024-01-20 MED ORDER — MEPILEX BORDER FLEX EX PADS: 1.0000 | MEDICATED_PAD | Freq: Every day | CUTANEOUS | 12 refills | Status: AC

## 2024-01-20 NOTE — Patient Instructions (Addendum)
 Reach out to your cardiologist Dr. Vickey or Dr. Wilton regarding recent dizziness with standing and moving; your blood pressure is in a better place but your heart rate continues to be on the low end, and it would be worth getting an evaluation by a cardiologist that knows you.  Continue home PT, OT, and SLP. Today we discussed proper nutrition and weakness in hour hip flexors effecting your gait. Continue to work on exercises to build muscle and improve your hip strength to improve your foot clearance when you walk.   I suspect your fatigue is from concussion  following your fall and should improve with time. I will not prescribe further meds given extreme reaction to melatonin.  Follow up with orthopedics for clearing your collar.   Use mepilex dressings to prevent skin breakdown in your neck  Follow up as needed

## 2024-01-20 NOTE — Progress Notes (Signed)
 Subjective:    Patient ID: Zachary Underwood, male    DOB: 15-Dec-1927, 89 y.o.   MRN: [id number]  HPI   Zachary Underwood is a 89 y.o. year old male  who  has a past medical history of Arthritis, Cardiac arrhythmia, Hypertension, Ruptured aortic aneurysm (HCC), and Stroke (HCC) (08/23/2021).    They are presenting to PM&R clinic for follow up related to IPR stay s/p C2 odontoid fracture, managed with cervical collar .     Interval Hx:  - Therapies: Initially felt great coming back from rehab; after starting melatonin he started to get weaker, had nightmares and started falling asleep during the day. He still feels he sleeps poorly and feels very weak; is without pain or nightmares.   Has been getting home PT, OT, and SLP. He feels he is doing OK; walking a lot and working a little on upper body.   He feels speech and swallowing were worse with melatonin but have helped considerably coming off of those. He did a speech and it went well. He was able to think clearly and articulate well through it.     - Follow ups: ER for dizziness 9/25 -   Patient workup here is reassuring. CT of the head is negative. He is slightly prerenal did receive a liter of IV fluids and his blood pressure is much improved. Now 128/61 and he feels significantly better. I feel his symptoms were related to vasovagal near syncope exacerbated by hydration status. He is also had challenges with sleep recently which are also exacerbating it. He is no longer currently on the melatonin but still has not adjusted his sleep appropriately. Regards I do feel that he is safe for outpatient follow-up discussed at length with him and his wife my suspicion for TIA is low. We discussed following up with primary care any issues concerns not hesitate to return to ER patient stable   Saw [facility] 9/26: Zachary Underwood presented today because of concerns by his wife of oversedation/somnolence. He reports that after taking melatonin that he became  hypersomnolent. It was concerned and so he went to the emergency room twice had CT scans which ruled out any intracranial pathology. On exam he has no new focal neurological deficits. His neck pain is mild. At this point I have indicated to the patient and his wife that if there is any issues with his sleep but that would have to be addressed by his primary care physician as it is beyond the scope of my expertise. Nothing further that is required since his symptoms seem to have abated. He will follow-up me as scheduled on 02/11/2024 for x-rays.    - Falls: none; had syncope pretty badly with melatonin but that is better. He does feel unsteady at times.     - IFZ:Dupoo using the walker in his home   - Medications: Removed melatonin; shifting Xarelto  to dinner; took off claritin . Metolazone  still on 5 mg;  and now on irbesartan  as needed. BP today is 138/83.    - Other concerns: Caregiver said his left neck brace is curring into his neck.   Pain Inventory Average Pain 0 Pain Right Now 0 My pain is numbness, stiffness  In the last 24 hours, has pain interfered with the following? General activity 0 Relation with others 0 Enjoyment of life 0 What TIME of day is your pain at its worst? varies Sleep (in general) Poor  Pain is worse with: . Pain improves with: .  Relief from Meds: .  No family history on file. Social History   Socioeconomic History   Marital status: Married    Spouse name: [name] Hageman   Number of children: 4   Years of education: Not on file   Highest education level: Professional school degree (e.g., MD, DDS, DVM, JD)  Occupational History   Occupation: economist  Tobacco Use   Smoking status: Former    Types: Cigarettes   Smokeless tobacco: Never   Tobacco comments:    Former smoker 03/29/23  Vaping Use   Vaping status: Never Used  Substance and Sexual Activity   Alcohol use: Never   Drug use: Never   Sexual activity: Not Currently  Other Topics  Concern   Not on file  Social History Narrative   Lives with wife   Job Publishing copy    Social Drivers of Health   Financial Resource Strain: Low Risk  (09/04/2021)   Overall Financial Resource Strain (CARDIA)    Difficulty of Paying Living Expenses: Not hard at all  Food Insecurity: No Food Insecurity (11/23/2023)   Hunger Vital Sign    Worried About Running Out of Food in the Last Year: Never true    Ran Out of Food in the Last Year: Never true  Transportation Needs: No Transportation Needs (11/23/2023)   PRAPARE - Administrator, Civil Service (Medical): No    Lack of Transportation (Non-Medical): No  Physical Activity: Not on file  Stress: No Stress Concern Present (09/04/2021)   [employer] of Occupational Health - Occupational Stress Questionnaire    Feeling of Stress : Only a little  Social Connections: Unknown (11/23/2023)   Social Connection and Isolation Panel    Frequency of Communication with Friends and Family: Twice a week    Frequency of Social Gatherings with Friends and Family: Patient declined    Attends Religious Services: Patient declined    Database administrator or Organizations: Yes    Attends Banker Meetings: 1 to 4 times per year    Marital Status: Married   Past Surgical History:  Procedure Laterality Date   open heart surgery     ruptured aortic aneurysm repair     Past Surgical History:  Procedure Laterality Date   open heart surgery     ruptured aortic aneurysm repair     Past Medical History:  Diagnosis Date   Arthritis    Cardiac arrhythmia    Hypertension    Ruptured aortic aneurysm (HCC)    Stroke (HCC) 08/23/2021   BP 138/83   Pulse 69   Ht 5' 8 (1.727 m)   Wt 179 lb (81.2 kg)   SpO2 97%   BMI 27.22 kg/m   Opioid Risk Score:   Fall Risk Score:  `1  Depression screen [facility] 2/9     09/04/2021   11:11 AM  Depression screen PHQ 2/9  Decreased Interest 0  Down, Depressed, Hopeless 0  PHQ - 2 Score 0     Review of Systems  Neurological:  Positive for weakness and numbness.       Stiffness  All other systems reviewed and are negative.      Objective:   Physical Exam  PE: Constitution: Appropriate appearance for age. No apparent distress  HEENT: Forehead abrasion healing. + C collar. No apparent deformity of neck. Resp: No respiratory distress. No accessory muscle usage. On RA. Clear vocal quality, no apparent secretions.  Cardio: Well perfused appearance. 1+ b/l  peripheral edema. Abdomen: Nondistended.   Psych: Appropriate mood and affect. Skin: Small area of erythema on L neck at collar contact, blanchable, no apparent DTI.    Neurologic Exam:   DTRs: Reflexes were 2+ in bilateral achilles, patella, biceps, BR and triceps. Babinsky: flexor responses b/l.   Hoffmans: negative b/l Sensory exam: revealed normal sensation in all dermatomal regions in bilateral upper extremities and bilateral lower extremities Motor exam: strength 5/5 throughout bilateral upper extremities and bilateral lower extremities Coordination: Fine motor coordination was normal.   Gait: with walker; shuffling, occassional forefoot catching bilaterrally. Hips remain flexed, slight forward bend, responds to cueing for facing forward. No apparent unilateral weakness or ataxia.         Assessment & Plan:   Zachary Underwood is a 89 y.o. year old male  who  has a past medical history of Arthritis, Cardiac arrhythmia, Hypertension, Ruptured aortic aneurysm (HCC), and Stroke (HCC) (08/23/2021).   They are presenting to PM&R clinic for follow up related to IPR stay s/p C2 odontoid fracture, managed with cervical collar .   Other closed displaced odontoid fracture, sequela  I suspect your fatigue is from concussion  following your fall and should improve with time. I will not prescribe further meds given extreme reaction to melatonin.  Follow up with orthopedics for clearing your collar.   Follow up as  needed  Impaired mobility and ADLs  Continue home PT, OT, and SLP. Today we discussed proper nutrition and weakness in hour hip flexors effecting your gait. Continue to work on exercises to build muscle and improve your hip strength to improve your foot clearance when you walk.   Postural dizziness with presyncope Reach out to your cardiologist Dr. Vickey or Dr. Wilton regarding recent dizziness with standing and moving; your blood pressure is in a better place but your heart rate continues to be on the low end, and it would be worth getting an evaluation by a cardiologist that knows you.  Abrasion of neck, sequela Use mepilex dressings to prevent skin breakdown in your neck with C collar  Other orders -     Mepilex Border Flex; Apply 1 each topically daily. Apply to neck under collar to reduce abrasions  Dispense: 5 each; Refill: 12

## 2024-01-23 ENCOUNTER — Ambulatory Visit: Admitting: Podiatry

## 2024-01-29 ENCOUNTER — Other Ambulatory Visit: Payer: Self-pay | Admitting: Nurse Practitioner

## 2024-01-29 ENCOUNTER — Emergency Department (HOSPITAL_COMMUNITY)
Admission: EM | Admit: 2024-01-29 | Discharge: 2024-01-29 | Disposition: A | Discharge status: Home / Self Care | Attending: Emergency Medicine | Admitting: Emergency Medicine

## 2024-01-29 ENCOUNTER — Emergency Department (HOSPITAL_COMMUNITY)

## 2024-01-29 ENCOUNTER — Encounter (HOSPITAL_COMMUNITY): Payer: Self-pay | Admitting: Emergency Medicine

## 2024-01-29 ENCOUNTER — Other Ambulatory Visit: Payer: Self-pay

## 2024-01-29 DIAGNOSIS — Z7901 Long term (current) use of anticoagulants: Secondary | ICD-10-CM | POA: Diagnosis not present

## 2024-01-29 DIAGNOSIS — I4891 Unspecified atrial fibrillation: Secondary | ICD-10-CM | POA: Diagnosis not present

## 2024-01-29 DIAGNOSIS — W19XXXA Unspecified fall, initial encounter: Secondary | ICD-10-CM

## 2024-01-29 DIAGNOSIS — W07XXXA Fall from chair, initial encounter: Secondary | ICD-10-CM | POA: Insufficient documentation

## 2024-01-29 DIAGNOSIS — R799 Abnormal finding of blood chemistry, unspecified: Secondary | ICD-10-CM | POA: Diagnosis not present

## 2024-01-29 DIAGNOSIS — T148XXA Other injury of unspecified body region, initial encounter: Secondary | ICD-10-CM

## 2024-01-29 DIAGNOSIS — S0003XA Contusion of scalp, initial encounter: Secondary | ICD-10-CM | POA: Insufficient documentation

## 2024-01-29 DIAGNOSIS — M629 Disorder of muscle, unspecified: Secondary | ICD-10-CM

## 2024-01-29 DIAGNOSIS — S0990XA Unspecified injury of head, initial encounter: Secondary | ICD-10-CM | POA: Diagnosis present

## 2024-01-29 LAB — COMPREHENSIVE METABOLIC PANEL WITH GFR
ALT: 14 U/L (ref 0–44)
AST: 26 U/L (ref 15–41)
Albumin: 3 g/dL — ABNORMAL LOW (ref 3.5–5.0)
Alkaline Phosphatase: 96 U/L (ref 38–126)
Anion gap: 15 (ref 5–15)
BUN: 30 mg/dL — ABNORMAL HIGH (ref 8–23)
CO2: 21 mmol/L — ABNORMAL LOW (ref 22–32)
Calcium: 9.3 mg/dL (ref 8.9–10.3)
Chloride: 103 mmol/L (ref 98–111)
Creatinine, Ser: 1.4 mg/dL — ABNORMAL HIGH (ref 0.61–1.24)
GFR, Estimated: 47 mL/min — ABNORMAL LOW (ref >60)
Glucose, Bld: 91 mg/dL (ref 70–99)
Potassium: 3.6 mmol/L (ref 3.5–5.1)
Sodium: 139 mmol/L (ref 135–145)
Total Bilirubin: 0.7 mg/dL (ref 0.0–1.2)
Total Protein: 5.8 g/dL — ABNORMAL LOW (ref 6.5–8.1)

## 2024-01-29 LAB — CBC WITH DIFFERENTIAL/PLATELET
Abs Immature Granulocytes: 0.04 K/uL (ref 0.00–0.07)
Basophils Absolute: 0.1 K/uL (ref 0.0–0.1)
Basophils Relative: 1 %
Eosinophils Absolute: 0.3 K/uL (ref 0.0–0.5)
Eosinophils Relative: 6 %
HCT: 32.5 % — ABNORMAL LOW (ref 39.0–52.0)
Hemoglobin: 10.3 g/dL — ABNORMAL LOW (ref 13.0–17.0)
Immature Granulocytes: 1 %
Lymphocytes Relative: 19 %
Lymphs Abs: 1 K/uL (ref 0.7–4.0)
MCH: 29.4 pg (ref 26.0–34.0)
MCHC: 31.7 g/dL (ref 30.0–36.0)
MCV: 92.9 fL (ref 80.0–100.0)
Monocytes Absolute: 0.5 K/uL (ref 0.1–1.0)
Monocytes Relative: 9 %
Neutro Abs: 3.3 K/uL (ref 1.7–7.7)
Neutrophils Relative %: 64 %
Platelets: 192 K/uL (ref 150–400)
RBC: 3.5 MIL/uL — ABNORMAL LOW (ref 4.22–5.81)
RDW: 15.9 % — ABNORMAL HIGH (ref 11.5–15.5)
WBC: 5.1 K/uL (ref 4.0–10.5)
nRBC: 0 % (ref 0.0–0.2)

## 2024-01-29 LAB — CBG MONITORING, ED: Glucose-Capillary: 87 mg/dL (ref 70–99)

## 2024-01-29 LAB — TROPONIN I (HIGH SENSITIVITY): Troponin I (High Sensitivity): 10 ng/L (ref <18)

## 2024-01-29 NOTE — ED Provider Notes (Signed)
 [place] EMERGENCY DEPARTMENT AT [facility] Provider Note   CSN: [id number] Arrival date & time: 01/29/24  9480     Patient presents with: No chief complaint on file.   Zachary Underwood is a 89 y.o. male.   89 year old male presents to the ER today secondary to a fall.  Unwitnessed.  Unsure of loss of consciousness.  Has a known C2 fracture already in a c-collar.  Has a hematoma and abrasion to the back of his head with bleeding.  Is on Xarelto  for atrial fibrillation.  No pain elsewhere.        Prior to Admission medications   Medication Sig Start Date End Date Taking? Authorizing Provider  atorvastatin  (LIPITOR) 10 MG tablet Take 5 mg by mouth at bedtime. 06/07/23   [provider]  calcium -vitamin D  (OSCAL WITH D) 500-5 MG-MCG tablet Take 1 tablet by mouth 2 (two) times daily. 12/10/23   Jerilynn Daphne CROME, NP  feeding supplement (ENSURE PLUS HIGH PROTEIN) LIQD Take 237 mLs by mouth 2 (two) times daily between meals. 11/28/23   Vann, Jessica U, DO  ferrous sulfate 325 (65 FE) MG EC tablet Take 325 mg by mouth every other day.    [provider]  irbesartan  (AVAPRO ) 75 MG tablet Take 0.5 tablets (37.5 mg total) by mouth daily. 12/11/23   Jerilynn Daphne CROME, NP  lidocaine  (LIDODERM ) 5 % Place 1 patch onto the skin daily as needed (neck and shoulder pain). Remove & Discard patch within 12 hours or as directed by MD 12/19/23   Emeline Joesph BROCKS, DO  loratadine  (CLARITIN ) 10 MG tablet Take 1 tablet (10 mg total) by mouth daily as needed for allergies or rhinitis. 12/19/23   Emeline Joesph BROCKS, DO  methocarbamol  (ROBAXIN ) 500 MG tablet 1/4 tab as needed for muscle aches / spasms 12/19/23   Emeline Joesph C, DO  metolazone  (ZAROXOLYN ) 2.5 MG tablet Take 1 tablet (2.5 mg total) by mouth daily. 11/06/23   Court Dorn PARAS, MD  Multiple Vitamins-Minerals (MULTIVITAMIN WITH MINERALS) tablet Take 0.5 tablets by mouth daily.    [provider]  Rivaroxaban  (XARELTO )  15 MG TABS tablet Take 1 tablet (15 mg total) by mouth daily with supper. 12/10/23   Jerilynn Daphne CROME, NP  sodium chloride  (OCEAN) 0.65 % SOLN nasal spray Place 1 spray into both nostrils as needed for congestion. 11/28/23   Vann, Jessica U, DO  tamsulosin  (FLOMAX ) 0.4 MG CAPS capsule Take 0.4 mg by mouth at bedtime.    [provider]  triamcinolone  cream (KENALOG ) 0.1 % Apply 1 Application topically 2 (two) times daily as needed. Mix with Eucerin cream as directed. 12/11/23   Jerilynn Daphne CROME, NP  Wound Dressings (MEPILEX BORDER FLEX) PADS Apply 1 each topically daily. Apply to neck under collar to reduce abrasions 01/20/24   Emeline Joesph C, DO    Allergies: Short ragweed pollen ext, Beef-derived drug products, Eliquis  [apixaban ], Lasix [furosemide], Other, and Tylenol [acetaminophen]    Review of Systems  Updated Vital Signs There were no vitals taken for this visit.  Physical Exam Vitals and nursing note reviewed.  Constitutional:      Appearance: He is well-developed.  HENT:     Head: Normocephalic.     Comments: Scar and scab anterior scalp, hematoma and abrasion posterior scalp, no C-spine tenderness. Cardiovascular:     Rate and Rhythm: Normal rate.  Pulmonary:     Effort: Pulmonary effort is normal. No respiratory distress.  Abdominal:  General: There is no distension.  Musculoskeletal:        General: Normal range of motion.     Cervical back: Normal range of motion.     Comments: No cervical spine tenderness, thoracic spine tenderness or Lumbar spine tenderness.  No tenderness or pain with palpation and full ROM of all joints in upper and lower extremities.  No ecchymosis or other signs of trauma on back or extremities.  No Pain with AP or lateral compression of ribs.  No Paracervical ttp, paraspinal ttp   Neurological:     Mental Status: He is alert.     (all labs ordered are listed, but only abnormal results are displayed) Labs Reviewed  CBC WITH  DIFFERENTIAL/PLATELET  COMPREHENSIVE METABOLIC PANEL WITH GFR  CBG MONITORING, ED  TROPONIN I (HIGH SENSITIVITY)    EKG: None  Radiology: No results found.   Procedures   Medications Ordered in the ED - No data to display                                  Medical Decision Making Amount and/or Complexity of Data Reviewed Labs: ordered. Radiology: ordered. ECG/medicine tests: ordered.   Labs for unwitnessed fall. Ct head/neck, cxr for unwitnessed fall. Wound care per nursing.   Imaging reassuring on my interpretation no head bleed or obvious new fractures but has persistent cervical fractures.  Patient stable for discharge.  At time of discharge patient's wife asked me why he was more sleepy than normal.  She wondered if he had compression on his spine.  She was sure to say that he was neurologically intact but he would just get more sleepy and wonder if he had swelling.  She wondered if MRI would be necessary.  I stated that we could do an MRI if she Apsley wanted to but it would not explain sleepiness.  He had symmetric hand squeeze and dorsiflexion/plantarflexion.  He had good sensation to light touch in his distal extremities.  Low suspicion for paralysis or any other ill effects of any possible ligament swelling or instability and his CT scan was stable from a couple months ago so did not feel like an MRI was necessary.  She states she has Celebrex  1 time for few days and he had instant improvement which she thought was empiric evidence that he had swelling somewhere.  I explained the best that I could that I did not think his neck fracture had anything to do with his fatigue rather could be medication, metabolic, infectious, dehydration, hormonal nature.  She states that all those test have been checked by his PCP.  I encouraged further PCP follow-up for this.     Final diagnoses:  None    ED Discharge Orders     None          Matty Deamer, Selinda, MD 01/29/24 [phone]

## 2024-01-29 NOTE — ED Notes (Addendum)
 Trauma Response Nurse Documentation   Zachary Underwood is a 89 y.o. male arriving to [facility] ED via EMS  On Xarelto  (rivaroxaban ) daily. Trauma was activated as a Level 2 by ED charge RN based on the following trauma criteria Elderly patients > 65 with head trauma on anti-coagulation (excluding ASA).  Patient cleared for CT by Dr. Lorette EDP. Pt transported to CT with trauma response nurse present to monitor. RN remained with the patient throughout their absence from the department for clinical observation.   GCS 15.  History   Past Medical History:  Diagnosis Date   Arthritis    Cardiac arrhythmia    Hypertension    Ruptured aortic aneurysm [facility])    Stroke (HCC) 08/23/2021     Past Surgical History:  Procedure Laterality Date   open heart surgery     ruptured aortic aneurysm repair         Initial Focused Assessment (If applicable, or please see trauma documentation): Alert/oriented male presents via EMS from home after a fall with posterior head injury. Denies LOC. Hematoma and abrasion to posterior head with bleeding controlled. Hx C2 fx earlier this year, arrives in home [place].  Airway patent, BS clear Bleeding from head wound controlled GCS 15 PERRLA 2  CT's Completed:   CT Head and CT C-Spine   Interventions:  IV start and trauma lab draw Portable chest XRAY TDAP deferred - updated 11/22/23 CT head and c-spine  Plan for disposition:  Discharge  Consults completed:  none  Event Summary: Presents via EMS from home after a mechanical fall, no LOC. Hematoma and corresponding abrasion to posterior head, bleeding controlled.Hx C2 fx recently, arrives in home [place] collar. Escorted to CT following portable XRAYS. Trauma scans unremarkable. Anticipate D/C home.   Bedside handoff with ED RN John.    Elke Holtry O Glennon Kopko  Trauma Response RN  Please call TRN at [phone] for further assistance.

## 2024-01-29 NOTE — ED Triage Notes (Signed)
 BIB [facility] from home. Pt was in his chair and fell to floor. Pt was found down by his wife with a hematoma and lac to back of head. EMS reports that pt is on Xarelto  for A-fib. Pt is also already in c-collar [facility] J) r/t C2 Fx from a fall approx 2 months ago.   BP 166/90 HR 58 BG 105 Spo2 98%

## 2024-01-29 NOTE — Progress Notes (Signed)
 Orthopedic Tech Progress Note Patient Details:  Zachary Underwood [birth year] [id number]  Patient ID: [name], male   DOB: 05-25-1927, 89 y.o.   MRN: [id number] Checked in for level 2 trauma.  Morna Pink 01/29/2024, 5:58 AM

## 2024-01-31 ENCOUNTER — Other Ambulatory Visit: Payer: Self-pay | Admitting: Nurse Practitioner

## 2024-01-31 DIAGNOSIS — R222 Localized swelling, mass and lump, trunk: Secondary | ICD-10-CM

## 2024-02-07 ENCOUNTER — Other Ambulatory Visit: Payer: Self-pay | Admitting: Nurse Practitioner

## 2024-02-07 DIAGNOSIS — Z8673 Personal history of transient ischemic attack (TIA), and cerebral infarction without residual deficits: Secondary | ICD-10-CM

## 2024-02-14 ENCOUNTER — Other Ambulatory Visit: Payer: Self-pay | Admitting: Nurse Practitioner

## 2024-02-14 DIAGNOSIS — M629 Disorder of muscle, unspecified: Secondary | ICD-10-CM

## 2024-02-17 ENCOUNTER — Encounter: Attending: Physical Medicine and Rehabilitation | Admitting: Psychology

## 2024-02-17 DIAGNOSIS — F4322 Adjustment disorder with anxiety: Secondary | ICD-10-CM | POA: Insufficient documentation

## 2024-02-17 NOTE — Progress Notes (Signed)
 NEUROPSYCHOLOGICAL EVALUATION [place]. [facility]  Physical Medicine and Rehabilitation     Patient: Zachary Underwood  MRN: [id number] DOB: 08-Feb-1928  Age: 89 y.o. Sex: male  Race/Ethnicity: White or Caucasian  Handedness: Right  Referring Provider: Emeline Joesph BROCKS, DO  Provider/Clinical Neuropsychologist: Evalene DOROTHA Riff, PsyD  Date of Service: 02/17/24 Start Time: 3:00 PM End Time: 5:50 PM  Location of Service:  [facility] Physical Medicine & Rehabilitation Department [place] [address] [place] [place], [place] [address] Phone: [phone]  Billing Code/Service:            [id number]  IIndividuals Present: Patient was seen unaccompanied, in-person, by the provider. 120 minutes spent in face-to-face clinical interview, collaborative treatment planning, psychotherapeutic interventions, and remaining 50 minutes was spent in record review and documentation.    PATIENT CONSENT AND CONFIDENTIALITY The patient's understanding of the reason for referral was intact. Discussed limits of confidentiality including, but not limited to, posting of final evaluation report in the patient's electronic medical record for both the patient and for the referring provider and appropriate medical professionals. Patient was given the opportunity to have their questions answered. The neuropsychological evaluation process was discussed with the patient and they consented to proceed with the evaluation.  Consent for Evaluation and Treatment: Signed: Yes Explanation of Privacy Policies: Signed: Yes Discussion of Confidentiality Limits: Yes   REASON FOR REFERRAL & RECORD REVIEW: The patient was referred for psychotherapy by his physiatrist, Dr.Engler. Per PM&R records dated 12/19/23 state: past medical history of Arthritis, Cardiac arrhythmia, Hypertension, Ruptured aortic aneurysm (HCC), and Stroke (HCC) (08/23/2021).   They are presenting to PM&R clinic  for follow up related to IPR stay s/p C2 odontoid fracture, managed with cervical collar. Records indicate that the patient is struggling with panic attacks. Referral was placed for treatment of anxiety related to his fall. He had been prescribed Wellbutrin  and Xanax , which were discontinued. Notes report difficulty with sleep. The patient is reportedly involved in SLP and PT, with OT planned for near future.   BACKGROUND & HISTORY OF PRESENTING CONCERNS: Per patient report, Patient moved to [facility] in 2023 with his wife, [name]. Fall and subsequent injuries in 2023, including stroke. Unclear if CVA was causal or consequence of fall. The patient reported making a full recovery and doing well until around 2025. He had multiple falls this year starting in March. Knee injury from the first fall healed well after 2.5 months. He had another fall August 1st which involved head impact (no acute findings on CT). He spent 3 weeks in the hospital, including rehabilitation. He had another fall per records in early October. He indicated he suffered a spinal fracture.   The patient presented with concerns involving anxiety / panic related symptoms and difficulties related to task prioritization. Based on the patient's descriptions, he appeared interested in understanding the nature of his anxiety related difficulties and how to manage them. The patient described struggling with acute anxiety/panic symptoms daily, occurring between 1-5 times and without clear trigger. He is able to recognize the episodes as panic attacks. He utilizes breathing exercises which he finds helpful. He denied any indications of PTSD symptomology or clear indications of Panic Disorder. His endorsements did not indicate generalized anxiety disorder, although he does have some difficulties relaxing and feeling tense. He endorsed difficulties with sleep that are significant, sleeping only 4-5 hours per night on bad nights intermittently during  the week and 10-12 on the other nights.  No history of OSA. No frequent nightmares, but some vivid dreams. No frank depression. Denied difficulties with frequent feelings of low mood outside of understandable frustration related to circumstances and functional changes related to health. Denied any anhedonia. No current or past SI, HI, hallucination, paranoia, mania/hypomania, or other clear psychiatric disorders. No prior treatment history. Appetite is intact. One cup of coffee per day. No alcohol/tobacco/recreational substance use.    Subjective cognitive changes: Per patient report, Memory: Denied noticing significant difficulties with short term memory. No difficulties remembering recent conversations, events, appointments, med patients. Processing Speed: Denied noticing changes in his thinking speed.    Attention & Concentration: Denied noticing significant attention and concentration difficulties. Language: Gradual declines in word finding, possibly more so in the last year.  Describes some indications of problems with receptive speech/understanding others in conversations although within the context of hearing loss and possible contributions from variable alertness.     Visual-Spatial: No deficits reported.   Executive Functioning: The patient noted his wife described changes in the patient's abilities to prioritize tasks.  Patient denied any clear indications of impairments in basic judgment, ability to organize his thoughts/write a formal report. He reports some increased irritability/reduced frustration tolerance, but not frank impulsivity or marked behavioral changes.    Motor/Sensory Complaints: Ambulates with support of walker. Is working with PT/OT/SLP.  Vision is reportedly good. He has hearing loss which has reportedly worsened. He wears hearing aids and they have been adjusted regularly as needed. Some difficulties with mobility and motor functioning accounted for by medical history.     Level of Functional Independence: The patient is intact with basic and instrumental activities of daily living from a cognitive perspective.   Medical History/Record Review: Per records and patient report, History of traumatic brain injury/concussion: No remote history/prior to 2023   History of stroke: None prior to 2023   History of heart attack: None.  Had ruptured aorta. Treated successfully.   History of cancer/chemotherapy:No Hx chemotherapy. Hx prostate cancer. History of seizure activity: None   Symptoms of chronic pain: Minimal   Experience of frequent headaches/migraines: No  Past Medical History:  Diagnosis Date   Arthritis    Cardiac arrhythmia    Hypertension    Ruptured aortic aneurysm (HCC)    Stroke (HCC) 08/23/2021   Patient Active Problem List   Diagnosis Date Noted   Postural dizziness with presyncope 01/20/2024   Abrasion of neck 01/20/2024   Impaired mobility and ADLs 12/20/2023   Panic attacks 12/20/2023   Cervicalgia 12/20/2023   Sleeping difficulty 12/20/2023   Cervical spine fracture (HCC) 11/28/2023   Left knee pain 11/28/2023   Dysphagia 11/28/2023   Anemia 11/28/2023   Chronic diastolic congestive heart failure (HCC) 11/28/2023   Bradycardia 11/23/2023   Chronic atrial fibrillation (HCC) 11/23/2023   Chronic diastolic CHF (congestive heart failure) (HCC) 11/23/2023   Coronary artery calcification seen on CAT scan 11/23/2023   Ascending aortic aneurysm 11/23/2023   Closed displaced fracture of second cervical vertebra (HCC) 11/22/2023   History of CVA (cerebrovascular accident) 11/22/2023   AKI (acute kidney injury) 11/22/2023   Normocytic anemia 11/22/2023   Lower extremity edema 11/06/2023   TIA (transient ischemic attack) 07/24/2023   Syncope and collapse 01/28/2023   Benign enlargement of prostate 07/30/2022   Bilateral hearing loss 07/30/2022   Other abnormalities of gait and mobility 07/30/2022   Other specified problems related to  psychosocial circumstances 07/30/2022   Sensorineural hearing loss, bilateral 07/30/2022   Tinnitus, bilateral  07/30/2022   Impingement syndrome of left shoulder region 06/03/2022   Mild cognitive impairment of uncertain or unknown etiology 03/27/2022   Snores 03/27/2022   Encounter for examination for normal comparison and control in clinical research program 03/27/2022   Hyperlipidemia 02/28/2022   Primary hypertension 02/28/2022   Nonrheumatic aortic valve stenosis 02/28/2022   Stroke (HCC) 08/23/2021   Atherosclerosis of aorta 08/23/2021   Persistent atrial fibrillation (HCC)    Imaging/Lab Results:  11/26/23 MR BRAIN WO CONTRAST COMPARISON: 07/24/2023 brain MRI FINDINGS: BRAIN AND VENTRICLES: Old left cerebellar infarct. No acute infarct. No intracranial hemorrhage. No mass. No midline shift. No hydrocephalus. The sella is unremarkable. Normal flow voids. Minimal nonspecific white matter T2-weighted signal hyperintensities, which may be associated with early chronic small vessel disease or migraine headaches. Normal volume loss. IMPRESSION: 1. No acute intracranial abnormality. 2. Old left cerebellar infarct. 3. Minimal nonspecific white matter T2-weighted signal hyperintensities, possibly related to chronic small vessel disease 4. Known C2 fracture  08/22/21 MRI HEAD WITHOUT CONTRAST  COMPARISON: 08/22/2021  FINDINGS: Brain: Multiple small foci of acute/early subacute ischemia within the anterior left parietal lobe. There is an old left cerebellar infarct. No acute or chronic hemorrhage. Normal white matter signal. Generalized volume loss. The midline structures are normal. IMPRESSION: 1. Multiple small foci of acute/early subacute ischemia within the anterior left parietal lobe. No hemorrhage or mass effect. 2. Old left cerebellar infarct.  Medications: Per records, atorvastatin  (LIPITOR) 10 MG tablet calcium -vitamin D  (OSCAL WITH D) 500-5 MG-MCG tablet feeding  supplement (ENSURE PLUS HIGH PROTEIN) LIQD ferrous sulfate 325 (65 FE) MG EC tablet irbesartan  (AVAPRO ) 75 MG tablet lidocaine  (LIDODERM ) 5 % loratadine  (CLARITIN ) 10 MG tablet methocarbamol  (ROBAXIN ) 500 MG tablet metolazone  (ZAROXOLYN ) 2.5 MG tablet Multiple Vitamins-Minerals (MULTIVITAMIN WITH MINERALS) tablet Rivaroxaban  (XARELTO ) 15 MG TABS tablet sodium chloride  (OCEAN) 0.65 % SOLN nasal spray tamsulosin  (FLOMAX ) 0.4 MG CAPS capsule triamcinolone  cream (KENALOG ) 0.1 % Wound Dressings (MEPILEX BORDER FLEX) PADS  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 Not working this semester.   Psychosocial: Marital Status: Widowed from prior marriage. Married to his current wife for 26 years.  Children/Grandchildren: 3 adult daughters and 1 son. 5 grandchildren.  Living Situation: Lives with spouse.  Daily Activities/Hobbies: Teaching, giving talks at [employer] and groups around the community. Writing on subjects of interst to him. He reads a good deal. Spends time doing things around the house.   Mental Status/Behavioral Observations: The patient was seen on an outpatient basis in the [facility] PM&R office for the clinical interview accompanied by a caregiver. He was interviewed alone.  Sensorium/Arousal: Variable alertness. The patient had difficulty remaining awake and would intermittently appear to fall asleep unintentionally and for brief durations. He would sometimes provide basic verbal responses but not clearly be registering the information presented. He acknowledged that he had poor sleep the night prior. He has hearing difficulties and acknowledges utilizing lip reading in addition to hearing aids.  Orientation: Full Appearance: Appropriate dress and hygiene.  Behavior: Cooperative, personable.  Speech/Language: Conversational speech was prosodic, fluent, and well-articulated. Receptive speech appeared to be impacted only be hearing loss and difficulty  with alertness.  Motor: Ambulated slow but steady with support of walker. No tremor was observed.  Social Comportment: Appropriate for the setting. Mood: Euthymic. Affect: Congruent. Thought Process/Content: Coherent, linear, and goal directed with no indications of thought disorder.  Ability to Participate in Interview: Readily answered all questions posed during the clinical interview. The variable alertness did impact  his ability to remain engaged, but this was certainly not volitional.  Insight: Quite excellent, although seemingly not fully aware of alertness variability.    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 The patient appears quite strong, premorbidly, and at present, with respect to intellectual abilities. In terms of his desire for use of psychological services, the patient initially was interested in a single consultation, as best as I can gather. The provider explained that the typical process would involve recurring sessions, but the provider adjusted based on patient interests but in a way that is still clinically appropriate and likely beneficial. When discussing this, the patient indicated that he wanted to meet possibly one month from the current visit. The provider adjusted the current session, shifting away from primary focus on comprehensive clinical interview and instead conducted a more abbreviated interview and added psychoeducation, conceptualization, recommendations, and guidance for interventions to utilize independently between session and materials for doing so. The patient was amenable to shit.   Conceptually, symptoms of anxiety and panic appear to be linked to his multiple recent falls and anxiety likely has a bidirectional relationship with sleep difficulties. The patient demonstrated excellent insight overall. The clinical research associate provided  psychoeducation and behavioral recommendations regarding anxiety/panic, emphasizing the role avoidance behavior can play in maintaining and exacerbating these difficulties. Caveats, however, involved recognizing the importance of physical safety and following medical provider recommendations with respect to use walker/cane for safe ambulation. The patient endorsed some tendencies / styles in his cognitions related to the fall which may be exacerbating anxiety related difficulties. The patient was provided psychoeducation regarding cognitive restructuring/CBT and materials for his use between now and the next session. Psychoeducation and recommendations were provided verbally and via handouts relating to sleep hygiene. Connections between sleep quality, cognition, and mood were reviewed.   The patient's cognitive complaints involving prioritization of tasks is not clearly compelling for executive dysfunction given absence of other EF related difficulties and strong suspicion for interference in attention and concentration in execution of tasks. This was discussed with the patient. Similarly, the patient's reported difficulties with verbal comprehension appear related to variable alertness. This became apparent thought in-session evaluation / behavioral observations. This may be related to sleep difficulties and general fatigue as he continues in physical recovery. Cognitive difficulties related to head injury cannot be ruled out completely based on available data however, although his presentation (aside from alertness) was reassuring in session. The patient was offered the option of undergoing cognitive assessment, but he declined at this time.   DISPOSITION / PLAN The patient is scheduled to return for at least one more visit for psychotherapy in approximately one month. Need for future sessions will be determined based on assessment at that time.   Diagnosis: Adjustment disorder with anxiety  [F43.22]  Evalene DOROTHA Riff, PsyD Clinical Neuropsychology [address] [place], [place] [address] Main: [phone] Fax: [phone]  This report was generated using voice recognition software. While this document has been carefully reviewed, transcription errors may be present. I apologize in advance for any inconvenience. Please contact me if further clarification is needed.

## 2024-02-18 ENCOUNTER — Ambulatory Visit (INDEPENDENT_AMBULATORY_CARE_PROVIDER_SITE_OTHER): Admitting: Podiatry

## 2024-02-18 ENCOUNTER — Encounter: Payer: Self-pay | Admitting: Podiatry

## 2024-02-18 ENCOUNTER — Encounter: Payer: Self-pay | Admitting: Psychology

## 2024-02-18 DIAGNOSIS — D2371 Other benign neoplasm of skin of right lower limb, including hip: Secondary | ICD-10-CM | POA: Diagnosis not present

## 2024-02-18 DIAGNOSIS — B351 Tinea unguium: Secondary | ICD-10-CM | POA: Diagnosis not present

## 2024-02-18 DIAGNOSIS — D689 Coagulation defect, unspecified: Secondary | ICD-10-CM

## 2024-02-18 DIAGNOSIS — M79676 Pain in unspecified toe(s): Secondary | ICD-10-CM

## 2024-02-18 NOTE — Progress Notes (Unsigned)
 He presents today with his cane and chief complaint of painful elongated toenails.  Objective: Vitals are stable alert oriented x 3.  Toenails are long thick yellow dystrophic with mycotic multiple benign skin lesions particular porokeratotic lesion plantar aspect of his right heel.  No open lesions or wounds.  Assessment: Pain limb secondary to onychomycosis and benign skin lesion.  Plan: Debridement of benign skin lesions and mycotic nails 1 through 5 bilateral.

## 2024-02-26 ENCOUNTER — Ambulatory Visit
Admission: RE | Admit: 2024-02-26 | Discharge: 2024-02-26 | Disposition: A | Discharge status: Home / Self Care | Source: Ambulatory Visit | Attending: Nurse Practitioner | Admitting: Nurse Practitioner

## 2024-02-26 DIAGNOSIS — Z8673 Personal history of transient ischemic attack (TIA), and cerebral infarction without residual deficits: Secondary | ICD-10-CM

## 2024-02-27 ENCOUNTER — Other Ambulatory Visit

## 2024-02-28 ENCOUNTER — Inpatient Hospital Stay: Admission: RE | Admit: 2024-02-28 | Source: Ambulatory Visit

## 2024-02-28 ENCOUNTER — Other Ambulatory Visit

## 2024-03-16 ENCOUNTER — Encounter: Attending: Physical Medicine and Rehabilitation | Admitting: Psychology

## 2024-03-17 ENCOUNTER — Other Ambulatory Visit: Payer: Self-pay | Admitting: Cardiovascular Disease

## 2024-03-17 DIAGNOSIS — I4891 Unspecified atrial fibrillation: Secondary | ICD-10-CM

## 2024-03-17 DIAGNOSIS — I639 Cerebral infarction, unspecified: Secondary | ICD-10-CM

## 2024-03-23 ENCOUNTER — Encounter (HOSPITAL_COMMUNITY): Payer: Self-pay | Admitting: Neurology

## 2024-03-24 ENCOUNTER — Other Ambulatory Visit (HOSPITAL_COMMUNITY): Payer: Self-pay | Admitting: Neurology

## 2024-03-24 DIAGNOSIS — G20C Parkinsonism, unspecified: Secondary | ICD-10-CM

## 2024-03-30 ENCOUNTER — Other Ambulatory Visit (HOSPITAL_COMMUNITY): Payer: Self-pay | Admitting: Neurology

## 2024-03-30 DIAGNOSIS — G20C Parkinsonism, unspecified: Secondary | ICD-10-CM

## 2024-03-30 DIAGNOSIS — G3184 Mild cognitive impairment, so stated: Secondary | ICD-10-CM

## 2024-04-02 ENCOUNTER — Encounter: Payer: Self-pay | Admitting: Pulmonary Disease

## 2024-04-09 ENCOUNTER — Encounter (HOSPITAL_COMMUNITY): Payer: PRIVATE HEALTH INSURANCE

## 2024-04-27 ENCOUNTER — Ambulatory Visit (HOSPITAL_COMMUNITY)
Admission: RE | Admit: 2024-04-27 | Discharge: 2024-04-27 | Disposition: A | Discharge status: Home / Self Care | Payer: PRIVATE HEALTH INSURANCE | Source: Ambulatory Visit | Attending: Cardiovascular Disease | Admitting: Cardiovascular Disease

## 2024-04-27 DIAGNOSIS — I083 Combined rheumatic disorders of mitral, aortic and tricuspid valves: Secondary | ICD-10-CM | POA: Insufficient documentation

## 2024-04-27 DIAGNOSIS — I4891 Unspecified atrial fibrillation: Secondary | ICD-10-CM | POA: Insufficient documentation

## 2024-04-27 DIAGNOSIS — I639 Cerebral infarction, unspecified: Secondary | ICD-10-CM | POA: Insufficient documentation

## 2024-04-27 DIAGNOSIS — I499 Cardiac arrhythmia, unspecified: Secondary | ICD-10-CM | POA: Diagnosis not present

## 2024-04-27 DIAGNOSIS — I517 Cardiomegaly: Secondary | ICD-10-CM | POA: Insufficient documentation

## 2024-04-27 DIAGNOSIS — Z8673 Personal history of transient ischemic attack (TIA), and cerebral infarction without residual deficits: Secondary | ICD-10-CM | POA: Diagnosis not present

## 2024-04-27 LAB — ECHOCARDIOGRAM COMPLETE
AR max vel: 1.54 cm2
AV Area VTI: 1.67 cm2
AV Area mean vel: 1.53 cm2
AV Mean grad: 25.3 mmHg
AV Peak grad: 47.1 mmHg
Ao pk vel: 3.43 m/s
Area-P 1/2: 3 cm2
P 1/2 time: 643 ms
S' Lateral: 2.9 cm

## 2024-04-28 DIAGNOSIS — I639 Cerebral infarction, unspecified: Secondary | ICD-10-CM

## 2024-04-28 DIAGNOSIS — I4891 Unspecified atrial fibrillation: Secondary | ICD-10-CM

## 2024-04-29 ENCOUNTER — Encounter: Payer: Self-pay | Admitting: Cardiology

## 2024-04-30 ENCOUNTER — Encounter (HOSPITAL_COMMUNITY)
Admission: RE | Admit: 2024-04-30 | Discharge: 2024-04-30 | Disposition: A | Payer: PRIVATE HEALTH INSURANCE | Source: Ambulatory Visit | Attending: Neurology

## 2024-04-30 DIAGNOSIS — G20C Parkinsonism, unspecified: Secondary | ICD-10-CM | POA: Diagnosis present

## 2024-04-30 MED ORDER — IOFLUPANE I 123 185 MBQ/2.5ML IV SOLN
5.0000 | Freq: Once | INTRAVENOUS | Status: AC | PRN
  Administered 2024-04-30: 3.85 via INTRAVENOUS

## 2024-05-11 ENCOUNTER — Ambulatory Visit (HOSPITAL_COMMUNITY): Admission: RE | Admit: 2024-05-11 | Payer: PRIVATE HEALTH INSURANCE | Source: Ambulatory Visit

## 2024-05-20 ENCOUNTER — Encounter (HOSPITAL_COMMUNITY): Payer: Self-pay

## 2024-05-25 ENCOUNTER — Other Ambulatory Visit: Payer: Self-pay

## 2024-05-25 ENCOUNTER — Emergency Department (HOSPITAL_COMMUNITY)

## 2024-05-25 ENCOUNTER — Observation Stay (HOSPITAL_COMMUNITY)
Admission: EM | Admit: 2024-05-25 | Discharge: 2024-05-29 | Disposition: A | Source: Home / Self Care | Attending: Internal Medicine | Admitting: Internal Medicine

## 2024-05-25 DIAGNOSIS — I350 Nonrheumatic aortic (valve) stenosis: Secondary | ICD-10-CM

## 2024-05-25 DIAGNOSIS — S12110A Anterior displaced Type II dens fracture, initial encounter for closed fracture: Secondary | ICD-10-CM | POA: Diagnosis not present

## 2024-05-25 DIAGNOSIS — E785 Hyperlipidemia, unspecified: Secondary | ICD-10-CM | POA: Diagnosis not present

## 2024-05-25 DIAGNOSIS — Z8673 Personal history of transient ischemic attack (TIA), and cerebral infarction without residual deficits: Secondary | ICD-10-CM | POA: Diagnosis not present

## 2024-05-25 DIAGNOSIS — I5030 Unspecified diastolic (congestive) heart failure: Secondary | ICD-10-CM | POA: Diagnosis not present

## 2024-05-25 DIAGNOSIS — I4821 Permanent atrial fibrillation: Secondary | ICD-10-CM | POA: Diagnosis not present

## 2024-05-25 DIAGNOSIS — I5032 Chronic diastolic (congestive) heart failure: Secondary | ICD-10-CM | POA: Diagnosis present

## 2024-05-25 DIAGNOSIS — E43 Unspecified severe protein-calorie malnutrition: Secondary | ICD-10-CM | POA: Insufficient documentation

## 2024-05-25 DIAGNOSIS — R001 Bradycardia, unspecified: Secondary | ICD-10-CM | POA: Diagnosis not present

## 2024-05-25 DIAGNOSIS — R55 Syncope and collapse: Principal | ICD-10-CM | POA: Diagnosis present

## 2024-05-25 DIAGNOSIS — R053 Chronic cough: Secondary | ICD-10-CM | POA: Diagnosis not present

## 2024-05-25 DIAGNOSIS — G20C Parkinsonism, unspecified: Secondary | ICD-10-CM

## 2024-05-25 DIAGNOSIS — L89151 Pressure ulcer of sacral region, stage 1: Secondary | ICD-10-CM | POA: Diagnosis not present

## 2024-05-25 DIAGNOSIS — R7989 Other specified abnormal findings of blood chemistry: Secondary | ICD-10-CM | POA: Diagnosis not present

## 2024-05-25 DIAGNOSIS — I110 Hypertensive heart disease with heart failure: Secondary | ICD-10-CM | POA: Diagnosis not present

## 2024-05-25 DIAGNOSIS — W19XXXA Unspecified fall, initial encounter: Secondary | ICD-10-CM | POA: Diagnosis not present

## 2024-05-25 DIAGNOSIS — I7121 Aneurysm of the ascending aorta, without rupture: Secondary | ICD-10-CM | POA: Diagnosis not present

## 2024-05-25 LAB — COMPREHENSIVE METABOLIC PANEL WITH GFR
ALT: 19 U/L (ref 0–44)
AST: 35 U/L (ref 15–41)
Albumin: 3.4 g/dL — ABNORMAL LOW (ref 3.5–5.0)
Alkaline Phosphatase: 172 U/L — ABNORMAL HIGH (ref 38–126)
Anion gap: 16 — ABNORMAL HIGH (ref 5–15)
BUN: 30 mg/dL — ABNORMAL HIGH (ref 8–23)
CO2: 26 mmol/L (ref 22–32)
Calcium: 8.5 mg/dL — ABNORMAL LOW (ref 8.9–10.3)
Chloride: 99 mmol/L (ref 98–111)
Creatinine, Ser: 1.25 mg/dL — ABNORMAL HIGH (ref 0.61–1.24)
GFR, Estimated: 53 mL/min — ABNORMAL LOW
Glucose, Bld: 69 mg/dL — ABNORMAL LOW (ref 70–99)
Potassium: 3.2 mmol/L — ABNORMAL LOW (ref 3.5–5.1)
Sodium: 141 mmol/L (ref 135–145)
Total Bilirubin: 0.6 mg/dL (ref 0.0–1.2)
Total Protein: 6.1 g/dL — ABNORMAL LOW (ref 6.5–8.1)

## 2024-05-25 LAB — CBC WITH DIFFERENTIAL/PLATELET
Abs Immature Granulocytes: 0.03 10*3/uL (ref 0.00–0.07)
Basophils Absolute: 0 10*3/uL (ref 0.0–0.1)
Basophils Relative: 0 %
Eosinophils Absolute: 0 10*3/uL (ref 0.0–0.5)
Eosinophils Relative: 1 %
HCT: 34.6 % — ABNORMAL LOW (ref 39.0–52.0)
Hemoglobin: 11.2 g/dL — ABNORMAL LOW (ref 13.0–17.0)
Immature Granulocytes: 1 %
Lymphocytes Relative: 19 %
Lymphs Abs: 0.9 10*3/uL (ref 0.7–4.0)
MCH: 28.6 pg (ref 26.0–34.0)
MCHC: 32.4 g/dL (ref 30.0–36.0)
MCV: 88.5 fL (ref 80.0–100.0)
Monocytes Absolute: 0.3 10*3/uL (ref 0.1–1.0)
Monocytes Relative: 6 %
Neutro Abs: 3.5 10*3/uL (ref 1.7–7.7)
Neutrophils Relative %: 73 %
Platelets: 176 10*3/uL (ref 150–400)
RBC: 3.91 MIL/uL — ABNORMAL LOW (ref 4.22–5.81)
RDW: 17.7 % — ABNORMAL HIGH (ref 11.5–15.5)
WBC: 4.7 10*3/uL (ref 4.0–10.5)
nRBC: 0 % (ref 0.0–0.2)

## 2024-05-25 LAB — I-STAT CHEM 8, ED
BUN: 31 mg/dL — ABNORMAL HIGH (ref 8–23)
Calcium, Ion: 1.03 mmol/L — ABNORMAL LOW (ref 1.15–1.40)
Chloride: 99 mmol/L (ref 98–111)
Creatinine, Ser: 1.4 mg/dL — ABNORMAL HIGH (ref 0.61–1.24)
Glucose, Bld: 69 mg/dL — ABNORMAL LOW (ref 70–99)
HCT: 33 % — ABNORMAL LOW (ref 39.0–52.0)
Hemoglobin: 11.2 g/dL — ABNORMAL LOW (ref 13.0–17.0)
Potassium: 3.2 mmol/L — ABNORMAL LOW (ref 3.5–5.1)
Sodium: 140 mmol/L (ref 135–145)
TCO2: 25 mmol/L (ref 22–32)

## 2024-05-25 LAB — TROPONIN T, HIGH SENSITIVITY
Troponin T High Sensitivity: 167 ng/L (ref 0–19)
Troponin T High Sensitivity: 168 ng/L (ref 0–19)

## 2024-05-25 LAB — URINALYSIS, ROUTINE W REFLEX MICROSCOPIC
Bilirubin Urine: NEGATIVE
Glucose, UA: NEGATIVE mg/dL
Hgb urine dipstick: NEGATIVE
Ketones, ur: NEGATIVE mg/dL
Leukocytes,Ua: NEGATIVE
Nitrite: NEGATIVE
Protein, ur: NEGATIVE mg/dL
Specific Gravity, Urine: 1.005 (ref 1.005–1.030)
pH: 7 (ref 5.0–8.0)

## 2024-05-25 LAB — I-STAT CG4 LACTIC ACID, ED
Lactic Acid, Venous: 2.1 mmol/L (ref 0.5–1.9)
Lactic Acid, Venous: 1.6 mmol/L (ref 0.5–1.9)

## 2024-05-25 LAB — PROCALCITONIN: Procalcitonin: <0.1 ng/mL

## 2024-05-25 LAB — CBG MONITORING, ED
Glucose-Capillary: 72 mg/dL (ref 70–99)
Glucose-Capillary: 70 mg/dL (ref 70–99)
Glucose-Capillary: 77 mg/dL (ref 70–99)

## 2024-05-25 LAB — MAGNESIUM: Magnesium: 1.6 mg/dL — ABNORMAL LOW (ref 1.7–2.4)

## 2024-05-25 MED ORDER — MAGNESIUM SULFATE 4 GM/100ML IV SOLN
4.0000 g | Freq: Once | INTRAVENOUS | Status: AC
  Administered 2024-05-25: 4 g via INTRAVENOUS
  Filled 2024-05-25: qty 100

## 2024-05-25 MED ORDER — AZITHROMYCIN 250 MG PO TABS
500.0000 mg | ORAL_TABLET | Freq: Every day | ORAL | Status: DC
  Administered 2024-05-25: 500 mg via ORAL
  Filled 2024-05-25: qty 2

## 2024-05-25 MED ORDER — DEXTROSE IN LACTATED RINGERS 5 % IV SOLN: INTRAVENOUS | Status: AC

## 2024-05-25 MED ORDER — LACTATED RINGERS IV BOLUS
500.0000 mL | Freq: Once | INTRAVENOUS | Status: AC
  Administered 2024-05-25: 500 mL via INTRAVENOUS

## 2024-05-25 MED ORDER — SODIUM CHLORIDE 0.9 % IV SOLN: 100.0000 mg | Freq: Two times a day (BID) | INTRAVENOUS | Status: DC

## 2024-05-25 MED ORDER — POTASSIUM CHLORIDE 10 MEQ/100ML IV SOLN
10.0000 meq | INTRAVENOUS | Status: AC
  Administered 2024-05-25 – 2024-05-26 (×6): 10 meq via INTRAVENOUS
  Filled 2024-05-25 (×6): qty 100

## 2024-05-25 MED ORDER — SODIUM CHLORIDE 0.9 % IV SOLN: 1.0000 g | INTRAVENOUS | Status: DC

## 2024-05-25 MED ORDER — IOHEXOL 350 MG/ML SOLN
75.0000 mL | Freq: Once | INTRAVENOUS | Status: AC | PRN
  Administered 2024-05-25: 75 mL via INTRAVENOUS

## 2024-05-25 MED ORDER — SODIUM CHLORIDE 0.9 % IV SOLN
1.0000 g | Freq: Once | INTRAVENOUS | Status: AC
  Administered 2024-05-25: 1 g via INTRAVENOUS
  Filled 2024-05-25: qty 10

## 2024-05-25 MED ORDER — LIDOCAINE 5 % EX PTCH
1.0000 | MEDICATED_PATCH | Freq: Every day | CUTANEOUS | Status: DC | PRN
  Administered 2024-05-25: 1 via TRANSDERMAL
  Filled 2024-05-25 (×2): qty 1

## 2024-05-25 NOTE — ED Triage Notes (Signed)
 PT BIB [facility] from home after having multiple syncopal episodes this past weekend, witnessed by wife and home health nurse. PT was witnessed and did not fall/ injure themself. PT recently discharged from extensive stays at [facility] and [facility]. PT states generalized body aches. Endorses aphasia like s/s r/t weakness.   EMS VS: 102/88 BP, HR 70's Afib, Spo2 96% RA

## 2024-05-25 NOTE — Hospital Course (Signed)
 This morning, not at sharp as normal. Hearing issues at [facility]. He was fuzzy. Could not remember phone number. Saw stars and almost passed out. This has happened several times before. Attributed to vasovagal. September 16th started declining - sleeping all the time. Severe constipation. Confused at times.   Lipitor 10 mg daily Celebrex  100 mg daily prn Lidocaine  patch daily prn Bumex 1 mg BID Xarelto  15 mg daily Flomax  0.4 mg at bedtime (has been taking X 2 the passed three nights) Xanax  0.125 - 0.25 mg at bedtime prn Carbidopa -Levodopa  10-100 mg (prescribed 6 but transitioned to 4 on Friday) Modafinil  200 mg daily Phospha 125 mg TID Magnesium  Gluconate 500 (BID) Has not been taking it the last three days  Did not feel anxious, it happened all of a sudden. No HA. Both episodes (Saturday and today) happened soon after breakfast. No chest pain, no abdominal pain, no SOB.   Social: Lives in [place] with wife  Support: Independent in ADL iADL's, does use a walker since neck injury.   Parathyroid nodule found which have been contributing to hypercalcemia.

## 2024-05-26 ENCOUNTER — Observation Stay (HOSPITAL_COMMUNITY)

## 2024-05-26 DIAGNOSIS — R7989 Other specified abnormal findings of blood chemistry: Secondary | ICD-10-CM | POA: Diagnosis not present

## 2024-05-26 DIAGNOSIS — W19XXXA Unspecified fall, initial encounter: Secondary | ICD-10-CM | POA: Diagnosis not present

## 2024-05-26 DIAGNOSIS — S12110A Anterior displaced Type II dens fracture, initial encounter for closed fracture: Secondary | ICD-10-CM | POA: Diagnosis not present

## 2024-05-26 DIAGNOSIS — G20C Parkinsonism, unspecified: Secondary | ICD-10-CM

## 2024-05-26 DIAGNOSIS — N400 Enlarged prostate without lower urinary tract symptoms: Secondary | ICD-10-CM | POA: Diagnosis not present

## 2024-05-26 DIAGNOSIS — S12100A Unspecified displaced fracture of second cervical vertebra, initial encounter for closed fracture: Secondary | ICD-10-CM | POA: Diagnosis not present

## 2024-05-26 DIAGNOSIS — R55 Syncope and collapse: Principal | ICD-10-CM

## 2024-05-26 DIAGNOSIS — E785 Hyperlipidemia, unspecified: Secondary | ICD-10-CM | POA: Diagnosis not present

## 2024-05-26 DIAGNOSIS — I7121 Aneurysm of the ascending aorta, without rupture: Secondary | ICD-10-CM | POA: Diagnosis not present

## 2024-05-26 DIAGNOSIS — I4821 Permanent atrial fibrillation: Secondary | ICD-10-CM | POA: Diagnosis not present

## 2024-05-26 DIAGNOSIS — M1990 Unspecified osteoarthritis, unspecified site: Secondary | ICD-10-CM

## 2024-05-26 DIAGNOSIS — R001 Bradycardia, unspecified: Secondary | ICD-10-CM

## 2024-05-26 DIAGNOSIS — R053 Chronic cough: Secondary | ICD-10-CM | POA: Diagnosis not present

## 2024-05-26 DIAGNOSIS — I5030 Unspecified diastolic (congestive) heart failure: Secondary | ICD-10-CM

## 2024-05-26 DIAGNOSIS — Z8673 Personal history of transient ischemic attack (TIA), and cerebral infarction without residual deficits: Secondary | ICD-10-CM | POA: Diagnosis not present

## 2024-05-26 DIAGNOSIS — I110 Hypertensive heart disease with heart failure: Secondary | ICD-10-CM

## 2024-05-26 DIAGNOSIS — L89151 Pressure ulcer of sacral region, stage 1: Secondary | ICD-10-CM

## 2024-05-26 LAB — RESPIRATORY PANEL BY PCR

## 2024-05-26 LAB — COMPREHENSIVE METABOLIC PANEL WITH GFR
ALT: 16 U/L (ref 0–44)
AST: 33 U/L (ref 15–41)
Albumin: 3 g/dL — ABNORMAL LOW (ref 3.5–5.0)
Alkaline Phosphatase: 153 U/L — ABNORMAL HIGH (ref 38–126)
Anion gap: 12 (ref 5–15)
BUN: 24 mg/dL — ABNORMAL HIGH (ref 8–23)
CO2: 27 mmol/L (ref 22–32)
Calcium: 8.3 mg/dL — ABNORMAL LOW (ref 8.9–10.3)
Chloride: 100 mmol/L (ref 98–111)
Creatinine, Ser: 1.13 mg/dL (ref 0.61–1.24)
GFR, Estimated: >60 mL/min
Glucose, Bld: 85 mg/dL (ref 70–99)
Potassium: 3.6 mmol/L (ref 3.5–5.1)
Sodium: 139 mmol/L (ref 135–145)
Total Bilirubin: 0.4 mg/dL (ref 0.0–1.2)
Total Protein: 5.5 g/dL — ABNORMAL LOW (ref 6.5–8.1)

## 2024-05-26 LAB — CBG MONITORING, ED
Glucose-Capillary: 103 mg/dL — ABNORMAL HIGH (ref 70–99)
Glucose-Capillary: 76 mg/dL (ref 70–99)
Glucose-Capillary: 83 mg/dL (ref 70–99)
Glucose-Capillary: 85 mg/dL (ref 70–99)

## 2024-05-26 LAB — CBC WITH DIFFERENTIAL/PLATELET
Abs Immature Granulocytes: 0.04 10*3/uL (ref 0.00–0.07)
Basophils Absolute: 0 10*3/uL (ref 0.0–0.1)
Basophils Relative: 1 %
Eosinophils Absolute: 0.1 10*3/uL (ref 0.0–0.5)
Eosinophils Relative: 1 %
HCT: 30.8 % — ABNORMAL LOW (ref 39.0–52.0)
Hemoglobin: 10.1 g/dL — ABNORMAL LOW (ref 13.0–17.0)
Immature Granulocytes: 1 %
Lymphocytes Relative: 21 %
Lymphs Abs: 0.9 10*3/uL (ref 0.7–4.0)
MCH: 29.4 pg (ref 26.0–34.0)
MCHC: 32.8 g/dL (ref 30.0–36.0)
MCV: 89.5 fL (ref 80.0–100.0)
Monocytes Absolute: 0.3 10*3/uL (ref 0.1–1.0)
Monocytes Relative: 7 %
Neutro Abs: 3 10*3/uL (ref 1.7–7.7)
Neutrophils Relative %: 69 %
Platelets: 159 10*3/uL (ref 150–400)
RBC: 3.44 MIL/uL — ABNORMAL LOW (ref 4.22–5.81)
RDW: 17.7 % — ABNORMAL HIGH (ref 11.5–15.5)
WBC: 4.3 10*3/uL (ref 4.0–10.5)
nRBC: 0 % (ref 0.0–0.2)

## 2024-05-26 LAB — EXPECTORATED SPUTUM ASSESSMENT W GRAM STAIN, RFLX TO RESP C

## 2024-05-26 LAB — RESP PANEL BY RT-PCR (RSV, FLU A&B, COVID)  RVPGX2
Influenza A by PCR: NEGATIVE
Influenza B by PCR: NEGATIVE
Resp Syncytial Virus by PCR: POSITIVE — AB
SARS Coronavirus 2 by RT PCR: NEGATIVE

## 2024-05-26 LAB — PHOSPHORUS: Phosphorus: 2.2 mg/dL — ABNORMAL LOW (ref 2.5–4.6)

## 2024-05-26 LAB — MAGNESIUM: Magnesium: 2.8 mg/dL — ABNORMAL HIGH (ref 1.7–2.4)

## 2024-05-26 LAB — POTASSIUM: Potassium: 3.5 mmol/L (ref 3.5–5.1)

## 2024-05-26 MED ORDER — POTASSIUM PHOSPHATES 15 MMOLE/5ML IV SOLN
30.0000 mmol | Freq: Once | INTRAVENOUS | Status: AC
  Administered 2024-05-26: 30 mmol via INTRAVENOUS
  Filled 2024-05-26: qty 10

## 2024-05-26 MED ORDER — RIVAROXABAN 15 MG PO TABS
15.0000 mg | ORAL_TABLET | Freq: Every day | ORAL | Status: DC
  Administered 2024-05-26: 15 mg via ORAL
  Filled 2024-05-26: qty 1

## 2024-05-26 MED ORDER — MODAFINIL 100 MG PO TABS
100.0000 mg | ORAL_TABLET | Freq: Every day | ORAL | Status: DC
  Administered 2024-05-27 – 2024-05-28 (×2): 100 mg via ORAL
  Filled 2024-05-26 (×2): qty 1

## 2024-05-26 MED ORDER — CARBIDOPA-LEVODOPA 10-100 MG PO TABS
1.0000 | ORAL_TABLET | Freq: Three times a day (TID) | ORAL | Status: DC
  Administered 2024-05-26 – 2024-05-28 (×7): 1 via ORAL
  Filled 2024-05-26 (×11): qty 1

## 2024-05-26 NOTE — Evaluation (Signed)
 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     Clinical Impressions Giovanny was evaluated s/p the above admission list. He has assist from caregiver and his wife for ADLs at baseline and uses a RW/rollator for mobility. Of note, pt recently returned home from SNF. Upon evaluation the pt was limited by [facility], weakness, unsteady balance and poor activity tolerance. Overall he needed min A for bed mobility, to stand and to alk a short distance with HHA. Due to the deficits listed below the pt also needs up to mod A for LB ADLs and set up A for UB ADLs in sitting. Pt will benefit from continued acute OT services and HHOT.     If plan is discharge home, recommend the following:   A little help with walking and/or transfers;A lot of help with bathing/dressing/bathroom;Assistance with cooking/housework;Assist for transportation;Help with stairs or ramp for entrance     Functional Status Assessment   Patient has had a recent decline in their functional status and demonstrates the ability to make significant improvements in function in a reasonable and predictable amount of time.     Equipment Recommendations   None recommended by OT      Precautions/Restrictions   Precautions Precautions: Fall Recall of Precautions/Restrictions: Intact Required Braces or Orthoses: Cervical Brace Cervical Brace: Other (comment) (pt was wearing C collar on arrival, he states he only wears it at night and removes during the day) Restrictions Weight  Bearing Restrictions Per Provider Order: No     Mobility Bed Mobility Overal bed mobility: Needs Assistance Bed Mobility: Supine to Sit, Sit to Supine     Supine to sit: Min assist Sit to supine: Min assist   General bed mobility comments: would likely do better when not on a stretcher    Transfers Overall transfer level: Needs assistance Equipment used: 1 person hand held assist Transfers: Sit to/from Stand Sit to Stand: Min assist           General transfer comment: min A for balance and for 63ft ambulation      Balance Overall balance assessment: Needs assistance Sitting-balance support: Bilateral upper extremity supported, Feet supported Sitting balance-Leahy Scale: Good     Standing balance support: Bilateral upper extremity supported, During functional activity Standing balance-Leahy Scale: Poor           ADL either performed or assessed with clinical judgement   ADL Overall ADL's : Needs assistance/impaired Eating/Feeding: NPO   Grooming: Set up;Sitting   Upper Body Bathing: Set up;Sitting   Lower Body Bathing: Sit to/from stand;Minimal assistance   Upper Body Dressing : Set up;Sitting   Lower Body Dressing: Moderate assistance;Sit to/from stand   Toilet Transfer: Minimal assistance;Ambulation Toilet Transfer Details (indicate cue type and reason): walked 21ft on eval with HHA         Functional mobility during ADLs: Minimal assistance General ADL Comments: limited by generalized weakness, balance and activity tolerance     Vision Baseline Vision/History: 0 No visual deficits Vision Assessment?: No apparent visual deficits     Perception Perception: Within Functional Limits  Praxis Praxis: [facility]         Extremity/Trunk Assessment Upper Extremity Assessment Upper Extremity Assessment: Generalized weakness   Lower Extremity Assessment Lower Extremity Assessment: Defer to PT evaluation   Cervical / Trunk Assessment Cervical /  Trunk Assessment: Kyphotic   Communication Communication Communication: Impaired Factors Affecting Communication: Hearing impaired   Cognition Arousal: Alert Behavior During Therapy: Flat affect, WFL for tasks assessed/performed Cognition: No family/caregiver present to determine baseline             OT - Cognition Comments: HOH, pt followed mos tsimple commands and confirmed PLOF/home set up.                 Following commands: Intact       Cueing  General Comments   Cueing Techniques: Verbal cues  VSS with 3L   Exercises     Shoulder Instructions      Home Living Family/patient expects to be discharged to:: Private residence Living Arrangements: Spouse/significant other Available Help at Discharge: Family;Available 24 hours/day Type of Home: House Home Access: Stairs to enter [employer] of Steps: 3 steps Entrance Stairs-Rails: Left;Right;Can reach both Home Layout: Bed/bath upstairs;Multi-level Alternate Level Stairs-Number of Steps: chair lift to get upstairs to bedroom   Bathroom Shower/Tub: Chief Strategy Officer: Standard Bathroom Accessibility: Yes   Home Equipment: Agricultural Consultant (2 wheels);Cane - quad;Shower seat;BSC/3in1;Rollator (4 wheels)   Additional Comments: he is currently active with HH OP/PT/SLP      Prior Functioning/Environment Prior Level of Function : Needs assist             Mobility Comments: ambulation with RW upstairs and rollator downstairs ADLs Comments: aide assists with LB dressing, driving, and entertainment during the day.    OT Problem List: Decreased range of motion;Decreased activity tolerance;Decreased cognition;Decreased knowledge of use of DME or AE;Decreased safety awareness   OT Treatment/Interventions: Self-care/ADL training;Therapeutic exercise;DME and/or AE instruction;Therapeutic activities;Patient/family education;Balance training      OT Goals(Current goals can be found  in the care plan section)   Acute Rehab OT Goals Patient Stated Goal: home OT Goal Formulation: With patient Time For Goal Achievement: 06/09/24 Potential to Achieve Goals: Good ADL Goals Pt Will Perform Grooming: with modified independence Pt Will Perform Lower Body Dressing: with contact guard assist;sit to/from stand Pt Will Transfer to Toilet: with supervision;ambulating   OT Frequency:  Min 2X/week       AM-PAC OT 6 Clicks Daily Activity     Outcome Measure Help from another person eating meals?: Total Help from another person taking care of personal grooming?: A Little Help from another person toileting, which includes using toliet, bedpan, or urinal?: A Little Help from another person bathing (including washing, rinsing, drying)?: A Little Help from another person to put on and taking off regular upper body clothing?: A Little Help from another person to put on and taking off regular lower body clothing?: A Lot 6 Click Score: 15   End of Session Equipment Utilized During Treatment: Gait belt;Oxygen Nurse Communication: Mobility status  Activity Tolerance: Patient tolerated treatment well Patient left: in bed;with call bell/phone within reach  OT Visit Diagnosis: Unsteadiness on feet (R26.81);Other abnormalities of gait and mobility (R26.89);Muscle weakness (generalized) (M62.81)                Time: 9144-9081 OT Time Calculation (min): 23 min Charges:  OT General Charges $OT Visit: 1 Visit OT Evaluation $OT Eval Moderate Complexity: 1 Mod OT Treatments $Therapeutic Activity:  8-22 mins  Lucie Kendall, OTR/L Acute Rehabilitation Services Office [phone] Secure Chat Communication Preferred   Lucie JONETTA Kendall 05/26/2024, 2:01 PM

## 2024-05-26 NOTE — ED Notes (Signed)
 Patient's wife provided with update, she is requesting nutrition consult for patient. Also would like to make sure staff is aware patient is kosher.

## 2024-05-26 NOTE — ED Notes (Signed)
 Pt. Desat into the 60s when sleeping. Placed on nasal can 3L.

## 2024-05-26 NOTE — ED Notes (Signed)
Floor notified.

## 2024-05-27 ENCOUNTER — Inpatient Hospital Stay (HOSPITAL_COMMUNITY)

## 2024-05-27 DIAGNOSIS — I350 Nonrheumatic aortic (valve) stenosis: Secondary | ICD-10-CM

## 2024-05-27 LAB — GLUCOSE, CAPILLARY
Glucose-Capillary: 109 mg/dL — ABNORMAL HIGH (ref 70–99)
Glucose-Capillary: 84 mg/dL (ref 70–99)
Glucose-Capillary: 85 mg/dL (ref 70–99)
Glucose-Capillary: 87 mg/dL (ref 70–99)
Glucose-Capillary: 93 mg/dL (ref 70–99)
Glucose-Capillary: 98 mg/dL (ref 70–99)

## 2024-05-27 LAB — BASIC METABOLIC PANEL WITH GFR
Anion gap: 8 (ref 5–15)
BUN: 17 mg/dL (ref 8–23)
CO2: 28 mmol/L (ref 22–32)
Calcium: 8.5 mg/dL — ABNORMAL LOW (ref 8.9–10.3)
Chloride: 104 mmol/L (ref 98–111)
Creatinine, Ser: 0.9 mg/dL (ref 0.61–1.24)
GFR, Estimated: >60 mL/min
Glucose, Bld: 87 mg/dL (ref 70–99)
Potassium: 3.7 mmol/L (ref 3.5–5.1)
Sodium: 140 mmol/L (ref 135–145)

## 2024-05-27 LAB — CBC
HCT: 32.3 % — ABNORMAL LOW (ref 39.0–52.0)
Hemoglobin: 10.3 g/dL — ABNORMAL LOW (ref 13.0–17.0)
MCH: 28.6 pg (ref 26.0–34.0)
MCHC: 31.9 g/dL (ref 30.0–36.0)
MCV: 89.7 fL (ref 80.0–100.0)
Platelets: 153 10*3/uL (ref 150–400)
RBC: 3.6 MIL/uL — ABNORMAL LOW (ref 4.22–5.81)
RDW: 17.8 % — ABNORMAL HIGH (ref 11.5–15.5)
WBC: 4.4 10*3/uL (ref 4.0–10.5)
nRBC: 0 % (ref 0.0–0.2)

## 2024-05-27 LAB — ECHOCARDIOGRAM LIMITED
AR max vel: 1.22 cm2
AV Area VTI: 1.09 cm2
AV Area mean vel: 1.01 cm2
AV Mean grad: 44 mmHg
AV Peak grad: 64.6 mmHg
Ao pk vel: 4.02 m/s
Height: 68 in
MV VTI: 3.24 cm2
P 1/2 time: 254 ms
S' Lateral: 2.4 cm
Weight: 2246.4 [oz_av]

## 2024-05-27 LAB — PHOSPHORUS: Phosphorus: 3.1 mg/dL (ref 2.5–4.6)

## 2024-05-27 LAB — MAGNESIUM: Magnesium: 2.5 mg/dL — ABNORMAL HIGH (ref 1.7–2.4)

## 2024-05-27 MED ORDER — ENOXAPARIN SODIUM 60 MG/0.6ML IJ SOSY: 60.0000 mg | PREFILLED_SYRINGE | Freq: Two times a day (BID) | INTRAMUSCULAR | Status: DC

## 2024-05-27 MED ORDER — DEXTROSE IN LACTATED RINGERS 5 % IV SOLN: INTRAVENOUS | Status: AC

## 2024-05-27 MED ORDER — FONDAPARINUX SODIUM 7.5 MG/0.6ML ~~LOC~~ SOLN
7.5000 mg | SUBCUTANEOUS | Status: DC
  Administered 2024-05-27: 7.5 mg via SUBCUTANEOUS
  Filled 2024-05-27 (×2): qty 0.6

## 2024-05-27 NOTE — Progress Notes (Signed)
 Pt has his neck brace on and is sleeping

## 2024-05-27 NOTE — Progress Notes (Signed)
 Initial Nutrition Assessment  DOCUMENTATION CODES:  Severe malnutrition in context of chronic illness  INTERVENTION:  Diet recommendations per SLP Of note pt follows Kosher diet Discussed with MD and pt's wife recommendation for Palliative Care consult for discussion of code status and nutritional goals of care  NUTRITION DIAGNOSIS:  Severe Malnutrition related to chronic illness (HF, Parkinson's, prolonged inadequate oral intake) as evidenced by severe fat depletion, severe muscle depletion.  GOAL:  Patient will meet greater than or equal to 90% of their needs  MONITOR:  Labs, Weight trends, Diet advancement  REASON FOR ASSESSMENT:  Consult Assessment of nutrition requirement/status  ASSESSMENT:  Pt admitted for work up for near syncope and PNA. PMH significant for permanent afib, HFpEF (EF 65-70%), HTN, HLD, CVA (08/2021), Parkinson's syndrome.  S/p MBS on 2/3 which reflects penetration above the vocal cords that did not clear occured with thin, nectar thick and honey thick liquids.  Aspiration of PO's will continue to occur and patient continues to be at a heightened risk of developing bacterial aspiration pneumonia as well as decompensation from pneumonia.   SLP discussion with pt and wife post MBS. Per SLP and MD plan for full liquid diet/thin consistency with pt and wife accepting of aspiration risk with PO as long as pt is receiving Parkinson's medication to aid in swallow function.   Spoke with pt's wife via phone call. Provided therapeutic listening as she expresses frustrations over diet order, communication and patient and her desire for no artificial feeding via PEG tube and NG tube.   She reports that his intake over the last year has been a roller coaster. He has had a couple admissions to [facility]. Following a fall last year, he had a very poor appetite. He was finally returning to a better appetite then had another admission at which time she reports he had a MBS which  reflected severe swallow issues. She reports pt was on a diet for comfort feeding assuming aspiration risk. Following this admission, he was not receiving kosher foods therefore stopped eating as the foods he was provided did not align with diet preferences.   Pt's wife continues to re-iterate understanding of aspiration risk with any oral intake though hopes to continue to work on swallow strategies to reduce risk although acknowledging continued risk for aspiration. Elicited thoughts with pt's wife on Palliative Care consultation in effort to streamline pt's wishes and goals of care given full scope of care and desire for comfort feeds. She is open to this and mentions having had Palliative Care team on board during admission to outside hospital. Discussed diet and palliative care recommendation with MD including SLP.   Weights on file reflect lots of variability within the last year making it difficult to assess weight for true trends though overall it appears pt's weight has been trending down.   Medications: D5 in LR @ 9ml/hr  Labs:  Mg 2.5 CBG's 85-103 x24 hours  NUTRITION - FOCUSED PHYSICAL EXAM:  Flowsheet Row Most Recent Value  Orbital Region Severe depletion  Upper Arm Region Severe depletion  Thoracic and Lumbar Region Severe depletion  Buccal Region Severe depletion  Temple Region Severe depletion  Clavicle Bone Region Severe depletion  Clavicle and Acromion Bone Region Severe depletion  Scapular Bone Region Severe depletion  Dorsal Hand Severe depletion  Patellar Region Severe depletion  Anterior Thigh Region Severe depletion  Posterior Calf Region Severe depletion  Edema (RD Assessment) None  Hair Reviewed  Eyes Reviewed  Mouth Reviewed  Skin Reviewed  Nails Reviewed    Diet Order:   Diet Order             Diet full liquid Room service appropriate? Yes; Fluid consistency: Thin  Diet effective now                   EDUCATION NEEDS:   No education  needs have been identified at this time  Skin:  Skin Assessment: Reviewed RN Assessment  Last BM:  2/3  Height:   Ht Readings from Last 1 Encounters:  05/25/24 5' 8 (1.727 m)    Weight:   Wt Readings from Last 1 Encounters:  05/27/24 63.7 kg   BMI:  Body mass index is 21.35 kg/m.  Estimated Nutritional Needs:   Kcal:  1600-1800  Protein:  80-95g  Fluid:  >/=1.6L   Allie Desmon Hitchner, RDN, LDN Clinical Nutrition See AMiON for contact information.

## 2024-05-27 NOTE — Progress Notes (Signed)
 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   OT comments  Patient demonstrating good gains with OT treatment with supervision to get to EOB and CGA to stand from elevated bed and to ambulate to sink. Patient able to stand at sink for grooming tasks with CGA for safety with seated rest break following. Patient declined further bathing and changed socks with min assist before returning to supine.  Discharge recommendations for HHOT continues to be appropriate.  Acute OT to continue to follow to address established goals.       If plan is discharge home, recommend the following:  A little help with walking and/or transfers;A lot of help with bathing/dressing/bathroom;Assistance with cooking/housework;Assist for transportation;Help with stairs or ramp for entrance   Equipment Recommendations  None recommended by OT    Recommendations for Other Services      Precautions / Restrictions Precautions Precautions: Fall Recall of Precautions/Restrictions: Intact Required Braces or Orthoses: Cervical Brace Cervical Brace: Other (comment) (Ptint states he only wears it at night and removes during the day, patient not wearing collar this am) Restrictions Weight Bearing Restrictions Per Provider Order: No       Mobility Bed Mobility Overal bed mobility: Needs Assistance Bed Mobility: Supine to Sit, Sit to Supine     Supine to sit: Supervision, Used rails Sit to supine: Supervision, Used rails   General bed mobility comments: used rail to  assist and verbal cues    Transfers Overall transfer level: Needs assistance Equipment used: Rolling walker (2 wheels) Transfers: Sit to/from Stand Sit to Stand: Contact guard assist, Min assist, From elevated surface           General transfer comment: CGA to stand from raised bed and min assist from chair with cues for hand placement     Balance Overall balance assessment: Needs assistance Sitting-balance support: Bilateral upper extremity supported, Feet supported Sitting balance-Leahy Scale: Good Sitting balance - Comments: EOB   Standing balance support: Single extremity supported, Bilateral upper extremity supported, During functional activity Standing balance-Leahy Scale: Poor Standing balance comment: reliant on external support when standing                           ADL either performed or assessed with clinical judgement   ADL Overall ADL's : Needs assistance/impaired Eating/Feeding: Set up   Grooming: Wash/dry hands;Wash/dry face;Oral care;Contact guard assist;Set up;Standing Grooming Details (indicate cue type and reason): at sink with seated rest break following             Lower Body Dressing: Minimal assistance;Sitting/lateral leans Lower Body Dressing Details (indicate cue type and reason): changed socks with figure 4               General ADL Comments: performed grooming standing at sink, declined further bathing    Extremity/Trunk Assessment              Vision       Perception     Praxis     Communication Communication Communication: Impaired Factors Affecting Communication: Hearing impaired  Cognition Arousal: Alert Behavior During Therapy: Flat affect, WFL for tasks assessed/performed Cognition: No family/caregiver present to determine baseline             OT - Cognition Comments: HOH, able to answer orientation questions                 Following commands: Intact        Cueing   Cueing  Techniques: Verbal cues  Exercises      Shoulder Instructions       General Comments VSS on RA    Pertinent Vitals/ Pain       Pain Assessment Pain Assessment: No/denies pain  Home Living                                          Prior Functioning/Environment              Frequency  Min 2X/week        Progress Toward Goals  OT Goals(current goals can now be found in the care plan section)  Progress towards OT goals: Progressing toward goals  Acute Rehab OT Goals Patient Stated Goal: to go home OT Goal Formulation: With patient Time For Goal Achievement: 06/09/24 Potential to Achieve Goals: Good ADL Goals Pt Will Perform Grooming: with modified independence Pt Will Perform Lower Body Dressing: with contact guard assist;sit to/from stand Pt Will Transfer to Toilet: with supervision;ambulating  Plan      Co-evaluation                 AM-PAC OT 6 Clicks Daily Activity     Outcome Measure   Help from another person eating meals?: A Little Help from another person taking care of personal grooming?: A Little Help from another person toileting, which includes using toliet, bedpan, or urinal?: A Little Help from another person bathing (including washing, rinsing, drying)?: A Little Help from another person to put on and taking off regular upper body clothing?: A Little Help from another person to put on and taking off regular lower body clothing?: A Little 6 Click Score: 18    End of Session Equipment Utilized During Treatment: Gait belt;Rolling walker (2 wheels)  OT Visit Diagnosis: Unsteadiness on feet (R26.81);Other abnormalities of gait and mobility (R26.89);Muscle weakness (generalized) (M62.81)   Activity Tolerance Patient tolerated treatment well   Patient Left in bed;with call bell/phone within reach;with bed alarm set   Nurse Communication Mobility status        Time: 9154-9096 OT Time Calculation (min): 18  min  Charges: OT General Charges $OT Visit: 1 Visit OT Treatments $Self Care/Home Management : 8-22 mins  Zachary Underwood, OTA Acute Rehabilitation Services  Office [phone]   Zachary Underwood 05/27/2024, 9:13 AM

## 2024-05-27 NOTE — Progress Notes (Signed)
" ° °  Quick follow-up note  Awaiting results of echocardiogram to come talk to the family. Will review the echocardiogram with structural heart team and come talk to him with the results are done.  Preliminary read would suggest that aortic stenosis is probably in the severe stage, and I think it is worthwhile to discuss with the structural heart team about potential options to see if he would potentially be a TAVR candidate before I talk to The patient and family.  I still think that his reason for this recent syncopal episode was probably having to do with now being diagnosed with RSV, poor p.o. intake in the setting of advanced age, A-fib, aortic stenosis, potential pauses.  We are not likely doing the acutely for the aortic stenosis during his hospitalization, but I would like to have a plan developed with the structural heart team prior to discharge.  Full note to follow.    Alm Clay, MD  "

## 2024-05-27 NOTE — Plan of Care (Signed)

## 2024-05-27 NOTE — Progress Notes (Addendum)
 Mobility Specialist: Progress Note   05/27/24 1000  Mobility  Activity Ambulated with assistance  Level of Assistance Contact guard assist, steadying assist  Assistive Device Front wheel walker  Distance Ambulated (ft) 100 ft  Activity Response Tolerated well  Mobility Referral Yes  Mobility visit 1 Mobility  Mobility Specialist Start Time (ACUTE ONLY) 1010  Mobility Specialist Stop Time (ACUTE ONLY) 1024  Mobility Specialist Time Calculation (min) (ACUTE ONLY) 14 min    Pt received in bed, agreeable to mobility session. SV for bed mobility. CGA for hallway ambulation. Tolerated well. SpO2 97% on RA. Returned to room for BR urgency. Left in the BR and instructed to call bell string when ready. No complaints. All needs met.   Ileana Lute Mobility Specialist Please contact via SecureChat or Rehab office at [phone]

## 2024-05-27 NOTE — Progress Notes (Signed)
" °  Echocardiogram 2D Echocardiogram has been performed.  Devora Ellouise SAUNDERS 05/27/2024, 12:00 PM "

## 2024-05-27 NOTE — Plan of Care (Addendum)
 Transfer service from IM teaching per patient request  Zachary Underwood is a 89 year old male with HTN, HLD, permanent A-fib, chronic heart failure with preserved EF, moderate-severe aortic stenosis, history of CVA, s/p repair of thoracic aortic dissection in 2012, hard of hearing, Parkinson's,  hyperparathyroidism, and narcolepsy presented initially to the hospital in 2/2 after having near syncopal episode and appeared to have been found to have concerns for bronchopneumonia versus aspiration pneumonitis.  Found to be positive for RSV.  Goals of care or not reported to be in longevity.  Speech therapy as well as cardiology following.  Declined temporary feeding tube.  Transfer requested by family.  Palliative care consulted to help assist with establishing goals of care for the patient.

## 2024-05-27 NOTE — Progress Notes (Addendum)
 "  HD#1 SUBJECTIVE:  Patient Summary: Zachary Underwood is a 89 y.o. 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   Overnight Events: None  Interim History: Patient evaluated at bedside. States he received the wrong breakfast this morning. States he was supposed to have a hard boiled egg, but received grits instead. States his coughing is about the same, he always coughs when eating/drinking. Denies chest pain. Informed patient we will get an echo today, neurology consult, dietician consult. Patient states he talked with wife and he does not want a feeding tube.   OBJECTIVE:  Vital Signs: Vitals:   05/26/24 2345 05/27/24 0044 05/27/24 0415 05/27/24 0750  BP:  99/64 (!) 143/75 (!) 141/67  Pulse:  94  60  Resp: 16   19  Temp: 97.6 F (36.4 C)  (!) 97.3 F (36.3 C) (!) 97.5 F (36.4 C)  TempSrc: Oral  Oral Oral  SpO2:   99% 98%  Weight:   63.7 kg   Height:       Supplemental O2: Nasal Cannula SpO2: 98 % O2 Flow Rate (L/min): 2 L/min  Filed Weights   05/25/24 1248 05/27/24 0415  Weight: 72.6 kg 63.7 kg    Intake/Output Summary (Last 24 hours) at 05/27/2024 0940 Last data filed at 05/26/2024 2333 Gross per 24 hour  Intake --  Output 200 ml  Net -200 ml   Net IO Since Admission: -750 mL [05/27/24 0940]  Physical Exam: General appearance: elderly male, lying in bed, no acute distress Cardio: 3/6 systolic ejection murmur; regular rate and rhythm Resp: slight labored breathing on room air; crackles heard in bilateral lower lungs Extremities: no swelling or erythema present on bilateral lower extremities  Patient Lines/Drains/Airways Status     Active Line/Drains/Airways      Name Placement date Placement time Site Days   Peripheral IV 05/25/24 20 G 1.88 Right Antecubital 05/25/24  1441  Antecubital  2            Pertinent labs and imaging:     Latest Ref Rng & Units 05/27/2024    3:16 AM 05/26/2024    4:20 AM 05/25/2024    1:59 PM  CBC  WBC 4.0 - 10.5 K/uL 4.4  4.3    Hemoglobin 13.0 - 17.0 g/dL 89.6  89.8  88.7   Hematocrit 39.0 - 52.0 % 32.3  30.8  33.0   Platelets 150 - 400 K/uL 153  159         Latest Ref Rng & Units 05/27/2024    3:16 AM 05/26/2024    4:20 AM 05/25/2024    3:52 PM  CMP  Glucose 70 - 99 mg/dL 87  85    BUN 8 - 23 mg/dL 17  24    Creatinine 9.38 - 1.24 mg/dL 9.09  8.86    Sodium 864 - 145 mmol/L 140  139    Potassium 3.5 - 5.1 mmol/L 3.7  3.6  3.5   Chloride 98 - 111 mmol/L 104  100    CO2 22 - 32 mmol/L 28  27    Calcium  8.9 - 10.3 mg/dL 8.5  8.3    Total Protein 6.5 - 8.1 g/dL  5.5    Total  Bilirubin 0.0 - 1.2 mg/dL  0.4    Alkaline Phos 38 - 126 U/L  153    AST 15 - 41 U/L  33    ALT 0 - 44 U/L  16     Phos 2.2, mg 2.8  DG Swallowing Func-Speech Pathology Result Date: 05/26/2024 Table formatting from the original result was not included. 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 His spouse reported to ED staff that he started to exhibit a decline in mental cognition around 0900 which included word finding difficulty and slurred speech. She also reported that he has had decreased oral intake/appetite for several weeks prior to this admission. In ED he was hypotensive, labs significant for slight hypokalemia, elevated alkaline phosphatase, slightly elevated anion gap, normal CO2. CXR showed evidence of mild left basilar atelectasis or infiltrate, CTH negative for acute intracranial process, CT cervical spine  demonstrated no cervical spinal fracture, however showing stable, chronic odontoid fracture and stable degenerative changes. CT Angio showing no pulmonary embolism, however findings compatible with bronchopneumonia or aspiration in the lower lungs. He was made NPO and SLP ordered for swallow evaluation. He was admitted to an outside te hospital in December of 2025 with CAP versus aspiration pneumonia and January of 2026 with concern for aspiration pnemonia. He had an MBS on 04/13/24 reporting acute on chronic, moderate/severe  pharyngeal dysphagia with silent aspiration of both thin and thickened (nectar/mildly thick/2) liquids. MBS on 05/05/24 reported a functional decline when compared to 04/13/24 MBS and NPO recommended. Other PMH: CVA in 2023, Parkinson syndrome, multiple electrolyte derangements, narcolepsy, oropharyngeal dysphagia, recent cervical fracture (C1-4 11/2023), Clinical Impression: No recommendations for PO's beyond necessary medications crushed in puree and water sips PRN. SLP will discuss MBS results with patient and spouse. 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 Penetration above the vocal cords that did not clear occured with thin, nectar thick and honey thick liquids (PAS 3), penetration to the vocal cords that did not clear (PAS 5) as well as contrast which passed below vocal cords (PAS 6) and silent aspiration (PAS 8) occured with thin liquids. Cued cough was effective to clear laryngeal vestibule of penetrate even when it had passed through the vocal cords, however penetration was persistent and reoccured from pharyngeal residuals. He exhibits partial laryngeal elevation, partial anterior hyoid movement, partial epiglottic inversion and incomplete laryngeal vestibule closure. In addition, the curvature of his cervical spine results in physical compression of the esophagus and restriction of epiglottic movement/inversion. As per reports from recent MBS' at  outside hospital in December of 2025 and January of 2026, patient's swallow function appears to be declining. Aspiration of PO's will continue to occur and patient continues to be at a heightened risk of developing bacterial aspiration pneumonia as well as decompensation from pneumonia. Factors that may increase risk of adverse event in presence of aspiration [name] 2021): Poor general health and/or compromised immunity;Respiratory or GI disease;Frail or deconditioned;Reduced saliva DIGEST Swallow Severity Rating*  Safety: 2  Efficiency:1  Overall Pharyngeal Swallow Severity: 2 1: mild; 2: moderate; 3: severe; 4: profound *The Dynamic Imaging Grade of Swallowing Toxicity is standardized for the head and neck cancer population, however, demonstrates promising clinical applications across populations to standardize the clinical rating  of pharyngeal swallow safety and severity. Recommendations/Plan: Swallowing Evaluation Recommendations Swallowing Evaluation Recommendations Recommendations: NPO except meds; Free water protocol after oral care Medication Administration: Crushed with puree Supervision: Patient able to self-feed Swallowing strategies  : Clear throat intermittently Oral care recommendations: Oral care QID (4x/day); Oral care before ice chips/water Treatment Plan Treatment Plan Treatment recommendations: Therapy as outlined in treatment plan below Follow-up recommendations: Follow physicians's recommendations for discharge plan and follow up therapies Functional status assessment: Patient has had a recent decline in their functional status and/or demonstrates limited ability to make significant improvements in function in a reasonable and predictable amount of time. Treatment frequency: Min 1x/week Treatment duration: 1 week Interventions: Aspiration precaution training; Patient/family education Recommendations Recommendations for follow up therapy are one component of a multi-disciplinary  discharge planning process, led by the attending physician.  Recommendations may be updated based on patient status, additional functional criteria and insurance authorization. Assessment: Orofacial Exam: Orofacial Exam Oral Cavity: Oral Hygiene: Xerostomia Oral Cavity - Dentition: Adequate natural dentition; Missing dentition Orofacial Anatomy: WFL Oral Motor/Sensory Function: WFL Anatomy: Anatomy: Prominent cricopharyngeus Boluses Administered: Boluses Administered Boluses Administered: Thin liquids (Level 0); Mildly thick liquids (Level 2, nectar thick); Moderately thick liquids (Level 3, honey thick); Puree; Solid  Oral Impairment Domain: Oral Impairment Domain Lip Closure: No labial escape Tongue control during bolus hold: Cohesive bolus between tongue to palatal seal Bolus preparation/mastication: Slow prolonged chewing/mashing with complete recollection Bolus transport/lingual motion: Brisk tongue motion Oral residue: Trace residue lining oral structures Location of oral residue : Palate Initiation of pharyngeal swallow : Posterior laryngeal surface of the epiglottis; Pyriform sinuses  Pharyngeal Impairment Domain: Pharyngeal Impairment Domain Soft palate elevation: No bolus between soft palate (SP)/pharyngeal wall (PW) Laryngeal elevation: Partial superior movement of thyroid cartilage/partial approximation of arytenoids to epiglottic petiole Anterior hyoid excursion: Partial anterior movement Epiglottic movement: Partial inversion Laryngeal vestibule closure: Incomplete, narrow column air/contrast in laryngeal vestibule Pharyngeal stripping wave : Present - diminished Pharyngeal contraction (A/P view only): N/A Pharyngoesophageal segment opening: Partial distention/partial duration, partial obstruction of flow Tongue base retraction: Trace column of contrast or air between tongue base and PPW Pharyngeal residue: Trace residue within or on pharyngeal structures Location of pharyngeal residue: Valleculae;  Diffuse (>3 areas); Pyriform sinuses; Pharyngeal wall; Tongue base  Esophageal Impairment Domain: Esophageal Impairment Domain Esophageal clearance upright position: Esophageal retention Pill: Pill Consistency administered: Thin liquids (Level 0); Puree Thin liquids (Level 0): Impaired (see clinical impressions) Puree: WFL Penetration/Aspiration Scale Score: Penetration/Aspiration Scale Score 1.  Material does not enter airway: Solid 2.  Material enters airway, remains ABOVE vocal cords then ejected out: Puree 3.  Material enters airway, remains ABOVE vocal cords and not ejected out: Thin liquids (Level 0); Mildly thick liquids (Level 2, nectar thick); Moderately thick liquids (Level 3, honey thick) 5.  Material enters airway, CONTACTS cords and not ejected out: Thin liquids (Level 0) 8.  Material enters airway, passes BELOW cords without attempt by patient to eject out (silent aspiration) : Thin liquids (Level 0) Compensatory Strategies: Compensatory Strategies Compensatory strategies: Yes Chin tuck: Ineffective Ineffective Chin Tuck: Thin liquid (Level 0) Other(comment): Effective Effective Other(comment): Thin liquid (Level 0) (cough effective to clear laryngeal vestibule)   General Information: No data recorded Diet Prior to this Study: NPO   Temperature : Normal   Respiratory Status: WFL   Supplemental O2: Nasal cannula   History of Recent Intubation: No  Behavior/Cognition: Alert; Cooperative; Pleasant mood Self-Feeding Abilities: Able to self-feed Baseline vocal quality/speech: Hypophonia/low  volume Volitional Cough: Able to elicit Volitional Swallow: Able to elicit Exam Limitations: No limitations Goal Planning: Prognosis for improved oropharyngeal function: Guarded Barriers to Reach Goals: Time post onset; Severity of deficits; Overall medical prognosis No data recorded Patient/Family Stated Goal: requesting RN to call his wife Consulted and agree with results and recommendations: Patient Pain: Pain  Assessment Pain Assessment: No/denies pain End of Session: Start Time:SLP Start Time (ACUTE ONLY): 1042 Stop Time: SLP Stop Time (ACUTE ONLY): 1105 Time Calculation:SLP Time Calculation (min) (ACUTE ONLY): 23 min Charges: SLP Evaluations $ SLP Speech Visit: 1 Visit SLP Evaluations $BSS Swallow: 1 Procedure $MBS Swallow: 1 Procedure SLP visit diagnosis: SLP Visit Diagnosis: Dysphagia, oropharyngeal phase (R13.12) Past Medical History: Past Medical History: Diagnosis Date  Arthritis   Cardiac arrhythmia   Hypertension   Ruptured aortic aneurysm (HCC)   Stroke (HCC) 08/23/2021 Past Surgical History: Past Surgical History: Procedure Laterality Date  open heart surgery    ruptured aortic aneurysm repair   Bonnie Rush Tarrell 05/26/2024, 1:47 PM   ASSESSMENT/PLAN:  Assessment: Principal Problem:   Syncope Active Problems:   Moderate to severe aortic stenosis   Chronic heart failure with preserved ejection fraction (HFpEF) (HCC)   Syncope and collapse   Permanent atrial fibrillation (HCC)   Parkinsonism (HCC)  Plan: #Concern for Syncope History of QT prolongation History of Narcolepsy Stable. Unclear etiology of syncopal episodes, likely multifactorial (combination of arrhythmia, valvular disease, orthostatic 2/2 decreased PO intake and overdiuresis). Has history of chronic prolonged QTc, bradycardia likely secondary to conduction disease, and atrial fibrillation. Cardiology following. Echo on 04/2024 showed mild aortic stenosis. Cards recs repeating echo today to evaluate for progression of stenosis, and 30-day heart monitor on discharge given chronic bradycardia/conduction disease. Holding diuretics given concern for overdiuresis as thi is likely contributor to repeat syncopal episodes. Repeated orthostatic vitals yesterday, which were non-concerning for orthostasis. Electrolytes have normalized.  -Monitor and optimize Mg/K levels as needed -Repeat echo today -Needs 30-day heart monitor at  discharge -Hold diuretics -CBG's monitoring -Telemetry -Avoid QTc prolonging medications -PT/OT eval -Modafinil  100 mg daily -Cardiology following, appreciate recs -daily am BMP  #Concern for Bronchopneumonia vs Aspiration Pneumonitis Stable. No leukocytosis, remains afebrile. CXR on admission demonstrated mild atelectasis or infiltrate in the left lung base. Follow-up CTA noted findings compatible with bronchopneumonia or aspiration. Patient endorses difficulty fully clearing foods/liquids since cervical fracture in Aug 2025. Appears to have had past aspiration events, SLP evals with repeated concern for aspiration. On admission, patient was started on antibiotics, however these were discontinued as patient is asymptomatic, possible viral etiology as he tested positive for RSV. SLP saw yesterday. MBS performed with recommendation for NPO, necessary oral meds crushed in puree. Wife has been insistent on patient being on full liquid diet rather than NPO. -Daily am CBC -Repeat swallow eval -Sputum cultures negative -Continuous pulse ox -Blood culturesx2 neg 1 day -Pulmonary toilet (FV, IS) -NPO   #Permanent Atrial fibrillation on Xarelto  Bradycardia Chronic, stable. Currently rate-controlled with HR to the 50s-60s off any medications, however historically has had rates down to the 30s. EKG in ED showing persistent atrial fibrillation with RBBB, which has been shown on prior EKGs. Has not been established with outpatient cardiology in [place], however wife states the patient would like to become established with a primary cardiologist.  - Cardiology following; recommend 30-day heart monitor at discharge - Telemetry - Hold home Xarelto  due to poor PO intake, possible history of xarelto  failure?.  Discussed case with pharmacy due  to eliquis  and heparin product allergies we will transition to fondaparinux  for now.   #?Parkinson Syndrome Patient had a NM brain scan at [facility]  during his January admission which suggested possible Parkinson's syndrome. At that time, neurology was consulted and the patient was started on Sinemet  10-100mg  TID. The patient's wife endorses the Sinemet  dose was increased to 20-200 mg TID, subsequently increasing the patient's drowsiness with the increased dose, and has cut back his dose to 20-200mg  BID. Spoke with Dr. Michaela of neurology, who felt given early stage of Parkinson's diagnosis, it is unlikely patient's swallowing difficulties are attributable to Parkinson's as this is typically a finding in late-stage disease.  - Restarted home Sinemet  - Recommend outpatient neurology follow-up  #History of Hyperparathyroidism PTH found to be elevated to 77 on 05/03/2024. Thyroid ultrasound showed normal appearance of thyroid gland. Patient was placed on Phospha-Neutral 250 mg TID and also taking magnesium  gluconate 500 mg BID, however at discharge from his rehab facility, patient's wife stopped his magnesium  gluconate and reduced the Phospha-Neutral to 125mg  TID. Patient was instructed to follow-up outpatient with endocrinology for a nuclear medicine scan, but has not yet had imaging. On admission, patient was hypokalemic at 3.2, now normalized. Corrected calcium  wnl. Mg 2.5. Phos 3.1. - am Phosphorus level -Trend electrolytes and replete as indicated -Will give infusion of potassium phosphate  in D5  #History of CVA  Currently stable, no residual deficits. Patient had prior stroke in 08/2021 thought to be cardioembolic secondary to Xarelto  failure. Initially, CT Head was negative for any acute intracranial process, but a MR brain demonstrated multiple small foci of acute/subacute ischemia in the anterior left parietal lobe without hemorrhage or mass effect. Also noted an old cerebellar infarct. At that time, the patient was switched to Eliquis  for anticoagulation, but developed an allergy to Eliquis  and was subsequently put back on Xarelto . Unclear  why other anticoagulation options were not considered. MRI brain unremarkable. -Hold home Atorvastatin  and Xarelto  pending SLP evaluation  #HFpEF Chronic, stable. Last echo on 04/27/2024 showing EF 65-70% with no regional wall motion abnormalities and mild left ventricular hypertrophy.  Reduced right ventricular function. Severe bilateral atrial dilation, degenerative mitral valve with regurgitation. No mitral stenosis, moderate to severe mitral annular calcification. Mild aortic regurgitation, moderate aortic stenosis. Not currently on SGLT2i (stopped previously due to hypotension). Also has an allergy to Lasix. Prior diuretic use was metolazone  x 79yrs, however recently switched to Bumex 1 mg BID. Patient denies worsening edema, orthopnea, or PND symptoms. Physical exam showed that patient was dry. - Strict Is & Os - Daily weights - Trend K/Mg, goal K > 4 and Mg > 2  #Recent Odontoid Type II Fracture (C1-C2 in 11/2023) 2/2 Fall  Chronic Cough Contributing heavily to the patient's dysphagia and coughing. After the injury, the patient was initially unable to speak, had sialorrhea. Worked with SLP extensively and regained swallowing/speaking function after 6 weeks. Has residual cough that is improved from his initial injury, but this is potentially contributing to his presentation if he has true aspiration pneumonia. Per the patient's wife, other medications to relieve the cough have not worked to help reduce his cough except low doses of Xanax  on the nights his coughing fits are excessive for the patient.  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 After discussion with the patient and his wife, there is concern with the recent change in his  mood. This has been further worsened in recent weeks by his new Parkinson's syndrome diagnosis, electrolyte imbalances, and hypersomnia history. He expresses a low mood, noting decreased appetite and loss of interest in food or things previously bringing him joy. Given his presentation, there is concern for depression playing a driving force. The patient's wife states the patient is amenable to therapy or anything to help promote well-being.  - Consider psychiatry outpatient referral at discharge given complex history  #Hypertension History of hypertension, previously taking Irbesartan . Has not been taking this medication due to prior syncopal events. Patient hypotensive on admission to 92/57. Will continue to closely monitor patient's blood pressure while admitted, however do not need to restart any antihypertensive medications at this time.   #Elevated troponins Noted on admission at 168 > 167. Similarly elevated on admission to [facility]. Denies chest pain. EKG without ischemic changes. Likely age related elevations.    #Hyperlipidemia Last lipid panel 3 weeks ago (05/03/2024) showing total cholesterol LDL 72. Given history of ruptured AAA, LDL goal <70.  On home Atorvastatin  10 mg. - Resume home Atorvastatin  when able  #Sacral Pressure Wound, Stage 1 POA Chronic. Photo documentation in chart.  - WOC consult  - Air Mattress - Turns q 2hr while awake  Chronic Conditions: Ascending Aortic Aneurysm- chronic, stable, with prior repair. Noted on CT Angio to be 5.0 cm and unchanged from prior imaging. Recommending semi-annual imaging followup and referral to cardiothoracic surgery if not previously established.  Benign Prostatic Hyperplasia- On home Flomax . Monitor for any urinary retention. Resume home Flomax  0.4 mg at bedtime.  Osteoarthritis- resume home lidocaine  patch, also takes Celebrex  but not ideal given history  Best Practice: Diet: NPO IVF: Fluids: D5-LR, Rate: 75 cc/hr x 24 hrs VTE:  Place and maintain sequential compression device Start: 05/25/24 2325 Code: Full  Disposition planning: Therapy Recs: Pending, DME: none Family Contact: [name] Hageman, to be notified. DISPO: Home  Signature:  Ashley LOISE Gravely, MD [facility], PGY-1 9:40 AM, 05/27/2024  On Call pager [phone] "

## 2024-05-28 DIAGNOSIS — E43 Unspecified severe protein-calorie malnutrition: Secondary | ICD-10-CM | POA: Insufficient documentation

## 2024-05-28 LAB — CULTURE, RESPIRATORY W GRAM STAIN: Culture: NORMAL

## 2024-05-28 LAB — GLUCOSE, CAPILLARY
Glucose-Capillary: 84 mg/dL (ref 70–99)
Glucose-Capillary: 88 mg/dL (ref 70–99)
Glucose-Capillary: 88 mg/dL (ref 70–99)
Glucose-Capillary: 91 mg/dL (ref 70–99)
Glucose-Capillary: 91 mg/dL (ref 70–99)
Glucose-Capillary: 92 mg/dL (ref 70–99)

## 2024-05-28 LAB — CBC
HCT: 35.3 % — ABNORMAL LOW (ref 39.0–52.0)
Hemoglobin: 11.3 g/dL — ABNORMAL LOW (ref 13.0–17.0)
MCH: 29 pg (ref 26.0–34.0)
MCHC: 32 g/dL (ref 30.0–36.0)
MCV: 90.5 fL (ref 80.0–100.0)
Platelets: 142 10*3/uL — ABNORMAL LOW (ref 150–400)
RBC: 3.9 MIL/uL — ABNORMAL LOW (ref 4.22–5.81)
RDW: 18.1 % — ABNORMAL HIGH (ref 11.5–15.5)
WBC: 4.8 10*3/uL (ref 4.0–10.5)
nRBC: 0 % (ref 0.0–0.2)

## 2024-05-28 LAB — MAGNESIUM: Magnesium: 2 mg/dL (ref 1.7–2.4)

## 2024-05-28 LAB — BASIC METABOLIC PANEL WITH GFR
Anion gap: 8 (ref 5–15)
BUN: 14 mg/dL (ref 8–23)
CO2: 28 mmol/L (ref 22–32)
Calcium: 8.6 mg/dL — ABNORMAL LOW (ref 8.9–10.3)
Chloride: 105 mmol/L (ref 98–111)
Creatinine, Ser: 0.87 mg/dL (ref 0.61–1.24)
GFR, Estimated: >60 mL/min
Glucose, Bld: 83 mg/dL (ref 70–99)
Potassium: 3.9 mmol/L (ref 3.5–5.1)
Sodium: 141 mmol/L (ref 135–145)

## 2024-05-28 LAB — PHOSPHORUS: Phosphorus: 2.5 mg/dL (ref 2.5–4.6)

## 2024-05-28 MED ORDER — FINASTERIDE 5 MG PO TABS
5.0000 mg | ORAL_TABLET | Freq: Every day | ORAL | Status: DC
  Administered 2024-05-28: 5 mg via ORAL
  Filled 2024-05-28: qty 1

## 2024-05-28 MED ORDER — CARBIDOPA-LEVODOPA 10-100 MG PO TABS
1.0000 | ORAL_TABLET | ORAL | Status: DC
  Administered 2024-05-28: 2 via ORAL
  Filled 2024-05-28: qty 1

## 2024-05-28 MED ORDER — MODAFINIL 100 MG PO TABS
200.0000 mg | ORAL_TABLET | Freq: Every day | ORAL | Status: DC
  Administered 2024-05-29: 200 mg via ORAL
  Filled 2024-05-28: qty 2

## 2024-05-28 MED ORDER — ENSURE PLUS HIGH PROTEIN PO LIQD
237.0000 mL | Freq: Two times a day (BID) | ORAL | Status: DC
  Administered 2024-05-28 – 2024-05-29 (×2): 237 mL via ORAL
  Filled 2024-05-28 (×3): qty 237

## 2024-05-28 MED ORDER — RIVAROXABAN 15 MG PO TABS
15.0000 mg | ORAL_TABLET | Freq: Every day | ORAL | Status: DC
  Administered 2024-05-28: 15 mg via ORAL
  Filled 2024-05-28: qty 1

## 2024-05-28 NOTE — Progress Notes (Signed)
" ° ° °  Patient seen briefly today.  He seems to be doing relatively well.  Awake alert working on the computer.  Answers questions appropriately.  No further syncopal episodes. He was [facility] to [facility] and therefore is no longer on telemetry.  Not unreasonable as we did not see any pauses.  At this point I think we can hold off on outpatient 30-day monitor and can reassess in the outpatient setting.  As noted, most likely multifactorial etiology for his syncope but mostly related to poor p.o. intake, dehydration complicated by Parkinson's and aortic stenosis.  She recommendations from yesterday) home diuretics.  Can wait to restart until outpatient.  [facility] will sign off.   Medication Recommendations: No new changes from what was discussed in note from 05/27/2024 Other recommendations (labs, testing, etc): None for cardiology.  Initial plans for 30-day monitor will be delayed until he is seen in follow-up. Follow up as an outpatient: Will arrange outpatient follow-up with either myself or APP.   SABRA Alm Clay, MD   "

## 2024-05-28 NOTE — Progress Notes (Signed)
 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    PT Comments  Pt demonstrates improvements in bed mobility, using bed rails and taking additional time without cues needed. Pt requires minA for STS to walker and benefits from B UE support on device for balance throughout session. Pt demonstrates appropriate mechanics when ambulating with walker and good safety awareness with turns. Pt navigates slightly uneven terrain in the hallway with decreased pace and CGA for balance. Pt demonstrates good static balance with walker during peri care. Seated exercises reviewed and completed with patient at end of session. Pt reports participating in PT at home and is well familiar with exercises. Pt would benefit from continued PT services focused on strength, balance, gait, and overall activity tolerance to promote safety and independence with functional mobility.     If plan is discharge home, recommend the following: A little help with walking and/or transfers;A little help with bathing/dressing/bathroom;Assistance with cooking/housework;Direct supervision/assist for medications management;Direct supervision/assist for financial management;Help with stairs or ramp for entrance;Supervision due to cognitive status;Assist for transportation   Can travel by private vehicle        Equipment Recommendations  None recommended by PT (Pt has appropriate equipment at home)    Recommendations for Other Services       Precautions / Restrictions Precautions Precautions:  Fall Recall of Precautions/Restrictions: Intact Required Braces or Orthoses: Cervical Brace Cervical Brace: Other (comment) (Pt states he only wears it at night and removes during the day, patient not wearing collar this am) Restrictions Weight Bearing Restrictions Per Provider Order: No     Mobility  Bed Mobility Overal bed mobility: Needs Assistance Bed Mobility: Supine to Sit     Supine to sit: Supervision, Used rails     General bed mobility comments: used rail to assist as needed, increased time to complete transfer. No cues needed.    Transfers Overall transfer level: Needs assistance Equipment used: Rolling walker (2 wheels) Transfers: Sit to/from Stand Sit to Stand: Contact guard assist, Min assist           General transfer comment: minA to stand from bed at lowest height, good hand placement and eccentric control to return to sitting    Ambulation/Gait Ambulation/Gait assistance: Contact guard assist, Supervision Gait Distance (Feet): 90 Feet (90'x2) Assistive device: Rolling walker (2 wheels) Gait Pattern/deviations: Step-through pattern, Decreased stride length, Narrow base of support, Trunk flexed   Gait velocity interpretation: 1.31 - 2.62 ft/sec, indicative of limited community ambulator   General Gait Details: Pt demonstrates steady gait throughout ambulation, good mechanics with walker and safe with turns. Some difficulty navigating grooves on the floor and slight decline/incline but able to do with decrased pace and CGA.   Stairs             Wheelchair Mobility     Tilt Bed    Modified Rankin (Stroke Patients Only)       Balance Overall balance assessment: Needs assistance Sitting-balance support: Feet supported, No upper extremity supported Sitting balance-Leahy Scale: Good Sitting balance - Comments: No sitting balance concerns   Standing balance  support: Bilateral upper extremity supported, During functional activity, Reliant on  assistive device for balance Standing balance-Leahy Scale: Fair Standing balance comment: reliant on external support when standing, fair balance with BUE support. Stands statically with support of walker during peri care.                            Communication Communication Communication: Impaired Factors Affecting Communication: Hearing impaired  Cognition Arousal: Alert Behavior During Therapy: Flat affect, WFL for tasks assessed/performed   PT - Cognitive impairments: No apparent impairments                         Following commands: Intact      Cueing Cueing Techniques: Verbal cues  Exercises General Exercises - Lower Extremity Ankle Circles/Pumps: AROM, Both, 10 reps, Seated Short Arc Quad: AROM, Both, 10 reps, Seated Hip ABduction/ADduction: AROM, Both, 5 reps, Seated Hip Flexion/Marching: AROM, Both, 10 reps, Seated Other Exercises Other Exercises: Shoulder shrugs 1x10 Other Exercises: Scap retractions 1x10    General Comments General comments (skin integrity, edema, etc.): VSS throughout. No new skin abnormalities noted.      Pertinent Vitals/Pain Pain Assessment Pain Assessment: No/denies pain    Home Living                          Prior Function            PT Goals (current goals can now be found in the care plan section) Acute Rehab PT Goals Patient Stated Goal: to go home PT Goal Formulation: With patient Time For Goal Achievement: 06/09/24 Potential to Achieve Goals: Good Progress towards PT goals: Progressing toward goals    Frequency    Min 2X/week      PT Plan      Co-evaluation              AM-PAC PT 6 Clicks Mobility   Outcome Measure  Help needed turning from your back to your side while in a flat bed without using bedrails?: None Help needed moving from lying on your back to sitting on the side of a flat bed without using bedrails?: None Help needed moving to and from a bed to a chair  (including a wheelchair)?: A Little Help needed standing up from a chair using your arms (e.g., wheelchair or bedside chair)?: A Little Help needed to walk in hospital room?: A Little Help needed climbing 3-5 steps with a railing? : A Lot 6 Click Score: 19    End of Session Equipment Utilized During Treatment: Gait belt Activity Tolerance: Patient tolerated treatment well Patient left: with call bell/phone within reach;in chair;with chair alarm set Nurse Communication: Mobility status PT Visit Diagnosis: Other abnormalities of gait and mobility (R26.89);Muscle weakness (generalized) (M62.81)     Time: 8949-8884 PT Time Calculation (min) (ACUTE ONLY): 25 min  Charges:    $Gait Training: 8-22 mins $Therapeutic Exercise: 8-22 mins PT General Charges $$ ACUTE PT VISIT: 1 Visit                     Sabra Morel, PT, DPT  Acute Rehabilitation Services         Office: [phone]      Sabra MARLA Morel 05/28/2024, 11:38 AM

## 2024-05-28 NOTE — Progress Notes (Signed)
 Speech Language Pathology Treatment: Dysphagia  Patient Details Name: Zachary Underwood MRN: [id number] DOB: 15-Feb-1928 Today's Date: 05/28/2024 Time: 9154-9140 SLP Time Calculation (min) (ACUTE ONLY): 14 min  Assessment / Plan / Recommendation Clinical Impression  PLAN: Continue full liquid diet.  Crush meds.  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  Reintroduced myself and explained that we had worked together during his admission to [facility] last summer.  His nurse was present; administered crushed meds; pt drank sips of his coffee/water. There were no overt s/s of aspiration. He appeared to be comfortable with eating/drinking. His voice was strong and articulation was clear.  Recommend continuing this course- full liquids- pt agrees with plan.     HPI HPI: Zachary Underwood is a 89 y.o. male who presented to the ED at [facility] on 05/25/24 from home for concerns of near syncope. His spouse reported to ED staff that he started to exhibit a decline in mental cognition around 0900 which included word finding difficulty and slurred speech. She also reported that he has had decreased oral intake/appetite for several weeks prior to this admission. In ED he was hypotensive, labs significant for slight hypokalemia, elevated alkaline phosphatase, slightly elevated anion gap, normal CO2. CXR showed evidence of mild left basilar atelectasis or infiltrate, CTH negative for acute intracranial process, CT cervical spine demonstrated no cervical spinal fracture, however showing stable, chronic odontoid fracture and stable degenerative changes. CT Angio showing no pulmonary embolism, however findings compatible with bronchopneumonia or aspiration in the lower lungs. He was made NPO and SLP ordered for swallow evaluation. He was admitted to an outside te hospital in December of 2025 with CAP versus aspiration pneumonia and January of 2026 with concern for aspiration pnemonia. He had an MBS on 04/13/24 reporting  acute on chronic, moderate/severe  pharyngeal dysphagia with silent aspiration of both thin and thickened (nectar/mildly thick/2) liquids. MBS on 05/05/24 reported a functional decline when compared to 04/13/24 MBS and NPO recommended. Other PMH: CVA in 2023, Parkinson syndrome, multiple electrolyte derangements, narcolepsy, oropharyngeal dysphagia, recent cervical fracture (C1-4 11/2023),      SLP Plan  Continue with current plan of care        Swallow Evaluation Recommendations   Recommendations: PO diet PO Diet Recommendation: Full liquid diet Liquid Administration via: Straw;Cup Medication Administration: Crushed with puree Supervision: Patient able to self-feed Oral care recommendations: Oral care QID (4x/day)     Recommendations                     Oral care QID     Dysphagia, oropharyngeal phase (R13.12)     Continue with current plan of care   Zachary Underwood L. Vona, MA CCC/SLP Clinical Specialist - Acute Care SLP Acute Rehabilitation Services Office number [phone]   Zachary Underwood  05/28/2024, 9:23 AM

## 2024-05-28 NOTE — Discharge Summary (Signed)
 Physician Discharge Summary  Zachary Underwood FMW:[id number] DOB: 17-Aug-1927 DOA: 05/25/2024  PCP: [name]  Admit date: 05/25/2024 Discharge date: 05/28/2024  Admitted From: Home Disposition: Home  Recommendations for Outpatient Follow-up:  Follow up with PCP in 1-2 weeks Follow-up with cardiology as discussed Follow-up with neurology as scheduled  Home Health: PT OT speech aid Equipment/Devices: No new equipment  Discharge Condition: Stable CODE STATUS: Full Diet recommendation: Full liquid diet as tolerated  Brief/Interim Summary: Zachary Underwood is a 89 y.o. 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   Patient admitted as above with episode of syncope likely in setting of volume depletion and dehydration secondary to overdiuresis and poor p.o. intake.  Complicated by his cardiac history including heart failure as well as aortic stenosis.  Plan to restart home regimen at discharge including diuretics, potassium supplementation.  Patient continues on full liquid diet per speech recommendations.  Initial plan for 3-day heart monitor now on hold given marked improvement and no notable abnormalities on telemetry while here.  Follow-up outpatient with PCP and neurology as well for ongoing management of home medications including Sinemet  -transition back to Xarelto  for po anticoagulation.  Resume home medications as outlined below, okay to restart diuretics per cardiology.  Discharge Diagnoses:  Principal Problem:   Syncope Active Problems:   Moderate to severe aortic stenosis   Chronic heart failure with preserved ejection fraction (HFpEF) (HCC)   Syncope and  collapse   Permanent atrial fibrillation (HCC)   Parkinsonism (HCC)   Protein-calorie malnutrition, severe  Questionable episode of syncope, resolved History of QT prolongation, stable History of Narcolepsy, stable - Syncopal event likely multifactorial in the setting of overdiuresis, volume depletion, poor p.o. intake compounded by patient's Parkinson's disease which carries a high risk of orthostatic symptoms as well as prolonged QTc and notable aortic stenosis on echo. - After rehydration and holding diuretics patient symptoms have essentially resolved - Initial plan for 30-day heart monitor now on hold given negative telemetry per cardiology -Labs at this time appear to be stabilizing, electrolytes within normal limits -Continue to advance diet per speech recommendations with full liquid diet as below.   Viral pneumonia versus aspiration pneumonia  - Without overt symptoms, noted on imaging at intake with left lung base opacity/infiltrate - RSV positive on respiratory panel -Follow-up CT consistent with either bronchopneumonia versus aspiration per radiology - Known chronic dysphagia as below, speech currently recommending full liquid diet -tolerating p.o. medications as well  Dysphagia, acute on chronic -Acute onset after recent type II odontoid fracture August 2025, difficulty speaking at that time but has since recovered -Remains high risk for aspiration as above, discussed with patient and wife in regards to appropriate p.o. intake and safety precautions to avoid further episodes of aspiration -continue to follow with speech therapy after discharge -currently cleared for a full liquid diet  Permanent Atrial fibrillation on Xarelto  Bradycardia, resolved -Patient to follow-up with outpatient cardiology as above, no indication at this time for 30-day heart monitor given resolution of symptoms as he is approaching euvolemia -Continue Xarelto , prior stroke in 2023 embolic but unclear if  cardiac in origin(afib) vs artery to artery embolization given diffuse atherosclerotic disease of bilateral ICA as noted at that  time. No further episodes or issues while on xarelto  since that time. Noted allergy to both eliquis  and heparin products.   Parkinsons disease - Per outpatient imaging at [facility]  - Placed on Sinemet  at that time with multiple changes in dose currently reported 10/100 3 times daily although patient is taking this once in the morning with 2 tablets at night per wife with moderate questionable improvement in his symptoms - Continue modafinil  at discharge - Per discussion with neurology unlikely patient swallowing difficulties are related to early stage of Parkinson's; typically this is a late stage finding - Recommend outpatient neurology follow-up as scheduled   Hyperparathyroidism -PTH elevated at last hospitalization, thyroid ultrasound unremarkable at that time -Initially placed on multiple phosphate and magnesium  medications which were subsequently discontinued and decreased per wife at home -would recommend close outpatient follow-up with primary team in regards to medication changes and recommendations - Patient would likely benefit from endocrinology evaluation outpatient, this has not been established yet - Calcium  mildly low, Phos and magnesium  within normal limits -repeat labs with PCP or endocrine in the next few weeks   Embolic CVA 2023, stable  - No current deficits - Continue Xarelto  as above given allergy to heparin and Eliquis  - Continue statin at discharge   HFpEF - Stable, euvolemic now at baseline, previously overdiuresed as above given poor p.o. intake - Okay to resume diuretics at discharge per discussion with cardiology, follow daily weights closely as discussed - Patient remains high risk for orthostatic symptoms given his comorbid conditions as above as well as aortic stenosis  Hypertension - Medications currently on hold given orthostatic  symptoms and hypotension at intake Elevated troponins -minimally elevated, without signs or symptoms of ACS, EKG reassuring, likely type II supply/demand mismatch in the setting of hypotensive event, see above    Hyperlipidemia Continue statin at discharge, repeat labs per PCP/cardiology   Sacral Pressure Wound, Stage 1 POA Chronic, stable   Ascending Aortic Aneurysm- chronic, stable, with prior repair.  Continue routine imaging with PCP versus cardiology   Benign Prostatic Hyperplasia-continue Flomax /, currently asymptomatic, no issues Osteoarthritis - stable  Discharge Instructions   Allergies as of 05/28/2024       Reactions   Bovine (beef) Protein Other (See Comments)   Patient does not eat beef   Short Ragweed Pollen Ext Other (See Comments)   Unknown    Ativan [lorazepam]    Chicken Protein    Patient does not eat chicken   Eliquis  [apixaban ] Itching   Lasix [furosemide] Itching   Other Other (See Comments), Hypertension   Fusid- Legs became very swollen   Shellfish Protein-containing Drug Products    Patient does not eat seafood   Torsemide     Tylenol [acetaminophen] Hives        Medication List     STOP taking these medications    ALPRAZolam  0.25 MG tablet Commonly known as: XANAX    traMADol  50 MG tablet Commonly known as: ULTRAM        TAKE these medications    carbidopa -levodopa  10-100 MG tablet Commonly known as: SINEMET  IR Take 1-2 tablets by mouth See admin instructions. Take 1 tablet by mouth with lunch without protein and then 2 tablets at night before bed. The timing of this medication is very important.   atorvastatin  10 MG tablet Commonly known as: LIPITOR Take 10 mg by mouth at bedtime.   bumetanide 1 MG tablet Commonly known as: BUMEX Take 1-2 mg by mouth See admin instructions. Take 1  tablet every other day and on off days take 2 tablets by mouth.   celecoxib  100 MG capsule Commonly known as: CELEBREX  Take 100 mg by mouth as  needed for mild pain (pain score 1-3) or moderate pain (pain score 4-6). What changed: Another medication with the same name was removed. Continue taking this medication, and follow the directions you see here.   clotrimazole-betamethasone cream Commonly known as: LOTRISONE Apply 1 Application topically 2 (two) times daily.   feeding supplement Liqd Take 237 mLs by mouth 2 (two) times daily between meals.   finasteride  5 MG tablet Commonly known as: PROSCAR  Take 5 mg by mouth at bedtime. Take 1 tablet by mouth every night with 1 tablet of Tamsulosin  0.4mg .   loratadine  10 MG tablet Commonly known as: CLARITIN  Take 1 tablet (10 mg total) by mouth daily as needed for allergies or rhinitis.   Mepilex Border Flex Pads Apply 1 each topically daily. Apply to neck under collar to reduce abrasions   methocarbamol  500 MG tablet Commonly known as: ROBAXIN  1/4 tab as needed for muscle aches / spasms What changed:  how much to take how to take this when to take this reasons to take this additional instructions   modafinil  200 MG tablet Commonly known as: PROVIGIL  Take 200 mg by mouth daily.   multivitamin with minerals tablet Take 0.5 tablets by mouth daily.   polyethylene glycol 17 g packet Commonly known as: MIRALAX  / GLYCOLAX  Take 17 g by mouth daily as needed for moderate constipation or mild constipation.   POTASSIUM & SODIUM PHOSPHATES PO Take 250 mg by mouth in the morning, at noon, and at bedtime.   sodium chloride  0.65 % Soln nasal spray Commonly known as: OCEAN Place 1 spray into both nostrils as needed for congestion.   tamsulosin  0.4 MG Caps capsule Commonly known as: FLOMAX  Take 0.4 mg by mouth at bedtime. Take 1 tablet by mouth every night with 1 tablet of Finasteride  5mg    triamcinolone  cream 0.1 % Commonly known as: KENALOG  Apply 1 Application topically 2 (two) times daily as needed. Mix with Eucerin cream as directed.   Xarelto  15 MG Tabs tablet Generic  drug: Rivaroxaban  Take 1 tablet (15 mg total) by mouth daily with supper.        Follow-up Information     [facility]   Why: please follow up  7-10 days  with PCP Contact information: [address] [facility] [place] [address] [phone]                Allergies[1]  Consultations: Cardiology, neurology   Procedures/Studies: ECHOCARDIOGRAM LIMITED Result Date: 05/27/2024    ECHOCARDIOGRAM LIMITED REPORT   Patient Name:   Zachary Underwood Date of Exam: 05/27/2024 Medical Rec #:  [id number]       Height:       68.0 in Accession #:    [id number]      Weight:       140.4 lb Date of Birth:  May 24, 1927       BSA:          1.758 m Patient Age:    89 years        BP:           141/67 mmHg Patient Gender: M               HR:           52 bpm. Exam Location:  Inpatient Procedure: Limited  Echo, Cardiac Doppler and Color Doppler (Both Spectral and            Color Flow Doppler were utilized during procedure). Indications:    I35.0 Nonrheumatic aortic (valve) stenosis  History:        Patient has prior history of Echocardiogram examinations, most                 recent 04/27/2024. Abnormal ECG, Stroke, Aortic Valve Disease,                 Arrythmias:Atrial Fibrillation and Bradycardia,                 Signs/Symptoms:Dizziness/Lightheadedness; Risk                 Factors:Dyslipidemia and Hypertension. Aortic stenosis.  Sonographer:    Ellouise Mose RDCS Referring Phys: [id number] ELSIE NOVAK [facility] IMPRESSIONS  1. Left ventricular ejection fraction, by estimation, is 60 to 65%. Left ventricular ejection fraction by PLAX is 65 %. The left ventricle has normal function. There is moderate concentric left ventricular hypertrophy.  2. The mitral valve is degenerative. Mild to moderate mitral valve regurgitation. At risk for mitral stenosis mitral stenosis.  3. The aortic valve is calcified. Aortic valve regurgitation is mild to moderate. Severe aortic valve stenosis. Aortic  regurgitation PHT measures 254 msec. Aortic valve area, by VTI measures 1.09 cm. Aortic valve mean gradient measures 44.0 mmHg. Aortic valve Vmax measures 4.02 m/s.  4. Aortic dilatation noted. There is dilatation of the aortic root, measuring 40 mm. FINDINGS  Left Ventricle: Left ventricular ejection fraction, by estimation, is 60 to 65%. Left ventricular ejection fraction by PLAX is 65 %. The left ventricle has normal function. There is moderate concentric left ventricular hypertrophy. Mitral Valve: The mitral valve is degenerative in appearance. Mild to moderate mitral valve regurgitation. At risk for mitral stenosis mitral valve stenosis. MV peak gradient, 7.3 mmHg. The mean mitral valve gradient is 3.0 mmHg. Aortic Valve: The aortic valve is calcified. Aortic valve regurgitation is mild to moderate. Aortic regurgitation PHT measures 254 msec. Severe aortic stenosis is present. Aortic valve mean gradient measures 44.0 mmHg. Aortic valve peak gradient measures  64.6 mmHg. Aortic valve area, by VTI measures 1.09 cm. Aorta: Aortic dilatation noted. There is dilatation of the aortic root, measuring 40 mm. Additional Comments: Spectral Doppler performed. Color Doppler performed.  LEFT VENTRICLE PLAX 2D LV EF:         Left ventricular ejection fraction by PLAX is 65 %. LVIDd:         3.70 cm LVIDs:         2.40 cm LV PW:         1.50 cm LV IVS:        1.30 cm LVOT diam:     2.30 cm LV SV:         109 LV SV Index:   62 LVOT Area:     4.15 cm  IVC IVC diam: 2.12 cm AORTIC VALVE AV Area (Vmax):    1.22 cm AV Area (Vmean):   1.01 cm AV Area (VTI):     1.09 cm AV Vmax:           401.80 cm/s AV Vmean:          295.000 cm/s AV VTI:            1.000 m AV Peak Grad:      64.6 mmHg AV Mean Grad:  44.0 mmHg LVOT Vmax:         118.00 cm/s LVOT Vmean:        71.900 cm/s LVOT VTI:          0.263 m LVOT/AV VTI ratio: 0.26 AI PHT:            254 msec  AORTA Ao Root diam: 3.55 cm MITRAL VALVE MV Area VTI:  3.24 cm   SHUNTS  MV Peak grad: 7.3 mmHg   Systemic VTI:  0.26 m MV Mean grad: 3.0 mmHg   Systemic Diam: 2.30 cm MV Vmax:      1.35 m/s MV Vmean:     74.8 cm/s Kardie Tobb DO Electronically signed by Dub Huntsman DO Signature Date/Time: 05/27/2024/2:05:37 PM    Final    DG Swallowing Func-Speech Pathology Result Date: 05/26/2024 Table formatting from the original result was not included. 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 His spouse reported to ED staff that he started to exhibit a decline in mental cognition around 0900 which included word finding difficulty and slurred speech. She also reported that he has had decreased oral intake/appetite for several weeks prior to this admission. In ED he was hypotensive, labs significant for slight hypokalemia, elevated alkaline phosphatase, slightly elevated anion gap, normal CO2. CXR showed evidence of mild left basilar atelectasis or infiltrate, CTH negative for acute intracranial process, CT cervical spine demonstrated no cervical spinal fracture, however showing stable, chronic odontoid fracture and stable degenerative changes. CT Angio showing no pulmonary embolism, however findings compatible with bronchopneumonia or aspiration in the lower lungs. He was made NPO and SLP ordered for swallow evaluation. He was admitted to an outside te hospital in December of 2025 with CAP versus aspiration pneumonia and January of 2026 with concern for aspiration pnemonia. He had an MBS on 04/13/24 reporting acute on chronic, moderate/severe  pharyngeal dysphagia with silent aspiration of both thin and thickened (nectar/mildly thick/2) liquids. MBS on 05/05/24 reported a functional decline when compared to 04/13/24 MBS and NPO recommended. Other PMH: CVA in 2023, Parkinson syndrome,  multiple electrolyte derangements, narcolepsy, oropharyngeal dysphagia, recent cervical fracture (C1-4 11/2023), Clinical Impression: No recommendations for PO's beyond necessary medications crushed in puree and water sips PRN. SLP will discuss MBS results with patient and spouse. 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 Penetration above the vocal cords that did not clear occured with thin, nectar thick and honey thick liquids (PAS 3), penetration to the vocal cords that did not clear (PAS 5) as well as contrast which passed below vocal cords (PAS 6) and silent aspiration (PAS 8) occured with thin liquids. Cued cough was effective to clear laryngeal vestibule of penetrate even when it had passed through the vocal cords, however penetration was persistent and reoccured from pharyngeal residuals. He exhibits partial laryngeal elevation, partial anterior hyoid movement, partial epiglottic inversion and incomplete laryngeal vestibule closure. In addition, the curvature of his cervical spine results in physical compression of the esophagus and restriction of epiglottic movement/inversion. As per reports from recent MBS' at outside hospital in December of 2025 and January of 2026, patient's swallow function appears to be declining. Aspiration of PO's will continue to occur and patient continues to be at a heightened risk of developing  bacterial aspiration pneumonia as well as decompensation from pneumonia. Factors that may increase risk of adverse event in presence of aspiration [name] 2021): Poor general health and/or compromised immunity;Respiratory or GI disease;Frail or deconditioned;Reduced saliva DIGEST Swallow Severity Rating*  Safety: 2  Efficiency:1  Overall Pharyngeal Swallow Severity: 2 1: mild; 2: moderate; 3: severe; 4: profound *The Dynamic Imaging Grade of Swallowing Toxicity is standardized for the head and neck cancer population, however, demonstrates  promising clinical applications across populations to standardize the clinical rating of pharyngeal swallow safety and severity. Recommendations/Plan: Swallowing Evaluation Recommendations Swallowing Evaluation Recommendations Recommendations: NPO except meds; Free water protocol after oral care Medication Administration: Crushed with puree Supervision: Patient able to self-feed Swallowing strategies  : Clear throat intermittently Oral care recommendations: Oral care QID (4x/day); Oral care before ice chips/water Treatment Plan Treatment Plan Treatment recommendations: Therapy as outlined in treatment plan below Follow-up recommendations: Follow physicians's recommendations for discharge plan and follow up therapies Functional status assessment: Patient has had a recent decline in their functional status and/or demonstrates limited ability to make significant improvements in function in a reasonable and predictable amount of time. Treatment frequency: Min 1x/week Treatment duration: 1 week Interventions: Aspiration precaution training; Patient/family education Recommendations Recommendations for follow up therapy are one component of a multi-disciplinary discharge planning process, led by the attending physician.  Recommendations may be updated based on patient status, additional functional criteria and insurance authorization. Assessment: Orofacial Exam: Orofacial Exam Oral Cavity: Oral Hygiene: Xerostomia Oral Cavity - Dentition: Adequate natural dentition; Missing dentition Orofacial Anatomy: WFL Oral Motor/Sensory Function: WFL Anatomy: Anatomy: Prominent cricopharyngeus Boluses Administered: Boluses Administered Boluses Administered: Thin liquids (Level 0); Mildly thick liquids (Level 2, nectar thick); Moderately thick liquids (Level 3, honey thick); Puree; Solid  Oral Impairment Domain: Oral Impairment Domain Lip Closure: No labial escape Tongue control during bolus hold: Cohesive bolus between tongue to  palatal seal Bolus preparation/mastication: Slow prolonged chewing/mashing with complete recollection Bolus transport/lingual motion: Brisk tongue motion Oral residue: Trace residue lining oral structures Location of oral residue : Palate Initiation of pharyngeal swallow : Posterior laryngeal surface of the epiglottis; Pyriform sinuses  Pharyngeal Impairment Domain: Pharyngeal Impairment Domain Soft palate elevation: No bolus between soft palate (SP)/pharyngeal wall (PW) Laryngeal elevation: Partial superior movement of thyroid cartilage/partial approximation of arytenoids to epiglottic petiole Anterior hyoid excursion: Partial anterior movement Epiglottic movement: Partial inversion Laryngeal vestibule closure: Incomplete, narrow column air/contrast in laryngeal vestibule Pharyngeal stripping wave : Present - diminished Pharyngeal contraction (A/P view only): N/A Pharyngoesophageal segment opening: Partial distention/partial duration, partial obstruction of flow Tongue base retraction: Trace column of contrast or air between tongue base and PPW Pharyngeal residue: Trace residue within or on pharyngeal structures Location of pharyngeal residue: Valleculae; Diffuse (>3 areas); Pyriform sinuses; Pharyngeal wall; Tongue base  Esophageal Impairment Domain: Esophageal Impairment Domain Esophageal clearance upright position: Esophageal retention Pill: Pill Consistency administered: Thin liquids (Level 0); Puree Thin liquids (Level 0): Impaired (see clinical impressions) Puree: WFL Penetration/Aspiration Scale Score: Penetration/Aspiration Scale Score 1.  Material does not enter airway: Solid 2.  Material enters airway, remains ABOVE vocal cords then ejected out: Puree 3.  Material enters airway, remains ABOVE vocal cords and not ejected out: Thin liquids (Level 0); Mildly thick liquids (Level 2, nectar thick); Moderately thick liquids (Level 3, honey thick) 5.  Material enters airway, CONTACTS cords and not ejected out:  Thin liquids (Level 0) 8.  Material enters airway, passes BELOW cords without attempt by patient to eject out (  silent aspiration) : Thin liquids (Level 0) Compensatory Strategies: Compensatory Strategies Compensatory strategies: Yes Chin tuck: Ineffective Ineffective Chin Tuck: Thin liquid (Level 0) Other(comment): Effective Effective Other(comment): Thin liquid (Level 0) (cough effective to clear laryngeal vestibule)   General Information: No data recorded Diet Prior to this Study: NPO   Temperature : Normal   Respiratory Status: WFL   Supplemental O2: Nasal cannula   History of Recent Intubation: No  Behavior/Cognition: Alert; Cooperative; Pleasant mood Self-Feeding Abilities: Able to self-feed Baseline vocal quality/speech: Hypophonia/low volume Volitional Cough: Able to elicit Volitional Swallow: Able to elicit Exam Limitations: No limitations Goal Planning: Prognosis for improved oropharyngeal function: Guarded Barriers to Reach Goals: Time post onset; Severity of deficits; Overall medical prognosis No data recorded Patient/Family Stated Goal: requesting RN to call his wife Consulted and agree with results and recommendations: Patient Pain: Pain Assessment Pain Assessment: No/denies pain End of Session: Start Time:SLP Start Time (ACUTE ONLY): 1042 Stop Time: SLP Stop Time (ACUTE ONLY): 1105 Time Calculation:SLP Time Calculation (min) (ACUTE ONLY): 23 min Charges: SLP Evaluations $ SLP Speech Visit: 1 Visit SLP Evaluations $BSS Swallow: 1 Procedure $MBS Swallow: 1 Procedure SLP visit diagnosis: SLP Visit Diagnosis: Dysphagia, oropharyngeal phase (R13.12) Past Medical History: Past Medical History: Diagnosis Date  Arthritis   Cardiac arrhythmia   Hypertension   Ruptured aortic aneurysm (HCC)   Stroke (HCC) 08/23/2021 Past Surgical History: Past Surgical History: Procedure Laterality Date  open heart surgery    ruptured aortic aneurysm repair   [facility] Tarrell 05/26/2024, 1:47 PM  MR BRAIN WO  CONTRAST Result Date: 05/26/2024 EXAM: MRI BRAIN WITHOUT CONTRAST 05/26/2024 02:06:32 AM TECHNIQUE: Multiplanar multisequence MRI of the head/brain was performed without the administration of intravenous contrast. COMPARISON: 02/26/2024. CLINICAL HISTORY: Mental status change, unknown cause. FINDINGS: BRAIN AND VENTRICLES: No acute infarct. 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 Minimal nonspecific white matter T2-weighted signal hyperintensities, which may be associated with early chronic small vessel disease or migraine headaches. Mild volume loss. No intracranial hemorrhage. No mass. No midline shift. No hydrocephalus. The sellar/suprasellar regions appear unremarkable. Normal flow voids. ORBITS: No significant abnormality. SINUSES AND MASTOIDS: No significant abnormality. BONES AND SOFT TISSUES: Normal marrow signal. Chronic fracture of the odontoid process with dorsal displacement radiopaque relative to the body of the dens with unchanged narrowing of the craniocervical junction. No soft tissue abnormality. IMPRESSION: 1. No acute intracranial abnormality. 2. Minimal nonspecific white matter T2 hyperintensities, most consistent with mild chronic small vessel ischemic change. 3. Remote left cerebellar infarct, unchanged. Electronically signed by: Franky Stanford MD 05/26/2024 02:16 AM EST RP Workstation: [id number]   CT Angio Chest PE W/Cm &/Or Wo Cm Result Date: 05/25/2024 EXAM: CTA CHEST 05/25/2024 05:24:22 PM TECHNIQUE: CTA of the chest was performed after the administration of intravenous contrast. Multiplanar reformatted images are provided for review. MIP images are provided for review. Automated exposure control, iterative reconstruction, and/or weight based adjustment of the mA/kV was utilized to reduce the radiation dose to as low as reasonably achievable. COMPARISON: CT 11/26/2023. CLINICAL HISTORY: Pulmonary embolism (PE) suspected, high probability. Multiple syncopole episodes with aphasia and  right-sided weakness. FINDINGS: PULMONARY ARTERIES: Pulmonary arteries are adequately opacified for evaluation. No acute pulmonary embolus. Main pulmonary artery is normal in caliber. MEDIASTINUM: Cardiomegaly. Coronary artery and aortic atherosclerotic calcifications. Unchanged dilation of the ascending aorta measuring 5.0 cm. Evaluation of the aorta is limited by phase of contrast. Reflux of contrast into the IVC (inferior vena cava) and hepatic veins compatible with elevated right heart  pressures. LYMPH NODES: No mediastinal, hilar or axillary lymphadenopathy. LUNGS AND PLEURA: Layering debris in the trachea. Diffuse bronchial wall thickening with associated bronchiectasis and bronchiolectasis and mucus plugs in the lower lungs. Micronodules and ground glass opacities in the left greater than right lower lobes. Findings are compatible with bronchopneumonia / aspiration. Trace left pleural effusion. No pneumothorax. UPPER ABDOMEN: Limited images of the upper abdomen are unremarkable. SOFT TISSUES AND BONES: No acute bone or soft tissue abnormality. IMPRESSION: 1. No pulmonary embolism. 2. Findings compatible with bronchopneumonia/aspiration in the lower lungs. 3. Cardiomegaly with reflux of contrast into the IVC and hepatic veins compatible with elevated right heart pressures. 4. 5.0 cm ascending aortic aneurysm, unchanged. Recommend semi-annual imaging followup by CTA or MRA and referral to cardiothoracic surgery if not already obtained. This recommendation follows 2010 ACCF/AHA/AATS/ACR/ASA/SCA/SCAI/SIR/STS/SVM Guidelines for the Diagnosis and Management of Patients With Thoracic Aortic Disease. Circulation. 2010; 121: Z733-z630. Aortic aneurysm NOS (ICD10-I71.9) Electronically signed by: Kenden Gatlin MD 05/25/2024 05:37 PM EST RP Workstation: [id number]   CT Head Wo Contrast Result Date: 05/25/2024 CLINICAL DATA:  Altered level of consciousness, multiple syncopal episodes EXAM: CT HEAD WITHOUT CONTRAST  TECHNIQUE: Contiguous axial images were obtained from the base of the skull through the vertex without intravenous contrast. RADIATION DOSE REDUCTION: This exam was performed according to the departmental dose-optimization program which includes automated exposure control, adjustment of the mA and/or kV according to patient size and/or use of iterative reconstruction technique. COMPARISON:  01/29/2024 FINDINGS: Brain: Stable diffuse cerebral cortical atrophy. Chronic small vessel ischemic changes as well as a chronic left cerebellar cortical infarct are unchanged. No evidence of acute infarct or hemorrhage. The lateral ventricles and midline structures are unremarkable. No acute extra-axial fluid collections. No mass effect. Vascular: No hyperdense vessel or unexpected calcification. 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 No acute bony abnormalities. Sinuses/Orbits: No acute finding. Other: None. IMPRESSION: 1. Stable head CT, no acute intracranial process. Electronically Signed   By: Ozell Daring M.D.   On: 05/25/2024 15:13   CT CERVICAL SPINE WO CONTRAST Result Date: 05/25/2024 CLINICAL DATA:  Multiple syncopal episodes EXAM: CT CERVICAL SPINE WITHOUT CONTRAST TECHNIQUE: Multidetector CT imaging of the cervical spine was performed without intravenous contrast. Multiplanar CT image reconstructions were also generated. RADIATION DOSE REDUCTION: This exam was performed according to the departmental dose-optimization program which includes automated exposure control, adjustment of the mA and/or kV according to patient size and/or use of iterative reconstruction technique. COMPARISON:  01/29/2024 FINDINGS: Alignment: Stable anterior displacement of the body of C2 relative to the odontoid process at the site of a chronic odontoid fracture. Otherwise alignment is anatomic. 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 No significant callus formation. There are no acute displaced cervical fractures. No destructive bony abnormalities. Soft tissues and spinal canal: No prevertebral fluid or swelling. No visible canal hematoma. Disc levels: Stable diffuse facet hypertrophy with bony fusion across the facet joints at C2-3 and C3-4. Significant facet hypertrophy at C4-5 and C5-6. Stable mild multilevel spondylosis greatest at the C5-6 and C6-7 levels. Upper chest: Airway is patent. Small focus of nodular airspace disease versus sub solid nodule within the superior segment right lower lobe reference image 89/5, measuring 8 mm in size. Stable thoracic aortic aneurysm, incompletely evaluated, measuring 4.7 cm within the distal ascending thoracic aorta, and 4.1 cm within the distal margin of the aortic arch. Assessment of the vascular lumen  cannot be performed without intravenous contrast. Other: Reconstructed images demonstrate no additional findings. IMPRESSION: 1. No acute cervical spine fracture. 2. No change in the chronic odontoid fracture seen on prior study, with stable displacement of the fracture fragments. No callus formation to suggest interval healing. 3. Stable multilevel cervical degenerative changes. 4. Nodular airspace disease versus sub solid nodule within the superior segment right lower lobe. Short interval follow-up could be considered. Electronically Signed   By: Ozell Daring M.D.   On: 05/25/2024 15:12   DG Chest 2 View Result Date: 05/25/2024 CLINICAL DATA:  Multiple syncopal episodes with aphasia and right-sided weakness. EXAM: DG CHEST 2V COMPARISON:  January 29, 2024 FINDINGS: Multiple sternal wires are present. The cardiac silhouette is mildly enlarged and unchanged in size. There is marked severity calcification and tortuosity of the thoracic aorta. Mild atelectasis and/or infiltrate is seen within the left lung base. No pleural effusion or pneumothorax is identified. Degenerative changes are present  throughout the thoracic spine. IMPRESSION: 1. Evidence of prior median sternotomy. 2. Mild left basilar atelectasis and/or infiltrate. Electronically Signed   By: Suzen Dials M.D.   On: 05/25/2024 14:31   NM BRAIN DATSCAN  TUMOR LOC INFLAM SPECT 1 DAY Result Date: 05/01/2024 EXAM: DATSCAN  04/30/2024 05:45:00 PM TECHNIQUE: Following the intravenous administration of radiotracer and oral administration of SSKI or Lugol's solution, SPECT images of the brain are obtained. RADIOPHARMACEUTICAL: 3.85 mCi I-123 Ioflupane administered at 13:40. Intravenous administration via left antecubital vein. 130 mg Iosat for thyroid blockade administered orally at 12:00. COMPARISON: None available. CLINICAL HISTORY: Patient has had unsteady gait, tremors, and memory issues. NM DaTscan : Parkinsonism, unspecified Parkinsonism type (HCC). FINDINGS: Decreased activity within the bilateral posterior striata (putamen). There is relative decreased activity in the heads of the caudate nuclei. The decreased activity is greater on the right than the left. IMPRESSION: 1. Findings suggestive of Parkinsonian syndrome pathology. 2. Parkinsonian syndromes remain a clinical diagnosis. DaTscan  aids in diagnosis but is not diagnostic of Parkinson's disease. Electronically signed by: Norleen Boxer MD MD 05/01/2024 09:08 AM EST RP Workstation: [id number]     Subjective: No acute issues or events overnight tolerating p.o. quite well at bedside today during interview, denies any dysphagia odynophagia nausea vomiting headache fevers chills chest pain or shortness of breath   Discharge Exam: Vitals:   05/28/24 1134 05/28/24 1546  BP: 132/71 112/61  Pulse: (!) 53 (!) 55  Resp: 16 18  Temp: 97.6 F (36.4 C) 98 F (36.7 C)  SpO2: 97% 100%   Vitals:   05/28/24 0500 05/28/24 0747 05/28/24 1134 05/28/24 1546  BP:  (!) 151/97 132/71 112/61  Pulse:  (!) 56 (!) 53 (!) 55  Resp:  18 16 18   Temp:  98 F (36.7 C) 97.6 F (36.4 C) 98 F  (36.7 C)  TempSrc:  Oral    SpO2:  98% 97% 100%  Weight: 64.8 kg     Height:        General: Pt is alert, awake, not in acute distress Cardiovascular: RRR, S1/S2 +, no rubs, no gallops Respiratory: CTA bilaterally, no wheezing, no rhonchi Abdominal: Soft, NT, ND, bowel sounds + Extremities: no edema, no cyanosis    The results of significant diagnostics from this hospitalization (including imaging, microbiology, ancillary and laboratory) are listed below for reference.     Microbiology: Recent Results (from the past 240 hours)  Blood culture (routine x 2)     Status: None (Preliminary result)   Collection Time: 05/25/24  1:08  PM   Specimen: BLOOD  Result Value Ref Range Status   Specimen Description BLOOD SITE NOT SPECIFIED  Final   Special Requests   Final    BOTTLES DRAWN AEROBIC ONLY Blood Culture results may not be optimal due to an inadequate volume of blood received in culture bottles   Culture   Final    NO GROWTH 3 DAYS Performed at [facility], [address], [place], [place] [address]    Report Status PENDING  Incomplete  Blood culture (routine x 2)     Status: None (Preliminary result)   Collection Time: 05/25/24  8:01 PM   Specimen: BLOOD LEFT FOREARM  Result Value Ref Range Status   Specimen Description BLOOD LEFT FOREARM  Final   Special Requests   Final    BOTTLES DRAWN AEROBIC ONLY Blood Culture results may not be optimal due to an inadequate volume of blood received in culture bottles   Culture   Final    NO GROWTH 3 DAYS Performed at [facility], [address], [place], [place] [address]    Report Status PENDING  Incomplete  Expectorated Sputum Assessment w Gram Stain, Rflx to Resp Cult     Status: None (Preliminary result)   Collection Time: 05/25/24 10:18 PM   Specimen: Expectorated Sputum  Result Value Ref Range Status   Specimen Description EXPECTORATED SPUTUM  Final   Special Requests NONE  Final   Sputum evaluation   Final     THIS SPECIMEN IS ACCEPTABLE FOR SPUTUM CULTURE Performed at [facility], [address], [place], [place] [address]    Report Status PENDING  Incomplete  Culture, Respiratory w Gram Stain     Status: None   Collection Time: 05/25/24 10:18 PM  Result Value Ref Range Status   Specimen Description EXPECTORATED SPUTUM  Final   Special Requests NONE Reflexed from [id number]  Final   Gram Stain   Final    RARE WBC PRESENT, PREDOMINANTLY PMN RARE YEAST RARE GRAM POSITIVE COCCI    Culture   Final    ABUNDANT Normal respiratory flora-no Staph aureus or Pseudomonas seen Performed at [facility], [address] [place], [place] [address]    Report Status 05/28/2024 FINAL  Final  Respiratory (~20 pathogens) panel by PCR     Status: Abnormal   Collection Time: 05/25/24 11:25 PM   Specimen: Nasopharyngeal Swab; Respiratory  Result Value Ref Range Status   Adenovirus NOT DETECTED NOT DETECTED Final   Coronavirus 229E NOT DETECTED NOT DETECTED Final    Comment: (NOTE) The Coronavirus on the Respiratory Panel, DOES NOT test for the novel  Coronavirus (2019 nCoV)    Coronavirus HKU1 NOT DETECTED NOT DETECTED Final   Coronavirus NL63 NOT DETECTED NOT DETECTED Final   Coronavirus OC43 NOT DETECTED NOT DETECTED Final   Metapneumovirus NOT DETECTED NOT DETECTED Final   Rhinovirus / Enterovirus NOT DETECTED NOT DETECTED Final   Influenza A NOT DETECTED NOT DETECTED Final   Influenza B NOT DETECTED NOT DETECTED Final   Parainfluenza Virus 1 NOT DETECTED NOT DETECTED Final   Parainfluenza Virus 2 NOT DETECTED NOT DETECTED Final   Parainfluenza Virus 3 NOT DETECTED NOT DETECTED Final   Parainfluenza Virus 4 NOT DETECTED NOT DETECTED Final   Respiratory Syncytial Virus DETECTED (A) NOT DETECTED Final   Bordetella pertussis NOT DETECTED NOT DETECTED Final   Bordetella Parapertussis NOT DETECTED NOT DETECTED Final   Chlamydophila pneumoniae NOT DETECTED  NOT DETECTED Final   Mycoplasma  pneumoniae NOT DETECTED NOT DETECTED Final    Comment: Performed at [facility], [address], [place], [place] [address]  Resp panel by RT-PCR (RSV, Flu A&B, Covid) Nasopharyngeal Swab     Status: Abnormal   Collection Time: 05/25/24 11:25 PM   Specimen: Nasopharyngeal Swab; Nasal Swab  Result Value Ref Range Status   SARS Coronavirus 2 by RT PCR NEGATIVE NEGATIVE Final   Influenza A by PCR NEGATIVE NEGATIVE Final   Influenza B by PCR NEGATIVE NEGATIVE Final    Comment: (NOTE) The Xpert Xpress SARS-CoV-2/FLU/RSV plus assay is intended as an aid in the diagnosis of influenza from Nasopharyngeal swab specimens and should not be used as a sole basis for treatment. Nasal washings and aspirates are unacceptable for Xpert Xpress SARS-CoV-2/FLU/RSV testing.  Fact Sheet for Patients: bloggercourse.com  Fact Sheet for Healthcare Providers: seriousbroker.it  This test is not yet approved or cleared by the [place]  FDA and has been authorized for detection and/or diagnosis of SARS-CoV-2 by FDA under an Emergency Use Authorization (EUA). This EUA will remain in effect (meaning this test can be used) for the duration of the COVID-19 declaration under Section 564(b)(1) of the Act, 21 U.S.C. section 360bbb-3(b)(1), unless the authorization is terminated or revoked.     Resp Syncytial Virus by PCR POSITIVE (A) NEGATIVE Final    Comment: (NOTE) Fact Sheet for Patients: bloggercourse.com  Fact Sheet for Healthcare Providers: seriousbroker.it  This test is not yet approved or cleared by the [place]  FDA and has been authorized for detection and/or diagnosis of SARS-CoV-2 by FDA under an Emergency Use Authorization (EUA). This EUA will remain in effect (meaning this test can be used) for the duration of the COVID-19 declaration under Section 564(b)(1) of the Act, 21  U.S.C. section 360bbb-3(b)(1), unless the authorization is terminated or revoked.  Performed at [facility], [address], [place], [place] [address]      Labs: BNP (last 3 results) Recent Labs    11/25/23 1249  BNP 261.0*   Basic Metabolic Panel: Recent Labs  Lab 05/25/24 1350 05/25/24 1359 05/25/24 1552 05/26/24 0420 05/27/24 0316 05/28/24 0528  NA 141 140  --  139 140 141  K 3.2* 3.2* 3.5 3.6 3.7 3.9  CL 99 99  --  100 104 105  CO2 26  --   --  27 28 28   GLUCOSE 69* 69*  --  85 87 83  BUN 30* 31*  --  24* 17 14  CREATININE 1.25* 1.40*  --  1.13 0.90 0.87  CALCIUM  8.5*  --   --  8.3* 8.5* 8.6*  MG 1.6*  --   --  2.8* 2.5* 2.0  PHOS  --   --   --  2.2* 3.1 2.5   Liver Function Tests: Recent Labs  Lab 05/25/24 1350 05/26/24 0420  AST 35 33  ALT 19 16  ALKPHOS 172* 153*  BILITOT 0.6 0.4  PROT 6.1* 5.5*  ALBUMIN 3.4* 3.0*   No results for input(s): LIPASE, AMYLASE in the last 168 hours. No results for input(s): AMMONIA in the last 168 hours. CBC: Recent Labs  Lab 05/25/24 1350 05/25/24 1359 05/26/24 0420 05/27/24 0316 05/28/24 0528  WBC 4.7  --  4.3 4.4 4.8  NEUTROABS 3.5  --  3.0  --   --   HGB 11.2* 11.2* 10.1* 10.3* 11.3*  HCT 34.6* 33.0* 30.8* 32.3* 35.3*  MCV 88.5  --  89.5 89.7 90.5  PLT 176  --  159 153 142*   Cardiac Enzymes: No results for input(s): CKTOTAL, CKMB, CKMBINDEX, TROPONINI in the last 168 hours. BNP: Invalid input(s): POCBNP CBG: Recent Labs  Lab 05/28/24 0006 05/28/24 0425 05/28/24 0743 05/28/24 1131 05/28/24 1544  GLUCAP 88 84 92 91 91   D-Dimer No results for input(s): DDIMER in the last 72 hours. Hgb A1c No results for input(s): HGBA1C in the last 72 hours. Lipid Profile No results for input(s): CHOL, HDL, LDLCALC, TRIG, CHOLHDL, LDLDIRECT in the last 72 hours. Thyroid function studies No results for input(s): TSH, T4TOTAL, T3FREE, THYROIDAB in the last 72  hours.  Invalid input(s): FREET3 Anemia work up No results for input(s): VITAMINB12, FOLATE, FERRITIN, TIBC, IRON, RETICCTPCT in the last 72 hours. Urinalysis    Component Value Date/Time   COLORURINE STRAW (A) 05/25/2024 1518   APPEARANCEUR CLEAR 05/25/2024 1518   LABSPEC 1.005 05/25/2024 1518   PHURINE 7.0 05/25/2024 1518   GLUCOSEU NEGATIVE 05/25/2024 1518   HGBUR NEGATIVE 05/25/2024 1518   BILIRUBINUR NEGATIVE 05/25/2024 1518   KETONESUR NEGATIVE 05/25/2024 1518   PROTEINUR NEGATIVE 05/25/2024 1518   NITRITE NEGATIVE 05/25/2024 1518   LEUKOCYTESUR NEGATIVE 05/25/2024 1518   Sepsis Labs Recent Labs  Lab 05/25/24 1350 05/26/24 0420 05/27/24 0316 05/28/24 0528  WBC 4.7 4.3 4.4 4.8   Microbiology Recent Results (from the past 240 hours)  Blood culture (routine x 2)     Status: None (Preliminary result)   Collection Time: 05/25/24  1:08 PM   Specimen: BLOOD  Result Value Ref Range Status   Specimen Description BLOOD SITE NOT SPECIFIED  Final   Special Requests   Final    BOTTLES DRAWN AEROBIC ONLY Blood Culture results may not be optimal due to an inadequate volume of blood received in culture bottles   Culture   Final    NO GROWTH 3 DAYS Performed at [facility], [address], [place], [place] [address]    Report Status PENDING  Incomplete  Blood culture (routine x 2)     Status: None (Preliminary result)   Collection Time: 05/25/24  8:01 PM   Specimen: BLOOD LEFT FOREARM  Result Value Ref Range Status   Specimen Description BLOOD LEFT FOREARM  Final   Special Requests   Final    BOTTLES DRAWN AEROBIC ONLY Blood Culture results may not be optimal due to an inadequate volume of blood received in culture bottles   Culture   Final    NO GROWTH 3 DAYS Performed at [facility], [address] [place], [place] [address]    Report Status PENDING  Incomplete  Expectorated Sputum Assessment w Gram Stain, Rflx to Resp Cult     Status: None  (Preliminary result)   Collection Time: 05/25/24 10:18 PM   Specimen: Expectorated Sputum  Result Value Ref Range Status   Specimen Description EXPECTORATED SPUTUM  Final   Special Requests NONE  Final   Sputum evaluation   Final    THIS SPECIMEN IS ACCEPTABLE FOR SPUTUM CULTURE Performed at [facility], [address], [place], [place] [address]    Report Status PENDING  Incomplete  Culture, Respiratory w Gram Stain     Status: None   Collection Time: 05/25/24 10:18 PM  Result Value Ref Range Status   Specimen Description EXPECTORATED SPUTUM  Final   Special Requests NONE Reflexed from [id number]  Final   Gram Stain   Final  RARE WBC PRESENT, PREDOMINANTLY PMN RARE YEAST RARE GRAM POSITIVE COCCI    Culture   Final    ABUNDANT Normal respiratory flora-no Staph aureus or Pseudomonas seen Performed at [facility], [address], [place], [place] [address]    Report Status 05/28/2024 FINAL  Final  Respiratory (~20 pathogens) panel by PCR     Status: Abnormal   Collection Time: 05/25/24 11:25 PM   Specimen: Nasopharyngeal Swab; Respiratory  Result Value Ref Range Status   Adenovirus NOT DETECTED NOT DETECTED Final   Coronavirus 229E NOT DETECTED NOT DETECTED Final    Comment: (NOTE) The Coronavirus on the Respiratory Panel, DOES NOT test for the novel  Coronavirus (2019 nCoV)    Coronavirus HKU1 NOT DETECTED NOT DETECTED Final   Coronavirus NL63 NOT DETECTED NOT DETECTED Final   Coronavirus OC43 NOT DETECTED NOT DETECTED Final   Metapneumovirus NOT DETECTED NOT DETECTED Final   Rhinovirus / Enterovirus NOT DETECTED NOT DETECTED Final   Influenza A NOT DETECTED NOT DETECTED Final   Influenza B NOT DETECTED NOT DETECTED Final   Parainfluenza Virus 1 NOT DETECTED NOT DETECTED Final   Parainfluenza Virus 2 NOT DETECTED NOT DETECTED Final   Parainfluenza Virus 3 NOT DETECTED NOT DETECTED Final   Parainfluenza Virus 4 NOT DETECTED NOT DETECTED Final   Respiratory  Syncytial Virus DETECTED (A) NOT DETECTED Final   Bordetella pertussis NOT DETECTED NOT DETECTED Final   Bordetella Parapertussis NOT DETECTED NOT DETECTED Final   Chlamydophila pneumoniae NOT DETECTED NOT DETECTED Final   Mycoplasma pneumoniae NOT DETECTED NOT DETECTED Final    Comment: Performed at [facility], [address], [place], [place] [address]  Resp panel by RT-PCR (RSV, Flu A&B, Covid) Nasopharyngeal Swab     Status: Abnormal   Collection Time: 05/25/24 11:25 PM   Specimen: Nasopharyngeal Swab; Nasal Swab  Result Value Ref Range Status   SARS Coronavirus 2 by RT PCR NEGATIVE NEGATIVE Final   Influenza A by PCR NEGATIVE NEGATIVE Final   Influenza B by PCR NEGATIVE NEGATIVE Final    Comment: (NOTE) The Xpert Xpress SARS-CoV-2/FLU/RSV plus assay is intended as an aid in the diagnosis of influenza from Nasopharyngeal swab specimens and should not be used as a sole basis for treatment. Nasal washings and aspirates are unacceptable for Xpert Xpress SARS-CoV-2/FLU/RSV testing.  Fact Sheet for Patients: bloggercourse.com  Fact Sheet for Healthcare Providers: seriousbroker.it  This test is not yet approved or cleared by the [place]  FDA and has been authorized for detection and/or diagnosis of SARS-CoV-2 by FDA under an Emergency Use Authorization (EUA). This EUA will remain in effect (meaning this test can be used) for the duration of the COVID-19 declaration under Section 564(b)(1) of the Act, 21 U.S.C. section 360bbb-3(b)(1), unless the authorization is terminated or revoked.     Resp Syncytial Virus by PCR POSITIVE (A) NEGATIVE Final    Comment: (NOTE) Fact Sheet for Patients: bloggercourse.com  Fact Sheet for Healthcare Providers: seriousbroker.it  This test is not yet approved or cleared by the [place]  FDA and has been authorized for detection  and/or diagnosis of SARS-CoV-2 by FDA under an Emergency Use Authorization (EUA). This EUA will remain in effect (meaning this test can be used) for the duration of the COVID-19 declaration under Section 564(b)(1) of the Act, 21 U.S.C. section 360bbb-3(b)(1), unless the authorization is terminated or revoked.  Performed at [facility], [address], [place], [place] [address]  Time coordinating discharge: Over 30 minutes  SIGNED:   Elsie JAYSON Montclair, DO [facility] 05/28/2024, 6:02 PM Pager   If 7PM-7AM, please contact night-coverage [email or web address]     [1]  Allergies Allergen Reactions   Bovine (Beef) Protein Other (See Comments)    Patient does not eat beef   Short Ragweed Pollen Ext Other (See Comments)    Unknown    Ativan [Lorazepam]    Chicken Protein     Patient does not eat chicken   Eliquis  [Apixaban ] Itching   Lasix [Furosemide] Itching   Other Other (See Comments) and Hypertension    Fusid- Legs became very swollen   Shellfish Protein-Containing Drug Products     Patient does not eat seafood   Torsemide     Tylenol [Acetaminophen] Hives

## 2024-05-28 NOTE — Plan of Care (Signed)
   Problem: Health Behavior/Discharge Planning: Goal: Ability to manage health-related needs will improve Outcome: Progressing   Problem: Clinical Measurements: Goal: Will remain free from infection Outcome: Progressing

## 2024-05-28 NOTE — Progress Notes (Signed)
 Pt is transferring to [facility] with hearing aids, hearing aid case, reading glasses, phone and phone charger in a pt's belonging bag. Pt also has clothes in a blue bag. Cervical Collar is on pt while being transferred.

## 2024-05-28 NOTE — TOC Initial Note (Addendum)
 Transition of Care [facility]) - Initial/Assessment Note    Patient Details  Name: Zachary Underwood MRN: [id number] Date of Birth: 21-Mar-1928  Transition of Care [facility]) CM/SW Contact:    Corean JAYSON Canary, RN Phone Number: 05/28/2024, 2:04 PM  Clinical Narrative:                 Patient presented for syncope. He lives at home with his wife ,  Spoke to Dr Carnell Sizer, the patients wife regarding discharge planning. They had [facility] for RN, PT , OT and SLP. She has been calling [facility] daily to  keep them updated. She states he has Aides they hired to care for him. He has a hospital bed, wheelchair, walker and BSC. She states she needs 1-2 days notice for discharge to line up caregivers. He will need transportation home.  Spoke with Arna from [facility]. They accept him for the above services, he will need new face to face, Dr Lue is aware. The start may not be until Sunday, as they are back logged from the weather issues.  His wife wants to ensure that he has same day services when discharged.  She is concerned about his eating swallowing, cognition and nourishment.She states she needs same day speech at home so she will know what he needs prepared. Discussed that speech saw him today and he is currently on full liquid diet, that he is agreeable to.  Transport  is another concern when discharged. He has been transported by wheelchair van in the past,but the patient is further deconditioned. He also will have to go through the garage and has 2 steps he will need help with to get into the house. Will call PTAR tomorrow. Messaged with Nursing and Provider. He was to be discharged today however with his wife's questions is being discharged tomorrow . IPCM will coordinate safe discharge and follow up with his wife.  1640 Spoke to patents wife about plans. He will discharge likely mid morning. Did tell her that [facility] would be out Sunday. She would like discharge instructions on conference call when the  patient is given them. Relayed this to Nursing PTAR will be called for patient tomorrow, please let wife know ETA  Expected Discharge Plan: Home w Home Health Services Barriers to Discharge: Transportation   Patient Goals and CMS Choice            Expected Discharge Plan and Services   Discharge Planning Services: CM Consult Post Acute Care Choice: Home Health Living arrangements for the past 2 months: Single Family Home                   DME Agency:  (Has all needed)       HH Arranged: PT, OT, RN, Speech Therapy HH Agency: [facility]     Representative spoke with at [facility] Agency: Lynette  Prior Living Arrangements/Services Living arrangements for the past 2 months: Single Family Home Lives with:: Spouse Patient language and need for interpreter reviewed:: Yes Do you feel safe going back to the place where you live?: Yes      Need for Family Participation in Patient Care: Yes (Comment) Care giver support system in place?: Yes (comment) Current home services: DME, Home OT, Home PT, Home RN, Other (comment) (home speech) Criminal Activity/Legal Involvement Pertinent to Current Situation/Hospitalization: No - Comment as needed  Activities of Daily Living      Permission Sought/Granted  Emotional Assessment       Orientation: : Oriented to Self Alcohol / Substance Use: Not Applicable Psych Involvement: No (comment)  Admission diagnosis:  Syncope and collapse [R55] Syncope [R55] Patient Active Problem List   Diagnosis Date Noted   Protein-calorie malnutrition, severe 05/28/2024   Syncope and collapse 05/26/2024   Permanent atrial fibrillation (HCC) 05/26/2024   Parkinsonism (HCC) 05/26/2024   Postural dizziness with presyncope 01/20/2024   Abrasion of neck 01/20/2024   Impaired mobility and ADLs 12/20/2023   Panic attacks 12/20/2023   Cervicalgia 12/20/2023   Sleeping difficulty 12/20/2023   Cervical spine fracture (HCC)  11/28/2023   Left knee pain 11/28/2023   Dysphagia 11/28/2023   Anemia 11/28/2023   Chronic heart failure with preserved ejection fraction (HFpEF) (HCC) 11/28/2023   Bradycardia 11/23/2023   Chronic atrial fibrillation (HCC) 11/23/2023   Chronic diastolic CHF (congestive heart failure) (HCC) 11/23/2023   Coronary artery calcification seen on CAT scan 11/23/2023   Ascending aortic aneurysm 11/23/2023   Closed displaced fracture of second cervical vertebra (HCC) 11/22/2023   History of CVA (cerebrovascular accident) 11/22/2023   AKI (acute kidney injury) 11/22/2023   Normocytic anemia 11/22/2023   Lower extremity edema 11/06/2023   TIA (transient ischemic attack) 07/24/2023   Syncope 01/28/2023   Benign enlargement of prostate 07/30/2022   Bilateral hearing loss 07/30/2022   Other abnormalities of gait and mobility 07/30/2022   Other specified problems related to psychosocial circumstances 07/30/2022   Sensorineural hearing loss, bilateral 07/30/2022   Tinnitus, bilateral 07/30/2022   Impingement syndrome of left shoulder region 06/03/2022   Mild cognitive impairment of uncertain or unknown etiology 03/27/2022   Snores 03/27/2022   Encounter for examination for normal comparison and control in clinical research program 03/27/2022   Hyperlipidemia 02/28/2022   Primary hypertension 02/28/2022   Moderate to severe aortic stenosis 02/28/2022   Stroke (HCC) 08/23/2021   Atherosclerosis of aorta 08/23/2021   Persistent atrial fibrillation [facility])    PCP:  Clinic, [name] Pharmacy:   [facility] - [place], [place] - [address] [place] [address] Phone: [phone] Fax: [phone]  [facility] - [place], [place] - [address] [place] [address] Phone: [phone] Fax: [phone]  [facility] [address] [place]  [address] Phone: [phone] Fax: [phone]     Social Drivers of Health (SDOH) Social History: SDOH Screenings   Food Insecurity: No Food Insecurity (05/26/2024)  Housing: Low Risk (05/26/2024)  Transportation Needs: No Transportation Needs (05/26/2024)  Utilities: Not At Risk (05/26/2024)  Depression (PHQ2-9): Low Risk (09/04/2021)  Financial Resource Strain: Low Risk (09/04/2021)  Social Connections: Unknown (05/27/2024)  Stress: No Stress Concern Present (05/10/2024)   Received from [facility]  Tobacco Use: Medium Risk (04/06/2024)   Received from [facility]   SDOH Interventions:     Readmission Risk Interventions    11/28/2023   12:05 PM  Readmission Risk Prevention Plan  Transportation Screening Complete  Home Care Screening Complete  Medication Review (RN CM) Complete

## 2024-05-28 NOTE — Consult Note (Signed)
 "                                               Consultation Note Date: 05/28/2024   Patient Name: Zachary Underwood  DOB: [birth year]  MRN: [id number]  Age / Sex: 89 y.o., male  PCP: [facility], [name] Referring Physician: Lue Elsie BROCKS, MD  Reason for Consultation: Establishing goals of care  HPI/Patient Profile: 89 y.o. male  with past medical history of permanent AF on Xarelto , HFpEF (EF 65-70% 04/2024), HTN, HLD, history of CVA in 08/2021 (thought embolic 2/2 failed Xarelto ), Parkinson syndrome, history of multiple electrolyte derangements, narcolepsy, and recent cervical fracture (C1-C4, 11/2023) d/t fall admitted on 05/25/2024 with near syncope. Syncope thought to be multifactorial - combination of arrythmia, valvular disease, and orthostatic secondary to decreased intake and overdiuresis. CXR on admission demonstrated mild atelectasis or infiltrate in the left lung base. Follow-up CTA noted findings compatible with bronchopneumonia or aspiration. Patient endorses difficulty fully clearing foods/liquids since cervical fracture in Aug 2025. SLP evals with concern for aspiration.Tested positive for RSV. PMT consulted to discuss GOC.   Clinical Assessment and Goals of Care: I have reviewed medical records including EPIC notes, labs and imaging, received report from Dr. Lue and RN, assessed the patient and then met with patient  to discuss diagnosis prognosis, GOC, EOL wishes, disposition and options.  I introduced Palliative Medicine as specialized medical care for people living with serious illness. It focuses on providing relief from the symptoms and stress of a serious illness. The goal is to improve quality of life for both the patient and the family.  While in the room, patient's wife called and so we all spoke together while she was on speaker phone.   Wife feels she doesn't feel there is much role for palliative involvement at this time as decisions have been addressed and patient  improved today. We do briefly discuss situation.  We review hospital events - she shares about the swallow studies and recommendations for feeding tubes however she and patient have felt like he could swallow well so they wanted to avoid feeding tubes.  She tells me they would be open to feeding tubes at some point - if patient were not interacting or very obviously not swallowing well or he himself were able to express feeling of not swallowing well - however, they just did not feel he was at the point of needing them yet and so requested they be avoided. She tells me she relies more on how the patient feels about his swallowing ability than what the swallowing tests indicate.  She tells me they are open to all medical interventions to prolong life. This includes CPR and ventilators. This includes feeding tubes if they feel they are indicated.  She does mention she wouldn't want something as aggressive as ECMO.   She tells me their medical decision making is guided by their Jewish faith and belief that life should be saved. She also tells me that once an intervention is started, it cannot be stopped. We briefly discussed how this would be handled if the patient were to be intubated and continued to require mechanical ventilation. We discuss this could become a complex issue. She agrees and tells me if that were the situation, she would involve Maren Tristan Chalet for assistance with decision making. Fortunately, this is not a  decision we are faced with at this time. Patient is stable and planning for discharge soon.   Following conversation with wife, patient and I briefly reviewed conversation. He reports everything she stated accurately reflects his thoughts.   Discussed with patient the importance of continued conversation with family and the medical providers regarding overall plan of care and treatment options, ensuring decisions are within the context of the patients values and GOCs.     Questions and concerns were addressed. The family was encouraged to call with questions or concerns.    Primary Decision Maker PATIENT joined by wife    SUMMARY OF RECOMMENDATIONS   - swallowing improved today, planning for dc soon - would be open to feeding tube in the future when patient and wife felt it were needed - to remain full code, open to CPR, understand this likely would lead to intubation - would involve Maren Tristan Chalet in the future if complex medical issues arise as their medical decisions are guided by their Jewish faith  Code Status/Advance Care Planning: Full code      Primary Diagnoses: Present on Admission:  Syncope  Chronic heart failure with preserved ejection fraction (HFpEF) (HCC)   I have reviewed the medical record, interviewed the patient and family, and examined the patient. The following aspects are pertinent.  Past Medical History:  Diagnosis Date   Arthritis    Cardiac arrhythmia    Hypertension    Ruptured aortic aneurysm (HCC)    Stroke (HCC) 08/23/2021   Social History   Socioeconomic History   Marital status: Married    Spouse name: [name] Hageman   Number of children: 4   Years of education: Not on file   Highest education level: Professional school degree (e.g., MD, DDS, DVM, JD)  Occupational History   Occupation: economist  Tobacco Use   Smoking status: Former    Types: Cigarettes   Smokeless tobacco: Never   Tobacco comments:    Former smoker 03/29/23  Vaping Use   Vaping status: Never Used  Substance and Sexual Activity   Alcohol use: Never   Drug use: Never   Sexual activity: Not Currently  Other Topics Concern   Not on file  Social History Narrative   Lives with wife   Job publishing copy    Social Drivers of Health   Tobacco Use: Medium Risk (04/06/2024)   Received from [facility]   Patient History    Smoking Tobacco Use: Former    Smokeless Tobacco Use: Never    Passive Exposure: Never  Engineer, Maintenance Strain: Low Risk (09/04/2021)   Overall Financial Resource Strain (CARDIA)    Difficulty of Paying Living Expenses: Not hard at all  Food Insecurity: No Food Insecurity (05/26/2024)   Epic    Worried About Radiation Protection Practitioner of Food in the Last Year: Never true    Ran Out of Food in the Last Year: Never true  Transportation Needs: No Transportation Needs (05/26/2024)   Epic    Lack of Transportation (Medical): No    Lack of Transportation (Non-Medical): No  Physical Activity: Not on file  Stress: No Stress Concern Present (05/10/2024)   Received from [facility] - Occupational Stress Questionnaire    Do you feel stress - tense, restless, nervous, or anxious, or unable to sleep at night because your mind is troubled all the time - these days?: Not at all  Social Connections: Unknown (05/27/2024)   Social Connection and  Isolation Panel    Frequency of Communication with Friends and Family: Twice a week    Frequency of Social Gatherings with Friends and Family: Twice a week    Attends Religious Services: Patient declined    Database Administrator or Organizations: Yes    Attends Banker Meetings: Not on file    Marital Status: Not on file  Depression (PHQ2-9): Low Risk (09/04/2021)   Depression (PHQ2-9)    PHQ-2 Score: 0  Alcohol Screen: Not on file  Housing: Low Risk (05/26/2024)   Epic    Unable to Pay for Housing in the Last Year: No    Number of Times Moved in the Last Year: 0    Homeless in the Last Year: No  Utilities: Not At Risk (05/26/2024)   Epic    Threatened with loss of utilities: No  Health Literacy: Not on file   No family history on file. Scheduled Meds:  carbidopa -levodopa   1 tablet Oral TID   feeding supplement  237 mL Oral BID BM   fondaparinux  (ARIXTRA ) injection  7.5 mg Subcutaneous Q24H   modafinil   100 mg Oral Daily   Continuous Infusions: PRN Meds:.lidocaine  Allergies[1] Review of Systems   Constitutional:  Positive for activity change.  All other systems reviewed and are negative.   Physical Exam Constitutional:      General: He is not in acute distress.    Appearance: He is ill-appearing.  Pulmonary:     Effort: Pulmonary effort is normal.  Skin:    General: Skin is warm and dry.  Neurological:     Mental Status: He is alert and oriented to person, place, and time.     Vital Signs: BP 132/71 (BP Location: Left Arm)   Pulse (!) 53   Temp 97.6 F (36.4 C)   Resp 16   Ht 5' 8 (1.727 m)   Wt 64.8 kg   SpO2 97%   BMI 21.72 kg/m  Pain Scale: 0-10   Pain Score: 0-No pain   SpO2: SpO2: 97 % O2 Device:SpO2: 97 % O2 Flow Rate: .O2 Flow Rate (L/min): 2 L/min  IO: Intake/output summary:  Intake/Output Summary (Last 24 hours) at 05/28/2024 1301 Last data filed at 05/27/2024 1800 Gross per 24 hour  Intake 120 ml  Output --  Net 120 ml    LBM: Last BM Date : 05/28/24 Baseline Weight: Weight: 72.6 kg Most recent weight: Weight: 64.8 kg       *Please note that this is a verbal dictation therefore any spelling or grammatical errors are due to the [employer] One system interpretation.   I personally spent a total of 60 minutes in the care of the patient today including preparing to see the patient, performing a medically appropriate exam/evaluation, counseling and educating, referring and communicating with other health care professionals, and documenting clinical information in the EHR.     Tobey Jama Barnacle, DNP, AGNP-C Palliative Medicine Team [phone] Pager: [phone]      [1]  Allergies Allergen Reactions   Bovine (Beef) Protein Other (See Comments)    Patient does not eat beef   Short Ragweed Pollen Ext Other (See Comments)    Unknown    Ativan [Lorazepam]    Chicken Protein     Patient does not eat chicken   Eliquis  [Apixaban ] Itching   Lasix [Furosemide] Itching   Other Other (See Comments) and Hypertension    Fusid- Legs  became very swollen   Shellfish Protein-Containing Drug  Products     Patient does not eat seafood   Torsemide     Tylenol [Acetaminophen] Hives   "

## 2024-05-29 LAB — CULTURE, BLOOD (ROUTINE X 2)
Culture: NO GROWTH
Culture: NO GROWTH

## 2024-05-29 LAB — GLUCOSE, CAPILLARY
Glucose-Capillary: 80 mg/dL (ref 70–99)
Glucose-Capillary: 84 mg/dL (ref 70–99)
Glucose-Capillary: 87 mg/dL (ref 70–99)

## 2024-05-29 MED ORDER — CARBIDOPA-LEVODOPA 10-100 MG PO TABS: 2.0000 | ORAL_TABLET | Freq: Every day | ORAL | Status: DC

## 2024-05-29 MED ORDER — CARBIDOPA-LEVODOPA 10-100 MG PO TABS
1.0000 | ORAL_TABLET | Freq: Every day | ORAL | Status: DC
  Filled 2024-05-29: qty 1

## 2024-05-29 NOTE — TOC Transition Note (Signed)
 Transition of Care [facility]) - Discharge Note  Patient Details  Name: Zachary Underwood MRN: [id number] Date of Birth: 1927/06/01  [facility]) CM/SW Contact:  Nena LITTIE Coffee, RN Phone Number: 05/29/2024, 10:15 AM  Clinical Narrative:    Pt to discharge home and resume home health services with [facility], face-to-face completed. PTAR to transport, medical necessity completed. Wife has been previously updated. CM was present when nursing staff called wife with discharge instructions and estimated time of arrival. Wife put the call on hold. Discharge packet with written summary, instructions and contact info in case questions arise once pt is home given to pt. PTAR at bedside to transport.   Final next level of care: Home w Home Health Services Barriers to Discharge: Barriers Resolved  Discharge Plan and Services Additional resources added to the After Visit Summary for     Discharge Planning Services: CM Consult Post Acute Care Choice: Home Health           DME Agency:  (Has all needed)    HH Arranged: PT, OT, RN, Speech Therapy HH Agency: [facility]  Representative spoke with at [facility] Agency: Arna  Social Drivers of Health (SDOH) Interventions SDOH Screenings   Food Insecurity: No Food Insecurity (05/26/2024)  Housing: Low Risk (05/26/2024)  Transportation Needs: No Transportation Needs (05/26/2024)  Utilities: Not At Risk (05/26/2024)  Depression (PHQ2-9): Low Risk (09/04/2021)  Financial Resource Strain: Low Risk (09/04/2021)  Social Connections: Unknown (05/27/2024)  Stress: No Stress Concern Present (05/10/2024)   Received from [facility]  Tobacco Use: Medium Risk (04/06/2024)   Received from [facility]   Readmission Risk Interventions    11/28/2023   12:05 PM  Readmission Risk Prevention Plan  Transportation Screening Complete  Home Care Screening Complete  Medication Review (RN CM) Complete

## 2024-05-29 NOTE — Progress Notes (Signed)
 PTAR at bedside, patient's wife concerned if he can get inside the house with 2 steps, PTAR transport  (3 Engineer, Maintenance (it) ), jenniffer from [facility] assured patients wife that he will be safely transported from stretcher to inside the house using patient's DME, wheelchair/walker. Patient's wife asked to be on hold because she is calling the aid.  RN have to end call, and will call her back to tend to other patients getting back from tests/procedures.

## 2024-05-29 NOTE — Progress Notes (Signed)
 RN called patient's wife to discuss AVS.

## 2024-05-29 NOTE — Progress Notes (Signed)
 RN om speaker phone to discuss AVS, patient's wife put RN on hold to speak to aid or help.  RN have to hang up and call her back to tend to patients coming back from tests/procedures. Wife's concerns/med questions was answered before.

## 2024-05-29 NOTE — Plan of Care (Signed)

## 2024-05-29 NOTE — Progress Notes (Signed)
 Patient has D/C orders and refused to wait for ride at D/C Lounge.

## 2024-05-29 NOTE — Discharge Summary (Signed)
 Physician Discharge Summary  Zachary Underwood FMW:[id number] DOB: Jan 21, 1928 DOA: 05/25/2024  PCP: [name]  Admit date: 05/25/2024 Discharge date: 05/29/2024  Admitted From: Home Disposition: Home  Recommendations for Outpatient Follow-up:  Follow up with PCP in 1-2 weeks Follow-up with cardiology as discussed Follow-up with neurology as scheduled  Home Health: PT OT speech aid Equipment/Devices: No new equipment  Discharge Condition: Stable CODE STATUS: Full Diet recommendation: Full liquid diet as tolerated  Brief/Interim Summary: Zachary Underwood is a 89 y.o. 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   Patient admitted as above with episode of syncope likely in setting of volume depletion and dehydration secondary to overdiuresis and poor p.o. intake.  Complicated by his cardiac history including heart failure as well as aortic stenosis.  Plan to restart home regimen at discharge including diuretics, potassium supplementation.  Patient continues on full liquid diet per speech recommendations.  Initial plan for 3-day heart monitor now on hold given marked improvement and no notable abnormalities on telemetry while here.  Follow-up outpatient with PCP and neurology as well for ongoing management of home medications including Sinemet  -transition back to Xarelto  for po anticoagulation.  Resume home medications as outlined below, okay to restart diuretics per cardiology.  Patient remains medically stable for discharge - unable to dispo 2/5 for logistical reasons (wife needed to set up assistance at home).  Discharge Diagnoses:  Principal Problem:   Syncope Active  Problems:   Moderate to severe aortic stenosis   Chronic heart failure with preserved ejection fraction (HFpEF) (HCC)   Syncope and collapse   Permanent atrial fibrillation (HCC)   Parkinsonism (HCC)   Protein-calorie malnutrition, severe  Questionable episode of syncope, resolved History of QT prolongation, stable History of Narcolepsy, stable - Syncopal event likely multifactorial in the setting of overdiuresis, volume depletion, poor p.o. intake compounded by patient's Parkinson's disease which carries a high risk of orthostatic symptoms as well as prolonged QTc and notable aortic stenosis on echo. - After rehydration and holding diuretics patient symptoms have essentially resolved - Initial plan for 30-day heart monitor now on hold given negative telemetry per cardiology -Labs at this time appear to be stabilizing, electrolytes within normal limits -Continue to advance diet per speech recommendations with full liquid diet as below.   Viral pneumonia versus aspiration pneumonia  - Without overt symptoms, noted on imaging at intake with left lung base opacity/infiltrate - RSV positive on respiratory panel -Follow-up CT consistent with either bronchopneumonia versus aspiration per radiology - Known chronic dysphagia as below, speech currently recommending full liquid diet -tolerating p.o. medications as well  Dysphagia, acute on chronic -Acute onset after recent type II odontoid fracture August 2025, difficulty speaking at that time but has since recovered -Remains high risk for aspiration as above, discussed with patient and wife in regards to appropriate p.o. intake and safety precautions to avoid further episodes of aspiration -continue to follow with speech therapy after discharge -currently cleared for a full liquid diet  Permanent Atrial fibrillation on Xarelto  Bradycardia, resolved -Patient to follow-up with outpatient cardiology as above, no indication at this time for 30-day  heart monitor given resolution of symptoms as he is approaching euvolemia -Continue Xarelto , prior stroke in 2023  embolic but unclear if cardiac in origin(afib) vs artery to artery embolization given diffuse atherosclerotic disease of bilateral ICA as noted at that time. No further episodes or issues while on xarelto  since that time. Noted allergy to both eliquis  and heparin products.   Parkinsons disease - Per outpatient imaging at [facility]  - Placed on Sinemet  at that time with multiple changes in dose currently reported 10/100 3 times daily although patient is taking this once in the morning with 2 tablets at night per wife with moderate questionable improvement in his symptoms - Continue modafinil  at discharge - Per discussion with neurology unlikely patient swallowing difficulties are related to early stage of Parkinson's; typically this is a late stage finding - Recommend outpatient neurology follow-up as scheduled   Hyperparathyroidism -PTH elevated at last hospitalization, thyroid ultrasound unremarkable at that time -Initially placed on multiple phosphate and magnesium  medications which were subsequently discontinued and decreased per wife at home -would recommend close outpatient follow-up with primary team in regards to medication changes and recommendations - Patient would likely benefit from endocrinology evaluation outpatient, this has not been established yet - Calcium  mildly low, Phos and magnesium  within normal limits -repeat labs with PCP or endocrine in the next few weeks   Embolic CVA 2023, stable  - No current deficits - Continue Xarelto  as above given allergy to heparin and Eliquis  - Continue statin at discharge   HFpEF - Stable, euvolemic now at baseline, previously overdiuresed as above given poor p.o. intake - Okay to resume diuretics at discharge per discussion with cardiology, follow daily weights closely as discussed - Patient remains high risk for orthostatic  symptoms given his comorbid conditions as above as well as aortic stenosis  Hypertension - Medications currently on hold given orthostatic symptoms and hypotension at intake Elevated troponins -minimally elevated, without signs or symptoms of ACS, EKG reassuring, likely type II supply/demand mismatch in the setting of hypotensive event, see above    Hyperlipidemia Continue statin at discharge, repeat labs per PCP/cardiology   Sacral Pressure Wound, Stage 1 POA Chronic, stable   Ascending Aortic Aneurysm- chronic, stable, with prior repair.  Continue routine imaging with PCP versus cardiology   Benign Prostatic Hyperplasia-continue Flomax /, currently asymptomatic, no issues Osteoarthritis - stable  Discharge Instructions  Discharge Instructions     Call MD for:  difficulty breathing, headache or visual disturbances   Complete by: As directed    Call MD for:  extreme fatigue   Complete by: As directed    Call MD for:  hives   Complete by: As directed    Call MD for:  persistant dizziness or light-headedness   Complete by: As directed    Call MD for:  persistant nausea and vomiting   Complete by: As directed    Call MD for:  severe uncontrolled pain   Complete by: As directed    Call MD for:  temperature >100.4   Complete by: As directed    Diet full liquid   Complete by: As directed    Discharge instructions   Complete by: As directed    Follow up with PCP, cardiology, and neurology as discussed. If worsening symptoms/changes to mental status occur please report back to the hospital immediately.   Increase activity slowly   Complete by: As directed       Allergies as of 05/29/2024       Reactions   Bovine (beef) Protein Other (See Comments)   Patient does not eat beef  Short Ragweed Pollen Ext Other (See Comments)   Unknown    Ativan [lorazepam]    Chicken Protein    Patient does not eat chicken   Eliquis  [apixaban ] Itching   Lasix [furosemide] Itching   Other  Other (See Comments), Hypertension   Fusid- Legs became very swollen   Shellfish Protein-containing Drug Products    Patient does not eat seafood   Torsemide     Tylenol [acetaminophen] Hives        Medication List     STOP taking these medications    ALPRAZolam  0.25 MG tablet Commonly known as: XANAX    traMADol  50 MG tablet Commonly known as: ULTRAM        TAKE these medications    carbidopa -levodopa  10-100 MG tablet Commonly known as: SINEMET  IR Take 1-2 tablets by mouth See admin instructions. Take 1 tablet by mouth with lunch without protein and then 2 tablets at night before bed. The timing of this medication is very important.   atorvastatin  10 MG tablet Commonly known as: LIPITOR Take 10 mg by mouth at bedtime.   bumetanide 1 MG tablet Commonly known as: BUMEX Take 1-2 mg by mouth See admin instructions. Take 1 tablet every other day and on off days take 2 tablets by mouth.   celecoxib  100 MG capsule Commonly known as: CELEBREX  Take 100 mg by mouth as needed for mild pain (pain score 1-3) or moderate pain (pain score 4-6). What changed: Another medication with the same name was removed. Continue taking this medication, and follow the directions you see here.   clotrimazole-betamethasone cream Commonly known as: LOTRISONE Apply 1 Application topically 2 (two) times daily.   feeding supplement Liqd Take 237 mLs by mouth 2 (two) times daily between meals.   finasteride  5 MG tablet Commonly known as: PROSCAR  Take 5 mg by mouth at bedtime. Take 1 tablet by mouth every night with 1 tablet of Tamsulosin  0.4mg .   loratadine  10 MG tablet Commonly known as: CLARITIN  Take 1 tablet (10 mg total) by mouth daily as needed for allergies or rhinitis.   Mepilex Border Flex Pads Apply 1 each topically daily. Apply to neck under collar to reduce abrasions   methocarbamol  500 MG tablet Commonly known as: ROBAXIN  1/4 tab as needed for muscle aches / spasms What  changed:  how much to take how to take this when to take this reasons to take this additional instructions   modafinil  200 MG tablet Commonly known as: PROVIGIL  Take 200 mg by mouth daily.   multivitamin with minerals tablet Take 0.5 tablets by mouth daily.   polyethylene glycol 17 g packet Commonly known as: MIRALAX  / GLYCOLAX  Take 17 g by mouth daily as needed for moderate constipation or mild constipation.   POTASSIUM & SODIUM PHOSPHATES PO Take 250 mg by mouth in the morning, at noon, and at bedtime.   sodium chloride  0.65 % Soln nasal spray Commonly known as: OCEAN Place 1 spray into both nostrils as needed for congestion.   tamsulosin  0.4 MG Caps capsule Commonly known as: FLOMAX  Take 0.4 mg by mouth at bedtime. Take 1 tablet by mouth every night with 1 tablet of Finasteride  5mg    triamcinolone  cream 0.1 % Commonly known as: KENALOG  Apply 1 Application topically 2 (two) times daily as needed. Mix with Eucerin cream as directed.   Xarelto  15 MG Tabs tablet Generic drug: Rivaroxaban  Take 1 tablet (15 mg total) by mouth daily with supper.        Follow-up Information  [facility] Follow up.   Why: please follow up  7-10 days  with PCP Contact information: [address] [facility] [place] [address] [phone]                Allergies[1]  Consultations: Cardiology, neurology   Procedures/Studies: ECHOCARDIOGRAM LIMITED Result Date: 05/27/2024    ECHOCARDIOGRAM LIMITED REPORT   Patient Name:   Zachary Underwood Date of Exam: 05/27/2024 Medical Rec #:  [id number]       Height:       68.0 in Accession #:    [id number]      Weight:       140.4 lb Date of Birth:  [birth year]       BSA:          1.758 m Patient Age:    89 years        BP:           141/67 mmHg Patient Gender: M               HR:           52 bpm. Exam Location:  Inpatient Procedure: Limited Echo, Cardiac Doppler and Color Doppler (Both Spectral and            Color  Flow Doppler were utilized during procedure). Indications:    I35.0 Nonrheumatic aortic (valve) stenosis  History:        Patient has prior history of Echocardiogram examinations, most                 recent 04/27/2024. Abnormal ECG, Stroke, Aortic Valve Disease,                 Arrythmias:Atrial Fibrillation and Bradycardia,                 Signs/Symptoms:Dizziness/Lightheadedness; Risk                 Factors:Dyslipidemia and Hypertension. Aortic stenosis.  Sonographer:    Ellouise Mose RDCS Referring Phys: [id number] ELSIE NOVAK [facility] IMPRESSIONS  1. Left ventricular ejection fraction, by estimation, is 60 to 65%. Left ventricular ejection fraction by PLAX is 65 %. The left ventricle has normal function. There is moderate concentric left ventricular hypertrophy.  2. The mitral valve is degenerative. Mild to moderate mitral valve regurgitation. At risk for mitral stenosis mitral stenosis.  3. The aortic valve is calcified. Aortic valve regurgitation is mild to moderate. Severe aortic valve stenosis. Aortic regurgitation PHT measures 254 msec. Aortic valve area, by VTI measures 1.09 cm. Aortic valve mean gradient measures 44.0 mmHg. Aortic valve Vmax measures 4.02 m/s.  4. Aortic dilatation noted. There is dilatation of the aortic root, measuring 40 mm. FINDINGS  Left Ventricle: Left ventricular ejection fraction, by estimation, is 60 to 65%. Left ventricular ejection fraction by PLAX is 65 %. The left ventricle has normal function. There is moderate concentric left ventricular hypertrophy. Mitral Valve: The mitral valve is degenerative in appearance. Mild to moderate mitral valve regurgitation. At risk for mitral stenosis mitral valve stenosis. MV peak gradient, 7.3 mmHg. The mean mitral valve gradient is 3.0 mmHg. Aortic Valve: The aortic valve is calcified. Aortic valve regurgitation is mild to moderate. Aortic regurgitation PHT measures 254 msec. Severe aortic stenosis is present. Aortic valve mean gradient measures  44.0 mmHg. Aortic valve peak gradient measures  64.6 mmHg. Aortic valve area, by VTI measures 1.09 cm. Aorta: Aortic dilatation noted. There is dilatation of the aortic root, measuring 40  mm. Additional Comments: Spectral Doppler performed. Color Doppler performed.  LEFT VENTRICLE PLAX 2D LV EF:         Left ventricular ejection fraction by PLAX is 65 %. LVIDd:         3.70 cm LVIDs:         2.40 cm LV PW:         1.50 cm LV IVS:        1.30 cm LVOT diam:     2.30 cm LV SV:         109 LV SV Index:   62 LVOT Area:     4.15 cm  IVC IVC diam: 2.12 cm AORTIC VALVE AV Area (Vmax):    1.22 cm AV Area (Vmean):   1.01 cm AV Area (VTI):     1.09 cm AV Vmax:           401.80 cm/s AV Vmean:          295.000 cm/s AV VTI:            1.000 m AV Peak Grad:      64.6 mmHg AV Mean Grad:      44.0 mmHg LVOT Vmax:         118.00 cm/s LVOT Vmean:        71.900 cm/s LVOT VTI:          0.263 m LVOT/AV VTI ratio: 0.26 AI PHT:            254 msec  AORTA Ao Root diam: 3.55 cm MITRAL VALVE MV Area VTI:  3.24 cm   SHUNTS MV Peak grad: 7.3 mmHg   Systemic VTI:  0.26 m MV Mean grad: 3.0 mmHg   Systemic Diam: 2.30 cm MV Vmax:      1.35 m/s MV Vmean:     74.8 cm/s Kardie Tobb DO Electronically signed by Dub Huntsman DO Signature Date/Time: 05/27/2024/2:05:37 PM    Final    DG Swallowing Func-Speech Pathology Result Date: 05/26/2024 Table formatting from the original result was not included. 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 His spouse reported to ED staff that he started to exhibit a decline in mental cognition around 0900 which included word finding difficulty and slurred speech. She also reported that he has had decreased oral intake/appetite for several weeks prior to this admission. In ED he was hypotensive, labs  significant for slight hypokalemia, elevated alkaline phosphatase, slightly elevated anion gap, normal CO2. CXR showed evidence of mild left basilar atelectasis or infiltrate, CTH negative for acute intracranial process, CT cervical spine demonstrated no cervical spinal fracture, however showing stable, chronic odontoid fracture and stable degenerative changes. CT Angio showing no pulmonary embolism, however findings compatible with bronchopneumonia or aspiration in the lower lungs. He was made NPO and SLP ordered for swallow evaluation. He was admitted to an outside te hospital in December of 2025 with CAP versus aspiration pneumonia and January of 2026 with concern for aspiration pnemonia. He had an MBS on 04/13/24 reporting acute on chronic, moderate/severe  pharyngeal dysphagia with silent aspiration of both thin and thickened (nectar/mildly thick/2) liquids. MBS on 05/05/24 reported a functional decline when compared to 04/13/24 MBS and NPO recommended. Other PMH: CVA in 2023, Parkinson syndrome, multiple electrolyte derangements, narcolepsy,  oropharyngeal dysphagia, recent cervical fracture (C1-4 11/2023), Clinical Impression: No recommendations for PO's beyond necessary medications crushed in puree and water sips PRN. SLP will discuss MBS results with patient and spouse. 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 Penetration above the vocal cords that did not clear occured with thin, nectar thick and honey thick liquids (PAS 3), penetration to the vocal cords that did not clear (PAS 5) as well as contrast which passed below vocal cords (PAS 6) and silent aspiration (PAS 8) occured with thin liquids. Cued cough was effective to clear laryngeal vestibule of penetrate even when it had passed through the vocal cords, however penetration was persistent and reoccured from pharyngeal residuals. He exhibits partial laryngeal elevation, partial anterior hyoid movement, partial  epiglottic inversion and incomplete laryngeal vestibule closure. In addition, the curvature of his cervical spine results in physical compression of the esophagus and restriction of epiglottic movement/inversion. As per reports from recent MBS' at outside hospital in December of 2025 and January of 2026, patient's swallow function appears to be declining. Aspiration of PO's will continue to occur and patient continues to be at a heightened risk of developing bacterial aspiration pneumonia as well as decompensation from pneumonia. Factors that may increase risk of adverse event in presence of aspiration [name] 2021): Poor general health and/or compromised immunity;Respiratory or GI disease;Frail or deconditioned;Reduced saliva DIGEST Swallow Severity Rating*  Safety: 2  Efficiency:1  Overall Pharyngeal Swallow Severity: 2 1: mild; 2: moderate; 3: severe; 4: profound *The Dynamic Imaging Grade of Swallowing Toxicity is standardized for the head and neck cancer population, however, demonstrates promising clinical applications across populations to standardize the clinical rating of pharyngeal swallow safety and severity. Recommendations/Plan: Swallowing Evaluation Recommendations Swallowing Evaluation Recommendations Recommendations: NPO except meds; Free water protocol after oral care Medication Administration: Crushed with puree Supervision: Patient able to self-feed Swallowing strategies  : Clear throat intermittently Oral care recommendations: Oral care QID (4x/day); Oral care before ice chips/water Treatment Plan Treatment Plan Treatment recommendations: Therapy as outlined in treatment plan below Follow-up recommendations: Follow physicians's recommendations for discharge plan and follow up therapies Functional status assessment: Patient has had a recent decline in their functional status and/or demonstrates limited ability to make significant improvements in function in a reasonable and predictable  amount of time. Treatment frequency: Min 1x/week Treatment duration: 1 week Interventions: Aspiration precaution training; Patient/family education Recommendations Recommendations for follow up therapy are one component of a multi-disciplinary discharge planning process, led by the attending physician.  Recommendations may be updated based on patient status, additional functional criteria and insurance authorization. Assessment: Orofacial Exam: Orofacial Exam Oral Cavity: Oral Hygiene: Xerostomia Oral Cavity - Dentition: Adequate natural dentition; Missing dentition Orofacial Anatomy: WFL Oral Motor/Sensory Function: WFL Anatomy: Anatomy: Prominent cricopharyngeus Boluses Administered: Boluses Administered Boluses Administered: Thin liquids (Level 0); Mildly thick liquids (Level 2, nectar thick); Moderately thick liquids (Level 3, honey thick); Puree; Solid  Oral Impairment Domain: Oral Impairment Domain Lip Closure: No labial escape Tongue control during bolus hold: Cohesive bolus between tongue to palatal seal Bolus preparation/mastication: Slow prolonged chewing/mashing with complete recollection Bolus transport/lingual motion: Brisk tongue motion Oral residue: Trace residue lining oral structures Location of oral residue : Palate Initiation of pharyngeal swallow : Posterior laryngeal surface of the epiglottis; Pyriform sinuses  Pharyngeal Impairment Domain: Pharyngeal Impairment Domain Soft palate elevation: No bolus between soft palate (SP)/pharyngeal wall (PW) Laryngeal elevation: Partial superior movement of thyroid cartilage/partial approximation of arytenoids to epiglottic  petiole Anterior hyoid excursion: Partial anterior movement Epiglottic movement: Partial inversion Laryngeal vestibule closure: Incomplete, narrow column air/contrast in laryngeal vestibule Pharyngeal stripping wave : Present - diminished Pharyngeal contraction (A/P view only): N/A Pharyngoesophageal segment opening: Partial  distention/partial duration, partial obstruction of flow Tongue base retraction: Trace column of contrast or air between tongue base and PPW Pharyngeal residue: Trace residue within or on pharyngeal structures Location of pharyngeal residue: Valleculae; Diffuse (>3 areas); Pyriform sinuses; Pharyngeal wall; Tongue base  Esophageal Impairment Domain: Esophageal Impairment Domain Esophageal clearance upright position: Esophageal retention Pill: Pill Consistency administered: Thin liquids (Level 0); Puree Thin liquids (Level 0): Impaired (see clinical impressions) Puree: WFL Penetration/Aspiration Scale Score: Penetration/Aspiration Scale Score 1.  Material does not enter airway: Solid 2.  Material enters airway, remains ABOVE vocal cords then ejected out: Puree 3.  Material enters airway, remains ABOVE vocal cords and not ejected out: Thin liquids (Level 0); Mildly thick liquids (Level 2, nectar thick); Moderately thick liquids (Level 3, honey thick) 5.  Material enters airway, CONTACTS cords and not ejected out: Thin liquids (Level 0) 8.  Material enters airway, passes BELOW cords without attempt by patient to eject out (silent aspiration) : Thin liquids (Level 0) Compensatory Strategies: Compensatory Strategies Compensatory strategies: Yes Chin tuck: Ineffective Ineffective Chin Tuck: Thin liquid (Level 0) Other(comment): Effective Effective Other(comment): Thin liquid (Level 0) (cough effective to clear laryngeal vestibule)   General Information: No data recorded Diet Prior to this Study: NPO   Temperature : Normal   Respiratory Status: WFL   Supplemental O2: Nasal cannula   History of Recent Intubation: No  Behavior/Cognition: Alert; Cooperative; Pleasant mood Self-Feeding Abilities: Able to self-feed Baseline vocal quality/speech: Hypophonia/low volume Volitional Cough: Able to elicit Volitional Swallow: Able to elicit Exam Limitations: No limitations Goal Planning: Prognosis for improved oropharyngeal function:  Guarded Barriers to Reach Goals: Time post onset; Severity of deficits; Overall medical prognosis No data recorded Patient/Family Stated Goal: requesting RN to call his wife Consulted and agree with results and recommendations: Patient Pain: Pain Assessment Pain Assessment: No/denies pain End of Session: Start Time:SLP Start Time (ACUTE ONLY): 1042 Stop Time: SLP Stop Time (ACUTE ONLY): 1105 Time Calculation:SLP Time Calculation (min) (ACUTE ONLY): 23 min Charges: SLP Evaluations $ SLP Speech Visit: 1 Visit SLP Evaluations $BSS Swallow: 1 Procedure $MBS Swallow: 1 Procedure SLP visit diagnosis: SLP Visit Diagnosis: Dysphagia, oropharyngeal phase (R13.12) Past Medical History: Past Medical History: Diagnosis Date  Arthritis   Cardiac arrhythmia   Hypertension   Ruptured aortic aneurysm (HCC)   Stroke (HCC) 08/23/2021 Past Surgical History: Past Surgical History: Procedure Laterality Date  open heart surgery    ruptured aortic aneurysm repair   Bonnie Rush Tarrell 05/26/2024, 1:47 PM  MR BRAIN WO CONTRAST Result Date: 05/26/2024 EXAM: MRI BRAIN WITHOUT CONTRAST 05/26/2024 02:06:32 AM TECHNIQUE: Multiplanar multisequence MRI of the head/brain was performed without the administration of intravenous contrast. COMPARISON: 02/26/2024. CLINICAL HISTORY: Mental status change, unknown cause. FINDINGS: BRAIN AND VENTRICLES: No acute infarct. 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 Minimal nonspecific white matter T2-weighted signal hyperintensities, which may be associated with early chronic small vessel disease or migraine headaches. Mild volume loss. No intracranial hemorrhage. No mass. No midline shift. No hydrocephalus. The sellar/suprasellar regions appear unremarkable. Normal flow voids. ORBITS: No significant abnormality. SINUSES AND MASTOIDS: No significant abnormality. BONES AND SOFT TISSUES: Normal marrow signal. Chronic fracture of the odontoid process with dorsal displacement radiopaque relative to the body of  the dens with unchanged narrowing of  the craniocervical junction. No soft tissue abnormality. IMPRESSION: 1. No acute intracranial abnormality. 2. Minimal nonspecific white matter T2 hyperintensities, most consistent with mild chronic small vessel ischemic change. 3. Remote left cerebellar infarct, unchanged. Electronically signed by: Franky Stanford MD 05/26/2024 02:16 AM EST RP Workstation: [id number]   CT Angio Chest PE W/Cm &/Or Wo Cm Result Date: 05/25/2024 EXAM: CTA CHEST 05/25/2024 05:24:22 PM TECHNIQUE: CTA of the chest was performed after the administration of intravenous contrast. Multiplanar reformatted images are provided for review. MIP images are provided for review. Automated exposure control, iterative reconstruction, and/or weight based adjustment of the mA/kV was utilized to reduce the radiation dose to as low as reasonably achievable. COMPARISON: CT 11/26/2023. CLINICAL HISTORY: Pulmonary embolism (PE) suspected, high probability. Multiple syncopole episodes with aphasia and right-sided weakness. FINDINGS: PULMONARY ARTERIES: Pulmonary arteries are adequately opacified for evaluation. No acute pulmonary embolus. Main pulmonary artery is normal in caliber. MEDIASTINUM: Cardiomegaly. Coronary artery and aortic atherosclerotic calcifications. Unchanged dilation of the ascending aorta measuring 5.0 cm. Evaluation of the aorta is limited by phase of contrast. Reflux of contrast into the IVC (inferior vena cava) and hepatic veins compatible with elevated right heart pressures. LYMPH NODES: No mediastinal, hilar or axillary lymphadenopathy. LUNGS AND PLEURA: Layering debris in the trachea. Diffuse bronchial wall thickening with associated bronchiectasis and bronchiolectasis and mucus plugs in the lower lungs. Micronodules and ground glass opacities in the left greater than right lower lobes. Findings are compatible with bronchopneumonia / aspiration. Trace left pleural effusion. No pneumothorax. UPPER  ABDOMEN: Limited images of the upper abdomen are unremarkable. SOFT TISSUES AND BONES: No acute bone or soft tissue abnormality. IMPRESSION: 1. No pulmonary embolism. 2. Findings compatible with bronchopneumonia/aspiration in the lower lungs. 3. Cardiomegaly with reflux of contrast into the IVC and hepatic veins compatible with elevated right heart pressures. 4. 5.0 cm ascending aortic aneurysm, unchanged. Recommend semi-annual imaging followup by CTA or MRA and referral to cardiothoracic surgery if not already obtained. This recommendation follows 2010 ACCF/AHA/AATS/ACR/ASA/SCA/SCAI/SIR/STS/SVM Guidelines for the Diagnosis and Management of Patients With Thoracic Aortic Disease. Circulation. 2010; 121: Z733-z630. Aortic aneurysm NOS (ICD10-I71.9) Electronically signed by: Charlotte Gatlin MD 05/25/2024 05:37 PM EST RP Workstation: [id number]   CT Head Wo Contrast Result Date: 05/25/2024 CLINICAL DATA:  Altered level of consciousness, multiple syncopal episodes EXAM: CT HEAD WITHOUT CONTRAST TECHNIQUE: Contiguous axial images were obtained from the base of the skull through the vertex without intravenous contrast. RADIATION DOSE REDUCTION: This exam was performed according to the departmental dose-optimization program which includes automated exposure control, adjustment of the mA and/or kV according to patient size and/or use of iterative reconstruction technique. COMPARISON:  01/29/2024 FINDINGS: Brain: Stable diffuse cerebral cortical atrophy. Chronic small vessel ischemic changes as well as a chronic left cerebellar cortical infarct are unchanged. No evidence of acute infarct or hemorrhage. The lateral ventricles and midline structures are unremarkable. No acute extra-axial fluid collections. No mass effect. Vascular: No hyperdense vessel or unexpected calcification. 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 No acute bony abnormalities. Sinuses/Orbits: No acute finding. Other: None. IMPRESSION: 1.  Stable head CT, no acute intracranial process. Electronically Signed   By: Ozell Daring M.D.   On: 05/25/2024 15:13   CT CERVICAL SPINE WO CONTRAST Result Date: 05/25/2024 CLINICAL DATA:  Multiple syncopal episodes EXAM: CT CERVICAL SPINE WITHOUT CONTRAST TECHNIQUE: Multidetector CT imaging of the cervical spine was performed without intravenous contrast. Multiplanar CT image reconstructions were also generated. RADIATION DOSE REDUCTION: This exam was performed  according to the departmental dose-optimization program which includes automated exposure control, adjustment of the mA and/or kV according to patient size and/or use of iterative reconstruction technique. COMPARISON:  01/29/2024 FINDINGS: Alignment: Stable anterior displacement of the body of C2 relative to the odontoid process at the site of a chronic odontoid fracture. Otherwise alignment is anatomic. 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 No significant callus formation. There are no acute displaced cervical fractures. No destructive bony abnormalities. Soft tissues and spinal canal: No prevertebral fluid or swelling. No visible canal hematoma. Disc levels: Stable diffuse facet hypertrophy with bony fusion across the facet joints at C2-3 and C3-4. Significant facet hypertrophy at C4-5 and C5-6. Stable mild multilevel spondylosis greatest at the C5-6 and C6-7 levels. Upper chest: Airway is patent. Small focus of nodular airspace disease versus sub solid nodule within the superior segment right lower lobe reference image 89/5, measuring 8 mm in size. Stable thoracic aortic aneurysm, incompletely evaluated, measuring 4.7 cm within the distal ascending thoracic aorta, and 4.1 cm within the distal margin of the aortic arch. Assessment of the vascular lumen cannot be performed without intravenous contrast. Other: Reconstructed images demonstrate no additional  findings. IMPRESSION: 1. No acute cervical spine fracture. 2. No change in the chronic odontoid fracture seen on prior study, with stable displacement of the fracture fragments. No callus formation to suggest interval healing. 3. Stable multilevel cervical degenerative changes. 4. Nodular airspace disease versus sub solid nodule within the superior segment right lower lobe. Short interval follow-up could be considered. Electronically Signed   By: Ozell Daring M.D.   On: 05/25/2024 15:12   DG Chest 2 View Result Date: 05/25/2024 CLINICAL DATA:  Multiple syncopal episodes with aphasia and right-sided weakness. EXAM: DG CHEST 2V COMPARISON:  January 29, 2024 FINDINGS: Multiple sternal wires are present. The cardiac silhouette is mildly enlarged and unchanged in size. There is marked severity calcification and tortuosity of the thoracic aorta. Mild atelectasis and/or infiltrate is seen within the left lung base. No pleural effusion or pneumothorax is identified. Degenerative changes are present throughout the thoracic spine. IMPRESSION: 1. Evidence of prior median sternotomy. 2. Mild left basilar atelectasis and/or infiltrate. Electronically Signed   By: Suzen Dials M.D.   On: 05/25/2024 14:31   NM BRAIN DATSCAN  TUMOR LOC INFLAM SPECT 1 DAY Result Date: 05/01/2024 EXAM: DATSCAN  04/30/2024 05:45:00 PM TECHNIQUE: Following the intravenous administration of radiotracer and oral administration of SSKI or Lugol's solution, SPECT images of the brain are obtained. RADIOPHARMACEUTICAL: 3.85 mCi I-123 Ioflupane administered at 13:40. Intravenous administration via left antecubital vein. 130 mg Iosat for thyroid blockade administered orally at 12:00. COMPARISON: None available. CLINICAL HISTORY: Patient has had unsteady gait, tremors, and memory issues. NM DaTscan : Parkinsonism, unspecified Parkinsonism type (HCC). FINDINGS: Decreased activity within the bilateral posterior striata (putamen). There is relative  decreased activity in the heads of the caudate nuclei. The decreased activity is greater on the right than the left. IMPRESSION: 1. Findings suggestive of Parkinsonian syndrome pathology. 2. Parkinsonian syndromes remain a clinical diagnosis. DaTscan  aids in diagnosis but is not diagnostic of Parkinson's disease. Electronically signed by: Norleen Boxer MD MD 05/01/2024 09:08 AM EST RP Workstation: [id number]     Subjective: No acute issues or events overnight tolerating p.o. quite well at bedside today during interview, denies any dysphagia odynophagia nausea vomiting headache fevers chills chest pain or shortness of breath   Discharge Exam: Vitals:  05/29/24 0432 05/29/24 0800  BP: (!) 137/58 (!) 151/86  Pulse: (!) 52 (!) 57  Resp: 18 18  Temp: (!) 97.5 F (36.4 C) 98.5 F (36.9 C)  SpO2: 98% 98%   Vitals:   05/28/24 2012 05/29/24 0432 05/29/24 0436 05/29/24 0800  BP: (!) 145/63 (!) 137/58  (!) 151/86  Pulse: (!) 52 (!) 52  (!) 57  Resp: 16 18  18   Temp: 98.3 F (36.8 C) (!) 97.5 F (36.4 C)  98.5 F (36.9 C)  TempSrc: Oral Oral    SpO2: 100% 98%  98%  Weight:   64.3 kg   Height:        General: Pt is alert, awake, not in acute distress Cardiovascular: RRR, S1/S2 +, no rubs, no gallops Respiratory: CTA bilaterally, no wheezing, no rhonchi Abdominal: Soft, NT, ND, bowel sounds + Extremities: no edema, no cyanosis    The results of significant diagnostics from this hospitalization (including imaging, microbiology, ancillary and laboratory) are listed below for reference.     Microbiology: Recent Results (from the past 240 hours)  Blood culture (routine x 2)     Status: None (Preliminary result)   Collection Time: 05/25/24  1:08 PM   Specimen: BLOOD  Result Value Ref Range Status   Specimen Description BLOOD SITE NOT SPECIFIED  Final   Special Requests   Final    BOTTLES DRAWN AEROBIC ONLY Blood Culture results may not be optimal due to an inadequate volume of blood  received in culture bottles   Culture   Final    NO GROWTH 4 DAYS Performed at [facility], [address], [place], [place] [address]    Report Status PENDING  Incomplete  Blood culture (routine x 2)     Status: None (Preliminary result)   Collection Time: 05/25/24  8:01 PM   Specimen: BLOOD LEFT FOREARM  Result Value Ref Range Status   Specimen Description BLOOD LEFT FOREARM  Final   Special Requests   Final    BOTTLES DRAWN AEROBIC ONLY Blood Culture results may not be optimal due to an inadequate volume of blood received in culture bottles   Culture   Final    NO GROWTH 4 DAYS Performed at [facility], [address] [place], [place] [address]    Report Status PENDING  Incomplete  Expectorated Sputum Assessment w Gram Stain, Rflx to Resp Cult     Status: None (Preliminary result)   Collection Time: 05/25/24 10:18 PM   Specimen: Expectorated Sputum  Result Value Ref Range Status   Specimen Description EXPECTORATED SPUTUM  Final   Special Requests NONE  Final   Sputum evaluation   Final    THIS SPECIMEN IS ACCEPTABLE FOR SPUTUM CULTURE Performed at [facility], [address], [place], [place] [address]    Report Status PENDING  Incomplete  Culture, Respiratory w Gram Stain     Status: None   Collection Time: 05/25/24 10:18 PM  Result Value Ref Range Status   Specimen Description EXPECTORATED SPUTUM  Final   Special Requests NONE Reflexed from [id number]  Final   Gram Stain   Final    RARE WBC PRESENT, PREDOMINANTLY PMN RARE YEAST RARE GRAM POSITIVE COCCI    Culture   Final    ABUNDANT Normal respiratory flora-no Staph aureus or Pseudomonas seen Performed at [facility], [address] [place], [place] [address]    Report Status 05/28/2024 FINAL  Final  Respiratory (~20 pathogens) panel by PCR     Status: Abnormal   Collection Time: 05/25/24 11:25 PM   Specimen: Nasopharyngeal Swab; Respiratory  Result Value Ref Range Status   Adenovirus NOT  DETECTED NOT DETECTED Final   Coronavirus 229E NOT DETECTED NOT DETECTED Final    Comment: (NOTE) The Coronavirus on the Respiratory Panel, DOES NOT test for the novel  Coronavirus (2019 nCoV)    Coronavirus HKU1 NOT DETECTED NOT DETECTED Final   Coronavirus NL63 NOT DETECTED NOT DETECTED Final   Coronavirus OC43 NOT DETECTED NOT DETECTED Final   Metapneumovirus NOT DETECTED NOT DETECTED Final   Rhinovirus / Enterovirus NOT DETECTED NOT DETECTED Final   Influenza A NOT DETECTED NOT DETECTED Final   Influenza B NOT DETECTED NOT DETECTED Final   Parainfluenza Virus 1 NOT DETECTED NOT DETECTED Final   Parainfluenza Virus 2 NOT DETECTED NOT DETECTED Final   Parainfluenza Virus 3 NOT DETECTED NOT DETECTED Final   Parainfluenza Virus 4 NOT DETECTED NOT DETECTED Final   Respiratory Syncytial Virus DETECTED (A) NOT DETECTED Final   Bordetella pertussis NOT DETECTED NOT DETECTED Final   Bordetella Parapertussis NOT DETECTED NOT DETECTED Final   Chlamydophila pneumoniae NOT DETECTED NOT DETECTED Final   Mycoplasma pneumoniae NOT DETECTED NOT DETECTED Final    Comment: Performed at [facility], [address] [place], [place] [address]  Resp panel by RT-PCR (RSV, Flu A&B, Covid) Nasopharyngeal Swab     Status: Abnormal   Collection Time: 05/25/24 11:25 PM   Specimen: Nasopharyngeal Swab; Nasal Swab  Result Value Ref Range Status   SARS Coronavirus 2 by RT PCR NEGATIVE NEGATIVE Final   Influenza A by PCR NEGATIVE NEGATIVE Final   Influenza B by PCR NEGATIVE NEGATIVE Final    Comment: (NOTE) The Xpert Xpress SARS-CoV-2/FLU/RSV plus assay is intended as an aid in the diagnosis of influenza from Nasopharyngeal swab specimens and should not be used as a sole basis for treatment. Nasal washings and aspirates are unacceptable for Xpert Xpress SARS-CoV-2/FLU/RSV testing.  Fact Sheet for Patients: bloggercourse.com  Fact Sheet for Healthcare  Providers: seriousbroker.it  This test is not yet approved or cleared by the [place]  FDA and has been authorized for detection and/or diagnosis of SARS-CoV-2 by FDA under an Emergency Use Authorization (EUA). This EUA will remain in effect (meaning this test can be used) for the duration of the COVID-19 declaration under Section 564(b)(1) of the Act, 21 U.S.C. section 360bbb-3(b)(1), unless the authorization is terminated or revoked.     Resp Syncytial Virus by PCR POSITIVE (A) NEGATIVE Final    Comment: (NOTE) Fact Sheet for Patients: bloggercourse.com  Fact Sheet for Healthcare Providers: seriousbroker.it  This test is not yet approved or cleared by the [place]  FDA and has been authorized for detection and/or diagnosis of SARS-CoV-2 by FDA under an Emergency Use Authorization (EUA). This EUA will remain in effect (meaning this test can be used) for the duration of the COVID-19 declaration under Section 564(b)(1) of the Act, 21 U.S.C. section 360bbb-3(b)(1), unless the authorization is terminated or revoked.  Performed at [facility], [address] [place], [place] [address]      Labs: BNP (last 3 results) Recent Labs    11/25/23 1249  BNP 261.0*   Basic Metabolic Panel: Recent Labs  Lab 05/25/24 1350 05/25/24 1359 05/25/24 1552 05/26/24 0420 05/27/24 0316 05/28/24 0528  NA 141 140  --  139 140 141  K 3.2* 3.2* 3.5  3.6 3.7 3.9  CL 99 99  --  100 104 105  CO2 26  --   --  27 28 28   GLUCOSE 69* 69*  --  85 87 83  BUN 30* 31*  --  24* 17 14  CREATININE 1.25* 1.40*  --  1.13 0.90 0.87  CALCIUM  8.5*  --   --  8.3* 8.5* 8.6*  MG 1.6*  --   --  2.8* 2.5* 2.0  PHOS  --   --   --  2.2* 3.1 2.5   Liver Function Tests: Recent Labs  Lab 05/25/24 1350 05/26/24 0420  AST 35 33  ALT 19 16  ALKPHOS 172* 153*  BILITOT 0.6 0.4  PROT 6.1* 5.5*  ALBUMIN 3.4* 3.0*   No  results for input(s): LIPASE, AMYLASE in the last 168 hours. No results for input(s): AMMONIA in the last 168 hours. CBC: Recent Labs  Lab 05/25/24 1350 05/25/24 1359 05/26/24 0420 05/27/24 0316 05/28/24 0528  WBC 4.7  --  4.3 4.4 4.8  NEUTROABS 3.5  --  3.0  --   --   HGB 11.2* 11.2* 10.1* 10.3* 11.3*  HCT 34.6* 33.0* 30.8* 32.3* 35.3*  MCV 88.5  --  89.5 89.7 90.5  PLT 176  --  159 153 142*   Cardiac Enzymes: No results for input(s): CKTOTAL, CKMB, CKMBINDEX, TROPONINI in the last 168 hours. BNP: Invalid input(s): POCBNP CBG: Recent Labs  Lab 05/28/24 1544 05/28/24 2010 05/29/24 0004 05/29/24 0430 05/29/24 0757  GLUCAP 91 88 84 80 87   D-Dimer No results for input(s): DDIMER in the last 72 hours. Hgb A1c No results for input(s): HGBA1C in the last 72 hours. Lipid Profile No results for input(s): CHOL, HDL, LDLCALC, TRIG, CHOLHDL, LDLDIRECT in the last 72 hours. Thyroid function studies No results for input(s): TSH, T4TOTAL, T3FREE, THYROIDAB in the last 72 hours.  Invalid input(s): FREET3 Anemia work up No results for input(s): VITAMINB12, FOLATE, FERRITIN, TIBC, IRON, RETICCTPCT in the last 72 hours. Urinalysis    Component Value Date/Time   COLORURINE STRAW (A) 05/25/2024 1518   APPEARANCEUR CLEAR 05/25/2024 1518   LABSPEC 1.005 05/25/2024 1518   PHURINE 7.0 05/25/2024 1518   GLUCOSEU NEGATIVE 05/25/2024 1518   HGBUR NEGATIVE 05/25/2024 1518   BILIRUBINUR NEGATIVE 05/25/2024 1518   KETONESUR NEGATIVE 05/25/2024 1518   PROTEINUR NEGATIVE 05/25/2024 1518   NITRITE NEGATIVE 05/25/2024 1518   LEUKOCYTESUR NEGATIVE 05/25/2024 1518   Sepsis Labs Recent Labs  Lab 05/25/24 1350 05/26/24 0420 05/27/24 0316 05/28/24 0528  WBC 4.7 4.3 4.4 4.8   Microbiology Recent Results (from the past 240 hours)  Blood culture (routine x 2)     Status: None (Preliminary result)   Collection Time: 05/25/24  1:08 PM    Specimen: BLOOD  Result Value Ref Range Status   Specimen Description BLOOD SITE NOT SPECIFIED  Final   Special Requests   Final    BOTTLES DRAWN AEROBIC ONLY Blood Culture results may not be optimal due to an inadequate volume of blood received in culture bottles   Culture   Final    NO GROWTH 4 DAYS Performed at [facility], [address], [place], [place] [address]    Report Status PENDING  Incomplete  Blood culture (routine x 2)     Status: None (Preliminary result)   Collection Time: 05/25/24  8:01 PM   Specimen: BLOOD LEFT FOREARM  Result Value Ref Range Status   Specimen Description BLOOD  LEFT FOREARM  Final   Special Requests   Final    BOTTLES DRAWN AEROBIC ONLY Blood Culture results may not be optimal due to an inadequate volume of blood received in culture bottles   Culture   Final    NO GROWTH 4 DAYS Performed at [facility], [address], [place], [place] [address]    Report Status PENDING  Incomplete  Expectorated Sputum Assessment w Gram Stain, Rflx to Resp Cult     Status: None (Preliminary result)   Collection Time: 05/25/24 10:18 PM   Specimen: Expectorated Sputum  Result Value Ref Range Status   Specimen Description EXPECTORATED SPUTUM  Final   Special Requests NONE  Final   Sputum evaluation   Final    THIS SPECIMEN IS ACCEPTABLE FOR SPUTUM CULTURE Performed at [facility], [address], [place], [place] [address]    Report Status PENDING  Incomplete  Culture, Respiratory w Gram Stain     Status: None   Collection Time: 05/25/24 10:18 PM  Result Value Ref Range Status   Specimen Description EXPECTORATED SPUTUM  Final   Special Requests NONE Reflexed from [id number]  Final   Gram Stain   Final    RARE WBC PRESENT, PREDOMINANTLY PMN RARE YEAST RARE GRAM POSITIVE COCCI    Culture   Final    ABUNDANT Normal respiratory flora-no Staph aureus or Pseudomonas seen Performed at [facility], [address] [place], [place]  [address]    Report Status 05/28/2024 FINAL  Final  Respiratory (~20 pathogens) panel by PCR     Status: Abnormal   Collection Time: 05/25/24 11:25 PM   Specimen: Nasopharyngeal Swab; Respiratory  Result Value Ref Range Status   Adenovirus NOT DETECTED NOT DETECTED Final   Coronavirus 229E NOT DETECTED NOT DETECTED Final    Comment: (NOTE) The Coronavirus on the Respiratory Panel, DOES NOT test for the novel  Coronavirus (2019 nCoV)    Coronavirus HKU1 NOT DETECTED NOT DETECTED Final   Coronavirus NL63 NOT DETECTED NOT DETECTED Final   Coronavirus OC43 NOT DETECTED NOT DETECTED Final   Metapneumovirus NOT DETECTED NOT DETECTED Final   Rhinovirus / Enterovirus NOT DETECTED NOT DETECTED Final   Influenza A NOT DETECTED NOT DETECTED Final   Influenza B NOT DETECTED NOT DETECTED Final   Parainfluenza Virus 1 NOT DETECTED NOT DETECTED Final   Parainfluenza Virus 2 NOT DETECTED NOT DETECTED Final   Parainfluenza Virus 3 NOT DETECTED NOT DETECTED Final   Parainfluenza Virus 4 NOT DETECTED NOT DETECTED Final   Respiratory Syncytial Virus DETECTED (A) NOT DETECTED Final   Bordetella pertussis NOT DETECTED NOT DETECTED Final   Bordetella Parapertussis NOT DETECTED NOT DETECTED Final   Chlamydophila pneumoniae NOT DETECTED NOT DETECTED Final   Mycoplasma pneumoniae NOT DETECTED NOT DETECTED Final    Comment: Performed at [facility], [address] [place], [place] [address]  Resp panel by RT-PCR (RSV, Flu A&B, Covid) Nasopharyngeal Swab     Status: Abnormal   Collection Time: 05/25/24 11:25 PM   Specimen: Nasopharyngeal Swab; Nasal Swab  Result Value Ref Range Status   SARS Coronavirus 2 by RT PCR NEGATIVE NEGATIVE Final   Influenza A by PCR NEGATIVE NEGATIVE Final   Influenza B by PCR NEGATIVE NEGATIVE Final    Comment: (NOTE) The Xpert Xpress SARS-CoV-2/FLU/RSV plus assay is intended as an aid in the diagnosis of influenza from Nasopharyngeal swab specimens and should not be used  as a sole basis for treatment. Nasal washings and aspirates are unacceptable for Xpert Xpress SARS-CoV-2/FLU/RSV testing.  Fact Sheet for Patients: bloggercourse.com  Fact Sheet for Healthcare Providers: seriousbroker.it  This test is not yet approved or cleared by the [place]  FDA and has been authorized for detection and/or diagnosis of SARS-CoV-2 by FDA under an Emergency Use Authorization (EUA). This EUA will remain in effect (meaning this test can be used) for the duration of the COVID-19 declaration under Section 564(b)(1) of the Act, 21 U.S.C. section 360bbb-3(b)(1), unless the authorization is terminated or revoked.     Resp Syncytial Virus by PCR POSITIVE (A) NEGATIVE Final    Comment: (NOTE) Fact Sheet for Patients: bloggercourse.com  Fact Sheet for Healthcare Providers: seriousbroker.it  This test is not yet approved or cleared by the [place]  FDA and has been authorized for detection and/or diagnosis of SARS-CoV-2 by FDA under an Emergency Use Authorization (EUA). This EUA will remain in effect (meaning this test can be used) for the duration of the COVID-19 declaration under Section 564(b)(1) of the Act, 21 U.S.C. section 360bbb-3(b)(1), unless the authorization is terminated or revoked.  Performed at [facility], [address], [place], [place] [address]      Time coordinating discharge: Over 30 minutes  SIGNED:   Elsie JAYSON Montclair, DO [facility] 05/29/2024, 8:17 AM Pager   If 7PM-7AM, please contact night-coverage [email or web address]      [1]  Allergies Allergen Reactions   Bovine (Beef) Protein Other (See Comments)    Patient does not eat beef   Short Ragweed Pollen Ext Other (See Comments)    Unknown    Ativan [Lorazepam]    Chicken Protein     Patient does not eat chicken   Eliquis  [Apixaban ] Itching   Lasix  [Furosemide] Itching   Other Other (See Comments) and Hypertension    Fusid- Legs became very swollen   Shellfish Protein-Containing Drug Products     Patient does not eat seafood   Torsemide     Tylenol [Acetaminophen] Hives

## 2024-06-17 ENCOUNTER — Ambulatory Visit: Payer: PRIVATE HEALTH INSURANCE | Admitting: Cardiology
